# Patient Record
Sex: Male | Born: 1935 | Race: White | Hispanic: No | Marital: Single | Smoking: Former smoker
Health system: Southern US, Community
[De-identification: ages and names within clinical notes are randomized; demographics above are authoritative.]

## PROBLEM LIST (undated history)

## (undated) DIAGNOSIS — L129 Pemphigoid, unspecified: Secondary | ICD-10-CM

## (undated) DIAGNOSIS — F419 Anxiety disorder, unspecified: Secondary | ICD-10-CM

## (undated) DIAGNOSIS — Z9889 Other specified postprocedural states: Secondary | ICD-10-CM

## (undated) DIAGNOSIS — M1990 Unspecified osteoarthritis, unspecified site: Secondary | ICD-10-CM

## (undated) DIAGNOSIS — R600 Localized edema: Secondary | ICD-10-CM

## (undated) DIAGNOSIS — R112 Nausea with vomiting, unspecified: Secondary | ICD-10-CM

## (undated) DIAGNOSIS — R002 Palpitations: Secondary | ICD-10-CM

## (undated) DIAGNOSIS — I10 Essential (primary) hypertension: Secondary | ICD-10-CM

## (undated) DIAGNOSIS — I499 Cardiac arrhythmia, unspecified: Secondary | ICD-10-CM

## (undated) DIAGNOSIS — I493 Ventricular premature depolarization: Secondary | ICD-10-CM

## (undated) DIAGNOSIS — M4800 Spinal stenosis, site unspecified: Secondary | ICD-10-CM

## (undated) DIAGNOSIS — N400 Enlarged prostate without lower urinary tract symptoms: Secondary | ICD-10-CM

## (undated) DIAGNOSIS — M47816 Spondylosis without myelopathy or radiculopathy, lumbar region: Secondary | ICD-10-CM

## (undated) HISTORY — DX: Benign prostatic hyperplasia without lower urinary tract symptoms: N40.0

## (undated) HISTORY — DX: Cardiac arrhythmia, unspecified: I49.9

## (undated) HISTORY — DX: Palpitations: R00.2

## (undated) HISTORY — DX: Anxiety disorder, unspecified: F41.9

## (undated) HISTORY — DX: Localized edema: R60.0

## (undated) HISTORY — DX: Unspecified osteoarthritis, unspecified site: M19.90

## (undated) HISTORY — PX: JOINT REPLACEMENT: SHX530

## (undated) HISTORY — DX: Spondylosis without myelopathy or radiculopathy, lumbar region: M47.816

## (undated) HISTORY — DX: Pemphigoid, unspecified: L12.9

## (undated) HISTORY — DX: Ventricular premature depolarization: I49.3

## (undated) HISTORY — DX: Spinal stenosis, site unspecified: M48.00

---

## 1998-05-01 ENCOUNTER — Ambulatory Visit (HOSPITAL_COMMUNITY): Admission: RE | Admit: 1998-05-01 | Discharge: 1998-05-01 | Payer: Self-pay | Admitting: Family Medicine

## 1999-08-08 ENCOUNTER — Encounter: Payer: Self-pay | Admitting: Family Medicine

## 1999-08-08 ENCOUNTER — Encounter: Admission: RE | Admit: 1999-08-08 | Discharge: 1999-08-08 | Payer: Self-pay | Admitting: Family Medicine

## 2000-05-04 ENCOUNTER — Encounter: Payer: Self-pay | Admitting: Family Medicine

## 2000-05-04 ENCOUNTER — Encounter: Admission: RE | Admit: 2000-05-04 | Discharge: 2000-05-04 | Payer: Self-pay | Admitting: Family Medicine

## 2000-12-09 ENCOUNTER — Encounter: Admission: RE | Admit: 2000-12-09 | Discharge: 2000-12-09 | Payer: Self-pay | Admitting: Family Medicine

## 2000-12-09 ENCOUNTER — Encounter: Payer: Self-pay | Admitting: Family Medicine

## 2000-12-17 ENCOUNTER — Encounter: Admission: RE | Admit: 2000-12-17 | Discharge: 2001-02-02 | Payer: Self-pay | Admitting: Family Medicine

## 2005-02-23 ENCOUNTER — Inpatient Hospital Stay (HOSPITAL_COMMUNITY): Admission: RE | Admit: 2005-02-23 | Discharge: 2005-02-26 | Payer: Self-pay | Admitting: Orthopedic Surgery

## 2005-05-18 HISTORY — PX: TOTAL KNEE ARTHROPLASTY: SHX125

## 2005-05-18 HISTORY — PX: TOTAL HIP ARTHROPLASTY: SHX124

## 2005-05-18 HISTORY — PX: TOTAL SHOULDER ARTHROPLASTY: SHX126

## 2005-08-10 ENCOUNTER — Inpatient Hospital Stay (HOSPITAL_COMMUNITY): Admission: RE | Admit: 2005-08-10 | Discharge: 2005-08-11 | Payer: Self-pay | Admitting: Orthopedic Surgery

## 2005-09-14 ENCOUNTER — Encounter: Admission: RE | Admit: 2005-09-14 | Discharge: 2005-09-14 | Payer: Self-pay | Admitting: Orthopedic Surgery

## 2005-10-04 ENCOUNTER — Encounter: Admission: RE | Admit: 2005-10-04 | Discharge: 2005-10-04 | Payer: Self-pay | Admitting: Orthopedic Surgery

## 2005-10-08 ENCOUNTER — Encounter: Admission: RE | Admit: 2005-10-08 | Discharge: 2005-10-08 | Payer: Self-pay | Admitting: Orthopedic Surgery

## 2005-10-18 ENCOUNTER — Encounter: Admission: RE | Admit: 2005-10-18 | Discharge: 2005-10-18 | Payer: Self-pay | Admitting: Family Medicine

## 2005-10-27 ENCOUNTER — Encounter: Admission: RE | Admit: 2005-10-27 | Discharge: 2005-10-27 | Payer: Self-pay | Admitting: Orthopedic Surgery

## 2006-01-04 ENCOUNTER — Inpatient Hospital Stay (HOSPITAL_COMMUNITY): Admission: RE | Admit: 2006-01-04 | Discharge: 2006-01-07 | Payer: Self-pay | Admitting: Orthopedic Surgery

## 2006-06-29 ENCOUNTER — Encounter: Admission: RE | Admit: 2006-06-29 | Discharge: 2006-06-29 | Payer: Self-pay | Admitting: Orthopedic Surgery

## 2006-06-30 ENCOUNTER — Encounter: Admission: RE | Admit: 2006-06-30 | Discharge: 2006-06-30 | Payer: Self-pay | Admitting: Orthopedic Surgery

## 2006-07-06 ENCOUNTER — Encounter: Admission: RE | Admit: 2006-07-06 | Discharge: 2006-07-06 | Payer: Self-pay | Admitting: Orthopedic Surgery

## 2006-07-15 ENCOUNTER — Encounter: Admission: RE | Admit: 2006-07-15 | Discharge: 2006-07-15 | Payer: Self-pay | Admitting: Orthopedic Surgery

## 2007-06-02 ENCOUNTER — Encounter: Admission: RE | Admit: 2007-06-02 | Discharge: 2007-06-03 | Payer: Self-pay | Admitting: Family Medicine

## 2007-06-21 ENCOUNTER — Encounter: Admission: RE | Admit: 2007-06-21 | Discharge: 2007-06-21 | Payer: Self-pay | Admitting: Orthopedic Surgery

## 2007-07-12 ENCOUNTER — Encounter: Admission: RE | Admit: 2007-07-12 | Discharge: 2007-07-12 | Payer: Self-pay | Admitting: Orthopedic Surgery

## 2007-08-01 ENCOUNTER — Encounter: Admission: RE | Admit: 2007-08-01 | Discharge: 2007-08-01 | Payer: Self-pay | Admitting: Orthopedic Surgery

## 2008-03-24 ENCOUNTER — Encounter: Admission: RE | Admit: 2008-03-24 | Discharge: 2008-03-24 | Payer: Self-pay | Admitting: Orthopedic Surgery

## 2008-03-29 ENCOUNTER — Encounter: Admission: RE | Admit: 2008-03-29 | Discharge: 2008-03-29 | Payer: Self-pay | Admitting: Orthopedic Surgery

## 2008-09-10 ENCOUNTER — Encounter (INDEPENDENT_AMBULATORY_CARE_PROVIDER_SITE_OTHER): Payer: Self-pay | Admitting: *Deleted

## 2009-12-05 ENCOUNTER — Encounter: Admission: RE | Admit: 2009-12-05 | Discharge: 2009-12-05 | Payer: Self-pay | Admitting: Orthopedic Surgery

## 2010-05-18 DIAGNOSIS — R002 Palpitations: Secondary | ICD-10-CM

## 2010-05-18 HISTORY — DX: Palpitations: R00.2

## 2010-05-20 ENCOUNTER — Encounter
Admission: RE | Admit: 2010-05-20 | Discharge: 2010-05-20 | Payer: Self-pay | Source: Home / Self Care | Attending: Internal Medicine | Admitting: Internal Medicine

## 2010-06-08 ENCOUNTER — Encounter: Payer: Self-pay | Admitting: Orthopedic Surgery

## 2010-08-11 ENCOUNTER — Other Ambulatory Visit: Payer: Self-pay | Admitting: Dermatology

## 2010-10-03 NOTE — Op Note (Signed)
NAME:  [name], HUISH NO.:  [id number]   MEDICAL RECORD NO.:  [id number]          PATIENT TYPE:  INP   LOCATION:  2899                         FACILITY:  [facility]   PHYSICIAN:  Elana Alm. Thurston Hole, M.D. DATE OF BIRTH:  23-Dec-1932   DATE OF PROCEDURE:  02/23/2005  DATE OF DISCHARGE:                                 OPERATIVE REPORT   PREOPERATIVE DIAGNOSIS:  Right knee DJD.   POSTOPERATIVE DIAGNOSIS:  Right knee DJD.   PROCEDURE:  1.  Right total knee replacement using cemented DePuy total knee system with      #4 cemented femur, #5 cemented tibia, with 10 mm polyethylene RP tibial      spacer and 38 mm polyethylene cemented patella.  2.  Right total knee computer assisted navigation.   SURGEON:  Elana Alm. Thurston Hole, M.D.   ASSISTANT:  Julien Girt, P.A.   ANESTHESIA:  General.   OPERATIVE TIME:  1 hour 35 minutes.   COMPLICATIONS:  None.   DESCRIPTION OF PROCEDURE:  Mr. Kudrna was brought to operating room on  February 23, 2005, placed operating table supine position. He had received a  femoral nerve block in the holding room by anesthesia for postoperative pain  control. After being placed under general anesthesia, he received Ancef 1  gram IV preoperatively for prophylaxis. His right knee was examined. Range  of motion from -5 to 125 degrees with moderate varus deformity.  Knee stable  ligamentous exam with normal patellar tracking.  He had a Foley catheter  placed under sterile conditions. His right leg was then prepped using  sterile DuraPrep and draped using sterile technique. Leg was exsanguinated  and a thigh tourniquet elevated to 365 mm. Initially through a 15 cm  longitudinal incision based over the patella, initial exposure was made.  Underlying subcutaneous tissues were incised in line with the skin incision.  The median arthrotomy was performed revealing an excessive amount of normal-  appearing joint fluid. The articular spaces were  inspected. He had grade 4  changes medially, grade 3 changes laterally and grade 3 and 4 changes in the  patellofemoral joint. The medial and lateral meniscal remnants were removed  as well as the anterior cruciate ligament. Osteophytes were removed from the  femoral condyles and tibial plateau.  At this point then a distal femoral  cut was made using intramedullary distal femoral guide resecting 11 mm off  the distal femur. This was made in the appropriate amount of rotation and  position. The distal femur was incised with assistance and it was found to  be a #4 size. The #4 cutting jig was placed and then these cuts were made.  These cuts were verified and found to be exact and satisfactory. After this  was done the proximal tibia was exposed. The tibial spines were removed with  an oscillating saw. Intramedullary drill drilled down the tibial canal for  placement of the proximal tibial cutting jig which was placed in the  appropriate amount of rotation and a proximal 6 mm cut was made based off  the medial or lower side. Again these  cuts verified and found to the exact  and accurate. The spacer blocks were placed, a 10 mm block placed in flexion  and in extension found to give excellent and exact balancing and stability.  After this was done then the #5 tibial base plate trial was placed. This was  found to give an excellent and perfect fit and the keel cut was made. The  box cutter for the PCL cutting device was placed and then the box cut was  made. After this was done then the #4 femoral trial was placed. A #5 tibial  base plate trial was placed with a 10 mm polyethylene RP tibial spacer the  knee was then reduced, taken through range of motion from 0 to 125 degrees  with excellent stability, excellent correction of his flexion and varus  deformities. The patella was incised. The depth of the patella was 26 mm, a  resurfacing 10 mm cut was made and three locking holes were placed for  the  38 mm patella trial which was placed. The knee taken through range of  motion, patellar tracking was found to be normal again with excellent  stability. At this point felt that all trial components were of excellent  size and stability. They were then removed.  The knee was then jet lavage  irrigated with 3 liters saline and then the proximal tibia was exposed and  the #5 tibial base plate with cement backing was hammered in position also  with an excellent fit with excess cement being removed from around the  edges. The #4 femoral component with cement backing was hammered into  position also with an excellent fit with excess cement being removed from  around the edges. A 10 mm polyethylene RP tibial spacer was placed on the  tibial base plate. The knee reduced, taken through range of motion 0 to 125  degrees with excellent stability and excellent correction of his flexion and  varus deformities. The 38 mm polyethylene cement backed patella was then  placed in its position and held there with a clamp. After the cement  hardened, patellofemoral tracking was evaluated and it was found to be  normal. At this point it was felt that all components were excellent size  fit and stability. The wound was further irrigated with saline.  Tourniquet  was released, hemostasis obtained with cautery. The arthrotomy was then  closed with #1 Ethibond suture over two medium Hemovac drains. Subcutaneous  tissues closed with 0 and 2-0 Vicryl.  Subcuticular layer closed with 4-0  Monocryl. Steri-Strips were applied. Sterile dressings were applied. The  Hemovac injected with 0.25% Marcaine with epinephrine and 4 milligrams  morphine. Long-leg splint applied and the patient awakened, extubated, taken  to recovery in stable condition. Needle and sponge counts correct x 2 at the  end of the case.      Robert A. Thurston Hole, M.D.  Electronically Signed     RAW/MEDQ  D:  02/23/2005  T:  02/23/2005  Job:   [id number]

## 2010-10-03 NOTE — Discharge Summary (Signed)
NAME:  [name], DISNEY NO.:  [id number]   MEDICAL RECORD NO.:  [id number]          PATIENT TYPE:  INP   LOCATION:  5037                         FACILITY:  [facility]   PHYSICIAN:  Elana Alm. Thurston Hole, M.D. DATE OF BIRTH:  1932-11-21   DATE OF ADMISSION:  01/04/2006  DATE OF DISCHARGE:  01/07/2006                                 DISCHARGE SUMMARY   ADMITTING DIAGNOSES:  1. End-stage degenerative joint disease, right hip.  2. Hypertension.  3. Benign prostatic hypertrophy.   DISCHARGE DIAGNOSES:  1. End-stage degenerative joint disease.  2. Right hip hypertension.  3. Benign prostatic hypertrophy.  4. Status post shoulder replacement.  5. Status post knee replacement.  6. Hypokalemia.   HISTORY OF PRESENT ILLNESS:  The patient is a 77 year old white male with a  history of end-stage DJD of his right hip.  He has failed conservative care  including interarticular cortisone injections, anti-inflammatories, and  rest.  He understands the risks, benefits, and possible complications of a  right total hip replacement and is without question.   PROCEDURES IN-HOUSE:  On January 05, 2006, the patient underwent a right  total hip replacement by Dr. Thurston Hole.  He tolerated the procedure well and  was admitted postoperatively for pain control, DVT prophylaxis, and physical  therapy.  Post-op  day #1, he complained of a sore throat was given Chloraseptic  lozenges.  His hemoglobin was 11.6.  He was metabolically stable.  He was  afebrile.  His Foley was discontinued.  He was started on Cipro 500 mg one  p.o. b.i.d. for 14 days to control his chronic prostatitis, started on  Flomax 0.4 mg daily and his IV was saline locked.  Post-op, he did well with  physical therapy at 50% weightbearing.  Post-op day #2, sodium was 131, potassium was 2.9.  His IV was discontinued.  His potassium was replaced.  He had a KUB done.  He was placed on clear  liquids due to his decreased bowel sounds.   He was given a Dulcolax  suppository and given potassium 40 mEq p.o. b.i.d.  His Percocet was  discontinue.  Post-op day #3, he was able to have a bowel movement.  He had no ileus.  Hemoglobin was 9.1.  He was metabolically stable with a potassium of 3.7, a  sodium of 133.  He still had a large amount of serosanguineous drainage.  He  had a pressure dressing placed to his hip with instructions to change it  daily.   He was given:  1. Tylenol for pain.  2. Placed on doxycycline 100 mg one p.o. b.i.d. for drainage at his hip.  3. Robaxin 500 mg one every four to six hours p.r.n. muscle spasm.  4. Coumadin for DVT prophylaxis.  5. Toprol XL 100 mg daily.  6. Quinaretic 20/12.5 two tablets in the morning.  7. Norvasc 5 mg daily.  8. Aspirin 81 mg daily.  9. Colace 100 mg twice a day as needed for stool softener.  10.Flomax 0.4 mg daily.   He has been instructed to not take his Saw palmetto while he  is on Coumadin.   He will call with increased redness, increased drainage, increased swelling  or a temp greater than 101.   His follow-up appointment is on January 19, 2006.      Kirstin Shepperson, P.A.      Robert A. Thurston Hole, M.D.  Electronically Signed    KS/MEDQ  D:  02/24/2006  T:  02/26/2006  Job:  [id number]

## 2010-10-03 NOTE — Op Note (Signed)
NAME:  [name], MATUSZAK NO.:  [id number]   MEDICAL RECORD NO.:  [id number]          PATIENT TYPE:  INP   LOCATION:  5030                         FACILITY:  [facility]   PHYSICIAN:  Loreta Ave, M.D. DATE OF BIRTH:  11-08-1932   DATE OF PROCEDURE:  08/10/2005  DATE OF DISCHARGE:  08/11/2005                                 OPERATIVE REPORT   PREOPERATIVE DIAGNOSIS:  End stage degenerative arthritis left shoulder with  marked spurring and loss of motion and adhesive capsulitis.   POSTOPERATIVE DIAGNOSIS:  End stage degenerative arthritis left shoulder  with marked spurring and loss of motion and adhesive capsulitis, with intact  rotator cuff and previous subacromial decompression.   OPERATIVE PROCEDURE:  Left total shoulder replacement, Stryker Osteonix  prosthesis, cemented number 9 pegged glenoid component, press fit 15 mm  humeral component with a 50 by 21 mm humeral head.   SURGEON:  Loreta Ave, M.D.   ASSISTANT:  Elana Alm. Thurston Hole, M.D.  Genene Churn. Barry Dienes, P.A.-C.   ANESTHESIA:  General.   ESTIMATED BLOOD LOSS:  Minimal.   BLOOD REPLACED:  None.   SPECIMENS:  None.   CULTURES:  None.   COMPLICATIONS:  None.   DRESSINGS:  Soft compressive with shoulder immobilizer.   PROCEDURE:  The patient was brought to the operating room and after adequate  anesthesia had been obtained, placed in a beach chair position on the  shoulder positioner, prepped and draped in the usual sterile fashion.  The  arm is examined.  He had lost about 50% of his motion.  Gently manipulated  achieving about 80% of motion without pushing him too hard.  An incision  along the deltopectoral interval.  The skin and subcutaneous tissue divided.  Blunt dissection used to open the deltopectoral interval, rib retractors  were put in place, shoulder exposed.  Marked periarticular spurring that  could be palpated through the subscap and capsule.  The subscap was taken  down and tagged  with Ethibond and retracted medially exposing the shoulder.  Anterior spurs removed.  The humeral head was then removed with the  oscillating saw protecting inferior and superior structures.  It was cut at  30 degrees of retroversion.  The humeral head exposed.  The humeral head was  used to size for a definitive component.  All the periarticular spurs around  the humerus were removed back to a good position for definitive prosthesis.  Loose bodies removed.  The glenoid exposed, retractors put in place.  The  rotator cuff and biceps intact.  Periarticular spurs removed.  Sequential  reaming of the glenoid to a flat surface taking down mostly the front as it  had really eroded posteriorly.  Sized for a number 9 component.  Templates  put in place, four drill holes made for the pegged glenoid component.  Trial  put in place and found to fit well.  The wound was copiously irrigated.  Cement prepared and the humeral component was firmly cemented in place.  Once the cement hardened, it was tested and found to be well anchored and in  excellent position  with good coverage of the glenoid.  Attention was turned  to the humerus.  Hand held reamers opening up the shaft.  Sized for a 15 mm  component.  After appropriate trials, definitive component 15 mm was placed  down the shaft of the humerus.  The posterior keel in line with the biceps  and the prosthesis set at 30 degrees of retroversion.  After appropriate  trialing, the 50 by 21 mm head was chosen and was hammered in place.  Shoulder reduced.  Good capturing, alignment, fixation, stability, and  motion of the shoulder.  The wound irrigated.  Subscap was repaired  anatomically after a watertight closure of the cuff.  Retractors were  removed.  The deltopectoral interval and skin closed with Vicryl.  Margins  of the wound all thoroughly cleansed.  Steri-Strips applied.  Sterile  compressive dressing applied.  Shoulder immobilizer applied.   Anesthesia  reversed.  Brought to the recovery room.  Tolerated the surgery well without  complications.      Loreta Ave, M.D.  Electronically Signed     DFM/MEDQ  D:  08/10/2005  T:  08/11/2005  Job:  [id number]

## 2010-10-03 NOTE — Discharge Summary (Signed)
NAME:  [name], [name] NO.:  [id number]   MEDICAL RECORD NO.:  [id number]          PATIENT TYPE:  INP   LOCATION:  5035                         FACILITY:  [facility]   PHYSICIAN:  Elana Alm. Thurston Hole, M.D. DATE OF BIRTH:  19-Dec-1932   DATE OF ADMISSION:  02/23/2005  DATE OF DISCHARGE:  02/26/2005                                 DISCHARGE SUMMARY   ADMISSION DIAGNOSES:  1.  End-stage degenerative joint disease, right knee.  2.  Chronic prostatitis.  3.  Hypertension.   DISCHARGE DIAGNOSES:  1.  End-stage degenerative joint disease, right knee.  2.  Chronic prostatitis.  3.  Hypertension.  4.  Hypokalemia.   HISTORY OF PRESENT ILLNESS:  The patient is a 77 year old white male with a  history of bilateral knee end-stage DJD at this point in time his right is  more painful than left. He has failed conservative care including anti-  inflammatories, visco supplementation, and cortisone injections. He  understands the risks, benefits, and possible complications of right total  knee replacement and was without question.   PROCEDURES IN-HOUSE:  On March 15, 2005, the patient underwent a right  total knee replacement on Dr. Thurston Hole and a femoral nerve block by  anesthesia. He tolerated both procedures well. He was admitted  postoperatively for pain control, DVT prophylaxis, and physical therapy.   HOSPITAL COURSE:  On postoperative day one, the patient was doing well. He  had no complaints. His hemoglobin was 11.7, INR 1.2, sodium 131.  PCA was  discontinued. His IV was Hep locked. His Foley was discontinued. His drain  was pulled by Dr. Thurston Hole. His dressing was changed. He was helped with  physical therapy. On postoperative day two, the patient continued to  improve. His INR was 2.2. He was afebrile. His hemoglobin was 10.0.  His  sodium was 131. He had a moderate amount of drainage. His dressing was  changed. He is to continue with physical therapy. On postoperative  day  three, the patient continued to improve. His knee was moderately swollen.  His INR was 2.1. He was metabolically stable with a sodium of 131. His  hemoglobin was 9.5. He was discharged to home in stable condition, weight  bearing as tolerated.   DISCHARGE MEDICATIONS:  1.  Dilaudid 2 mg one to two q.4-6h. p.r.n.  2.  Accuretic daily.  3.  Norvasc 5 mg at night.  4.  Toprol XL 100 mg daily.  5.  Multivitamin daily.  6.  Coumadin 5 mg daily.   He has been instructed to use his CPM 0-90 degrees eight hours a day,  elevate his right heel on folded pillow every morning for 30 minutes. He is  to change his dressing daily. He is to call with increased redness,  increased drainage, and increased pain. He can shower on October 16th and  follow up with Dr. Thurston Hole on October 23rd.      Kirstin Shepperson, P.A.      Robert A. Thurston Hole, M.D.  Electronically Signed    KS/MEDQ  D:  04/27/2005  T:  04/27/2005  Job:  [id number]

## 2010-10-03 NOTE — Op Note (Signed)
NAME:  [name], [name] NO.:  [id number]   MEDICAL RECORD NO.:  [id number]          PATIENT TYPE:  INP   LOCATION:  5037                         FACILITY:  [facility]   PHYSICIAN:  Elana Alm. Thurston Hole, M.D. DATE OF BIRTH:  06-Jun-1932   DATE OF PROCEDURE:  01/05/2006  DATE OF DISCHARGE:                                 OPERATIVE REPORT   PREOPERATIVE DIAGNOSIS:  Right hip degenerative joint disease.   POSTOPERATIVE DIAGNOSIS:  Right hip degenerative joint disease.   PROCEDURE:  Right total hip replacement using S-ROM Press-Fit total hip  system with acetabulum 58 mm Pinnacle, Sector Press-Fit cup with two locking  screws and 58 mm metal-on-metal liner.  Femoral component 20 x 15 femoral  stem with 36 +12 offset with 20D large sleeve with 40 mm -3 SROM metal head.   SURGEON:  Elana Alm. Thurston Hole, M.D.   ASSISTANT:  Julien Girt, P.A.   ANESTHESIA:  General.   OPERATIVE TIME:  1 hour and 30 minutes.   ESTIMATED BLOOD LOSS:  350 mL.   COMPLICATIONS:  None.   DESCRIPTION OF PROCEDURE:  Mr. Desa was brought to operating room on  01/04/06, placed operative table supine position.  He received Ancef 2 grams  IV preoperatively for prophylaxis.  After being placed under general  anesthesia he had a Foley catheter placed under sterile conditions.  His  right hip was examined.  He had flexion to 90, extension to 0, internal and  external rotation of 30 degrees with minimal leg length discrepancy.  He was  then placed in the right lateral up decubitus position and secured on the  bed with a Mark frame.  His right hip and leg was prepped using sterile  DuraPrep and draped using sterile technique.  Originally through a 15 cm  posterolateral greater trochanteric incision initial exposure was made.  Underlying subcutaneous tissues were incised in line with skin incision.  The iliotibial band and gluteus maximus fascia was incised longitudinally  revealing the underlying  sciatic nerve which was carefully protected.  The  short external rotators of the hip and hip capsule were released off the  femoral neck insertion and tagged.  Hip was posteriorly dislocated.  There  was significant amount of synovial fluid which was removed.  Femoral head  showed grade 3 and 4 DJD and chondromalacia.  Femoral neck cut was then made  2 cm above the lesser trochanter using the SROM template.  The acetabulum  was then exposed.  Found to have grade 3 and 4 chondromalacia and DJD as  well on the acetabulum.  Carefully placed acetabular retractors were placed.  Degenerative acetabular labrum was resected.  Sequential acetabular reaming  was then carried out up to a 57 mm size reaming and the appropriate amount  of anteversion, abduction and inclination and then a 58 mm trial acetabulum  was placed with an excellent fit.  It was then removed and the actual 58 mm  sector cup was hammered into position in the appropriate amount of  anteversion, abduction and inclination with an excellent fit.  It was  further secured in  place with two 20 mm screws, one in the 12 o'clock and  one in the 1 o'clock position.  After this was done then the 58 mm metal on  metal liner was placed in the sector shell and hammered into position with  an excellent fit.  At this point the proximal femur was exposed.  Sequential  distal reaming was carried out up to a 15.5 mm size and proximal reaming up  to a 20 mm size.  After this was done, then the calcar reaming was carried  out to a D large reaming and then the 20 D large sleeve trial was placed  with an excellent fit and with the 20 x 15 femoral stem trial placed in  position, a 40 mm -3 trial head was placed.  The hip reduced, taken through  full range of motion, found to be stable and leg lengths equal up.  At this  point the femoral trial was removed.  Femoral canal copiously irrigated and  then the actual 20 D large sleeve was hammered into position  with an  excellent fit.  The 20 x 15 femoral stem with a 36 +12 offset was hammered  into position as well with extra 10 degrees of anteversion placed and then  the 40 mm -3 SROM head was hammered on the femoral neck with an excellent  Morse taper fit.  The hip was then reduced, taken through full range of  motion, found to be stable up to 80 degrees of internal rotation in both  neutral and 30 degrees of adduction and also stable in abduction and  external rotation and leg lengths were equal.  At this point it is felt that  all components were of excellent size, fit and stability.  The wound was  further irrigated.  The short external rotators of the hip and hip capsule  were then reattached to their femoral neck insertions through two drill  holes in the greater trochanter.  Iliotibial band and gluteus maximus fascia  was closed with #1 Ethibond suture.  Subcutaneous tissues closed with 0 and  2-0 Vicryl, skin closed with skin staples.  Sterile dressings were applied.  The patient turned supine, checked for leg lengths that were equal.  Rotation was equal pulses 2+ and symmetric.  He was then awakened,  extubated, taken to recovery in stable condition.  Needle, sponge counts  correct x2 end of the case.      Robert A. Thurston Hole, M.D.  Electronically Signed     RAW/MEDQ  D:  01/05/2006  T:  01/05/2006  Job:  [id number]

## 2010-11-11 ENCOUNTER — Other Ambulatory Visit: Payer: Self-pay | Admitting: Geriatric Medicine

## 2010-11-11 DIAGNOSIS — M545 Low back pain: Secondary | ICD-10-CM

## 2010-11-16 ENCOUNTER — Ambulatory Visit
Admission: RE | Admit: 2010-11-16 | Discharge: 2010-11-16 | Disposition: A | Discharge status: Home / Self Care | Payer: Medicare Other | Source: Ambulatory Visit | Attending: Geriatric Medicine | Admitting: Geriatric Medicine

## 2010-11-16 DIAGNOSIS — M545 Low back pain: Secondary | ICD-10-CM

## 2010-12-17 ENCOUNTER — Other Ambulatory Visit: Payer: Self-pay | Admitting: Neurosurgery

## 2010-12-17 DIAGNOSIS — M549 Dorsalgia, unspecified: Secondary | ICD-10-CM

## 2010-12-18 ENCOUNTER — Other Ambulatory Visit: Payer: Self-pay | Admitting: Neurosurgery

## 2010-12-18 ENCOUNTER — Ambulatory Visit
Admission: RE | Admit: 2010-12-18 | Discharge: 2010-12-18 | Disposition: A | Discharge status: Home / Self Care | Payer: Medicare Other | Source: Ambulatory Visit | Attending: Neurosurgery | Admitting: Neurosurgery

## 2010-12-18 DIAGNOSIS — M549 Dorsalgia, unspecified: Secondary | ICD-10-CM

## 2010-12-18 MED ORDER — IOHEXOL 180 MG/ML  SOLN
1.0000 mL | Freq: Once | INTRAMUSCULAR | Status: AC | PRN
  Administered 2010-12-18: 1 mL via INTRA_ARTICULAR

## 2010-12-18 MED ORDER — METHYLPREDNISOLONE ACETATE 40 MG/ML INJ SUSP (RADIOLOG
120.0000 mg | Freq: Once | INTRAMUSCULAR | Status: AC
  Administered 2010-12-18: 120 mg via INTRA_ARTICULAR

## 2011-01-17 HISTORY — PX: BACK SURGERY: SHX140

## 2011-01-26 ENCOUNTER — Other Ambulatory Visit: Payer: Self-pay | Admitting: Orthopedic Surgery

## 2011-01-26 DIAGNOSIS — IMO0002 Reserved for concepts with insufficient information to code with codable children: Secondary | ICD-10-CM

## 2011-01-27 ENCOUNTER — Ambulatory Visit
Admission: RE | Admit: 2011-01-27 | Discharge: 2011-01-27 | Disposition: A | Discharge status: Home / Self Care | Payer: Medicare Other | Source: Ambulatory Visit | Attending: Orthopedic Surgery | Admitting: Orthopedic Surgery

## 2011-01-27 ENCOUNTER — Other Ambulatory Visit: Payer: Self-pay | Admitting: Orthopedic Surgery

## 2011-01-27 DIAGNOSIS — IMO0002 Reserved for concepts with insufficient information to code with codable children: Secondary | ICD-10-CM

## 2011-01-27 MED ORDER — IOHEXOL 180 MG/ML  SOLN
1.0000 mL | Freq: Once | INTRAMUSCULAR | Status: AC | PRN
  Administered 2011-01-27: 1 mL via EPIDURAL

## 2011-01-27 MED ORDER — METHYLPREDNISOLONE ACETATE 40 MG/ML INJ SUSP (RADIOLOG
120.0000 mg | Freq: Once | INTRAMUSCULAR | Status: AC
  Administered 2011-01-27: 120 mg via EPIDURAL

## 2011-02-04 ENCOUNTER — Other Ambulatory Visit (HOSPITAL_COMMUNITY): Payer: Medicare Other

## 2011-02-10 ENCOUNTER — Inpatient Hospital Stay (HOSPITAL_COMMUNITY): Admission: RE | Admit: 2011-02-10 | Payer: Medicare Other | Source: Ambulatory Visit | Admitting: Orthopedic Surgery

## 2011-02-11 ENCOUNTER — Other Ambulatory Visit (HOSPITAL_COMMUNITY): Payer: Self-pay | Admitting: Neurosurgery

## 2011-02-11 ENCOUNTER — Encounter (HOSPITAL_COMMUNITY)
Admission: RE | Admit: 2011-02-11 | Discharge: 2011-02-11 | Disposition: A | Discharge status: Home / Self Care | Payer: Medicare Other | Source: Ambulatory Visit | Attending: Neurosurgery | Admitting: Neurosurgery

## 2011-02-11 DIAGNOSIS — M48061 Spinal stenosis, lumbar region without neurogenic claudication: Secondary | ICD-10-CM

## 2011-02-11 LAB — CBC
HCT: 41 % (ref 39.0–52.0)
MCHC: 36.3 g/dL — ABNORMAL HIGH (ref 30.0–36.0)
MCV: 94.5 fL (ref 78.0–100.0)
RDW: 12.6 % (ref 11.5–15.5)

## 2011-02-11 LAB — BASIC METABOLIC PANEL
BUN: 16 mg/dL (ref 6–23)
CO2: 32 mEq/L (ref 19–32)
Chloride: 93 mEq/L — ABNORMAL LOW (ref 96–112)
Creatinine, Ser: 0.9 mg/dL (ref 0.50–1.35)

## 2011-02-11 LAB — SURGICAL PCR SCREEN
MRSA, PCR: NEGATIVE
Staphylococcus aureus: NEGATIVE

## 2011-02-17 ENCOUNTER — Inpatient Hospital Stay (HOSPITAL_COMMUNITY)
Admission: RE | Admit: 2011-02-17 | Discharge: 2011-02-17 | DRG: 491 | Disposition: A | Discharge status: Home / Self Care | Payer: Medicare Other | Source: Ambulatory Visit | Attending: Neurosurgery | Admitting: Neurosurgery

## 2011-02-17 ENCOUNTER — Inpatient Hospital Stay (HOSPITAL_COMMUNITY): Payer: Medicare Other

## 2011-02-17 DIAGNOSIS — I10 Essential (primary) hypertension: Secondary | ICD-10-CM | POA: Diagnosis present

## 2011-02-17 DIAGNOSIS — Z01812 Encounter for preprocedural laboratory examination: Secondary | ICD-10-CM

## 2011-02-17 DIAGNOSIS — M7130 Other bursal cyst, unspecified site: Secondary | ICD-10-CM | POA: Diagnosis present

## 2011-02-17 DIAGNOSIS — M48061 Spinal stenosis, lumbar region without neurogenic claudication: Principal | ICD-10-CM | POA: Diagnosis present

## 2011-03-03 NOTE — Op Note (Signed)
  NAME:  [name], [name] NO.:  [id number]  MEDICAL RECORD NO.:  [id number]  LOCATION:  3526                         FACILITY:  [facility]  PHYSICIAN:  Kathaleen Maser. Bharat Antillon, M.D.    DATE OF BIRTH:  November 25, 1932  DATE OF PROCEDURE:  02/17/2011 DATE OF DISCHARGE:  02/17/2011                              OPERATIVE REPORT   PREOPERATIVE DIAGNOSIS:  L3-4 stenosis.  POSTOPERATIVE DIAGNOSIS:  L3-4 stenosis.  PROCEDURE:  Bilateral L3-4 decompressive laminotomies with bilateral L3- L4 decompressive foraminotomies.  Resection of right L3-4 synovial cyst.  SURGEON:  Jerni Selmer A. Glyndon Tursi, MD  ASSISTANT:  None.  ANESTHESIA:  General endotracheal.  INDICATIONS:  [name] is a 76 year old male with history of back and bilateral lower extremity pain.  Workup demonstrates evidence of marked facet arthropathy with an associated synovial cyst and severe stenosis at L3-4.  The patient presents now for decompressive laminotomy and resection of synovial cyst.  OPERATIVE NOTE:  The patient was taken to the operating room and placed on the operative table in supine position.  After adequate level of anesthesia was achieved, the patient was positioned prone on Wilson frame and appropriately padded.  The patient's lumbar region was prepped and draped sterilely.  A 10-blade was used to make a curvilinear skin incision overlying the L3-4 interspace.  This was carried down sharply in the midline.  A subperiosteal dissection was performed exposing the lamina facet joints of L3 and L4.  Deep self-retaining retractor was placed.  Intraoperative x-rays were taken and the levels were confirmed. Decompressive laminotomies were then performed using high-speed drill and Kerrison rongeurs to remove the inferior aspect of the lamina of L3, medial aspect of the L3-4 facet joint, and superior rim of the L4. Ligamentum flavum was then elevated and resected in a piecemeal fashion using Kerrison rongeurs.  The  underlying thecal sac and exiting L3-L4 nerve roots were identified bilaterally.  Wide decompressive foraminotomies were then performed along the course of exiting nerve roots bilaterally.  Synovial cyst on the right side, which was densely adherent to the underlying thecal sac and L4 nerve root was dissected free and resected completely.  There was no evidence of any injury to thecal sac or nerve roots.  There was no evidence of any residual compression upon the thecal sac or nerve roots.  Wound was then irrigated with antibiotic solution.  Gelfoam was placed topically for hemostasis and found to be good.  Microscope and retractor system were removed.  Hemostasis was achieved with electrocautery.  The wound was closed in layers with Vicryl sutures.  A medium Hemovac drain was left in the epidural space.  Steri-Strips and sterile dressings were applied. There were no complications.  The patient tolerated the procedure well and he returned to the recovery room postoperatively.          ______________________________ Kathaleen Maser Ouida Abeyta, M.D.   HAP/MEDQ  D:  02/17/2011  T:  02/17/2011  Job:  [id number]  Electronically Signed by Julio Sicks M.D. on 03/03/2011 11:37:01 AM

## 2011-04-07 ENCOUNTER — Other Ambulatory Visit: Payer: Self-pay

## 2011-04-07 ENCOUNTER — Emergency Department (HOSPITAL_COMMUNITY): Payer: Medicare Other

## 2011-04-07 ENCOUNTER — Encounter: Payer: Self-pay | Admitting: Emergency Medicine

## 2011-04-07 ENCOUNTER — Emergency Department (HOSPITAL_COMMUNITY)
Admission: EM | Admit: 2011-04-07 | Discharge: 2011-04-07 | Disposition: A | Discharge status: Home / Self Care | Payer: Medicare Other | Attending: Internal Medicine | Admitting: Internal Medicine

## 2011-04-07 DIAGNOSIS — N400 Enlarged prostate without lower urinary tract symptoms: Secondary | ICD-10-CM | POA: Diagnosis present

## 2011-04-07 DIAGNOSIS — M1990 Unspecified osteoarthritis, unspecified site: Secondary | ICD-10-CM

## 2011-04-07 DIAGNOSIS — R002 Palpitations: Secondary | ICD-10-CM | POA: Insufficient documentation

## 2011-04-07 DIAGNOSIS — I10 Essential (primary) hypertension: Secondary | ICD-10-CM

## 2011-04-07 DIAGNOSIS — R000 Tachycardia, unspecified: Secondary | ICD-10-CM | POA: Diagnosis present

## 2011-04-07 HISTORY — DX: Essential (primary) hypertension: I10

## 2011-04-07 LAB — URINALYSIS, ROUTINE W REFLEX MICROSCOPIC
Glucose, UA: NEGATIVE mg/dL
Hgb urine dipstick: NEGATIVE
Leukocytes, UA: NEGATIVE
Protein, ur: NEGATIVE mg/dL
Specific Gravity, Urine: 1.01 (ref 1.005–1.030)
Urobilinogen, UA: 0.2 mg/dL (ref 0.0–1.0)

## 2011-04-07 LAB — POCT I-STAT 3, ART BLOOD GAS (G3+)
Bicarbonate: 23.5 mEq/L (ref 20.0–24.0)
pH, Arterial: 7.44 (ref 7.350–7.450)
pO2, Arterial: 84 mmHg (ref 80.0–100.0)

## 2011-04-07 LAB — COMPREHENSIVE METABOLIC PANEL
ALT: 29 U/L (ref 0–53)
Alkaline Phosphatase: 81 U/L (ref 39–117)
CO2: 28 mEq/L (ref 19–32)
GFR calc non Af Amer: 85 mL/min — ABNORMAL LOW (ref >90)
Glucose, Bld: 136 mg/dL — ABNORMAL HIGH (ref 70–99)
Potassium: 3.6 mEq/L (ref 3.5–5.1)
Sodium: 135 mEq/L (ref 135–145)
Total Protein: 7.2 g/dL (ref 6.0–8.3)

## 2011-04-07 LAB — CBC
Hemoglobin: 13.7 g/dL (ref 13.0–17.0)
RBC: 4.06 MIL/uL — ABNORMAL LOW (ref 4.22–5.81)
WBC: 8.1 10*3/uL (ref 4.0–10.5)

## 2011-04-07 LAB — CARDIAC PANEL(CRET KIN+CKTOT+MB+TROPI)
CK, MB: 4.6 ng/mL — ABNORMAL HIGH (ref 0.3–4.0)
Relative Index: INVALID (ref 0.0–2.5)
Total CK: 87 U/L (ref 7–232)
Troponin I: <0.3 ng/mL (ref <0.30)

## 2011-04-07 LAB — TSH: TSH: 0.652 u[IU]/mL (ref 0.350–4.500)

## 2011-04-07 LAB — MAGNESIUM: Magnesium: 1.9 mg/dL (ref 1.5–2.5)

## 2011-04-07 MED ORDER — METOPROLOL TARTRATE 25 MG PO TABS
25.0000 mg | ORAL_TABLET | Freq: Once | ORAL | Status: AC
  Administered 2011-04-07: 25 mg via ORAL
  Filled 2011-04-07 (×2): qty 1

## 2011-04-07 MED ORDER — CLONIDINE HCL 0.1 MG PO TABS
0.1000 mg | ORAL_TABLET | Freq: Once | ORAL | Status: AC
  Administered 2011-04-07: 0.1 mg via ORAL
  Filled 2011-04-07: qty 1

## 2011-04-07 MED ORDER — METOPROLOL SUCCINATE ER 100 MG PO TB24
100.0000 mg | ORAL_TABLET | Freq: Every day | ORAL | Status: DC
  Administered 2011-04-07: 100 mg via ORAL
  Filled 2011-04-07 (×2): qty 1

## 2011-04-07 MED ORDER — IOHEXOL 350 MG/ML SOLN
80.0000 mL | Freq: Once | INTRAVENOUS | Status: AC | PRN
  Administered 2011-04-07: 80 mL via INTRAVENOUS

## 2011-04-07 MED ORDER — METOPROLOL SUCCINATE ER 50 MG PO TB24: 50.0000 mg | ORAL_TABLET | Freq: Every day | ORAL | Status: DC

## 2011-04-07 MED ORDER — SODIUM CHLORIDE 0.9 % IV BOLUS (SEPSIS)
500.0000 mL | Freq: Once | INTRAVENOUS | Status: AC
  Administered 2011-04-07: 500 mL via INTRAVENOUS

## 2011-04-07 NOTE — ED Notes (Signed)
Pt resting & has no needs at this time

## 2011-04-07 NOTE — ED Notes (Signed)
Patient ax4 Calm cooperative resting comfortably on stretcher call light in place.

## 2011-04-07 NOTE — H&P (Signed)
Hospital Admission Note Date: 04/07/2011  Patient name: [name] Medical record number: [id number] Date of birth: [birth year] Age: 77 y.o. Gender: male PCP: No primary provider on file.  Chief Complaint: Palpitations   History of Present Illness: [name] is a pleasant 77 year-old gentleman who called EMS this morning around 5 AM because of severe palpitations.  He went to the bathroom and felt palpitations.  The first time he went back to sleep. The second time the palpitations were much stronger, harder. He said he felt the pounding in his ears and he knew something was wrong.  He denies any chest or chest discomfort.  He denies any shortness of breath . No headache.  He has never had this happen before. The patient's primary care physician is Dr. Pete [name]  Meds:   (Not in a hospital admission) Allergies: Hydrocodone Past Medical History  Diagnosis Date  . Hypertension    Patient Active Problem List  Diagnoses  . Tachycardia  . Hypertension  . BPH (benign prostatic hyperplasia)  . DJD (degenerative joint disease)   H/o Right THR,  Right TKR, L3-L4 Laminotomy 02/2011, Left shoulder replacement   History   Social History  . Marital Status: Single    Spouse Name: N/A    Number of Children: N/A  . Years of Education: N/A   Occupational History  . Not on file.   Social History Main Topics  . Smoking status: Not on file  . Smokeless tobacco: Not on file  . Alcohol Use:   . Drug Use:   . Sexually Active:    Other Topics Concern  . Not on file   Social History Narrative  . No narrative on file   Review of Systems:Review of Systems  Constitutional: Negative.   HENT: Negative.   Respiratory: Negative for cough, hemoptysis, sputum production and shortness of breath.   Cardiovascular: Positive for palpitations and leg swelling. Negative for chest pain and orthopnea.  Gastrointestinal: Positive for constipation. Negative for heartburn, nausea, vomiting,  abdominal pain and diarrhea.  Genitourinary: Negative for dysuria, urgency, frequency, hematuria and flank pain.  Musculoskeletal: Positive for back pain and joint pain. Negative for myalgias.  Skin: Negative.   Neurological: Negative for tingling, tremors and seizures.  Endo/Heme/Allergies: Negative for environmental allergies and polydipsia. Does not bruise/bleed easily.  Psychiatric/Behavioral: Negative for memory loss and substance abuse.    Physical Exam:Physical Exam  Constitutional: He is oriented to person, place, and time. He appears well-developed and well-nourished. No distress.  HENT:  Head: Normocephalic and atraumatic.  Mouth/Throat: No oropharyngeal exudate.  Eyes: Conjunctivae and EOM are normal. Pupils are equal, round, and reactive to light.  Neck: Normal range of motion. No JVD present. No tracheal deviation present. No thyromegaly present.  Cardiovascular: Regular rhythm.  Exam reveals no gallop and no friction rub.   No murmur heard.      tachy  Pulmonary/Chest: Effort normal and breath sounds normal. No stridor. No respiratory distress. He has no wheezes. He has no rales. He exhibits no tenderness.  Abdominal: Soft. Bowel sounds are normal. He exhibits no distension and no mass. There is no tenderness. There is no rebound and no guarding.  Musculoskeletal: He exhibits no edema and no tenderness.  Lymphadenopathy:    He has no cervical adenopathy.  Neurological: He is alert and oriented to person, place, and time. No cranial nerve deficit. He exhibits normal muscle tone. Coordination normal.  Skin: Skin is warm and dry. No rash noted. He is  not diaphoretic. No erythema. No pallor.  Psychiatric: He has a normal mood and affect. His behavior is normal. Judgment and thought content normal.   Lab results: Results for orders placed during the hospital encounter of 04/07/11  CBC      Component Value Range   WBC 8.1  4.0 - 10.5 (K/uL)   RBC 4.06 (*) 4.22 - 5.81 (MIL/uL)    Hemoglobin 13.7  13.0 - 17.0 (g/dL)   HCT 16.1  09.6 - 04.5 (%)   MCV 96.1  78.0 - 100.0 (fL)   MCH 33.7  26.0 - 34.0 (pg)   MCHC 35.1  30.0 - 36.0 (g/dL)   RDW 40.9  81.1 - 91.4 (%)   Platelets 241  150 - 400 (K/uL)  COMPREHENSIVE METABOLIC PANEL      Component Value Range   Sodium 135  135 - 145 (mEq/L)   Potassium 3.6  3.5 - 5.1 (mEq/L)   Chloride 97  96 - 112 (mEq/L)   CO2 28  19 - 32 (mEq/L)   Glucose, Bld 136 (*) 70 - 99 (mg/dL)   BUN 13  6 - 23 (mg/dL)   Creatinine, Ser 7.82  0.50 - 1.35 (mg/dL)   Calcium 9.3  8.4 - 95.6 (mg/dL)   Total Protein 7.2  6.0 - 8.3 (g/dL)   Albumin 3.7  3.5 - 5.2 (g/dL)   AST 23  0 - 37 (U/L)   ALT 29  0 - 53 (U/L)   Alkaline Phosphatase 81  39 - 117 (U/L)   Total Bilirubin 0.4  0.3 - 1.2 (mg/dL)   GFR calc non Af Amer 85 (*) >90 (mL/min)   GFR calc Af Amer >90  >90 (mL/min)  URINALYSIS, ROUTINE W REFLEX MICROSCOPIC      Component Value Range   Color, Urine YELLOW  YELLOW    Appearance CLEAR  CLEAR    Specific Gravity, Urine 1.010  1.005 - 1.030    pH 7.0  5.0 - 8.0    Glucose, UA NEGATIVE  NEGATIVE (mg/dL)   Hgb urine dipstick NEGATIVE  NEGATIVE    Bilirubin Urine NEGATIVE  NEGATIVE    Ketones, ur NEGATIVE  NEGATIVE (mg/dL)   Protein, ur NEGATIVE  NEGATIVE (mg/dL)   Urobilinogen, UA 0.2  0.0 - 1.0 (mg/dL)   Nitrite NEGATIVE  NEGATIVE    Leukocytes, UA NEGATIVE  NEGATIVE   POCT I-STAT TROPONIN I      Component Value Range   Troponin i, poc 0.00  0.00 - 0.08 (ng/mL)   Comment 3             Imaging results:  Ct Angio Chest W/cm &/or Wo Cm  04/07/2011  *RADIOLOGY REPORT*  Clinical Data:  Shortness of breath, chest pain, tachycardia, evaluate for PE  CHEST CT WITHOUT CONTRAST  Technique:  Multidetector CT imaging of the chest was performed following the standard protocol without intravenous contrast.  Comparison:  Chest radiograph dated 04/07/2011  Findings:  No evidence of pulmonary embolism.  Moderate centrilobular emphysematous  changes.  Elevation of the right hemidiaphragm with mild right lower lobe atelectasis.  No suspicious pulmonary nodules.  No pleural effusion or pneumothorax.  Visualized thyroid is mildly heterogeneous.  The heart is normal in size.  No pericardial effusion.  Coronary atherosclerosis.  Atherosclerotic calcifications of the aortic arch.  Visualized upper abdomen is notable for calcified splenic granulomata.  Mild degenerative changes of the visualized thoracolumbar spine. Benign appearing lytic lesion in the T11 vertebral body, unchanged  from prior CT abdomen pelvis.  IMPRESSION: No evidence of pulmonary embolism.  Moderate centrilobular emphysematous changes.  Original Report Authenticated By: Charline Bills, M.D.   Dg Chest Port 1 View  04/07/2011  *RADIOLOGY REPORT*  Clinical Data: Tachycardia and hypertension.  PORTABLE CHEST - 1 VIEW  Comparison: 02/11/2011  Findings: Chronic elevation of the right hemidiaphragm is stable with associated volume loss.  No edema or focal parenchymal consolidation.  No pleural effusion.  The heart size is within normal limits.  IMPRESSION: No active disease.  Stable chronic elevation of the right hemidiaphragm.  Original Report Authenticated By: Reola Calkins, M.D.   Other results  Results for orders placed during the hospital encounter of 04/07/11 (from the past 24 hour(s))  URINALYSIS, ROUTINE W REFLEX MICROSCOPIC     Status: Normal   Collection Time   04/07/11  7:08 AM      Component Value Range   Color, Urine YELLOW  YELLOW    Appearance CLEAR  CLEAR    Specific Gravity, Urine 1.010  1.005 - 1.030    pH 7.0  5.0 - 8.0    Glucose, UA NEGATIVE  NEGATIVE (mg/dL)   Hgb urine dipstick NEGATIVE  NEGATIVE    Bilirubin Urine NEGATIVE  NEGATIVE    Ketones, ur NEGATIVE  NEGATIVE (mg/dL)   Protein, ur NEGATIVE  NEGATIVE (mg/dL)   Urobilinogen, UA 0.2  0.0 - 1.0 (mg/dL)   Nitrite NEGATIVE  NEGATIVE    Leukocytes, UA NEGATIVE  NEGATIVE   CBC     Status:  Abnormal   Collection Time   04/07/11  7:13 AM      Component Value Range   WBC 8.1  4.0 - 10.5 (K/uL)   RBC 4.06 (*) 4.22 - 5.81 (MIL/uL)   Hemoglobin 13.7  13.0 - 17.0 (g/dL)   HCT 16.1  09.6 - 04.5 (%)   MCV 96.1  78.0 - 100.0 (fL)   MCH 33.7  26.0 - 34.0 (pg)   MCHC 35.1  30.0 - 36.0 (g/dL)   RDW 40.9  81.1 - 91.4 (%)   Platelets 241  150 - 400 (K/uL)  COMPREHENSIVE METABOLIC PANEL     Status: Abnormal   Collection Time   04/07/11  7:13 AM      Component Value Range   Sodium 135  135 - 145 (mEq/L)   Potassium 3.6  3.5 - 5.1 (mEq/L)   Chloride 97  96 - 112 (mEq/L)   CO2 28  19 - 32 (mEq/L)   Glucose, Bld 136 (*) 70 - 99 (mg/dL)   BUN 13  6 - 23 (mg/dL)   Creatinine, Ser 7.82  0.50 - 1.35 (mg/dL)   Calcium 9.3  8.4 - 95.6 (mg/dL)   Total Protein 7.2  6.0 - 8.3 (g/dL)   Albumin 3.7  3.5 - 5.2 (g/dL)   AST 23  0 - 37 (U/L)   ALT 29  0 - 53 (U/L)   Alkaline Phosphatase 81  39 - 117 (U/L)   Total Bilirubin 0.4  0.3 - 1.2 (mg/dL)   GFR calc non Af Amer 85 (*) >90 (mL/min)   GFR calc Af Amer >90  >90 (mL/min)  POCT I-STAT TROPONIN I     Status: Normal   Collection Time   04/07/11  7:21 AM      Component Value Range   Troponin i, poc 0.00  0.00 - 0.08 (ng/mL)   Comment 3  patient's EKG reveals what is probably sinus tachycardia with rate of 125.  he has no ST segment elevation or depression. I personally reviewed the EKG.  Assessment & Plan by Problem:  #1 tachycardia this EKG is either sinus tachycardia or a letter will have cardiologist review it. The meantime we'll go ahead and increase his beta blocker to control his heart rate. An echocardiogram she has not had any chest pain or discomfort at all first troponin was negative sightless enzymes and she'll be monitored overnight. #2 hypertension his blood pressure is elevated. We will be increasing his beta blocker he has not yet received his daily blood pressure medications and it is now 3pm.   He has received his  Clonidine and Toprol.   Once we increase the beta blocker dose depending on his blood pressure will go ahead and add back his amlodipine and Accupril. Patient Active Problem List  Diagnoses  . Tachycardia  . Hypertension  . BPH (benign prostatic hyperplasia)  . DJD (degenerative joint disease)     Code status:  full  Greater than 70 minutes was spent on direct patient care and coordination of care.  Please fax this note to primary provider  Murrell Elizondo 04/07/2011, 2:32 PM

## 2011-04-07 NOTE — ED Notes (Signed)
Cardiology at bedside.

## 2011-04-07 NOTE — ED Notes (Signed)
Emptied 450cc; pt used an urinal

## 2011-04-07 NOTE — ED Notes (Signed)
Received report from [place], [place]; introduced self; pt watching tv and talking on cell phone

## 2011-04-07 NOTE — ED Notes (Signed)
Introduced self to pt. Pt resting and voiced no needs at this time.

## 2011-04-07 NOTE — ED Provider Notes (Signed)
History     CSN: [id number] Arrival date & time: 04/07/2011  6:35 AM   First MD Initiated Contact with Patient 04/07/11 [phone]      Chief Complaint  Patient presents with  . Palpitations    woke up this morning  . Hypertension    (Consider location/radiation/quality/duration/timing/severity/associated sxs/prior treatment) HPI Comments: Patient states that he woke up this morning with chest palpitations.  He's never had chest palpitations.  In the past before.  He denies chest pain, shortness of breath, DOE, PND, orthopnea, leg swelling, and claudication.  Patient states that he is due to have left knee surgery by Dr. Bertram Gala and he has been taking tramadol for pain.  In addition.  Patient has tried to take hydrocodone however, he did not like the way this makes him feel and it causes him to vomit.  In addition to this the patient is taking Motrin for her pain.  Patient has not taken his amlodipine Metroprolol pork when outgrow or clonidine this morning.  Of note patient recently had back surgery on October 2, but he did not need to stay in the hospital and he was ambulatory the next day.  Patient has no history of cardiac problems for clotting in the past.  The history is provided by the patient.    Past Medical History  Diagnosis Date  . Hypertension     History reviewed. No pertinent past surgical history.  No family history on file.  History  Substance Use Topics  . Smoking status: Not on file  . Smokeless tobacco: Not on file  . Alcohol Use:       Review of Systems  Constitutional: Negative for fever, chills, diaphoresis, activity change, fatigue and unexpected weight change.  HENT: Negative for congestion, neck pain and neck stiffness.   Eyes: Negative for visual disturbance.  Respiratory: Negative for apnea, cough, chest tightness, shortness of breath, wheezing and stridor.   Cardiovascular: Positive for palpitations. Negative for chest pain and leg swelling.    Gastrointestinal: Negative for nausea, vomiting, abdominal pain, diarrhea and blood in stool.  Genitourinary: Negative for dysuria, urgency, hematuria and flank pain.  Musculoskeletal: Negative for myalgias, back pain and gait problem.  Skin: Negative for pallor.  Neurological: Negative for dizziness, seizures, syncope, speech difficulty, light-headedness and headaches.  All other systems reviewed and are negative.    Allergies  Hydrocodone  Home Medications   Current Outpatient Rx  Name Route Sig Dispense Refill  . AMLODIPINE BESYLATE 5 MG PO TABS Oral Take 5 mg by mouth daily.      . ASPIRIN EC 81 MG PO TBEC Oral Take 81 mg by mouth daily.      Marland Kitchen CLONIDINE HCL 0.1 MG PO TABS Oral Take 0.1 mg by mouth 2 (two) times daily.      Marland Kitchen DOCUSATE SODIUM 100 MG PO CAPS Oral Take 100 mg by mouth daily as needed. For constipation     . FINASTERIDE 1 MG PO TABS Oral Take 1 mg by mouth daily.      . FUROSEMIDE 40 MG PO TABS Oral Take 40 mg by mouth daily.      . IBUPROFEN 200 MG PO TABS Oral Take 400 mg by mouth every 6 (six) hours as needed. For pain     . METOPROLOL SUCCINATE 100 MG PO TB24 Oral Take 100 mg by mouth daily.      Carma Leaven M PLUS PO TABS Oral Take 1 tablet by mouth daily.      Marland Kitchen  OMEGA-3-ACID ETHYL ESTERS 1 G PO CAPS Oral Take 1 g by mouth 2 (two) times daily.      Marland Kitchen PRESCRIPTION MEDICATION  Quinapril      . TRAMADOL HCL 50 MG PO TABS Oral Take 50 mg by mouth every 6 (six) hours as needed. For pain Maximum dose= 8 tablets per day       BP 144/84  Pulse 138  Temp(Src) 98.1 F (36.7 C) (Rectal)  Resp 18  SpO2 97%  Physical Exam  Nursing note and vitals reviewed. Constitutional: He is oriented to person, place, and time. He appears well-developed and well-nourished. No distress.  HENT:  Head: Normocephalic and atraumatic.  Eyes:       Normal appearance  Neck: Normal range of motion.  Cardiovascular: Regular rhythm, S1 normal, S2 normal and normal heart sounds.   No  extrasystoles are present. Tachycardia present.   Pulmonary/Chest: Effort normal and breath sounds normal. No respiratory distress. He exhibits no tenderness.  Abdominal: Soft. Bowel sounds are normal. He exhibits no distension. There is no tenderness. There is no guarding.  Musculoskeletal: He exhibits no edema and no tenderness.       Left knee: tenderness (patient is getting total knee replacement.) found.       2+ Dorsalis Pedis pulses  Neurological: He is alert and oriented to person, place, and time. He has normal strength and normal reflexes. No cranial nerve deficit. He exhibits normal muscle tone. He displays a negative Romberg sign. He displays no seizure activity.  Skin: Skin is warm and dry. No rash noted. He is not diaphoretic.  Psychiatric: He has a normal mood and affect. His behavior is normal.    ED Course  Procedures (including critical care time)  Labs Reviewed  CBC - Abnormal; Notable for the following:    RBC 4.06 (*)    All other components within normal limits  COMPREHENSIVE METABOLIC PANEL - Abnormal; Notable for the following:    Glucose, Bld 136 (*)    GFR calc non Af Amer 85 (*)    All other components within normal limits  URINALYSIS, ROUTINE W REFLEX MICROSCOPIC  POCT I-STAT TROPONIN I  I-STAT TROPONIN I  URINE CULTURE   Ct Angio Chest W/cm &/or Wo Cm  04/07/2011  *RADIOLOGY REPORT*  Clinical Data:  Shortness of breath, chest pain, tachycardia, evaluate for PE  CHEST CT WITHOUT CONTRAST  Technique:  Multidetector CT imaging of the chest was performed following the standard protocol without intravenous contrast.  Comparison:  Chest radiograph dated 04/07/2011  Findings:  No evidence of pulmonary embolism.  Moderate centrilobular emphysematous changes.  Elevation of the right hemidiaphragm with mild right lower lobe atelectasis.  No suspicious pulmonary nodules.  No pleural effusion or pneumothorax.  Visualized thyroid is mildly heterogeneous.  The heart is  normal in size.  No pericardial effusion.  Coronary atherosclerosis.  Atherosclerotic calcifications of the aortic arch.  Visualized upper abdomen is notable for calcified splenic granulomata.  Mild degenerative changes of the visualized thoracolumbar spine. Benign appearing lytic lesion in the T11 vertebral body, unchanged from prior CT abdomen pelvis.  IMPRESSION: No evidence of pulmonary embolism.  Moderate centrilobular emphysematous changes.  Original Report Authenticated By: Charline Bills, M.D.   Dg Chest Port 1 View  04/07/2011  *RADIOLOGY REPORT*  Clinical Data: Tachycardia and hypertension.  PORTABLE CHEST - 1 VIEW  Comparison: 02/11/2011  Findings: Chronic elevation of the right hemidiaphragm is stable with associated volume loss.  No edema or focal  parenchymal consolidation.  No pleural effusion.  The heart size is within normal limits.  IMPRESSION: No active disease.  Stable chronic elevation of the right hemidiaphragm.  Original Report Authenticated By: Reola Calkins, M.D.     No diagnosis found.  EKG: sinus tachycardia, borderline Q waves in inferior leads   Patient's home dose of Toprol was given.  His heart rate is currently 120.  We'll reevaluate him in half an hour.  Patient is currently hemodynamically stable and states, that he's not having any chest pain or difficulty breathing.  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 Spoke with Dr. Gasper Sells who will see and admit pt.  Inpatient; Telemetry, [facility] Team #1-Dr. Gasper Sells, Dx: sinus tachy MDM  Tachycardia        Jaci Carrel, [place] 04/07/11 1317

## 2011-04-07 NOTE — ED Notes (Signed)
Pt back from being out of the department; pt placed back on monitor, continuous pulse oximetry and blood pressure cuff; vitals taken

## 2011-04-07 NOTE — Discharge Summary (Addendum)
This is Dr. Gasper Sells dictating a discharge note on [name].  [name] was seen earlier and the plan was to admit him to the hospital. Please see the H&P dictated earlier today. While the patient was still in the emergency department he was seen by Dr. Henreitta Cea cardiology care she reviewed his EKG and after discussing the case with me in the patient it was decided he could go home. The patient's repeat EKGs revealed normal sinus rhythm after one extra dose of by mouth Lopressor his heart rate came down to the 90s never had any chest pain. He is feeling well at this time. His workup was all negative. Her workup included a CT angiogram which ruled out PE.  He had a negative troponin his EKG revealed sinus rhythm easily sinus tachycardia later normal sinus rhythm. Does not have a fever. His ABG reveals no evidence of hypoxia. It was therefore felt reasonable to discharge the patient to home to followup with cardiology as an outpatient here patient's by mouth beta blocker will be increased and he will followup with cardiology next week was personally instructed by the cardiologist to call their office should he have any increase in symptoms. Patient is in agreement with this plan  He is to followup with Dr. Carolanne Grumbling at [facility] cardiology on Monday, November 26 at 8:30 in the morning.  Discharge diagnoses include  Discharge Diagnoses: Sinus tachycardia cause unknown but major illnesses ruled out. Hypertension, uncontrolled  New medications on discharge Toprol XL was increased from 100mg  to 150mg .          [name], [name]  Home Medication Instructions [id number]   Printed on:04/07/11 1742  Medication Information                    amLODipine (NORVASC) 5 MG tablet Take 5 mg by mouth daily.             aspirin EC 81 MG tablet Take 81 mg by mouth daily.             cloNIDine (CATAPRES) 0.1 MG tablet Take 0.1 mg by mouth 2 (two) times daily.             docusate  sodium (COLACE) 100 MG capsule Take 100 mg by mouth daily as needed. For constipation            finasteride (PROPECIA) 1 MG tablet Take 1 mg by mouth daily.             furosemide (LASIX) 40 MG tablet Take 40 mg by mouth daily.             metoprolol (TOPROL-XL) 100 MG 24 hr tablet Take 100 mg by mouth daily.             Multiple Vitamins-Minerals (MULTIVITAMINS THER. W/MINERALS) TABS Take 1 tablet by mouth daily.             omega-3 acid ethyl esters (LOVAZA) 1 G capsule Take 1 g by mouth 2 (two) times daily.             PRESCRIPTION MEDICATION Quinapril             traMADol (ULTRAM) 50 MG tablet Take 50 mg by mouth every 6 (six) hours as needed. For pain Maximum dose= 8 tablets per day            metoprolol (TOPROL XL) 50 MG 24 hr tablet Take 1 tablet (50 mg total) by mouth  daily. Take with the 100mg  pill to make 150mg .               Greater than 30 minutes spent on discharging this patient.   Syreeta Figler

## 2011-04-07 NOTE — ED Provider Notes (Signed)
Medical screening examination/treatment/procedure(s) were performed by non-physician practitioner and as supervising physician I was immediately available for consultation/collaboration.   Laray Anger, DO 04/07/11 [id number]

## 2011-04-07 NOTE — ED Notes (Signed)
Ordered food tray for patient.  

## 2011-04-07 NOTE — ED Notes (Signed)
Pt states woke up at 0400 to use restroom. Pt states felt heart pounding. Pt states went back to bed. Pt states woke up around 0615 and felt same way. Pt states feels jittery. Pt obviously shaking. Pt denies chest pain or sob.

## 2011-04-14 ENCOUNTER — Other Ambulatory Visit: Payer: Self-pay | Admitting: Dermatology

## 2011-05-15 ENCOUNTER — Encounter (HOSPITAL_COMMUNITY): Payer: Self-pay | Admitting: Pharmacy Technician

## 2011-05-26 ENCOUNTER — Other Ambulatory Visit: Payer: Self-pay | Admitting: Physician Assistant

## 2011-05-26 ENCOUNTER — Encounter: Payer: Self-pay | Admitting: Physician Assistant

## 2011-05-26 NOTE — H&P (Signed)
[name] is Roy 78 y.o. male.   Chief Complaint: left knee end stage djd HPI: 78 yowm with endstage DJD left knee that has failed multiple cortisone injections, supartz injections, antiinflammatories, and narcotic pain meds.    Past Medical History  Diagnosis Date  . Hypertension   . Heart palpitations 2012  . Arthritis, lumbar spine   . Anxiety   . Arthritis   . BPH (benign prostatic hyperplasia)   . Edema of both legs   . Dysrhythmia     1st degree heart block  palpitations 04/07/2011    Past Surgical History  Procedure Date  . Total knee arthroplasty 2007    right total knee  . Total hip arthroplasty 2007    right total hip replacement  . Total shoulder arthroplasty 2007  . Back surgery 01/2011    Family History  Problem Relation Age of Onset  . Heart attack Mother   . Hypertension Mother   . Suicidality Father   . Heart disease Sister    Social History:  reports that he quit smoking about 27 years ago. He does not have any smokeless tobacco history on file. He reports that he drinks about .6 ounces of alcohol per week. He reports that he does not use illicit drugs.  Allergies:  Allergies  Allergen Reactions  . Hydrocodone Nausea And Vomiting    Medications Prior to Admission  Medication Sig Dispense Refill  . amLODipine (NORVASC) 5 MG tablet Take 5 mg by mouth daily.       . aspirin EC 81 MG tablet Take 81 mg by mouth daily.        . cloNIDine (CATAPRES) 0.1 MG tablet Take 0.1 mg by mouth 2 (two) times daily.        . docusate sodium (COLACE) 100 MG capsule Take 100 mg by mouth daily.       . finasteride (PROSCAR) 5 MG tablet Take 5 mg by mouth every morning.       . furosemide (LASIX) 40 MG tablet Take 40 mg by mouth daily.        . metoprolol (TOPROL-XL) 100 MG 24 hr tablet Take 100 mg by mouth every morning.       . Multiple Vitamins-Minerals (MULTIVITAMINS THER. W/MINERALS) TABS Take 1 tablet by mouth daily.        . omega-3 acid ethyl esters  (LOVAZA) 1 G capsule Take 1 g by mouth daily.       . quinapril (ACCUPRIL) 40 MG tablet Take 40 mg by mouth daily.        . spironolactone (ALDACTONE) 25 MG tablet Take 25 mg by mouth daily.        . traMADol (ULTRAM) 50 MG tablet Take 100 mg by mouth every 8 (eight) hours as needed. For pain Maximum dose= 8 tablets per day       No current facility-administered medications on file as of 05/26/2011.    No results found for this or any previous visit (from the past 48 hour(s)). No results found.  Review of Systems  Constitutional: Negative.   HENT: Negative.   Eyes: Negative.   Respiratory: Negative.   Cardiovascular: Positive for palpitations and leg swelling. Negative for chest pain, orthopnea, claudication and PND.  Gastrointestinal: Negative.   Genitourinary: Negative.   Musculoskeletal: Positive for joint pain.       Left knee joint  Skin: Negative.   Neurological: Negative.   Endo/Heme/Allergies: Bruises/bleeds easily.  Psychiatric/Behavioral: Negative.       Blood pressure 165/81, pulse 75, temperature 97.9 F (36.6 C), height 5' 10" (1.778 m), weight 120.203 kg (265 lb), SpO2 95.00%. Physical Exam  Constitutional: He is oriented to person, place, and time. He appears well-developed and well-nourished.  HENT:  Head: Normocephalic and atraumatic.  Mouth/Throat: Oropharynx is clear and moist.  Eyes: Conjunctivae and EOM are normal. Pupils are equal, round, and reactive to light.  Neck: Neck supple.  Cardiovascular: Normal rate, regular rhythm, normal heart sounds and intact distal pulses.   Respiratory: Effort normal and breath sounds normal.  GI: Soft. Bowel sounds are normal.  Genitourinary:       Not pertinent to current symptomatology therefore not examined.  Musculoskeletal: He exhibits edema and tenderness.       Examination of his left knee reveals 1 to 2+ crepitation 1+ synovitis, range of motion 0-120 degrees, knee is stable with diffuse pain and mild varus  deformity. Exam of the right knee reveals well healed total knee incision without swelling or pain. Vascular exam: pulses 2+ and symmetric.  Neurological: He is alert and oriented to person, place, and time. He has normal reflexes.  Skin: Skin is warm and dry.  Psychiatric: He has a normal mood and affect. His behavior is normal. Judgment and thought content normal.     Assessment Patient Active Problem List  Diagnoses  . Tachycardia  . Hypertension  . BPH (benign prostatic hyperplasia)  . DJD (degenerative joint disease)    Plan I talked to him about this in detail. I do think at this point that he's ready to consider left total knee replacement. Discussed risks benefits and possible complications of the surgery in detail and he understands this completely. He is cleared preoperatively by Dr. Stoneking and Dr Traci Turner. The patient's clinical condition is marked by substantial pain and/or significant functional disability. Other conservative therapy has not provided relief, is contraindicated, or not appropriate. The risks, benefits, and possible complications of the procedure were discussed in detail with the patient.  The patient is without question.   Ghassan Coggeshall J 05/26/2011, 4:14 PM    

## 2011-05-27 ENCOUNTER — Encounter (HOSPITAL_COMMUNITY): Payer: Self-pay

## 2011-05-27 ENCOUNTER — Encounter (HOSPITAL_COMMUNITY)
Admission: RE | Admit: 2011-05-27 | Discharge: 2011-05-27 | Disposition: A | Discharge status: Home / Self Care | Payer: Medicare Other | Source: Ambulatory Visit | Attending: Orthopedic Surgery | Admitting: Orthopedic Surgery

## 2011-05-27 LAB — CBC
HCT: 42.5 % (ref 39.0–52.0)
Hemoglobin: 14.7 g/dL (ref 13.0–17.0)
MCH: 32.2 pg (ref 26.0–34.0)
MCV: 93.2 fL (ref 78.0–100.0)
Platelets: 262 10*3/uL (ref 150–400)
RBC: 4.56 MIL/uL (ref 4.22–5.81)
WBC: 9.2 10*3/uL (ref 4.0–10.5)

## 2011-05-27 LAB — DIFFERENTIAL
Basophils Absolute: 0 10*3/uL (ref 0.0–0.1)
Basophils Relative: 0 % (ref 0–1)
Monocytes Absolute: 1.1 10*3/uL — ABNORMAL HIGH (ref 0.1–1.0)
Neutro Abs: 4.7 10*3/uL (ref 1.7–7.7)

## 2011-05-27 LAB — COMPREHENSIVE METABOLIC PANEL
AST: 23 U/L (ref 0–37)
BUN: 17 mg/dL (ref 6–23)
CO2: 28 mEq/L (ref 19–32)
Calcium: 9.7 mg/dL (ref 8.4–10.5)
Chloride: 95 mEq/L — ABNORMAL LOW (ref 96–112)
Creatinine, Ser: 0.96 mg/dL (ref 0.50–1.35)
GFR calc Af Amer: >90 mL/min (ref >90)
GFR calc non Af Amer: 79 mL/min — ABNORMAL LOW (ref >90)
Glucose, Bld: 102 mg/dL — ABNORMAL HIGH (ref 70–99)
Total Bilirubin: 0.5 mg/dL (ref 0.3–1.2)

## 2011-05-27 LAB — URINALYSIS, ROUTINE W REFLEX MICROSCOPIC
Bilirubin Urine: NEGATIVE
Glucose, UA: NEGATIVE mg/dL
Hgb urine dipstick: NEGATIVE
Protein, ur: NEGATIVE mg/dL
Specific Gravity, Urine: 1.007 (ref 1.005–1.030)

## 2011-05-27 LAB — URINE CULTURE: Colony Count: >=100000

## 2011-05-27 LAB — PROTIME-INR: Prothrombin Time: 14.3 seconds (ref 11.6–15.2)

## 2011-05-27 LAB — TYPE AND SCREEN: ABO/RH(D): A POS

## 2011-05-27 MED ORDER — CHLORHEXIDINE GLUCONATE 4 % EX LIQD: 60.0000 mL | Freq: Once | CUTANEOUS | Status: DC

## 2011-05-27 NOTE — Pre-Procedure Instructions (Signed)
22 [name]  05/27/2011   Your procedure is scheduled on:  06/01/2011  Report to [facility] at 8:00 AM.  Call this number if you have problems the morning of surgery: [phone]   Remember:   Do not eat food:After Midnight.  May have clear liquids: up to 4 Hours before arrival.  Clear liquids include soda, tea, black coffee, apple or grape juice, broth.  Take these medicines the morning of surgery with A SIP OF WATER: clonidine, metoprolol, amlodipine, can take pain med. If needed    Do not wear jewelry, make-up or nail polish.  Do not wear lotions, powders, or perfumes. You may wear deodorant.  Do not shave 48 hours prior to surgery.  Do not bring valuables to the hospital.  Contacts, dentures or bridgework may not be worn into surgery.  Leave suitcase in the car. After surgery it may be brought to your room.  For patients admitted to the hospital, checkout time is 11:00 AM the day of discharge.   Patients discharged the day of surgery will not be allowed to drive home.  Name and phone number of your driver:   Special Instructions: CHG Shower Use Special Wash: 1/2 bottle night before surgery and 1/2 bottle morning of surgery.   Please read over the following fact sheets that you were given: Pain Booklet, Coughing and Deep Breathing, Blood Transfusion Information, Total Joint Packet, MRSA Information and Surgical Site Infection Prevention

## 2011-05-31 MED ORDER — POVIDONE-IODINE 7.5 % EX SOLN: Freq: Once | CUTANEOUS | Status: DC

## 2011-05-31 MED ORDER — LACTATED RINGERS IV SOLN: INTRAVENOUS | Status: DC

## 2011-05-31 MED ORDER — CEFAZOLIN SODIUM-DEXTROSE 2-3 GM-% IV SOLR
2.0000 g | INTRAVENOUS | Status: AC
  Administered 2011-06-01: 2 g via INTRAVENOUS
  Filled 2011-05-31: qty 50

## 2011-06-01 ENCOUNTER — Encounter (HOSPITAL_COMMUNITY): Payer: Self-pay | Admitting: *Deleted

## 2011-06-01 ENCOUNTER — Encounter (HOSPITAL_COMMUNITY): Payer: Self-pay | Admitting: Anesthesiology

## 2011-06-01 ENCOUNTER — Inpatient Hospital Stay (HOSPITAL_COMMUNITY)
Admission: RE | Admit: 2011-06-01 | Discharge: 2011-06-04 | DRG: 470 | Disposition: A | Discharge status: Skilled Nursing Facility | Payer: Medicare Other | Source: Ambulatory Visit | Attending: Orthopedic Surgery | Admitting: Orthopedic Surgery

## 2011-06-01 ENCOUNTER — Ambulatory Visit (HOSPITAL_COMMUNITY): Payer: Medicare Other | Admitting: Anesthesiology

## 2011-06-01 ENCOUNTER — Encounter (HOSPITAL_COMMUNITY)
Admission: RE | Disposition: A | Discharge status: Skilled Nursing Facility | Payer: Self-pay | Source: Ambulatory Visit | Attending: Orthopedic Surgery

## 2011-06-01 DIAGNOSIS — D62 Acute posthemorrhagic anemia: Secondary | ICD-10-CM | POA: Diagnosis not present

## 2011-06-01 DIAGNOSIS — E871 Hypo-osmolality and hyponatremia: Secondary | ICD-10-CM | POA: Diagnosis present

## 2011-06-01 DIAGNOSIS — J45909 Unspecified asthma, uncomplicated: Secondary | ICD-10-CM | POA: Diagnosis not present

## 2011-06-01 DIAGNOSIS — Z96659 Presence of unspecified artificial knee joint: Secondary | ICD-10-CM

## 2011-06-01 DIAGNOSIS — Z01812 Encounter for preprocedural laboratory examination: Secondary | ICD-10-CM

## 2011-06-01 DIAGNOSIS — R000 Tachycardia, unspecified: Secondary | ICD-10-CM | POA: Diagnosis present

## 2011-06-01 DIAGNOSIS — J45901 Unspecified asthma with (acute) exacerbation: Secondary | ICD-10-CM | POA: Diagnosis not present

## 2011-06-01 DIAGNOSIS — N400 Enlarged prostate without lower urinary tract symptoms: Secondary | ICD-10-CM | POA: Diagnosis present

## 2011-06-01 DIAGNOSIS — Z7982 Long term (current) use of aspirin: Secondary | ICD-10-CM

## 2011-06-01 DIAGNOSIS — M1990 Unspecified osteoarthritis, unspecified site: Secondary | ICD-10-CM | POA: Diagnosis present

## 2011-06-01 DIAGNOSIS — I10 Essential (primary) hypertension: Secondary | ICD-10-CM | POA: Diagnosis present

## 2011-06-01 DIAGNOSIS — M1710 Unilateral primary osteoarthritis, unspecified knee: Principal | ICD-10-CM | POA: Diagnosis present

## 2011-06-01 DIAGNOSIS — M47817 Spondylosis without myelopathy or radiculopathy, lumbosacral region: Secondary | ICD-10-CM | POA: Diagnosis present

## 2011-06-01 DIAGNOSIS — Z96619 Presence of unspecified artificial shoulder joint: Secondary | ICD-10-CM

## 2011-06-01 DIAGNOSIS — Z79899 Other long term (current) drug therapy: Secondary | ICD-10-CM

## 2011-06-01 DIAGNOSIS — IMO0001 Reserved for inherently not codable concepts without codable children: Secondary | ICD-10-CM | POA: Diagnosis not present

## 2011-06-01 DIAGNOSIS — R609 Edema, unspecified: Secondary | ICD-10-CM | POA: Diagnosis present

## 2011-06-01 DIAGNOSIS — Z96649 Presence of unspecified artificial hip joint: Secondary | ICD-10-CM

## 2011-06-01 DIAGNOSIS — F411 Generalized anxiety disorder: Secondary | ICD-10-CM | POA: Diagnosis present

## 2011-06-01 HISTORY — PX: TOTAL KNEE ARTHROPLASTY: SHX125

## 2011-06-01 SURGERY — ARTHROPLASTY, KNEE, TOTAL
Anesthesia: General | Site: Knee | Laterality: Left | Wound class: Clean

## 2011-06-01 MED ORDER — BUPIVACAINE-EPINEPHRINE PF 0.5-1:200000 % IJ SOLN
INTRAMUSCULAR | Status: DC | PRN
  Administered 2011-06-01: 30 mL

## 2011-06-01 MED ORDER — CEFAZOLIN SODIUM-DEXTROSE 2-3 GM-% IV SOLR
2.0000 g | Freq: Four times a day (QID) | INTRAVENOUS | Status: AC
  Administered 2011-06-01 – 2011-06-02 (×3): 2 g via INTRAVENOUS
  Filled 2011-06-01 (×3): qty 50

## 2011-06-01 MED ORDER — ACETAMINOPHEN 325 MG PO TABS
650.0000 mg | ORAL_TABLET | Freq: Four times a day (QID) | ORAL | Status: DC | PRN
  Administered 2011-06-03 (×2): 650 mg via ORAL
  Filled 2011-06-01 (×2): qty 2

## 2011-06-01 MED ORDER — POTASSIUM CHLORIDE IN NACL 20-0.9 MEQ/L-% IV SOLN
INTRAVENOUS | Status: DC
  Administered 2011-06-01: 17:00:00 via INTRAVENOUS
  Filled 2011-06-01 (×10): qty 1000

## 2011-06-01 MED ORDER — FENTANYL CITRATE 0.05 MG/ML IJ SOLN
INTRAMUSCULAR | Status: AC
  Filled 2011-06-01: qty 2

## 2011-06-01 MED ORDER — DOCUSATE SODIUM 100 MG PO CAPS
100.0000 mg | ORAL_CAPSULE | Freq: Every day | ORAL | Status: DC
  Administered 2011-06-01 – 2011-06-04 (×4): 100 mg via ORAL
  Filled 2011-06-01 (×4): qty 1

## 2011-06-01 MED ORDER — PHENYLEPHRINE HCL 10 MG/ML IJ SOLN
10.0000 mg | INTRAVENOUS | Status: DC | PRN
  Administered 2011-06-01: 20 ug/min via INTRAVENOUS

## 2011-06-01 MED ORDER — OXYCODONE-ACETAMINOPHEN 5-325 MG PO TABS
1.0000 | ORAL_TABLET | ORAL | Status: DC | PRN
  Administered 2011-06-01 – 2011-06-02 (×7): 2 via ORAL
  Filled 2011-06-01 (×6): qty 2

## 2011-06-01 MED ORDER — LISINOPRIL 40 MG PO TABS
40.0000 mg | ORAL_TABLET | Freq: Every day | ORAL | Status: DC
  Administered 2011-06-01 – 2011-06-04 (×3): 40 mg via ORAL
  Filled 2011-06-01 (×4): qty 1

## 2011-06-01 MED ORDER — QUINAPRIL HCL 10 MG PO TABS
40.0000 mg | ORAL_TABLET | Freq: Every day | ORAL | Status: DC
Start: 2011-06-01 — End: 2011-06-01

## 2011-06-01 MED ORDER — HYDROMORPHONE HCL PF 1 MG/ML IJ SOLN
INTRAMUSCULAR | Status: AC
  Filled 2011-06-01: qty 1

## 2011-06-01 MED ORDER — LACTATED RINGERS IV SOLN
INTRAVENOUS | Status: DC
  Administered 2011-06-01: 10:00:00 via INTRAVENOUS

## 2011-06-01 MED ORDER — MIDAZOLAM HCL 2 MG/2ML IJ SOLN
INTRAMUSCULAR | Status: AC
  Filled 2011-06-01: qty 2

## 2011-06-01 MED ORDER — FINASTERIDE 5 MG PO TABS
5.0000 mg | ORAL_TABLET | ORAL | Status: DC
  Administered 2011-06-02 – 2011-06-04 (×3): 5 mg via ORAL
  Filled 2011-06-01 (×4): qty 1

## 2011-06-01 MED ORDER — ONDANSETRON HCL 4 MG/2ML IJ SOLN
4.0000 mg | Freq: Four times a day (QID) | INTRAMUSCULAR | Status: DC | PRN
  Administered 2011-06-01: 4 mg via INTRAVENOUS
  Filled 2011-06-01: qty 2

## 2011-06-01 MED ORDER — ONDANSETRON HCL 4 MG PO TABS: 4.0000 mg | ORAL_TABLET | Freq: Four times a day (QID) | ORAL | Status: DC | PRN

## 2011-06-01 MED ORDER — MIDAZOLAM HCL 5 MG/5ML IJ SOLN
INTRAMUSCULAR | Status: DC | PRN
  Administered 2011-06-01: 1 mg via INTRAVENOUS

## 2011-06-01 MED ORDER — CLONIDINE HCL 0.1 MG PO TABS
0.1000 mg | ORAL_TABLET | Freq: Two times a day (BID) | ORAL | Status: DC
  Administered 2011-06-01 – 2011-06-04 (×5): 0.1 mg via ORAL
  Filled 2011-06-01 (×8): qty 1

## 2011-06-01 MED ORDER — OXYCODONE-ACETAMINOPHEN 5-325 MG PO TABS
ORAL_TABLET | ORAL | Status: AC
  Filled 2011-06-01: qty 2

## 2011-06-01 MED ORDER — HYDROMORPHONE HCL PF 1 MG/ML IJ SOLN
0.2500 mg | INTRAMUSCULAR | Status: DC | PRN
  Administered 2011-06-01 (×4): 0.5 mg via INTRAVENOUS

## 2011-06-01 MED ORDER — METOCLOPRAMIDE HCL 5 MG/ML IJ SOLN
5.0000 mg | Freq: Three times a day (TID) | INTRAMUSCULAR | Status: DC | PRN
  Filled 2011-06-01: qty 2

## 2011-06-01 MED ORDER — MEPERIDINE HCL 25 MG/ML IJ SOLN: 6.2500 mg | INTRAMUSCULAR | Status: DC | PRN

## 2011-06-01 MED ORDER — SUFENTANIL CITRATE 50 MCG/ML IV SOLN
INTRAVENOUS | Status: DC | PRN
  Administered 2011-06-01: 5 ug via INTRAVENOUS
  Administered 2011-06-01: 20 ug via INTRAVENOUS

## 2011-06-01 MED ORDER — SORBITOL 70 % SOLN
30.0000 mL | Freq: Two times a day (BID) | Status: DC
  Administered 2011-06-01 – 2011-06-04 (×6): 30 mL via ORAL
  Filled 2011-06-01 (×8): qty 30

## 2011-06-01 MED ORDER — PHENOL 1.4 % MT LIQD
1.0000 | OROMUCOSAL | Status: DC | PRN
  Filled 2011-06-01: qty 177

## 2011-06-01 MED ORDER — OXYCODONE HCL 5 MG PO TABS
5.0000 mg | ORAL_TABLET | ORAL | Status: DC | PRN
  Administered 2011-06-02 (×2): 10 mg via ORAL
  Filled 2011-06-01 (×2): qty 2

## 2011-06-01 MED ORDER — METOPROLOL SUCCINATE ER 100 MG PO TB24
100.0000 mg | ORAL_TABLET | ORAL | Status: DC
  Administered 2011-06-02 – 2011-06-04 (×3): 100 mg via ORAL
  Filled 2011-06-01 (×4): qty 1

## 2011-06-01 MED ORDER — SODIUM CHLORIDE 0.9 % IR SOLN
Status: DC | PRN
  Administered 2011-06-01: 3000 mL

## 2011-06-01 MED ORDER — HYDROMORPHONE HCL PF 1 MG/ML IJ SOLN
0.5000 mg | INTRAMUSCULAR | Status: DC | PRN
  Administered 2011-06-01: 1 mg via INTRAVENOUS
  Administered 2011-06-01: 15:00:00 via INTRAVENOUS
  Administered 2011-06-01: 1 mg via INTRAVENOUS
  Filled 2011-06-01 (×2): qty 1

## 2011-06-01 MED ORDER — MENTHOL 3 MG MT LOZG
1.0000 | LOZENGE | OROMUCOSAL | Status: DC | PRN
  Filled 2011-06-01: qty 9

## 2011-06-01 MED ORDER — PROPOFOL 10 MG/ML IV EMUL
INTRAVENOUS | Status: DC | PRN
  Administered 2011-06-01: 160 mg via INTRAVENOUS

## 2011-06-01 MED ORDER — CEFUROXIME SODIUM 1.5 G IJ SOLR
INTRAMUSCULAR | Status: DC | PRN
  Administered 2011-06-01: 1.5 g

## 2011-06-01 MED ORDER — THERA M PLUS PO TABS
1.0000 | ORAL_TABLET | Freq: Every day | ORAL | Status: DC
  Administered 2011-06-01 – 2011-06-04 (×4): 1 via ORAL
  Filled 2011-06-01 (×4): qty 1

## 2011-06-01 MED ORDER — FENTANYL CITRATE 0.05 MG/ML IJ SOLN
50.0000 ug | INTRAMUSCULAR | Status: DC | PRN
  Administered 2011-06-01: 100 ug via INTRAVENOUS

## 2011-06-01 MED ORDER — MIDAZOLAM HCL 2 MG/2ML IJ SOLN
1.0000 mg | INTRAMUSCULAR | Status: DC | PRN
  Administered 2011-06-01: 2 mg via INTRAVENOUS

## 2011-06-01 MED ORDER — LACTATED RINGERS IV SOLN
INTRAVENOUS | Status: DC | PRN
  Administered 2011-06-01 (×2): via INTRAVENOUS

## 2011-06-01 MED ORDER — ONDANSETRON HCL 4 MG/2ML IJ SOLN: 4.0000 mg | Freq: Once | INTRAMUSCULAR | Status: DC | PRN

## 2011-06-01 MED ORDER — METOCLOPRAMIDE HCL 10 MG PO TABS: 5.0000 mg | ORAL_TABLET | Freq: Three times a day (TID) | ORAL | Status: DC | PRN

## 2011-06-01 MED ORDER — ACETAMINOPHEN 650 MG RE SUPP: 650.0000 mg | Freq: Four times a day (QID) | RECTAL | Status: DC | PRN

## 2011-06-01 MED ORDER — AMLODIPINE BESYLATE 5 MG PO TABS
5.0000 mg | ORAL_TABLET | Freq: Every day | ORAL | Status: DC
  Administered 2011-06-03 – 2011-06-04 (×2): 5 mg via ORAL
  Filled 2011-06-01 (×3): qty 1

## 2011-06-01 MED ORDER — ENOXAPARIN SODIUM 30 MG/0.3ML ~~LOC~~ SOLN
30.0000 mg | Freq: Two times a day (BID) | SUBCUTANEOUS | Status: DC
  Administered 2011-06-02 – 2011-06-04 (×5): 30 mg via SUBCUTANEOUS
  Filled 2011-06-01 (×7): qty 0.3

## 2011-06-01 SURGICAL SUPPLY — 67 items
AUTOTRANSFUSION W/QD PVC DRAIN (AUTOTRANSFUSION) IMPLANT
BANDAGE ESMARK 6X9 LF (GAUZE/BANDAGES/DRESSINGS) ×1 IMPLANT
BLADE SAGITTAL 25.0X1.19X90 (BLADE) ×2 IMPLANT
BLADE SAW SGTL 11.0X1.19X90.0M (BLADE) IMPLANT
BLADE SAW SGTL 13.0X1.19X90.0M (BLADE) ×2 IMPLANT
BLADE SURG 10 STRL SS (BLADE) ×4 IMPLANT
BNDG ELASTIC 6X15 VLCR STRL LF (GAUZE/BANDAGES/DRESSINGS) ×2 IMPLANT
BNDG ESMARK 6X9 LF (GAUZE/BANDAGES/DRESSINGS) ×2
BOWL SMART MIX CTS (DISPOSABLE) ×2 IMPLANT
CEMENT HV SMART SET (Cement) ×4 IMPLANT
CLOTH BEACON ORANGE TIMEOUT ST (SAFETY) ×2 IMPLANT
COVER BACK TABLE 24X17X13 BIG (DRAPES) IMPLANT
COVER PROBE W GEL 5X96 (DRAPES) ×2 IMPLANT
COVER SURGICAL LIGHT HANDLE (MISCELLANEOUS) ×2 IMPLANT
CUFF TOURNIQUET SINGLE 34IN LL (TOURNIQUET CUFF) ×2 IMPLANT
CUFF TOURNIQUET SINGLE 44IN (TOURNIQUET CUFF) IMPLANT
DRAPE EXTREMITY T 121X128X90 (DRAPE) ×2 IMPLANT
DRAPE INCISE IOBAN 66X45 STRL (DRAPES) ×2 IMPLANT
DRAPE PROXIMA HALF (DRAPES) ×2 IMPLANT
DRAPE U-SHAPE 47X51 STRL (DRAPES) ×2 IMPLANT
DRSG ADAPTIC 3X8 NADH LF (GAUZE/BANDAGES/DRESSINGS) ×2 IMPLANT
DRSG PAD ABDOMINAL 8X10 ST (GAUZE/BANDAGES/DRESSINGS) ×4 IMPLANT
DURAPREP 26ML APPLICATOR (WOUND CARE) ×2 IMPLANT
ELECT CAUTERY BLADE 6.4 (BLADE) ×2 IMPLANT
ELECT REM PT RETURN 9FT ADLT (ELECTROSURGICAL) ×2
ELECTRODE REM PT RTRN 9FT ADLT (ELECTROSURGICAL) ×1 IMPLANT
EVACUATOR 1/8 PVC DRAIN (DRAIN) ×2 IMPLANT
FACESHIELD LNG OPTICON STERILE (SAFETY) ×2 IMPLANT
GLOVE BIO SURGEON STRL SZ7 (GLOVE) ×2 IMPLANT
GLOVE BIOGEL PI IND STRL 7.0 (GLOVE) ×1 IMPLANT
GLOVE BIOGEL PI IND STRL 7.5 (GLOVE) ×1 IMPLANT
GLOVE BIOGEL PI INDICATOR 7.0 (GLOVE) ×1
GLOVE BIOGEL PI INDICATOR 7.5 (GLOVE) ×1
GLOVE SS BIOGEL STRL SZ 7.5 (GLOVE) ×1 IMPLANT
GLOVE SUPERSENSE BIOGEL SZ 7.5 (GLOVE) ×1
GOWN PREVENTION PLUS XLARGE (GOWN DISPOSABLE) ×4 IMPLANT
GOWN STRL NON-REIN LRG LVL3 (GOWN DISPOSABLE) IMPLANT
HANDPIECE INTERPULSE COAX TIP (DISPOSABLE) ×1
HOOD PEEL AWAY FACE SHEILD DIS (HOOD) ×4 IMPLANT
IMMOBILIZER KNEE 22 UNIV (SOFTGOODS) IMPLANT
INSERT CUSHION PRONEVIEW LG (MISCELLANEOUS) ×2 IMPLANT
KIT BASIN OR (CUSTOM PROCEDURE TRAY) ×2 IMPLANT
KIT ROOM TURNOVER OR (KITS) ×2 IMPLANT
MANIFOLD NEPTUNE II (INSTRUMENTS) ×2 IMPLANT
NEEDLE 18GX1X1/2 (RX/OR ONLY) (NEEDLE) ×2 IMPLANT
NS IRRIG 1000ML POUR BTL (IV SOLUTION) ×2 IMPLANT
PACK TOTAL JOINT (CUSTOM PROCEDURE TRAY) ×2 IMPLANT
PAD ARMBOARD 7.5X6 YLW CONV (MISCELLANEOUS) ×4 IMPLANT
PAD CAST 4YDX4 CTTN HI CHSV (CAST SUPPLIES) ×1 IMPLANT
PADDING CAST COTTON 4X4 STRL (CAST SUPPLIES) ×1
PADDING CAST COTTON 6X4 STRL (CAST SUPPLIES) ×2 IMPLANT
POSITIONER HEAD PRONE TRACH (MISCELLANEOUS) ×2 IMPLANT
SET HNDPC FAN SPRY TIP SCT (DISPOSABLE) ×1 IMPLANT
SPONGE GAUZE 4X4 12PLY (GAUZE/BANDAGES/DRESSINGS) ×2 IMPLANT
STRIP CLOSURE SKIN 1/2X4 (GAUZE/BANDAGES/DRESSINGS) ×2 IMPLANT
SUCTION FRAZIER TIP 10 FR DISP (SUCTIONS) ×2 IMPLANT
SUT ETHIBOND NAB CT1 #1 30IN (SUTURE) ×4 IMPLANT
SUT MNCRL AB 3-0 PS2 18 (SUTURE) ×2 IMPLANT
SUT VIC AB 0 CT1 27 (SUTURE) ×2
SUT VIC AB 0 CT1 27XBRD ANBCTR (SUTURE) ×2 IMPLANT
SUT VIC AB 2-0 CT1 27 (SUTURE) ×2
SUT VIC AB 2-0 CT1 TAPERPNT 27 (SUTURE) ×2 IMPLANT
SYR 30ML SLIP (SYRINGE) ×2 IMPLANT
TOWEL OR 17X24 6PK STRL BLUE (TOWEL DISPOSABLE) ×2 IMPLANT
TOWEL OR 17X26 10 PK STRL BLUE (TOWEL DISPOSABLE) ×2 IMPLANT
TRAY FOLEY CATH 14FR (SET/KITS/TRAYS/PACK) ×2 IMPLANT
WATER STERILE IRR 1000ML POUR (IV SOLUTION) ×6 IMPLANT

## 2011-06-01 NOTE — Transfer of Care (Signed)
Immediate Anesthesia Transfer of Care Note  Patient: [name]  Procedure(s) Performed:  TOTAL KNEE ARTHROPLASTY  Patient Location: PACU  Anesthesia Type: General  Level of Consciousness: awake and alert   Airway & Oxygen Therapy: Patient Spontanous Breathing and Patient connected to nasal cannula oxygen  Post-op Assessment: Report given to PACU RN and Post -op Vital signs reviewed and stable  Post vital signs: Reviewed and stable Filed Vitals:   06/01/11 0948  BP:   Pulse: 69  Temp:   Resp: 13    Complications: No apparent anesthesia complications

## 2011-06-01 NOTE — H&P (View-Only) (Signed)
[name] is an 78 y.o. male.   Chief Complaint: left knee end stage djd HPI: 73 yowm with endstage DJD left knee that has failed multiple cortisone injections, supartz injections, antiinflammatories, and narcotic pain meds.    Past Medical History  Diagnosis Date  . Hypertension   . Heart palpitations 2012  . Arthritis, lumbar spine   . Anxiety   . Arthritis   . BPH (benign prostatic hyperplasia)   . Edema of both legs   . Dysrhythmia     1st degree heart block  palpitations 04/07/2011    Past Surgical History  Procedure Date  . Total knee arthroplasty 2007    right total knee  . Total hip arthroplasty 2007    right total hip replacement  . Total shoulder arthroplasty 2007  . Back surgery 01/2011    Family History  Problem Relation Age of Onset  . Heart attack Mother   . Hypertension Mother   . Suicidality Father   . Heart disease Sister    Social History:  reports that he quit smoking about 27 years ago. He does not have any smokeless tobacco history on file. He reports that he drinks about .6 ounces of alcohol per week. He reports that he does not use illicit drugs.  Allergies:  Allergies  Allergen Reactions  . Hydrocodone Nausea And Vomiting    Medications Prior to Admission  Medication Sig Dispense Refill  . amLODipine (NORVASC) 5 MG tablet Take 5 mg by mouth daily.       Marland Kitchen aspirin EC 81 MG tablet Take 81 mg by mouth daily.        . cloNIDine (CATAPRES) 0.1 MG tablet Take 0.1 mg by mouth 2 (two) times daily.        Marland Kitchen docusate sodium (COLACE) 100 MG capsule Take 100 mg by mouth daily.       . finasteride (PROSCAR) 5 MG tablet Take 5 mg by mouth every morning.       . furosemide (LASIX) 40 MG tablet Take 40 mg by mouth daily.        . metoprolol (TOPROL-XL) 100 MG 24 hr tablet Take 100 mg by mouth every morning.       . Multiple Vitamins-Minerals (MULTIVITAMINS THER. W/MINERALS) TABS Take 1 tablet by mouth daily.        Marland Kitchen omega-3 acid ethyl esters  (LOVAZA) 1 G capsule Take 1 g by mouth daily.       . quinapril (ACCUPRIL) 40 MG tablet Take 40 mg by mouth daily.        Marland Kitchen spironolactone (ALDACTONE) 25 MG tablet Take 25 mg by mouth daily.        . traMADol (ULTRAM) 50 MG tablet Take 100 mg by mouth every 8 (eight) hours as needed. For pain Maximum dose= 8 tablets per day       No current facility-administered medications on file as of 05/26/2011.    No results found for this or any previous visit (from the past 48 hour(s)). No results found.  Review of Systems  Constitutional: Negative.   HENT: Negative.   Eyes: Negative.   Respiratory: Negative.   Cardiovascular: Positive for palpitations and leg swelling. Negative for chest pain, orthopnea, claudication and PND.  Gastrointestinal: Negative.   Genitourinary: Negative.   Musculoskeletal: Positive for joint pain.       Left knee joint  Skin: Negative.   Neurological: Negative.   Endo/Heme/Allergies: Bruises/bleeds easily.  Psychiatric/Behavioral: Negative.  Blood pressure 165/81, pulse 75, temperature 97.9 F (36.6 C), height 5\' 10"  (1.778 m), weight 120.203 kg (265 lb), SpO2 95.00%. Physical Exam  Constitutional: He is oriented to person, place, and time. He appears well-developed and well-nourished.  HENT:  Head: Normocephalic and atraumatic.  Mouth/Throat: Oropharynx is clear and moist.  Eyes: Conjunctivae and EOM are normal. Pupils are equal, round, and reactive to light.  Neck: Neck supple.  Cardiovascular: Normal rate, regular rhythm, normal heart sounds and intact distal pulses.   Respiratory: Effort normal and breath sounds normal.  GI: Soft. Bowel sounds are normal.  Genitourinary:       Not pertinent to current symptomatology therefore not examined.  Musculoskeletal: He exhibits edema and tenderness.       Examination of his left knee reveals 1 to 2+ crepitation 1+ synovitis, range of motion 0-120 degrees, knee is stable with diffuse pain and mild varus  deformity. Exam of the right knee reveals well healed total knee incision without swelling or pain. Vascular exam: pulses 2+ and symmetric.  Neurological: He is alert and oriented to person, place, and time. He has normal reflexes.  Skin: Skin is warm and dry.  Psychiatric: He has a normal mood and affect. His behavior is normal. Judgment and thought content normal.     Assessment Patient Active Problem List  Diagnoses  . Tachycardia  . Hypertension  . BPH (benign prostatic hyperplasia)  . DJD (degenerative joint disease)    Plan I talked to him about this in detail. I do think at this point that he's ready to consider left total knee replacement. Discussed risks benefits and possible complications of the surgery in detail and he understands this completely. He is cleared preoperatively by Dr. Pete Glatter and Dr Armanda Magic. The patient's clinical condition is marked by substantial pain and/or significant functional disability. Other conservative therapy has not provided relief, is contraindicated, or not appropriate. The risks, benefits, and possible complications of the procedure were discussed in detail with the patient.  The patient is without question.   Lashia Niese J 05/26/2011, 4:14 PM

## 2011-06-01 NOTE — Preoperative (Signed)
Beta Blockers   Reason not to administer Beta Blockers:Metoprolol 0515 05/31/2012

## 2011-06-01 NOTE — Anesthesia Procedure Notes (Addendum)
Anesthesia Regional Block:  Femoral nerve block  Pre-Anesthetic Checklist: ,, timeout performed, Correct Patient, Correct Site, Correct Laterality, Correct Procedure,, site marked, risks and benefits discussed, Surgical consent,  Pre-op evaluation,  At surgeon's request and post-op pain management  Laterality: Left  Prep: chloraprep       Needles:  Injection technique: Single-shot  Needle Type: Echogenic Stimulator Needle     Needle Length: 9cm  Needle Gauge: 21    Additional Needles:  Procedures: ultrasound guided and nerve stimulator Femoral nerve block  Nerve Stimulator or Paresthesia:  Response: Quadriceps muscle contraction, 0.45 mA,   Additional Responses:   Narrative:  Start time: 06/01/2011 9:30 AM End time: 06/01/2011 9:45 AM Injection made incrementally with aspirations every 5 mL.  Performed by: Personally  Anesthesiologist: Arta Bruce MD  Additional Notes: Functioning IV was confirmed and monitors were applied.  A 90mm 21ga Arrow echogenic stimulator needle was used. Sterile prep and drape,hand hygiene and sterile gloves were used.  Negative aspiration and negative test dose prior to incremental administration of local anesthetic. The patient tolerated the procedure well.    Femoral nerve block Procedure Name: LMA Insertion Date/Time: 06/01/2011 10:18 AM Performed by: Darcey Nora Pre-anesthesia Checklist: Patient identified, Emergency Drugs available, Suction available and Patient being monitored Patient Re-evaluated:Patient Re-evaluated prior to inductionOxygen Delivery Method: Circle System Utilized Preoxygenation: Pre-oxygenation with 100% oxygen Intubation Type: IV induction Ventilation: Mask ventilation without difficulty LMA: LMA inserted LMA Size: 5.0 Placement Confirmation: positive ETCO2 and breath sounds checked- equal and bilateral Tube secured with: Tape Dental Injury: Teeth and Oropharynx as per pre-operative assessment

## 2011-06-01 NOTE — Brief Op Note (Signed)
06/01/2011  11:48 AM  PATIENT:  [name]  78 y.o. male  PRE-OPERATIVE DIAGNOSIS:  Degenerative joint disease LEFT KNEE  POST-OPERATIVE DIAGNOSIS:  degenerative joint disease left knee  PROCEDURE:  Procedure(s): TOTAL KNEE ARTHROPLASTY LEFT  SURGEON:  Surgeon(s): Nilda Simmer, MD  PHYSICIAN ASSISTANT: Julien Girt PA-C  ASSISTANTS: Julien Girt PA-C   ANESTHESIA:   general  EBL:  Total I/O In: -  Out: 100 [Blood:100]  BLOOD ADMINISTERED:none  DRAINS: HEMOVAC LEFT KNEE CLAMPED   LOCAL MEDICATIONS USED:  NONE  SPECIMEN:  No Specimen  DISPOSITION OF SPECIMEN:  N/A  COUNTS:  YES  TOURNIQUET:  * Missing tourniquet times found for documented tourniquets in log:  11056 *  DICTATION: .Note written in EPIC  PLAN OF CARE: Admit to inpatient   PATIENT DISPOSITION:  PACU - hemodynamically stable.   Delay start of Pharmacological VTE agent (>24hrs) due to surgical blood loss or risk of bleeding: NO

## 2011-06-01 NOTE — Progress Notes (Addendum)
Pt is s/p L TKR. Knee precautions. +CMS. Pt is to be WBAT tomorrow with PT. Pt has a dry ace dressing to L knee with ice. Pt has a hemovac to L knee that is draining only scant bldy drainage due to it is currently clamped for four hours per MD order. Pt is alert and oriented x 3. Neuro check is negative. Pt has a foley that is draining mod amount of clear pale yellow urine. Pt lungs CTA. Heart rate regular rate and rhythm.  No s/sx resp or cardiac distress and no c/o such. Vital signs stable. Abdomen soft flat nontender and nondistended. BS+x4. Pt denies nausea or vomiting or passing gas. Pt repts LBM 1/14.  KI at bedside. IS given with verbal instruction. Pt is able to return demonstrate proper usage. Pt foley draining mod amount of amber pale yellow urine.

## 2011-06-01 NOTE — Progress Notes (Signed)
CPM applied at 1350.

## 2011-06-01 NOTE — Interval H&P Note (Signed)
History and Physical Interval Note:  06/01/2011 10:08 AM  [name]  has presented today for surgery, with the diagnosis of DJD LEFT KNEE  The various methods of treatment have been discussed with the patient and family. After consideration of risks, benefits and other options for treatment, the patient has consented to  Procedure(s): TOTAL KNEE ARTHROPLASTY LEFT as a surgical intervention .  The patients' history has been reviewed, patient examined, no change in status, stable for surgery.  I have reviewed the patients' chart and labs.  Questions were answered to the patient's satisfaction.     Salvatore Marvel A

## 2011-06-01 NOTE — Progress Notes (Signed)
Nausea experienced within 10 minutes of dilaudid IV administration.

## 2011-06-01 NOTE — Progress Notes (Signed)
ekg  And cxr in epic 

## 2011-06-01 NOTE — Anesthesia Preprocedure Evaluation (Addendum)
Anesthesia Evaluation  Patient identified by MRN, date of birth, ID band Patient awake    Reviewed: Allergy & Precautions, H&P , NPO status , Patient's Chart, lab work & pertinent test results, reviewed documented beta blocker date and time   Airway Mallampati: II TM Distance: >3 FB Neck ROM: Full    Dental  (+) Edentulous Upper and Partial Lower   Pulmonary          Cardiovascular hypertension, Pt. on home beta blockers and Pt. on medications + dysrhythmias     Neuro/Psych    GI/Hepatic   Endo/Other    Renal/GU      Musculoskeletal   Abdominal   Peds  Hematology   Anesthesia Other Findings   Reproductive/Obstetrics                          Anesthesia Physical Anesthesia Plan  ASA: II  Anesthesia Plan: General and General ETT   Post-op Pain Management: MAC Combined w/ Regional for Post-op pain   Induction:   Airway Management Planned:   Additional Equipment:   Intra-op Plan:   Post-operative Plan:   Informed Consent: I have reviewed the patients History and Physical, chart, labs and discussed the procedure including the risks, benefits and alternatives for the proposed anesthesia with the patient or authorized representative who has indicated his/her understanding and acceptance.     Plan Discussed with: CRNA and Surgeon  Anesthesia Plan Comments:         Anesthesia Quick Evaluation

## 2011-06-02 ENCOUNTER — Encounter (HOSPITAL_COMMUNITY): Payer: Self-pay | Admitting: Orthopedic Surgery

## 2011-06-02 ENCOUNTER — Inpatient Hospital Stay (HOSPITAL_COMMUNITY): Payer: Medicare Other

## 2011-06-02 LAB — DIFFERENTIAL
Basophils Relative: 0 % (ref 0–1)
Lymphs Abs: 1.9 10*3/uL (ref 0.7–4.0)
Monocytes Absolute: 1.6 10*3/uL — ABNORMAL HIGH (ref 0.1–1.0)
Monocytes Relative: 17 % — ABNORMAL HIGH (ref 3–12)
Neutro Abs: 5.2 10*3/uL (ref 1.7–7.7)

## 2011-06-02 LAB — CBC
HCT: 32.9 % — ABNORMAL LOW (ref 39.0–52.0)
Hemoglobin: 11.1 g/dL — ABNORMAL LOW (ref 13.0–17.0)
MCH: 31.9 pg (ref 26.0–34.0)
MCHC: 33.7 g/dL (ref 30.0–36.0)

## 2011-06-02 LAB — BASIC METABOLIC PANEL
BUN: 17 mg/dL (ref 6–23)
GFR calc non Af Amer: 78 mL/min — ABNORMAL LOW (ref >90)
Glucose, Bld: 115 mg/dL — ABNORMAL HIGH (ref 70–99)
Potassium: 4.8 mEq/L (ref 3.5–5.1)

## 2011-06-02 MED ORDER — DIPHENHYDRAMINE HCL 50 MG/ML IJ SOLN
INTRAMUSCULAR | Status: AC
  Filled 2011-06-02: qty 1

## 2011-06-02 MED ORDER — TRIAMCINOLONE ACETONIDE 0.025 % EX CREA
TOPICAL_CREAM | Freq: Two times a day (BID) | CUTANEOUS | Status: DC
  Administered 2011-06-02 – 2011-06-03 (×3): via TOPICAL
  Filled 2011-06-02 (×2): qty 15

## 2011-06-02 MED ORDER — DIPHENHYDRAMINE HCL 50 MG/ML IJ SOLN
25.0000 mg | Freq: Once | INTRAMUSCULAR | Status: AC
  Administered 2011-06-02: 09:00:00 via INTRAVENOUS

## 2011-06-02 MED ORDER — DIPHENHYDRAMINE HCL 25 MG PO CAPS
25.0000 mg | ORAL_CAPSULE | Freq: Four times a day (QID) | ORAL | Status: DC | PRN
  Administered 2011-06-03: 25 mg via ORAL
  Filled 2011-06-02: qty 1

## 2011-06-02 MED ORDER — LEVALBUTEROL HCL 1.25 MG/0.5ML IN NEBU
1.2500 mg | INHALATION_SOLUTION | Freq: Four times a day (QID) | RESPIRATORY_TRACT | Status: DC
  Administered 2011-06-02 (×3): 1.25 mg via RESPIRATORY_TRACT
  Filled 2011-06-02 (×4): qty 0.5

## 2011-06-02 NOTE — Anesthesia Postprocedure Evaluation (Signed)
  Anesthesia Post-op Note  Patient: [name]  Procedure(s) Performed:  TOTAL KNEE ARTHROPLASTY  Patient Location: PACU  Anesthesia Type: General  Level of Consciousness: awake  Airway and Oxygen Therapy: Patient connected to nasal cannula oxygen  Post-op Pain: moderate  Post-op Assessment: Post-op Vital signs reviewed  Post-op Vital Signs: stable  Complications: No apparent anesthesia complications

## 2011-06-02 NOTE — Op Note (Signed)
NAME:  [name], [name] NO.:  [id number]  MEDICAL RECORD NO.:  [id number]  LOCATION:  5031                         FACILITY:  [facility]  PHYSICIAN:  Elana Alm. Thurston Hole, M.D. DATE OF BIRTH:  02/25/1933  DATE OF PROCEDURE:  06/01/2011 DATE OF DISCHARGE:                              OPERATIVE REPORT   PREOPERATIVE DIAGNOSIS:  Left knee degenerative joint disease.  POSTOPERATIVE DIAGNOSIS:  Left knee degenerative joint disease.  PROCEDURE: 1. Left total knee replacement using DePuy cemented total knee system     with #4 cemented femur, #5 cemented tibia, with 15-mm polyethylene     RP tibial spacer, and 38-mm polyethylene cemented patella. 2. Zinacef impregnated cement.  SURGEON:  Elana Alm. Thurston Hole, MD  ASSISTANT:  Julien Girt, PA-C  ANESTHESIA:  General.  OPERATIVE TIME:  1 hour and 20 minutes.  COMPLICATIONS:  None.  DESCRIPTION OF PROCEDURE:  [name] was brought in the operating room on June 01, 2011, after a femoral nerve block was placed in the holding room by Anesthesia.  He was placed on operative table in supine position.  After being placed under general anesthesia, he had Ancef 2 g IV given preoperatively for prophylaxis.  He had a Foley catheter placed under sterile conditions.  His left knee was examined under anesthesia. Range of motion from -5 to 125 degrees, mild varus deformity, knee stable, ligamentous exam with normal patellar tracking.  Left leg was prepped using sterile DuraPrep and draped using sterile technique.  Time- out procedure was called and the correct left knee identified.  Left leg was exsanguinated and a tourniquet elevated at 365 mm.  Initially, through a 15-cm longitudinal incision based over the patella, initial exposure was made.  The underlying subcutaneous tissues were incised along with skin incision.  A median arthrotomy was performed revealing an excessive amount of normal-appearing joint fluid.  The  articular surfaces were inspected.  He had grade 4 changes medially, grade 3 and 4 changes laterally in the patellofemoral joint.  Osteophytes were removed from the femoral condyles and tibial plateau.  The medial and lateral meniscal remnants were removed as well as the anterior cruciate ligament.  Intramedullary drill was then drilled up the femoral canal for placement of distal femoral cutting jig, which was placed in the appropriate manner of rotation and a distal 11-mm cut was made.  The distal femur was incised.  #4 was found to be the appropriate size.  #4 cutting jig was placed in the appropriate manner of external rotation and then these cuts were made.  The proximal tibia was then exposed. The tibial spines were removed with an oscillating saw.  Intramedullary drill was drilled down the tibial canal for placement of the proximal tibial cutting jig, which was placed in the appropriate manner of rotation and a proximal 4 mm cut was made based off the medial or lower side.  Spacer blocks were then placed in flexion and extension.  15-mm blocks gave excellent balancing, excellent stability, and excellent correction of his flexion and varus deformities.  At this point, the #5 tibial base plate trial was placed on the cut tibial surface with an excellent fit and a keel cut was  made.  The PCL box cutter was then placed on the distal femur and these cuts were made.  At this point, the #4 femoral trial was placed and with the #5 tibial baseplate trial and a 15-mm polyethylene RP tibial spacer, knee was reduced, taken through range of motion from 0-125 degrees with excellent stability and excellent correction of his flexion and varus deformities and normal patellar tracking.  A resurfacing 10-mm cut was then made on the patella and 3 locking holes placed for a 38-mm polyethylene patellar trial and again, patellofemoral tracking was evaluated and found to be normal.  At this point, it was  felt that all the trial components were of excellent size, fit, and stability.  They were then removed.  The knee was then jet lavage irrigated with 3 L of saline.  The proximal tibia was then exposed, the #5 tibial base plate with Zinacef impregnated cement backing was hammered in position with an excellent fit, with excess cement being removed from around the edges.  #4 femoral component with cement backing was hammered in position also with an excellent fit, with excess cement being removed from around the edges.  The 15-mm polyethylene RP tibial spacer was placed on tibial base plate.  The knee reduced, taken through range of motion from 0-125 degrees with excellent stability and excellent correction of his flexion and varus deformities. The 38 mm polyethylene cement backed patella was then placed in its position and held there with a clamp.  After the cement hardened, again patellofemoral tracking was evaluated and found to be normal.  At this point, it was felt that all the components were of excellent size, fit, and stability.  The wound was further irrigated with saline and then the tourniquet was released.  Hemostasis obtained with cautery.  The arthrotomy was then closed with #1 Ethibond suture over 2 medium Hemovac drains.  Subcutaneous tissues closed with 0 and 2-0 Vicryl, subcuticular layer closed with 4-0 Monocryl.  Sterile dressings were applied and a long-leg splint, and the patient awakened, extubated, and taken to recovery room in stable condition.  Needle, sponge count was correct x2 at the end of the case.  Neurovascular status normal.  Pulses 2+ and symmetric.     [name] A. Thurston Hole, M.D.     RAW/MEDQ  D:  06/02/2011  T:  06/02/2011  Job:  [id number]

## 2011-06-02 NOTE — Progress Notes (Signed)
Physical Therapy Evaluation Patient Details Name: [name] MRN: [id number] DOB: 1932-11-15 Today's Date: 06/02/2011  Problem List:  Patient Active Problem List  Diagnoses  . Tachycardia  . Hypertension  . BPH (benign prostatic hyperplasia)  . DJD (degenerative joint disease)    Past Medical History:  Past Medical History  Diagnosis Date  . Hypertension   . Heart palpitations 2012  . Arthritis, lumbar spine   . Anxiety   . BPH (benign prostatic hyperplasia)   . Edema of both legs   . Dysrhythmia     1st degree heart block  palpitations 04/07/2011, seen & complete eval. /w Dr. Mayford Knife- stress, echo, holter monitor, has been cleared for surg.   . Arthritis     degenerative arthritis    Past Surgical History:  Past Surgical History  Procedure Date  . Total knee arthroplasty 2007    right total knee  . Total hip arthroplasty 2007    right total hip replacement  . Total shoulder arthroplasty 2007  . Back surgery 01/2011    02/2011  . Joint replacement     R knee replacement-  10/06, L shoulder - 3/07, R hip- replacement- '07  . Total knee arthroplasty 06/01/2011    Procedure: TOTAL KNEE ARTHROPLASTY;  Surgeon: Nilda Simmer, MD;  Location: [facility] OR;  Service: Orthopedics;  Laterality: Left;    PT Assessment/Plan/Recommendation PT Assessment 78 yo male s/p LTKA presents with decr functional mobility and increased fall risk; will benefit from acute PT to maximize independence and safety with mobility/transfers/amb/ facilitate dc planning to enable safe dc home; pt became anxious during initial OOB activity -- if slow progress, may need to consider SNF PT Recommendation/Assessment: Patient will need skilled PT in the acute care venue PT Problem List: Decreased strength;Decreased range of motion;Decreased mobility;Decreased activity tolerance;Decreased balance;Decreased coordination;Decreased knowledge of use of DME;Cardiopulmonary status limiting activity;Pain Barriers to  Discharge:  (fall risk) PT Therapy Diagnosis : Difficulty walking;Abnormality of gait;Acute pain PT Plan PT Frequency: 7X/week PT Treatment/Interventions: DME instruction;Gait training;Stair training;Functional mobility training;Therapeutic activities;Therapeutic exercise;Balance training;Patient/family education PT Recommendation Recommendations for Other Services: OT consult Follow Up Recommendations: Home health PT (if slow to progress, must consider a rehab setting) Equipment Recommended: None recommended by PT (pretty well-equipped) PT Goals  Acute Rehab PT Goals PT Goal Formulation: With patient Time For Goal Achievement: 7 days Pt will go Supine/Side to Sit: with supervision Pt will go Sit to Supine/Side: with supervision Pt will go Sit to Stand: with supervision Pt will go Stand to Sit: with supervision Pt will Transfer Bed to Chair/Chair to Bed: with supervision Pt will Ambulate: 51 - 150 feet;with min assist;with rolling walker Pt will Go Up / Down Stairs: 1-2 stairs;with min assist;with rolling walker Pt will Perform Home Exercise Program: with supervision, verbal cues required/provided  PT Evaluation Precautions/Restrictions  Precautions Precautions: Knee Required Braces or Orthoses: Yes Knee Immobilizer: On when out of bed or walking Restrictions Weight Bearing Restrictions: Yes LUE Weight Bearing: Weight bearing as tolerated Prior Functioning  Home Living Lives With: Alone Receives Help From: Friend(s) (24 hour assist) Type of Home: House Home Layout: One level Home Access: Stairs to enter Entrance Stairs-Rails: None Entrance Stairs-Number of Steps: 1 Bathroom Shower/Tub: Health visitor: Handicapped height Home Adaptive Equipment: Bedside commode/3-in-1;Walker - rolling;Reacher Prior Function Level of Independence: Independent with homemaking with ambulation Cognition Cognition Arousal/Alertness: Awake/alert Overall Cognitive Status:  Appears within functional limits for tasks assessed Orientation Level: Oriented X4 Sensation/Coordination Sensation Light Touch:  Appears Intact Coordination Gross Motor Movements are Fluid and Coordinated: No Fine Motor Movements are Fluid and Coordinated: Not tested Coordination and Movement Description: difficulty organizing trunk sitting EOB; significant posteroir lean in standing with difficulty organizing trunk and center of mass over base of support/feet Extremity Assessment RUE Assessment RUE Assessment: Within Functional Limits LUE Assessment LUE Assessment: Within Functional Limits RLE Assessment RLE Assessment: Within Functional Limits LLE Assessment LLE Assessment: Exceptions to [facility] LLE Strength LLE Overall Strength Comments: grossly decr AROM and strength limited by pain; positive quad activation and good initial straight leg raise Mobility (including Balance) Bed Mobility Bed Mobility: Yes Supine to Sit: 1: +2 Total assist;Patient percentage (comment) (pt=60%) Sitting - Scoot to Edge of Bed: 3: Mod assist Transfers Transfers: Yes Sit to Stand: 3: Mod assist;From bed Stand to Sit: 1: +2 Total assist;Patient percentage (comment) (pt=30%) Ambulation/Gait Ambulation/Gait: Yes Ambulation/Gait Assistance: 1: +2 Total assist;Patient percentage (comment) (pt=60% then decreasing to 40%) Assistive device: Rolling walker Gait Pattern: Decreased step length - right;Decreased step length - left    Exercise  Total Joint Exercises Quad Sets: AROM;Left;10 reps;Supine Heel Slides: AAROM;Left;5 reps;Supine Straight Leg Raises: AAROM;Left;5 reps (minimal quad lag) End of Session PT - End of Session Equipment Utilized During Treatment: Gait belt;Left knee immobilizer Activity Tolerance: Patient limited by fatigue Patient left: in chair;with call bell in reach (replaced O2 via [place]) Nurse Communication: Mobility status for transfers;Mobility status for ambulation General Behavior  During Session:  (somewhat anxious during amb) Cognition: WFL for tasks performed  Van Clines [facility] [place], [place] [phone]   06/02/2011, 1:19 PM

## 2011-06-02 NOTE — Progress Notes (Signed)
Referred to CSW for ?SNF. Will plan to f/u with patient tomorrow to assess and assist with d/c planning. Reece Levy, MSW, [place]ors [phone]

## 2011-06-02 NOTE — Progress Notes (Signed)
Orthopedic Tech Progress Note Patient Details:  [name] June 28, 1932 [id number]  Other Ortho Devices Type of Ortho Device: Knee Immobilizer Ortho Device Location: left knee immobilizer   Cammer, Mickie Bail 06/02/2011, 8:55 AM

## 2011-06-02 NOTE — Progress Notes (Signed)
Physical Therapy Treatment Patient Details Name: [name] MRN: [id number] DOB: 1932-08-23 Today's Date: 06/02/2011  PT Assessment/Plan  PT - Assessment/Plan Comments on Treatment Session: Much improved over am session, though still requiring quite a bit of assistance; pt is agreeable to SNF for rehab, PT in agreement, prefers [facility] PT Plan: Discharge plan needs to be updated PT Frequency: 7X/week Follow Up Recommendations: Skilled nursing facility Equipment Recommended: Defer to next venue PT Goals  Acute Rehab PT Goals Time For Goal Achievement: 7 days Pt will go Supine/Side to Sit: with supervision PT Goal: Supine/Side to Sit - Progress: Progressing toward goal Pt will go Sit to Supine/Side: with supervision PT Goal: Sit to Supine/Side - Progress: Progressing toward goal Pt will go Sit to Stand: with supervision PT Goal: Sit to Stand - Progress: Progressing toward goal Pt will go Stand to Sit: with supervision PT Goal: Stand to Sit - Progress: Progressing toward goal Pt will Transfer Bed to Chair/Chair to Bed: with supervision PT Transfer Goal: Bed to Chair/Chair to Bed - Progress: Progressing toward goal Pt will Ambulate: 51 - 150 feet;with min assist;with rolling walker PT Goal: Ambulate - Progress: Progressing toward goal Pt will Go Up / Down Stairs: 1-2 stairs;with min assist;with rolling walker PT Goal: Up/Down Stairs - Progress: Other (comment) Pt will Perform Home Exercise Program: with supervision, verbal cues required/provided PT Goal: Perform Home Exercise Program - Progress: Other (comment)  PT Treatment Precautions/Restrictions  Precautions Precautions: Knee Required Braces or Orthoses: Yes Knee Immobilizer: On when out of bed or walking Restrictions Weight Bearing Restrictions: Yes LUE Weight Bearing: Weight bearing as tolerated LLE Weight Bearing: Weight bearing as tolerated Mobility (including Balance) Bed Mobility Bed Mobility: Yes Sit to  Supine: 1: +2 Total assist;Patient percentage (comment) (pt=60%) Sit to Supine - Details (indicate cue type and reason): physical assist to help LLE into bed Transfers Sit to Stand: With upper extremity assist;From chair/3-in-1;3: Mod assist (light mod assist) Sit to Stand Details (indicate cue type and reason): cues for safe hand placement Stand to Sit: 3: Mod assist;To bed;With upper extremity assist Stand to Sit Details: mod assist to control descent Ambulation/Gait Ambulation/Gait Assistance: 1: +2 Total assist;Patient percentage (comment) (pt=75%) Ambulation/Gait Assistance Details (indicate cue type and reason): cues for sequence; demo cues to push down into RW to organize body over feet/minimize posterior lean; cues to glide RW on floor rather than lifting it as lifting the RW seems to cause more loss of balance posteriorly Ambulation Distance (Feet): 15 Feet Assistive device: Rolling walker Gait Pattern: Antalgic      End of Session PT - End of Session Equipment Utilized During Treatment: Gait belt;Left knee immobilizer Activity Tolerance: Patient tolerated treatment well Patient left: in bed;in CPM;with call bell in reach Nurse Communication: Mobility status for transfers;Mobility status for ambulation General Behavior During Session: [facility] for tasks performed Cognition: [facility] for tasks performed  Van Clines [facility] [place], [place] [phone]  06/02/2011, 5:08 PM

## 2011-06-02 NOTE — Progress Notes (Signed)
Patient ID: [name], male   DOB: 10-Mar-1933, 78 y.o.   MRN: [id number] PATIENT ID: [name]  MRN: [id number]  DOB/AGE:  25-Dec-1932 / 78 y.o.  1 Day Post-Op Procedure(s) (LRB): TOTAL KNEE ARTHROPLASTY (Left)    PROGRESS NOTE Subjective: Patient is alert, oriented, no Nausea, no Vomiting, yes passing gas, no Bowel Movement. Taking PO yes. Denies SOB, Chest or Calf Pain. Using Incentive Spirometer, PAS in place. Ambulate not yet , CPM 0-90 Patient reports pain as 4 on 0-10 scale  .    Objective: Vital signs in last 24 hours: Filed Vitals:   06/01/11 2112 06/02/11 0201 06/02/11 0559 06/02/11 0611  BP: 148/83 132/74 99/57 127/80  Pulse: 80 79 61 78  Temp: 97.8 F (36.6 C) 98.9 F (37.2 C) 97.9 F (36.6 C) 97.6 F (36.4 C)  TempSrc:      Resp: 18 20 20 20   SpO2: 94% 96% 100% 96%      Intake/Output from previous day: I/O last 3 completed shifts: In: 3775 [P.O.:400; I.V.:3175; IV Piggyback:200] Out: 1850 [Urine:1200; Drains:350; Blood:300]   Intake/Output this shift:     LABORATORY DATA:  Basename 06/02/11 0623  WBC 9.6  HGB 11.1*  HCT 32.9*  PLT 199  NA 132*  K 4.8  CL 97  CO2 28  BUN 17  CREATININE 0.99  GLUCOSE 115*  GLUCAP --  INR --  CALCIUM 8.4    Examination: ABD soft Neurovascular intact Sensation intact distally Intact pulses distally Dorsiflexion/Plantar flexion intact Incision: dressing C/D/I wheezing}  Assessment:   1 Day Post-Op Procedure(s) (LRB): TOTAL KNEE ARTHROPLASTY (Left) ADDITIONAL DIAGNOSIS:  Acute Blood Loss Anemia, Hypertension, Cardiac Arrythmia wheezing and   Plan: PT/OT WBAT, CPM 5/hrs day until ROM 0-90 degrees, then D/C CPM DVT Prophylaxis:  Lovenox\Coumadin bridge target INR 1.5-2.0 DISCHARGE PLAN: Home DISCHARGE NEEDS: HHPT Chest xray stat Respiratory therapy stat with nebulizer q 6 BNP Wedge under foot of bed benedryl 25 mg IV stat Had same rash last fall treated.  Was allergic to norvasc       [name] J 06/02/2011, 8:36 AM

## 2011-06-02 NOTE — Progress Notes (Signed)
UR COMPLETED  

## 2011-06-02 NOTE — Progress Notes (Signed)
Pt is S/P L TKR. Knee precautions. +cms. Pt is to be WBAT with RW and KI per order. CPM per order. Pt has a yellow foam boot to LLE per order. Pt has an ace dressing to LLE that is dry and intact. Ice prn L knee. Foley draining mod amount of clear yellow urine without difficulty. Pt repts that he had some intermittent nausea during the night that was relieved with IV anti emetics. BS+x4. Pt repts passing gas and denies nausea or vomiting now. Pt tolerates diet fair to good. Pt repts LBM 1/14. Abdomen soft flat nontender and nondistended. Pt repts SOB with min exertion. Pt lungs noted to have fine wheezes of all lobes. Lungs noted to be diminished in the bases. Pt performs IS per order. No s/sx resp distress at rest.  Pt sats 96% 2lpm. Pt repts nonprod cough. Dr. Thurston Hole in this AM to d/c hemovac. Heart rate regular rate and rhythm. No s/sx cardiac distress and no c/o such. Pt is also c/o itching and is noted to have a fine red dotty rash all over body. Will rept to PA/MD and obtain orders. Will continue to monitor.

## 2011-06-03 LAB — BASIC METABOLIC PANEL
BUN: 14 mg/dL (ref 6–23)
Calcium: 8.7 mg/dL (ref 8.4–10.5)
Creatinine, Ser: 1.01 mg/dL (ref 0.50–1.35)
GFR calc non Af Amer: 70 mL/min — ABNORMAL LOW (ref >90)
Glucose, Bld: 125 mg/dL — ABNORMAL HIGH (ref 70–99)
Sodium: 131 mEq/L — ABNORMAL LOW (ref 135–145)

## 2011-06-03 LAB — CBC
HCT: 29.4 % — ABNORMAL LOW (ref 39.0–52.0)
Hemoglobin: 10 g/dL — ABNORMAL LOW (ref 13.0–17.0)
MCH: 31.9 pg (ref 26.0–34.0)
MCHC: 34 g/dL (ref 30.0–36.0)
MCV: 93.9 fL (ref 78.0–100.0)

## 2011-06-03 MED ORDER — FUROSEMIDE 40 MG PO TABS
40.0000 mg | ORAL_TABLET | Freq: Every day | ORAL | Status: DC
  Administered 2011-06-03 – 2011-06-04 (×2): 40 mg via ORAL
  Filled 2011-06-03 (×2): qty 1

## 2011-06-03 MED ORDER — LEVALBUTEROL HCL 0.63 MG/3ML IN NEBU
0.6300 mg | INHALATION_SOLUTION | RESPIRATORY_TRACT | Status: DC | PRN
  Filled 2011-06-03: qty 3

## 2011-06-03 MED ORDER — HYDROMORPHONE HCL 2 MG PO TABS: 4.0000 mg | ORAL_TABLET | ORAL | Status: DC | PRN

## 2011-06-03 MED ORDER — LEVALBUTEROL HCL 1.25 MG/0.5ML IN NEBU
1.2500 mg | INHALATION_SOLUTION | Freq: Four times a day (QID) | RESPIRATORY_TRACT | Status: DC
  Administered 2011-06-03 – 2011-06-04 (×5): 1.25 mg via RESPIRATORY_TRACT
  Filled 2011-06-03 (×10): qty 0.5

## 2011-06-03 NOTE — Progress Notes (Signed)
Pt is s/p L TKR. Knee precautions. +cms. Pt is WBAT with RW and one assist. Pt noted to have 1+ nonpitting edema of LLE. CPM and KI per order. When pt not in CPM, pt has LLE in yellow foam boot. Ice prn to LLE. Genelle Bal PA in this AM to change pt's dressing. Ice prn to L knee. Pt yesterday had fine wheezes of all lung lobes with SOB with exertion. Pt lungs today noted to be CTA but diminished in the bases. Pt repts one episode of productive cough yesterday. Pt repts that his sputum was thin but he didn't see the color of the sputum. Pt performs IS per order. Heart rate regular rate and rhythm. No s/sx cardiac or resp distress and no c/o such. Vital signs stable. Pt repts LBM 1/14. Pt received sorbitol yesterday and today per order. Abdomen is soft flat nontender and nondistended. Pt repts passing gas. Pt tolerates diet fair. Pt repts that he has intermittent nausea with the smell of "some foods". Pt repts that the nausea subsides "on its own". Pt c/o his back itching and yesterday pt noted to have a fine dotty red rash to back. No rash noted today. Pt is receiving kenalog cream to area per order. Pt bil buttock noted to be blanchable red. Applying [place] cream to area. Pt also noted to have a small circular partial thickness area of L buttock. Area appears to be where a blister may have popped. Applying cream to that area as well. No pressure skin issues noted of heels. Pt encouraged to turn/tilt self every two hours to prevent skin breakdown. Floating heels.

## 2011-06-03 NOTE — Progress Notes (Signed)
Occupational Therapy Evaluation Patient Details Name: [name] MRN: [id number] DOB: 30-May-1932 Today's Date: 06/03/2011 15:28-16:08  evII Problem List:  Patient Active Problem List  Diagnoses  . Tachycardia  . Hypertension  . BPH (benign prostatic hyperplasia)  . DJD (degenerative joint disease)    Past Medical History:  Past Medical History  Diagnosis Date  . Hypertension   . Heart palpitations 2012  . Arthritis, lumbar spine   . Anxiety   . BPH (benign prostatic hyperplasia)   . Edema of both legs   . Dysrhythmia     1st degree heart block  palpitations 04/07/2011, seen & complete eval. /w Dr. Mayford Knife- stress, echo, holter monitor, has been cleared for surg.   . Arthritis     degenerative arthritis    Past Surgical History:  Past Surgical History  Procedure Date  . Total knee arthroplasty 2007    right total knee  . Total hip arthroplasty 2007    right total hip replacement  . Total shoulder arthroplasty 2007  . Back surgery 01/2011    02/2011  . Joint replacement     R knee replacement-  10/06, L shoulder - 3/07, R hip- replacement- '07  . Total knee arthroplasty 06/01/2011    Procedure: TOTAL KNEE ARTHROPLASTY;  Surgeon: Nilda Simmer, MD;  Location: [facility] OR;  Service: Orthopedics;  Laterality: Left;    OT Assessment/Plan/Recommendation OT Assessment Clinical Impression Statement: Pleasant 78 yr old man admitted for elective left TKA.  Currently needs min ti mod assist for LB selfcare and toileting.  Will benefit fromacute OT services to address these deficits in order to increase his independence with selfcare tasks.  Pt has only limited supervision at home, so feel he will benefit from  follow-up SNF for rehab. OT Recommendation/Assessment: Patient will need skilled OT in the acute care venue OT Problem List: Decreased strength;Decreased activity tolerance;Impaired balance (sitting and/or standing);Decreased knowledge of use of DME or AE;Pain;Cardiopulmonary  status limiting activity Barriers to Discharge: Decreased caregiver support OT Therapy Diagnosis : Generalized weakness;Acute pain OT Plan OT Frequency: Min 2X/week OT Treatment/Interventions: Self-care/ADL training;Therapeutic activities;DME and/or AE instruction;Balance training;Patient/family education OT Recommendation Follow Up Recommendations: Skilled nursing facility Equipment Recommended: Defer to next venue Individuals Consulted Consulted and Agree with Results and Recommendations: Patient OT Goals Acute Rehab OT Goals OT Goal Formulation: With patient Time For Goal Achievement: 7 days ADL Goals Pt Will Perform Grooming: with supervision;Standing at sink ADL Goal: Grooming - Progress: Goal set today Pt Will Perform Lower Body Bathing: with supervision;Sit to stand from chair;with adaptive equipment ADL Goal: Lower Body Bathing - Progress: Goal set today Pt Will Perform Lower Body Dressing: with supervision;Sit to stand from bed;with adaptive equipment ADL Goal: Lower Body Dressing - Progress: Goal set today Pt Will Transfer to Toilet: with supervision;Ambulation;with DME;3-in-1 ADL Goal: Toilet Transfer - Progress: Goal set today Pt Will Perform Toileting - Clothing Manipulation: with supervision;Sitting on 3-in-1 or toilet;Standing ADL Goal: Toileting - Clothing Manipulation - Progress: Goal set today Pt Will Perform Toileting - Hygiene: with supervision;Sit to stand from 3-in-1/toilet ADL Goal: Toileting - Hygiene - Progress: Goal set today  OT Evaluation Precautions/Restrictions  Precautions Precautions: Knee Required Braces or Orthoses: Yes Knee Immobilizer: On when out of bed or walking Restrictions Weight Bearing Restrictions: No LUE Weight Bearing: Weight bearing as tolerated LLE Weight Bearing: Weight bearing as tolerated Prior Functioning Home Living Lives With: Alone Receives Help From: Friend(s) (24 hour assist) Type of Home: House Home Layout: One  level Home Access: Stairs to enter Entrance Stairs-Rails: None Entrance Stairs-Number of Steps: 1 Bathroom Shower/Tub: Health visitor: Handicapped height Home Adaptive Equipment: Bedside commode/3-in-1;Walker - rolling;Reacher Prior Function Level of Independence: Independent with homemaking with ambulation ADL ADL Eating/Feeding: Simulated;Independent Where Assessed - Eating/Feeding: Chair Grooming: Simulated;Supervision/safety Where Assessed - Grooming: Standing at sink Upper Body Bathing: Simulated;Set up Where Assessed - Upper Body Bathing: Sitting, chair Lower Body Bathing: Simulated;Moderate assistance Where Assessed - Lower Body Bathing: Sit to stand from chair Upper Body Dressing: Simulated;Supervision/safety Where Assessed - Upper Body Dressing: Sitting, chair Lower Body Dressing: Simulated;Moderate assistance Lower Body Dressing Details (indicate cue type and reason): Pt with history of back problems unable to tolerate bending . Where Assessed - Lower Body Dressing: Sit to stand from chair Toilet Transfer: Simulated;Minimal assistance Toilet Transfer Method: Ambulating Toilet Transfer Equipment: Raised toilet seat with arms (or 3-in-1 over toilet) Toileting - Clothing Manipulation: Simulated;Supervision/safety Where Assessed - Toileting Clothing Manipulation: Sit to stand from 3-in-1 or toilet Toileting - Hygiene: Simulated;Supervision/safety Where Assessed - Toileting Hygiene: Sit to stand from 3-in-1 or toilet Tub/Shower Transfer: Not assessed Tub/Shower Transfer Method: Not assessed Equipment Used: Rolling walker Ambulation Related to ADLs: Pt needs min instructional cues for bigger step length.  Able to ambulate to the bathroom with min guard assist.  Dyspnea 2/4 with this task and O2 SATs decreased to 85 % on room air. ADL Comments: Pt with slight decreased ability to perform functional transfers, needs min assist.  Will also benefit from continued AE  practice for LB selfcare secondary to having a history of back pain. Vision/Perception  Vision - History Baseline Vision: Wears glasses all the time Patient Visual Report: No change from baseline Cognition Cognition Arousal/Alertness: Awake/alert Overall Cognitive Status: Appears within functional limits for tasks assessed Orientation Level: Oriented X4 Sensation/Coordination Sensation Light Touch: Appears Intact Stereognosis: Not tested Hot/Cold: Not tested Proprioception: Not tested Extremity Assessment RUE Assessment RUE Assessment: Within Functional Limits LUE Assessment LUE Assessment: Within Functional Limits (Pt's shoulder strength 3+/5, all others 4+/5.) Mobility  Bed Mobility Bed Mobility: No Transfers Transfers: Yes Sit to Stand: 4: Min assist;From chair/3-in-1;With upper extremity assist;With armrests Sit to Stand Details (indicate cue type and reason): cues for safe transfer Stand to Sit: 4: Min assist;Without upper extremity assist;With armrests Stand to Sit Details: cues for safety, hand placement, and physical assist for control of descent; pt with decresed control of descent despit cues   End of Session OT - End of Session Equipment Utilized During Treatment: Left knee immobilizer;Other (comment) Adult nurse) Activity Tolerance: Patient limited by fatigue Patient left: in chair;with call bell in reach;with family/visitor present General Behavior During Session: [facility] for tasks performed Cognition: [facility] for tasks performed   Jackee Glasner OTR/L 06/03/2011, 4:54 PM  Pager number [phone]

## 2011-06-03 NOTE — Progress Notes (Signed)
Physical Therapy Treatment Patient Details Name: [name] MRN: [id number] DOB: 1933/04/10 Today's Date: 06/03/2011  PT Assessment/Plan  PT - Assessment/Plan Comments on Treatment Session: Overall improvements; still need to work on quad activation in stance for stability PT Plan: Discharge plan remains appropriate PT Frequency: 7X/week Follow Up Recommendations: Skilled nursing facility Equipment Recommended: Defer to next venue PT Goals  Acute Rehab PT Goals Time For Goal Achievement: 7 days Pt will go Supine/Side to Sit: with supervision PT Goal: Supine/Side to Sit - Progress: Other (comment) Pt will go Sit to Supine/Side: with supervision PT Goal: Sit to Supine/Side - Progress: Other (comment) Pt will go Sit to Stand: with supervision PT Goal: Sit to Stand - Progress: Progressing toward goal Pt will go Stand to Sit: with supervision PT Goal: Stand to Sit - Progress: Progressing toward goal Pt will Transfer Bed to Chair/Chair to Bed: with supervision PT Transfer Goal: Bed to Chair/Chair to Bed - Progress: Other (comment) Pt will Ambulate: 51 - 150 feet;with min assist;with rolling walker PT Goal: Ambulate - Progress: Progressing toward goal Pt will Go Up / Down Stairs: 1-2 stairs;with min assist;with rolling walker PT Goal: Up/Down Stairs - Progress: Other (comment) Pt will Perform Home Exercise Program: with supervision, verbal cues required/provided PT Goal: Perform Home Exercise Program - Progress: Progressing toward goal  PT Treatment Precautions/Restrictions  Precautions Precautions: Knee Required Braces or Orthoses: Yes Knee Immobilizer: On when out of bed or walking Restrictions Weight Bearing Restrictions: Yes LUE Weight Bearing: Weight bearing as tolerated LLE Weight Bearing: Weight bearing as tolerated Mobility (including Balance) Transfers Sit to Stand: 4: Min assist;With upper extremity assist;From chair/3-in-1;With armrests Sit to Stand Details  (indicate cue type and reason): cues for safe transfer Stand to Sit: 3: Mod assist;With armrests;To chair/3-in-1 Stand to Sit Details: cues for safety, hand placement, and physical assist for control of descent; pt with decresed control of descent despit cues Ambulation/Gait Ambulation/Gait Assistance: 1: +2 Total assist;Patient percentage (comment) (pt=85%) Ambulation/Gait Assistance Details (indicate cue type and reason): Much improved amb today! Overall better balance with cues to keep RW on floor and glide it to advance, rather than picking it up, leading to loss of balance; cues to activate quad the stance stability -- noted occasional knee buckle in stance Ambulation Distance (Feet): 45 Feet Assistive device: Rolling walker Gait Pattern: Decreased stance time - left    Exercise  Total Joint Exercises Quad Sets: AROM;Left;10 reps (in recliner) Heel Slides: AAROM;Left;10 reps (in recliner) Straight Leg Raises: AAROM;Left;5 reps Long Arc Quad: AROM;Left;10 reps;Seated End of Session PT - End of Session Equipment Utilized During Treatment: Gait belt Activity Tolerance: Patient tolerated treatment well Patient left: in chair;with call bell in reach General Behavior During Session: [facility] for tasks performed Cognition: [facility] for tasks performed  Van Clines [facility] [place], [place] [phone]  06/03/2011, 3:39 PM

## 2011-06-03 NOTE — Progress Notes (Signed)
Patient requesting SNF at d/c. I will fax FL2 to [facility]. Message left to confirm bed offer. CSW Psychosocial Assessment and FL2 placed in shadow chart in [facility]aroo. Will proceed with SNF search and advise. Reece Levy, MSW, [facility] [phone]

## 2011-06-03 NOTE — Progress Notes (Signed)
Patient ID: [name], male   DOB: Jul 30, 1932, 78 y.o.   MRN: [id number] PATIENT ID: [name]  MRN: [id number]  DOB/AGE:  1933/04/06 / 78 y.o.  2 Days Post-Op Procedure(s) (LRB): TOTAL KNEE ARTHROPLASTY (Left)    PROGRESS NOTE Subjective: Patient is alert, oriented, no Nausea, no Vomiting, yes passing gas, no Bowel Movement. Taking PO ok. Denies SOB, Chest or Calf Pain. Using Incentive Spirometer, PAS in place. Ambulate minimally, CPM 0-60  patient reports pain as 5 on 0-10 scale  .    Objective: Vital signs in last 24 hours: Filed Vitals:   06/02/11 2035 06/02/11 2200 06/03/11 0534 06/03/11 0539  BP:  120/80 136/76 136/76  Pulse:  82  90  Temp:  98.7 F (37.1 C)  98.8 F (37.1 C)  TempSrc:  Oral  Oral  Resp:  18  18  SpO2: 92% 95%  92%      Intake/Output from previous day: I/O last 3 completed shifts: In: 2680 [P.O.:1080; I.V.:1500; IV Piggyback:100] Out: 2390 [Urine:1440; Drains:950]   Intake/Output this shift:     LABORATORY DATA:  Basename 06/03/11 0540 06/02/11 0623  WBC 9.4 9.6  HGB 10.0* 11.1*  HCT 29.4* 32.9*  PLT 160 199  NA 131* 132*  K 4.6 4.8  CL 98 97  CO2 28 28  BUN 14 17  CREATININE 1.01 0.99  GLUCOSE 125* 115*  GLUCAP -- --  INR -- --  CALCIUM 8.7 --    Examination: Neurologically intact ABD soft Neurovascular intact Sensation intact distally Intact pulses distally Dorsiflexion/Plantar flexion intact Incision: dressing C/D/I Compartment soft still wheezing.  improved since yesterday} No palpitations Assessment:   2 Days Post-Op Procedure(s) (LRB): TOTAL KNEE ARTHROPLASTY (Left) ADDITIONAL DIAGNOSIS:  Acute Blood Loss Anemia and BPH, anxiety  Plan: PT/OT WBAT, CPM 5/hrs day until ROM 0-90 degrees, then D/C CPM DVT Prophylaxis:  Lovenox\Coumadin bridge target INR 1.5-2.0 DISCHARGE PLAN: Skilled Nursing Facility/Rehab Tomorrow.  D/C oxycodone Use PO dilaudid.  dressing changed today     Christapher Gillian  J 06/03/2011, 7:22 AM

## 2011-06-04 DIAGNOSIS — IMO0001 Reserved for inherently not codable concepts without codable children: Secondary | ICD-10-CM | POA: Diagnosis not present

## 2011-06-04 DIAGNOSIS — E871 Hypo-osmolality and hyponatremia: Secondary | ICD-10-CM | POA: Diagnosis present

## 2011-06-04 DIAGNOSIS — D62 Acute posthemorrhagic anemia: Secondary | ICD-10-CM | POA: Diagnosis not present

## 2011-06-04 DIAGNOSIS — J45909 Unspecified asthma, uncomplicated: Secondary | ICD-10-CM | POA: Diagnosis not present

## 2011-06-04 LAB — CBC
MCH: 32.1 pg (ref 26.0–34.0)
MCHC: 34.7 g/dL (ref 30.0–36.0)
Platelets: 183 10*3/uL (ref 150–400)
RDW: 12.4 % (ref 11.5–15.5)

## 2011-06-04 LAB — BASIC METABOLIC PANEL
Calcium: 8.6 mg/dL (ref 8.4–10.5)
Creatinine, Ser: 0.85 mg/dL (ref 0.50–1.35)
GFR calc non Af Amer: 83 mL/min — ABNORMAL LOW (ref >90)
Sodium: 133 mEq/L — ABNORMAL LOW (ref 135–145)

## 2011-06-04 MED ORDER — DOCUSATE SODIUM 100 MG PO CAPS: 100.0000 mg | ORAL_CAPSULE | Freq: Two times a day (BID) | ORAL | Status: DC

## 2011-06-04 MED ORDER — LEVALBUTEROL HCL 1.25 MG/0.5ML IN NEBU: 1.2500 mg | INHALATION_SOLUTION | Freq: Four times a day (QID) | RESPIRATORY_TRACT | Status: DC

## 2011-06-04 MED ORDER — ACETAMINOPHEN 325 MG PO TABS: 650.0000 mg | ORAL_TABLET | ORAL | Status: AC | PRN

## 2011-06-04 MED ORDER — TRIAMCINOLONE ACETONIDE 0.025 % EX CREA: TOPICAL_CREAM | Freq: Two times a day (BID) | CUTANEOUS | Status: AC

## 2011-06-04 MED ORDER — DIPHENHYDRAMINE HCL 25 MG PO CAPS: 25.0000 mg | ORAL_CAPSULE | Freq: Four times a day (QID) | ORAL | Status: AC | PRN

## 2011-06-04 MED ORDER — ENOXAPARIN SODIUM 30 MG/0.3ML ~~LOC~~ SOLN: 30.0000 mg | Freq: Two times a day (BID) | SUBCUTANEOUS | Status: DC

## 2011-06-04 NOTE — Progress Notes (Signed)
Patient accepts SNF bed at [facility] as they have a private room available. [facility] only has a semi-private. Plan transfer via EMS this afternoon. Patient agreeable to this plan. Reece Levy, MSW, [facility] [phone]

## 2011-06-04 NOTE — Discharge Summary (Signed)
Physician Discharge Summary  Patient ID: [name] MRN: [id number] DOB/AGE: July 18, 1932 78 y.o.  Admit date: 06/01/2011 Discharge date: 06/04/2011  Admission Diagnoses: Principal Problem:  *DJD (degenerative joint disease) Active Problems:  Tachycardia  Hypertension  BPH (benign prostatic hyperplasia)  Hyponatremia  Discharge Diagnoses:  Principal Problem:  *DJD (degenerative joint disease) Active Problems:  Tachycardia  Hypertension  BPH (benign prostatic hyperplasia)  Postoperative anemia due to acute blood loss  Asthma exacerbation, allergic  Hyponatremia   Discharged Condition: good  Hospital Course: 06/01/2011  Left femoral nerve block, left total knee replacement, CPM 0-90 in recovery room for a total of 6 hrs, 06/02/2011 woke up with acute rash and significant wheezing, chest xray normal, BNP 152, xopenex nebulizers order q 6 hrs, benedryl 25 mg IV given and then 25 mg po q 6 hrs scheduled for 48 hrs then prn, triamcinalone cream ordered for rash.  06/03/2011 oxycodone and dilaudid discontinued, pt places on tylenol 650 mg q 4 hrs prn pain.  Pt has done well on this the last 24 hrs.  Lasix resumed on 06/03/2011.  Renal function stable hyponatremia stable with a sodium of 133 up from 131 on admission.  06/04/2011 pt is ambulating well.  He moves with safety in mind.  Dressing changed.  Consults: none  Significant Diagnostic Studies:  Results for orders placed during the hospital encounter of 06/01/11 (from the past 72 hour(s))  CBC     Status: Abnormal   Collection Time   06/02/11  6:23 AM      Component Value Range Comment   WBC 9.6  4.0 - 10.5 (K/uL)    RBC 3.48 (*) 4.22 - 5.81 (MIL/uL)    Hemoglobin 11.1 (*) 13.0 - 17.0 (g/dL)    HCT 84.1 (*) 32.4 - 52.0 (%)    MCV 94.5  78.0 - 100.0 (fL)    MCH 31.9  26.0 - 34.0 (pg)    MCHC 33.7  30.0 - 36.0 (g/dL)    RDW 40.1  02.7 - 25.3 (%)    Platelets 199  150 - 400 (K/uL)   BASIC METABOLIC PANEL     Status: Abnormal     Collection Time   06/02/11  6:23 AM      Component Value Range Comment   Sodium 132 (*) 135 - 145 (mEq/L)    Potassium 4.8  3.5 - 5.1 (mEq/L)    Chloride 97  96 - 112 (mEq/L)    CO2 28  19 - 32 (mEq/L)    Glucose, Bld 115 (*) 70 - 99 (mg/dL)    BUN 17  6 - 23 (mg/dL)    Creatinine, Ser 6.64  0.50 - 1.35 (mg/dL)    Calcium 8.4  8.4 - 10.5 (mg/dL)    GFR calc non Af Amer 78 (*) >90 (mL/min)    GFR calc Af Amer 90 (*) >90 (mL/min)   DIFFERENTIAL     Status: Abnormal   Collection Time   06/02/11  8:42 AM      Component Value Range Comment   Neutrophils Relative 56  43 - 77 (%)    Neutro Abs 5.2  1.7 - 7.7 (K/uL)    Lymphocytes Relative 20  12 - 46 (%)    Lymphs Abs 1.9  0.7 - 4.0 (K/uL)    Monocytes Relative 17 (*) 3 - 12 (%)    Monocytes Absolute 1.6 (*) 0.1 - 1.0 (K/uL)    Eosinophils Relative 6 (*) 0 - 5 (%)  Eosinophils Absolute 0.6  0.0 - 0.7 (K/uL)    Basophils Relative 0  0 - 1 (%)    Basophils Absolute 0.0  0.0 - 0.1 (K/uL)   PRO B NATRIURETIC PEPTIDE     Status: Normal   Collection Time   06/02/11  8:55 AM      Component Value Range Comment   Pro B Natriuretic peptide (BNP) 151.5  0 - 450 (pg/mL)   CBC     Status: Abnormal   Collection Time   06/03/11  5:40 AM      Component Value Range Comment   WBC 9.4  4.0 - 10.5 (K/uL)    RBC 3.13 (*) 4.22 - 5.81 (MIL/uL)    Hemoglobin 10.0 (*) 13.0 - 17.0 (g/dL)    HCT 62.9 (*) 52.8 - 52.0 (%)    MCV 93.9  78.0 - 100.0 (fL)    MCH 31.9  26.0 - 34.0 (pg)    MCHC 34.0  30.0 - 36.0 (g/dL)    RDW 41.3  24.4 - 01.0 (%)    Platelets 160  150 - 400 (K/uL)   BASIC METABOLIC PANEL     Status: Abnormal   Collection Time   06/03/11  5:40 AM      Component Value Range Comment   Sodium 131 (*) 135 - 145 (mEq/L)    Potassium 4.6  3.5 - 5.1 (mEq/L)    Chloride 98  96 - 112 (mEq/L)    CO2 28  19 - 32 (mEq/L)    Glucose, Bld 125 (*) 70 - 99 (mg/dL)    BUN 14  6 - 23 (mg/dL)    Creatinine, Ser 2.72  0.50 - 1.35 (mg/dL)    Calcium 8.7   8.4 - 10.5 (mg/dL)    GFR calc non Af Amer 70 (*) >90 (mL/min)    GFR calc Af Amer 81 (*) >90 (mL/min)   CBC     Status: Abnormal   Collection Time   06/04/11  6:15 AM      Component Value Range Comment   WBC 9.6  4.0 - 10.5 (K/uL)    RBC 2.99 (*) 4.22 - 5.81 (MIL/uL)    Hemoglobin 9.6 (*) 13.0 - 17.0 (g/dL)    HCT 53.6 (*) 64.4 - 52.0 (%)    MCV 92.6  78.0 - 100.0 (fL)    MCH 32.1  26.0 - 34.0 (pg)    MCHC 34.7  30.0 - 36.0 (g/dL)    RDW 03.4  74.2 - 59.5 (%)    Platelets 183  150 - 400 (K/uL)   BASIC METABOLIC PANEL     Status: Abnormal   Collection Time   06/04/11  6:15 AM      Component Value Range Comment   Sodium 133 (*) 135 - 145 (mEq/L)    Potassium 4.2  3.5 - 5.1 (mEq/L)    Chloride 98  96 - 112 (mEq/L)    CO2 27  19 - 32 (mEq/L)    Glucose, Bld 122 (*) 70 - 99 (mg/dL)    BUN 10  6 - 23 (mg/dL)    Creatinine, Ser 6.38  0.50 - 1.35 (mg/dL)    Calcium 8.6  8.4 - 10.5 (mg/dL)    GFR calc non Af Amer 83 (*) >90 (mL/min)    GFR calc Af Amer >90  >90 (mL/min)     Treatments: IV hydration, antibiotics: Ancef, analgesia: tried dilaudid (too sedating) tried oxycodone (itching) pain is now well controlled on  tylenol, anticoagulation: LMW heparin, respiratory therapy: O2 and xopenex nebulizers q 6 hrs, therapies: PT, OT, RN and SW and surgery: left total knee replacement  Discharge Exam: Blood pressure 154/75, pulse 105, temperature 98.1 F (36.7 C), temperature source Oral, resp. rate 18, SpO2 97.00%. General appearance: alert, cooperative and appears stated age Resp: wheezes RUL and mild expiratory wheezes Extremities: left leg swollen.  mild erythema.  would well approximated Pulses: 2+ and symmetric Skin: Skin color, texture, turgor normal. No rashes or lesions or diffuse pin point puritic rash on back and arms.  putting triamcinalone on it Incision/Wound:  Well approximated small amount of drainage mild redness moderate swelling  Disposition: stable to skilled nursing  facility  Discharge Orders    Future Orders Please Complete By Expires   Diet - low sodium heart healthy      Call MD / Call 911      Comments:   If you experience chest pain or shortness of breath, CALL 911 and be transported to the hospital emergency room.  If you develope a fever above 101 F, pus (white drainage) or increased drainage or redness at the wound, or calf pain, call your surgeon's office.   Constipation Prevention      Comments:   Drink plenty of fluids.  Prune juice may be helpful.  You may use a stool softener, such as Colace (over the counter) 100 mg twice a day.  Use MiraLax (over the counter) for constipation as needed.   Increase activity slowly as tolerated      Weight Bearing as taught in Physical Therapy      Comments:   Weight bearing as tolerated.  Progress from walker to cane as soon as stable.   Discharge instructions      Comments:   Total Knee Replacement Care After Refer to this sheet in the next few weeks. These discharge instructions provide you with general information on caring for yourself after you leave the hospital. Your caregiver may also give you specific instructions. Your treatment has been planned according to the most current medical practices available, but unavoidable complications sometimes occur. If you have any problems or questions after discharge, please call your caregiver. Regaining a near full range of motion of your knee within the first 3 to 6 weeks after surgery is critical. HOME CARE INSTRUCTIONS  You may resume a normal diet and activities as directed. Perform exercises as directed.  You will receive physical therapy as directed by your caregiver.  Take showers instead of baths until informed otherwise.  Change bandages (dressings) if necessary or as directed.  Only take over-the-counter or prescription medicines for pain, discomfort, or fever as directed by your caregiver.  Eat a well-balanced diet.  Avoid lifting or driving  until you are instructed otherwise.  Make an appointment to see your caregiver for stitches (suture) or staple removal as directed.  If you have been sent home with a continuous passive motion machine (CPM machine), use as directed.  SEEK MEDICAL CARE IF: You have swelling of your calf or leg.  You develop shortness of breath or chest pain.  You have redness, swelling, or increasing pain in the wound.  There is pus or any unusual drainage coming from the surgical site.  You notice a bad smell coming from the surgical site or dressing.  The surgical site breaks open after sutures or staples have been removed.  There is persistent bleeding from the suture or staple line.  You are getting  worse or are not improving.  You have any other questions or concerns.  SEEK IMMEDIATE MEDICAL CARE IF:  You have a fever.  You develop a rash.  You have difficulty breathing.  You develop any reaction or side effects to medicines given.  Your knee motion is decreasing rather than improving.  MAKE SURE YOU:  Understand these instructions.  Will watch your condition.  Will get help right away if you are not doing well or get worse.  Document Released: 11/21/2004 Document Revised: 01/14/2011 Document Reviewed: 03/06/2009 [facility] Patient Information 2012 [place], [place].   CPM      Comments:   Continuous passive motion machine (CPM):      Use the CPM from 0 to 90 for 6 hours per day.      You may break it up into 2 or 3 sessions per day.      Use CPM for 2 weeks or until you are told to stop.   TED hose      Comments:   Use stockings (TED hose) for 2 weeks on both leg(s).  You may remove them at night for sleeping.   Change dressing      Comments:   Change the dressing daily with sterile 4 x 4 inch gauze dressing and apply TED hose.  You may clean the incision with alcohol prior to redressing.   Do not put a pillow under the knee. Place it under the heel.      Scheduling Instructions:   Place  yellow foam block, yellow side up under heel at all times except when in CPM or when walking.  DO NOT modify, tear, cut, or change in any way the yellow foam block.   Comments:   Place yellow foam block, yellow side up under heel at all times except when in CPM or when walking.  DO NOT modify, tear, cut, or change in any way the yellow foam block.      Medication List  As of 06/04/2011  8:22 AM   STOP taking these medications         aspirin EC 81 MG tablet      omega-3 acid ethyl esters 1 G capsule      traMADol 50 MG tablet         TAKE these medications         acetaminophen 325 MG tablet   Commonly known as: TYLENOL   Take 2 tablets (650 mg total) by mouth every 4 (four) hours as needed (or Fever >/= 101).      amLODipine 5 MG tablet   Commonly known as: NORVASC   Take 5 mg by mouth daily.      cloNIDine 0.1 MG tablet   Commonly known as: CATAPRES   Take 0.1 mg by mouth 2 (two) times daily.      diphenhydrAMINE 25 mg capsule   Commonly known as: BENADRYL   Take 1 capsule (25 mg total) by mouth every 6 (six) hours as needed for itching.      docusate sodium 100 MG capsule   Commonly known as: COLACE   Take 1 capsule (100 mg total) by mouth 2 (two) times daily.      enoxaparin 30 MG/0.3ML Soln   Commonly known as: LOVENOX   Inject 0.3 mLs (30 mg total) into the skin every 12 (twelve) hours.      finasteride 5 MG tablet   Commonly known as: PROSCAR   Take 5 mg by mouth  every morning.      furosemide 40 MG tablet   Commonly known as: LASIX   Take 40 mg by mouth daily.      levalbuterol 1.25 MG/0.5ML nebulizer solution   Commonly known as: XOPENEX   Take 1.25 mg by nebulization 4 (four) times daily.      metoprolol succinate 100 MG 24 hr tablet   Commonly known as: TOPROL-XL   Take 100 mg by mouth every morning.      multivitamins ther. w/minerals Tabs   Take 1 tablet by mouth daily.      quinapril 40 MG tablet   Commonly known as: ACCUPRIL   Take 40 mg  by mouth daily.      spironolactone 25 MG tablet   Commonly known as: ALDACTONE   Take 25 mg by mouth daily.      triamcinolone 0.025 % cream   Commonly known as: KENALOG   Apply topically 2 (two) times daily. For itching           Follow-up Information    Follow up with Nilda Simmer, MD on 06/15/2011.   Contact information:   [facility] [address], [address] [place] [address] [phone]         Ferlin Fairhurst A. Gwinda Passe Physician Assistant [facility] [phone]  06/04/2011, 8:40 AM Signed: Pascal Lux 06/04/2011, 8:22 AM

## 2011-06-04 NOTE — Progress Notes (Signed)
Physical Therapy Treatment Patient Details Name: JAELIN FACKLER MRN: [id number] DOB: 25-Mar-1933 Today's Date: 06/04/2011  PT Assessment/Plan  PT - Assessment/Plan Comments on Treatment Session: Pt making steady progress. Plans are for pt to d/c to [facility]-SNF today.  PT Plan: Discharge plan remains appropriate PT Frequency: 7X/week Follow Up Recommendations: Skilled nursing facility Equipment Recommended: Defer to next venue PT Goals  Acute Rehab PT Goals PT Goal: Supine/Side to Sit - Progress: Met PT Goal: Sit to Stand - Progress: Progressing toward goal PT Goal: Stand to Sit - Progress: Progressing toward goal PT Goal: Ambulate - Progress: Progressing toward goal PT Goal: Perform Home Exercise Program - Progress: Progressing toward goal  PT Treatment Precautions/Restrictions  Precautions Precautions: Knee Required Braces or Orthoses: Yes Knee Immobilizer: On when out of bed or walking Restrictions Weight Bearing Restrictions: Yes LUE Weight Bearing: Weight bearing as tolerated LLE Weight Bearing: Weight bearing as tolerated Mobility (including Balance) Bed Mobility Supine to Sit: 5: Supervision;HOB flat Supine to Sit Details (indicate cue type and reason): Cues for technique & use of UE's to increase ease of transition.   Sitting - Scoot to Edge of Bed: 5: Supervision Transfers Sit to Stand: Other (comment);From bed;With upper extremity assist (Min Guard (A)) Sit to Stand Details (indicate cue type and reason): cues for hand placement Stand to Sit: 4: Min assist;To chair/3-in-1 Stand to Sit Details: (A) to control descent; Cues for hand placement, LLE positioning, use of UE's to control descent.   Ambulation/Gait Ambulation/Gait Assistance: Other (comment) (Min Guard (A)) Ambulation/Gait Assistance Details (indicate cue type and reason): Cues for sequencing, increase/maintain upright posture, cervical extension.  No knee buckling noted today.  Encouragement to decrease  reliance of UE's on RW if tolerable.   Ambulation Distance (Feet): 120 Feet Assistive device: Rolling walker Gait Pattern: Step-to pattern;Antalgic;Decreased stance time - left;Decreased step length - right Stairs: No Wheelchair Mobility Wheelchair Mobility: No    Exercise  Total Joint Exercises Ankle Circles/Pumps: AROM;Both;10 reps;Supine Quad Sets: AROM;Strengthening;Both;10 reps;Supine Gluteal Sets: AROM;Strengthening;Both;10 reps;Supine Straight Leg Raises: Strengthening;Left;10 reps;Supine Knee Flexion: AAROM;Left;10 reps;Seated (limited by pain) End of Session PT - End of Session Equipment Utilized During Treatment: Gait belt Activity Tolerance: Patient tolerated treatment well Patient left: in chair;with call bell in reach General Behavior During Session: [facility] for tasks performed Cognition: [facility] for tasks performed  Lara Mulch 06/04/2011, 11:49 AM [phone]

## 2011-09-14 ENCOUNTER — Other Ambulatory Visit: Payer: Self-pay | Admitting: Neurosurgery

## 2011-09-14 DIAGNOSIS — M545 Low back pain, unspecified: Secondary | ICD-10-CM

## 2011-10-05 ENCOUNTER — Ambulatory Visit
Admission: RE | Admit: 2011-10-05 | Discharge: 2011-10-05 | Disposition: A | Discharge status: Home / Self Care | Payer: Medicare Other | Source: Ambulatory Visit | Attending: Neurosurgery | Admitting: Neurosurgery

## 2011-10-05 DIAGNOSIS — M545 Low back pain: Secondary | ICD-10-CM

## 2012-09-27 ENCOUNTER — Other Ambulatory Visit (HOSPITAL_COMMUNITY): Payer: Self-pay | Admitting: Orthopaedic Surgery

## 2012-09-30 ENCOUNTER — Encounter (HOSPITAL_COMMUNITY): Payer: Self-pay | Admitting: Pharmacy Technician

## 2012-10-06 ENCOUNTER — Inpatient Hospital Stay (HOSPITAL_COMMUNITY): Admission: RE | Admit: 2012-10-06 | Payer: Medicare Other | Source: Ambulatory Visit

## 2012-10-11 ENCOUNTER — Ambulatory Visit (HOSPITAL_COMMUNITY)
Admission: RE | Admit: 2012-10-11 | Discharge: 2012-10-11 | Disposition: A | Discharge status: Home / Self Care | Payer: Medicare Other | Source: Ambulatory Visit | Attending: Orthopaedic Surgery | Admitting: Orthopaedic Surgery

## 2012-10-11 ENCOUNTER — Encounter (HOSPITAL_COMMUNITY): Payer: Self-pay

## 2012-10-11 ENCOUNTER — Encounter (HOSPITAL_COMMUNITY)
Admission: RE | Admit: 2012-10-11 | Discharge: 2012-10-11 | Disposition: A | Discharge status: Home / Self Care | Payer: Medicare Other | Source: Ambulatory Visit | Attending: Orthopaedic Surgery | Admitting: Orthopaedic Surgery

## 2012-10-11 DIAGNOSIS — R9389 Abnormal findings on diagnostic imaging of other specified body structures: Secondary | ICD-10-CM | POA: Insufficient documentation

## 2012-10-11 DIAGNOSIS — Z87891 Personal history of nicotine dependence: Secondary | ICD-10-CM | POA: Insufficient documentation

## 2012-10-11 DIAGNOSIS — I771 Stricture of artery: Secondary | ICD-10-CM | POA: Insufficient documentation

## 2012-10-11 DIAGNOSIS — I10 Essential (primary) hypertension: Secondary | ICD-10-CM | POA: Insufficient documentation

## 2012-10-11 DIAGNOSIS — Z01818 Encounter for other preprocedural examination: Secondary | ICD-10-CM | POA: Insufficient documentation

## 2012-10-11 DIAGNOSIS — Z01812 Encounter for preprocedural laboratory examination: Secondary | ICD-10-CM | POA: Insufficient documentation

## 2012-10-11 DIAGNOSIS — Z0183 Encounter for blood typing: Secondary | ICD-10-CM | POA: Insufficient documentation

## 2012-10-11 LAB — BASIC METABOLIC PANEL
BUN: 25 mg/dL — ABNORMAL HIGH (ref 6–23)
Chloride: 99 mEq/L (ref 96–112)
GFR calc Af Amer: >90 mL/min (ref >90)
Potassium: 4.3 mEq/L (ref 3.5–5.1)

## 2012-10-11 LAB — PROTIME-INR
INR: 1.05 (ref 0.00–1.49)
Prothrombin Time: 13.6 seconds (ref 11.6–15.2)

## 2012-10-11 LAB — URINALYSIS, ROUTINE W REFLEX MICROSCOPIC
Glucose, UA: NEGATIVE mg/dL
Hgb urine dipstick: NEGATIVE
Specific Gravity, Urine: 1.007 (ref 1.005–1.030)
pH: 6.5 (ref 5.0–8.0)

## 2012-10-11 LAB — CBC
HCT: 40.8 % (ref 39.0–52.0)
Hemoglobin: 14.2 g/dL (ref 13.0–17.0)
MCHC: 34.8 g/dL (ref 30.0–36.0)
WBC: 5.9 10*3/uL (ref 4.0–10.5)

## 2012-10-11 LAB — SURGICAL PCR SCREEN
MRSA, PCR: NEGATIVE
Staphylococcus aureus: NEGATIVE

## 2012-10-11 LAB — ABO/RH: ABO/RH(D): A POS

## 2012-10-11 NOTE — Pre-Procedure Instructions (Addendum)
10-11-12 EKG/Stress 4'14/ ECho 12'13-reports with chart. CXR done today. Your Pt has screened with an elevated risk for obstructive sleep apnea using the Stop-Bang tool during a presurgical  Visit. A score of four or greater is an elevated risk.(faxed to office)

## 2012-10-11 NOTE — Progress Notes (Signed)
Your Pt has screened with an elevated risk for obstructive sleep apnea using the Stop-Bang tool during a presurgical  Visit. A score of four or greater is an elevated risk. 

## 2012-10-11 NOTE — Patient Instructions (Addendum)
20 ARAN MENNING  10/11/2012   Your procedure is scheduled on:   10-14-2012  Report to [facility] at      0900  AM.  Call this number if you have problems the morning of surgery: [phone]  Or Presurgical Testing [phone](Brehanna Deveny)      Do not eat food:After Midnight.  May have clear liquids:up to 6 Hours before arrival. Nothing after : 0600 am  Clear liquids include soda, tea, black coffee, apple or grape juice, broth.  Take these medicines the morning of surgery with A SIP OF WATER:Eplerenone. Amlodipine. Clonidine. Finasteride. Gabapentin. Metoprolol.    Do not wear jewelry, make-up or nail polish.  Do not wear lotions, powders, or perfumes. You may wear deodorant.  Do not shave 12 hours prior to first CHG shower(legs and under arms).(face and neck okay.)  Do not bring valuables to the hospital.  Contacts, dentures or bridgework,body piercing,  may not be worn into surgery.  Leave suitcase in the car. After surgery it may be brought to your room.  For patients admitted to the hospital, checkout time is 11:00 AM the day of discharge.   Patients discharged the day of surgery will not be allowed to drive home. Must have responsible person with you x 24 hours once discharged.  Name and phone number of your driver:Nicolas Merrilee Jansky- [phone] cell   Special Instructions: CHG(Chlorhedine 4%-"Hibiclens","Betasept","Aplicare") Shower Use Special Wash: see special instructions.(avoid face and genitals)   Please read over the following fact sheets that you were given: MRSA Information, Blood Transfusion fact sheet, Incentive Spirometry Instruction.    Failure to follow these instructions may result in Cancellation of your surgery.   Patient signature_______________________________________________________

## 2012-10-14 ENCOUNTER — Encounter (HOSPITAL_COMMUNITY): Payer: Self-pay | Admitting: Anesthesiology

## 2012-10-14 ENCOUNTER — Inpatient Hospital Stay (HOSPITAL_COMMUNITY): Payer: Medicare Other | Admitting: Anesthesiology

## 2012-10-14 ENCOUNTER — Inpatient Hospital Stay (HOSPITAL_COMMUNITY): Payer: Medicare Other

## 2012-10-14 ENCOUNTER — Encounter (HOSPITAL_COMMUNITY): Payer: Self-pay | Admitting: *Deleted

## 2012-10-14 ENCOUNTER — Inpatient Hospital Stay (HOSPITAL_COMMUNITY)
Admission: RE | Admit: 2012-10-14 | Discharge: 2012-10-16 | DRG: 470 | Disposition: A | Discharge status: Home Health | Payer: Medicare Other | Source: Ambulatory Visit | Attending: Orthopaedic Surgery | Admitting: Orthopaedic Surgery

## 2012-10-14 ENCOUNTER — Encounter (HOSPITAL_COMMUNITY)
Admission: RE | Disposition: A | Discharge status: Home Health | Payer: Self-pay | Source: Ambulatory Visit | Attending: Orthopaedic Surgery

## 2012-10-14 DIAGNOSIS — Z96659 Presence of unspecified artificial knee joint: Secondary | ICD-10-CM

## 2012-10-14 DIAGNOSIS — Z87891 Personal history of nicotine dependence: Secondary | ICD-10-CM

## 2012-10-14 DIAGNOSIS — M169 Osteoarthritis of hip, unspecified: Secondary | ICD-10-CM

## 2012-10-14 DIAGNOSIS — I10 Essential (primary) hypertension: Secondary | ICD-10-CM | POA: Diagnosis present

## 2012-10-14 DIAGNOSIS — Z96649 Presence of unspecified artificial hip joint: Secondary | ICD-10-CM

## 2012-10-14 DIAGNOSIS — M1610 Unilateral primary osteoarthritis, unspecified hip: Principal | ICD-10-CM | POA: Diagnosis present

## 2012-10-14 HISTORY — PX: TOTAL HIP ARTHROPLASTY: SHX124

## 2012-10-14 SURGERY — ARTHROPLASTY, HIP, TOTAL, ANTERIOR APPROACH
Anesthesia: Spinal | Site: Hip | Laterality: Left | Wound class: Clean

## 2012-10-14 MED ORDER — LACTATED RINGERS IV SOLN
INTRAVENOUS | Status: DC
  Administered 2012-10-14: 14:00:00 via INTRAVENOUS
  Administered 2012-10-14: 1000 mL via INTRAVENOUS

## 2012-10-14 MED ORDER — THERA M PLUS PO TABS: 1.0000 | ORAL_TABLET | Freq: Every day | ORAL | Status: DC

## 2012-10-14 MED ORDER — SODIUM CHLORIDE 0.9 % IR SOLN
Status: DC | PRN
  Administered 2012-10-14: 1000 mL

## 2012-10-14 MED ORDER — FENTANYL CITRATE 0.05 MG/ML IJ SOLN: 25.0000 ug | INTRAMUSCULAR | Status: DC | PRN

## 2012-10-14 MED ORDER — PROMETHAZINE HCL 25 MG/ML IJ SOLN: 6.2500 mg | INTRAMUSCULAR | Status: DC | PRN

## 2012-10-14 MED ORDER — AMLODIPINE BESYLATE 5 MG PO TABS
5.0000 mg | ORAL_TABLET | Freq: Every morning | ORAL | Status: DC
  Administered 2012-10-15 – 2012-10-16 (×2): 5 mg via ORAL
  Filled 2012-10-14 (×2): qty 1

## 2012-10-14 MED ORDER — QUINAPRIL HCL 10 MG PO TABS: 40.0000 mg | ORAL_TABLET | Freq: Every morning | ORAL | Status: DC

## 2012-10-14 MED ORDER — ZOLPIDEM TARTRATE 5 MG PO TABS: 5.0000 mg | ORAL_TABLET | Freq: Every evening | ORAL | Status: DC | PRN

## 2012-10-14 MED ORDER — OXYCODONE HCL 5 MG PO TABS
5.0000 mg | ORAL_TABLET | ORAL | Status: DC | PRN
Start: 2012-10-14 — End: 2012-10-16
  Administered 2012-10-14: 10 mg via ORAL
  Administered 2012-10-14 – 2012-10-15 (×3): 5 mg via ORAL
  Administered 2012-10-15: 10 mg via ORAL
  Administered 2012-10-16: 5 mg via ORAL
  Filled 2012-10-14: qty 1
  Filled 2012-10-14: qty 2
  Filled 2012-10-14 (×3): qty 1
  Filled 2012-10-14: qty 2

## 2012-10-14 MED ORDER — FINASTERIDE 5 MG PO TABS
5.0000 mg | ORAL_TABLET | Freq: Every day | ORAL | Status: DC
  Administered 2012-10-15 – 2012-10-16 (×2): 5 mg via ORAL
  Filled 2012-10-14 (×2): qty 1

## 2012-10-14 MED ORDER — MENTHOL 3 MG MT LOZG: 1.0000 | LOZENGE | OROMUCOSAL | Status: DC | PRN

## 2012-10-14 MED ORDER — METHOCARBAMOL 500 MG PO TABS
500.0000 mg | ORAL_TABLET | Freq: Four times a day (QID) | ORAL | Status: DC | PRN
  Administered 2012-10-16: 500 mg via ORAL
  Filled 2012-10-14: qty 1

## 2012-10-14 MED ORDER — 0.9 % SODIUM CHLORIDE (POUR BTL) OPTIME
TOPICAL | Status: DC | PRN
  Administered 2012-10-14: 1000 mL

## 2012-10-14 MED ORDER — DOCUSATE SODIUM 100 MG PO CAPS
100.0000 mg | ORAL_CAPSULE | Freq: Two times a day (BID) | ORAL | Status: DC
  Administered 2012-10-14 – 2012-10-16 (×3): 100 mg via ORAL
  Filled 2012-10-14 (×4): qty 1

## 2012-10-14 MED ORDER — SPIRONOLACTONE 25 MG PO TABS
25.0000 mg | ORAL_TABLET | Freq: Every day | ORAL | Status: DC
  Administered 2012-10-16: 25 mg via ORAL
  Filled 2012-10-14 (×2): qty 1

## 2012-10-14 MED ORDER — FUROSEMIDE 40 MG PO TABS
40.0000 mg | ORAL_TABLET | Freq: Every morning | ORAL | Status: DC
  Administered 2012-10-15 – 2012-10-16 (×2): 40 mg via ORAL
  Filled 2012-10-14 (×2): qty 1

## 2012-10-14 MED ORDER — MEPERIDINE HCL 50 MG/ML IJ SOLN: 6.2500 mg | INTRAMUSCULAR | Status: DC | PRN

## 2012-10-14 MED ORDER — BISACODYL 5 MG PO TBEC: 5.0000 mg | DELAYED_RELEASE_TABLET | Freq: Every day | ORAL | Status: DC | PRN

## 2012-10-14 MED ORDER — BUPIVACAINE HCL (PF) 0.5 % IJ SOLN
INTRAMUSCULAR | Status: DC | PRN
  Administered 2012-10-14: 3 mL

## 2012-10-14 MED ORDER — ACETAMINOPHEN 650 MG RE SUPP: 650.0000 mg | Freq: Four times a day (QID) | RECTAL | Status: DC | PRN

## 2012-10-14 MED ORDER — ACETAMINOPHEN 325 MG PO TABS
650.0000 mg | ORAL_TABLET | Freq: Four times a day (QID) | ORAL | Status: DC | PRN
  Administered 2012-10-16: 650 mg via ORAL
  Filled 2012-10-14: qty 2

## 2012-10-14 MED ORDER — TRAMADOL HCL 50 MG PO TABS: 100.0000 mg | ORAL_TABLET | Freq: Four times a day (QID) | ORAL | Status: DC | PRN

## 2012-10-14 MED ORDER — LISINOPRIL 40 MG PO TABS
40.0000 mg | ORAL_TABLET | Freq: Every day | ORAL | Status: DC
  Administered 2012-10-15 – 2012-10-16 (×2): 40 mg via ORAL
  Filled 2012-10-14 (×2): qty 1

## 2012-10-14 MED ORDER — ADULT MULTIVITAMIN W/MINERALS CH
1.0000 | ORAL_TABLET | Freq: Every day | ORAL | Status: DC
  Administered 2012-10-14 – 2012-10-16 (×3): 1 via ORAL
  Filled 2012-10-14 (×3): qty 1

## 2012-10-14 MED ORDER — PHENOL 1.4 % MT LIQD: 1.0000 | OROMUCOSAL | Status: DC | PRN

## 2012-10-14 MED ORDER — POLYETHYLENE GLYCOL 3350 17 G PO PACK
17.0000 g | PACK | Freq: Every day | ORAL | Status: DC | PRN
  Filled 2012-10-14: qty 1

## 2012-10-14 MED ORDER — METOPROLOL SUCCINATE ER 100 MG PO TB24
150.0000 mg | ORAL_TABLET | Freq: Every day | ORAL | Status: DC
  Administered 2012-10-15 – 2012-10-16 (×2): 150 mg via ORAL
  Filled 2012-10-14 (×2): qty 1

## 2012-10-14 MED ORDER — FENTANYL CITRATE 0.05 MG/ML IJ SOLN
INTRAMUSCULAR | Status: DC | PRN
  Administered 2012-10-14: 50 ug via INTRAVENOUS

## 2012-10-14 MED ORDER — DIPHENHYDRAMINE HCL 12.5 MG/5ML PO ELIX: 12.5000 mg | ORAL_SOLUTION | ORAL | Status: DC | PRN

## 2012-10-14 MED ORDER — FERROUS SULFATE 325 (65 FE) MG PO TABS
325.0000 mg | ORAL_TABLET | Freq: Three times a day (TID) | ORAL | Status: DC
  Administered 2012-10-14 – 2012-10-16 (×5): 325 mg via ORAL
  Filled 2012-10-14 (×8): qty 1

## 2012-10-14 MED ORDER — TRANEXAMIC ACID 100 MG/ML IV SOLN
1000.0000 mg | INTRAVENOUS | Status: AC
  Administered 2012-10-14: 1000 mg via INTRAVENOUS
  Filled 2012-10-14: qty 10

## 2012-10-14 MED ORDER — HYDROMORPHONE HCL PF 1 MG/ML IJ SOLN
1.0000 mg | INTRAMUSCULAR | Status: DC | PRN
  Administered 2012-10-14 – 2012-10-15 (×4): 1 mg via INTRAVENOUS
  Filled 2012-10-14 (×5): qty 1

## 2012-10-14 MED ORDER — GABAPENTIN 300 MG PO CAPS
300.0000 mg | ORAL_CAPSULE | Freq: Three times a day (TID) | ORAL | Status: DC | PRN
  Administered 2012-10-14 – 2012-10-16 (×2): 300 mg via ORAL
  Filled 2012-10-14 (×2): qty 1

## 2012-10-14 MED ORDER — PROPOFOL 10 MG/ML IV BOLUS
INTRAVENOUS | Status: DC | PRN
  Administered 2012-10-14 (×7): 20 mg via INTRAVENOUS

## 2012-10-14 MED ORDER — METHOCARBAMOL 100 MG/ML IJ SOLN
500.0000 mg | Freq: Four times a day (QID) | INTRAVENOUS | Status: DC | PRN
  Administered 2012-10-14: 500 mg via INTRAVENOUS
  Filled 2012-10-14: qty 5

## 2012-10-14 MED ORDER — CLONIDINE HCL 0.1 MG PO TABS
0.1000 mg | ORAL_TABLET | Freq: Two times a day (BID) | ORAL | Status: DC
  Administered 2012-10-14 – 2012-10-16 (×4): 0.1 mg via ORAL
  Filled 2012-10-14 (×5): qty 1

## 2012-10-14 MED ORDER — SODIUM CHLORIDE 0.9 % IV SOLN
INTRAVENOUS | Status: DC
  Administered 2012-10-14: 17:00:00 via INTRAVENOUS
  Administered 2012-10-15: 1000 mL via INTRAVENOUS

## 2012-10-14 MED ORDER — CEFAZOLIN SODIUM 1-5 GM-% IV SOLN
1.0000 g | Freq: Four times a day (QID) | INTRAVENOUS | Status: AC
  Administered 2012-10-14 – 2012-10-15 (×2): 1 g via INTRAVENOUS
  Filled 2012-10-14 (×2): qty 50

## 2012-10-14 MED ORDER — PROPOFOL INFUSION 10 MG/ML OPTIME
INTRAVENOUS | Status: DC | PRN
  Administered 2012-10-14: 75 ug/kg/min via INTRAVENOUS

## 2012-10-14 MED ORDER — ONDANSETRON HCL 4 MG/2ML IJ SOLN
4.0000 mg | Freq: Four times a day (QID) | INTRAMUSCULAR | Status: DC | PRN
  Administered 2012-10-15: 4 mg via INTRAVENOUS
  Filled 2012-10-14: qty 2

## 2012-10-14 MED ORDER — ASPIRIN EC 325 MG PO TBEC
325.0000 mg | DELAYED_RELEASE_TABLET | Freq: Two times a day (BID) | ORAL | Status: DC
  Administered 2012-10-14 – 2012-10-16 (×4): 325 mg via ORAL
  Filled 2012-10-14 (×6): qty 1

## 2012-10-14 MED ORDER — METOCLOPRAMIDE HCL 10 MG PO TABS: 5.0000 mg | ORAL_TABLET | Freq: Three times a day (TID) | ORAL | Status: DC | PRN

## 2012-10-14 MED ORDER — ALUM & MAG HYDROXIDE-SIMETH 200-200-20 MG/5ML PO SUSP: 30.0000 mL | ORAL | Status: DC | PRN

## 2012-10-14 MED ORDER — ONDANSETRON HCL 4 MG PO TABS: 4.0000 mg | ORAL_TABLET | Freq: Four times a day (QID) | ORAL | Status: DC | PRN

## 2012-10-14 MED ORDER — METOCLOPRAMIDE HCL 5 MG/ML IJ SOLN
5.0000 mg | Freq: Three times a day (TID) | INTRAMUSCULAR | Status: DC | PRN
  Administered 2012-10-15: 10 mg via INTRAVENOUS
  Filled 2012-10-14: qty 2

## 2012-10-14 MED ORDER — PHENYLEPHRINE HCL 10 MG/ML IJ SOLN
10.0000 mg | INTRAVENOUS | Status: DC | PRN
  Administered 2012-10-14: 40 ug/min via INTRAVENOUS

## 2012-10-14 MED ORDER — CEFAZOLIN SODIUM-DEXTROSE 2-3 GM-% IV SOLR
2.0000 g | INTRAVENOUS | Status: AC
  Administered 2012-10-14: 2 g via INTRAVENOUS

## 2012-10-14 SURGICAL SUPPLY — 40 items
ACETAB CUP W/GRIPTION 54 (Plate) ×2 IMPLANT
BAG ZIPLOCK 12X15 (MISCELLANEOUS) IMPLANT
BLADE SAW SGTL 18X1.27X75 (BLADE) ×2 IMPLANT
CELLS DAT CNTRL 66122 CELL SVR (MISCELLANEOUS) ×1 IMPLANT
CLOTH BEACON ORANGE TIMEOUT ST (SAFETY) ×2 IMPLANT
CUP ACETAB W/GRIPTION 54 (Plate) ×1 IMPLANT
DERMABOND ADVANCED (GAUZE/BANDAGES/DRESSINGS) ×1
DERMABOND ADVANCED .7 DNX12 (GAUZE/BANDAGES/DRESSINGS) ×1 IMPLANT
DRAPE C-ARM 42X72 X-RAY (DRAPES) ×2 IMPLANT
DRAPE STERI IOBAN 125X83 (DRAPES) ×2 IMPLANT
DRAPE U-SHAPE 47X51 STRL (DRAPES) ×6 IMPLANT
DRSG AQUACEL AG ADV 3.5X10 (GAUZE/BANDAGES/DRESSINGS) ×2 IMPLANT
DURAPREP 26ML APPLICATOR (WOUND CARE) ×2 IMPLANT
ELECT BLADE TIP CTD 4 INCH (ELECTRODE) ×2 IMPLANT
ELECT REM PT RETURN 9FT ADLT (ELECTROSURGICAL) ×2
ELECTRODE REM PT RTRN 9FT ADLT (ELECTROSURGICAL) ×1 IMPLANT
ELIMINATOR HOLE APEX DEPUY (Hips) IMPLANT
FACESHIELD LNG OPTICON STERILE (SAFETY) ×6 IMPLANT
GLOVE BIO SURGEON STRL SZ7.5 (GLOVE) ×2 IMPLANT
GLOVE BIOGEL PI IND STRL 8 (GLOVE) ×2 IMPLANT
GLOVE BIOGEL PI INDICATOR 8 (GLOVE) ×2
GLOVE ECLIPSE 8.0 STRL XLNG CF (GLOVE) ×2 IMPLANT
GOWN STRL REIN XL XLG (GOWN DISPOSABLE) ×6 IMPLANT
HANDPIECE INTERPULSE COAX TIP (DISPOSABLE) ×1
HOOD PEEL AWAY FACE SHEILD DIS (HOOD) ×2 IMPLANT
KIT BASIN OR (CUSTOM PROCEDURE TRAY) ×2 IMPLANT
PACK TOTAL JOINT (CUSTOM PROCEDURE TRAY) ×2 IMPLANT
PADDING CAST COTTON 6X4 STRL (CAST SUPPLIES) ×2 IMPLANT
RTRCTR WOUND ALEXIS 18CM MED (MISCELLANEOUS) ×2
SET HNDPC FAN SPRY TIP SCT (DISPOSABLE) ×1 IMPLANT
SUT ETHIBOND NAB CT1 #1 30IN (SUTURE) ×2 IMPLANT
SUT ETHILON 3 0 PS 1 (SUTURE) IMPLANT
SUT MNCRL AB 4-0 PS2 18 (SUTURE) ×2 IMPLANT
SUT VIC AB 0 CT1 36 (SUTURE) ×2 IMPLANT
SUT VIC AB 1 CT1 36 (SUTURE) ×2 IMPLANT
SUT VIC AB 2-0 CT1 27 (SUTURE) ×1
SUT VIC AB 2-0 CT1 TAPERPNT 27 (SUTURE) ×1 IMPLANT
TOWEL OR 17X26 10 PK STRL BLUE (TOWEL DISPOSABLE) ×2 IMPLANT
TOWEL OR NON WOVEN STRL DISP B (DISPOSABLE) ×2 IMPLANT
TRAY FOLEY CATH 14FRSI W/METER (CATHETERS) ×2 IMPLANT

## 2012-10-14 NOTE — Anesthesia Postprocedure Evaluation (Signed)
  Anesthesia Post-op Note  Patient: [name]  Procedure(s) Performed: Procedure(s) (LRB): LEFT TOTAL HIP ARTHROPLASTY ANTERIOR APPROACH (Left)  Patient Location: PACU  Anesthesia Type: Spinal  Level of Consciousness: awake and alert   Airway and Oxygen Therapy: Patient Spontanous Breathing  Post-op Pain: mild  Post-op Assessment: Post-op Vital signs reviewed, Patient's Cardiovascular Status Stable, Respiratory Function Stable, Patent Airway and No signs of Nausea or vomiting  Last Vitals:  Filed Vitals:   10/14/12 1659  BP: 150/80  Pulse: 60  Temp: 36.3 C  Resp: 20    Post-op Vital Signs: stable   Complications: No apparent anesthesia complications

## 2012-10-14 NOTE — Anesthesia Procedure Notes (Signed)
Spinal  Patient location during procedure: OR Start time: 10/14/2012 12:30 PM End time: 10/14/2012 12:35 PM Staffing CRNA/Resident: Paris Lore Performed by: resident/CRNA  Preanesthetic Checklist Completed: patient identified, site marked, surgical consent, pre-op evaluation, timeout performed, IV checked, risks and benefits discussed and monitors and equipment checked Spinal Block Patient position: sitting Prep: Betadine Patient monitoring: heart rate, continuous pulse ox and blood pressure Approach: right paramedian Location: L2-3 Injection technique: single-shot Needle Needle type: Spinocan  Needle gauge: 22 G Needle length: 9 cm Needle insertion depth: 8 cm Assessment Sensory level: T4 Additional Notes Expiration date of kit checked and confirmed. Patient tolerated procedure well, without complications. L2-L3 X 1 attempt with noted clear CSF return and easy aspiration and administration of medication.

## 2012-10-14 NOTE — Anesthesia Preprocedure Evaluation (Addendum)
Anesthesia Evaluation  Patient identified by MRN, date of birth, ID band Patient awake    Reviewed: Allergy & Precautions, H&P , NPO status , Patient's Chart, lab work & pertinent test results  Airway Mallampati: II TM Distance: >3 FB Neck ROM: Full    Dental no notable dental hx. (+) Edentulous Upper and Partial Lower   Pulmonary neg pulmonary ROS,  breath sounds clear to auscultation  Pulmonary exam normal       Cardiovascular hypertension, Pt. on medications - dysrhythmias Rhythm:Regular Rate:Normal     Neuro/Psych negative neurological ROS  negative psych ROS   GI/Hepatic negative GI ROS, Neg liver ROS,   Endo/Other  negative endocrine ROS  Renal/GU negative Renal ROS  negative genitourinary   Musculoskeletal negative musculoskeletal ROS (+)   Abdominal   Peds negative pediatric ROS (+)  Hematology negative hematology ROS (+)   Anesthesia Other Findings   Reproductive/Obstetrics negative OB ROS                          Anesthesia Physical Anesthesia Plan  ASA: II  Anesthesia Plan: Spinal   Post-op Pain Management:    Induction:   Airway Management Planned: Simple Face Mask  Additional Equipment:   Intra-op Plan:   Post-operative Plan:   Informed Consent: I have reviewed the patients History and Physical, chart, labs and discussed the procedure including the risks, benefits and alternatives for the proposed anesthesia with the patient or authorized representative who has indicated his/her understanding and acceptance.   Dental advisory given  Plan Discussed with: CRNA  Anesthesia Plan Comments: (Isobaric bupiv spinal)        Anesthesia Quick Evaluation

## 2012-10-14 NOTE — H&P (Signed)
TOTAL HIP ADMISSION H&P  Patient is admitted for left total hip arthroplasty.  Subjective:  Chief Complaint: left hip pain  HPI: [name], 79 y.o. male, has a history of pain and functional disability in the left hip(s) due to arthritis and patient has failed non-surgical conservative treatments for greater than 12 weeks to include NSAID's and/or analgesics, corticosteriod injections, use of assistive devices and activity modification.  Onset of symptoms was gradual starting 7 years ago with gradually worsening course since that time.The patient noted no past surgery on the left hip(s).  Patient currently rates pain in the left hip at 10 out of 10 with activity. Patient has night pain, worsening of pain with activity and weight bearing, trendelenberg gait, pain that interfers with activities of daily living and pain with passive range of motion. Patient has evidence of subchondral cysts, subchondral sclerosis, periarticular osteophytes and joint space narrowing by imaging studies. This condition presents safety issues increasing the risk of falls.  There is no current active infection.  Patient Active Problem List   Diagnosis Date Noted  . Degenerative arthritis of hip 10/14/2012  . Postoperative anemia due to acute blood loss 06/04/2011  . Asthma exacerbation, allergic 06/04/2011  . Hyponatremia 06/04/2011  . Tachycardia 04/07/2011  . Hypertension 04/07/2011  . BPH (benign prostatic hyperplasia) 04/07/2011  . DJD (degenerative joint disease) 04/07/2011   Past Medical History  Diagnosis Date  . Hypertension   . Heart palpitations 2012  . Arthritis, lumbar spine   . BPH (benign prostatic hyperplasia)   . Edema of both legs     improved with use of meds/ compression socks used.  Marland Kitchen Dysrhythmia     1st degree heart block  palpitations 04/07/2011, seen & complete eval. /w Dr. Mayford Knife- stress, echo, holter monitor, has been cleared for surg.   . Arthritis     degenerative arthritis    . Anxiety     "pt. denies" 10-11-12    Past Surgical History  Procedure Laterality Date  . Total knee arthroplasty  2007    right total knee  . Total hip arthroplasty  2007    right total hip replacement  . Total shoulder arthroplasty Left 2007  . Back surgery  01/2011    02/2011  . Joint replacement      R knee replacement-  10/06, L shoulder - 3/07, R hip- replacement- '07  . Total knee arthroplasty  06/01/2011    Procedure: TOTAL KNEE ARTHROPLASTY;  Surgeon: Nilda Simmer, MD;  Location: [facility] OR;  Service: Orthopedics;  Laterality: Left;    No prescriptions prior to admission   Allergies  Allergen Reactions  . Hydrocodone Nausea And Vomiting    History  Substance Use Topics  . Smoking status: Former Smoker    Quit date: 03/18/1984  . Smokeless tobacco: Not on file  . Alcohol Use: 0.6 oz/week    1 Glasses of wine per week     Comment: glass of wine- holidays, or less     Family History  Problem Relation Age of Onset  . Heart attack Mother   . Hypertension Mother   . Suicidality Father   . Heart disease Sister   . Anesthesia problems Neg Hx   . Hypotension Neg Hx   . Malignant hyperthermia Neg Hx   . Pseudochol deficiency Neg Hx      Review of Systems  Musculoskeletal: Positive for joint pain.  All other systems reviewed and are negative.    Objective:  Physical Exam  Constitutional: He is oriented to person, place, and time. He appears well-developed and well-nourished.  HENT:  Head: Normocephalic and atraumatic.  Eyes: EOM are normal. Pupils are equal, round, and reactive to light.  Neck: Normal range of motion. Neck supple.  Cardiovascular: Normal rate and regular rhythm.   Respiratory: Effort normal and breath sounds normal.  GI: Soft. Bowel sounds are normal.  Musculoskeletal:       Left hip: He exhibits decreased range of motion, decreased strength, bony tenderness and crepitus.  Neurological: He is alert and oriented to person, place, and time.   Skin: Skin is warm and dry.  Psychiatric: He has a normal mood and affect.    Vital signs in last 24 hours:    Labs:   Estimated body mass index is 37.96 kg/(m^2) as calculated from the following:   Height as of 05/27/11: 5\' 10"  (1.778 m).   Weight as of 05/27/11: 120 kg (264 lb 8.8 oz).   Imaging Review Plain radiographs demonstrate severe degenerative joint disease of the left hip(s). The bone quality appears to be good for age and reported activity level.  Assessment/Plan:  End stage arthritis, left hip(s)  The patient history, physical examination, clinical judgement of the provider and imaging studies are consistent with end stage degenerative joint disease of the left hip(s) and total hip arthroplasty is deemed medically necessary. The treatment options including medical management, injection therapy, arthroscopy and arthroplasty were discussed at length. The risks and benefits of total hip arthroplasty were presented and reviewed. The risks due to aseptic loosening, infection, stiffness, dislocation/subluxation,  thromboembolic complications and other imponderables were discussed.  The patient acknowledged the explanation, agreed to proceed with the plan and consent was signed. Patient is being admitted for inpatient treatment for surgery, pain control, PT, OT, prophylactic antibiotics, VTE prophylaxis, progressive ambulation and ADL's and discharge planning.The patient is planning to be discharged home with home health services

## 2012-10-14 NOTE — Progress Notes (Signed)
Utilization review completed.  

## 2012-10-14 NOTE — Brief Op Note (Signed)
10/14/2012  2:41 PM  PATIENT:  [name]  79 y.o. male  PRE-OPERATIVE DIAGNOSIS:  Severe osteoarthritis left hip  POST-OPERATIVE DIAGNOSIS:  Severe osteoarthritis left hip  PROCEDURE:  Procedure(s): LEFT TOTAL HIP ARTHROPLASTY ANTERIOR APPROACH (Left)  SURGEON:  Surgeon(s) and Role:    * Kathryne Hitch, MD - Primary  PHYSICIAN ASSISTANT: Rexene Edison, PA-C   ANESTHESIA:   spinal  EBL:  Total I/O In: 2000 [I.V.:2000] Out: 1200 [Urine:700; Blood:500]  BLOOD ADMINISTERED:none  DRAINS: none   LOCAL MEDICATIONS USED:  NONE  SPECIMEN:  No Specimen  DISPOSITION OF SPECIMEN:  N/A  COUNTS:  YES  TOURNIQUET:  * No tourniquets in log *  DICTATION: .Other Dictation: Dictation Number [id number]  PLAN OF CARE: Admit to inpatient   PATIENT DISPOSITION:  PACU - hemodynamically stable.   Delay start of Pharmacological VTE agent (>24hrs) due to surgical blood loss or risk of bleeding: no

## 2012-10-14 NOTE — Transfer of Care (Signed)
Immediate Anesthesia Transfer of Care Note  Patient: [name]  Procedure(s) Performed: Procedure(s): LEFT TOTAL HIP ARTHROPLASTY ANTERIOR APPROACH (Left)  Patient Location: PACU  Anesthesia Type:Spinal  Level of Consciousness: sedated and patient cooperative  Airway & Oxygen Therapy: Patient Spontanous Breathing and Patient connected to face mask oxygen  Post-op Assessment: Report given to PACU RN and Post -op Vital signs reviewed and stable  Post vital signs: stable  Complications: No apparent anesthesia complications

## 2012-10-15 LAB — CBC
HCT: 34.7 % — ABNORMAL LOW (ref 39.0–52.0)
Hemoglobin: 11.6 g/dL — ABNORMAL LOW (ref 13.0–17.0)
MCH: 32 pg (ref 26.0–34.0)
MCV: 95.9 fL (ref 78.0–100.0)
RBC: 3.62 MIL/uL — ABNORMAL LOW (ref 4.22–5.81)

## 2012-10-15 LAB — BASIC METABOLIC PANEL
CO2: 30 mEq/L (ref 19–32)
Calcium: 8.7 mg/dL (ref 8.4–10.5)
Creatinine, Ser: 0.84 mg/dL (ref 0.50–1.35)
Glucose, Bld: 135 mg/dL — ABNORMAL HIGH (ref 70–99)

## 2012-10-15 NOTE — Op Note (Signed)
NAME:  Roy, [name] NO.:  [id number]  MEDICAL RECORD NO.:  [id number]  LOCATION:  1614                         FACILITY:  [facility]  PHYSICIAN:  Vanita Panda. Magnus Ivan, M.D.DATE OF BIRTH:  07-14-1932  DATE OF PROCEDURE:  10/14/2012 DATE OF DISCHARGE:                              OPERATIVE REPORT   PREOPERATIVE DIAGNOSIS:  Severe end-stage arthritis and degenerative joint disease, left hip.  POSTOPERATIVE DIAGNOSIS:  Severe end-stage arthritis and degenerative joint disease, left hip.  PROCEDURE:  Left total hip arthroplasty through direct anterior approach.  IMPLANTS:  DePuy Sector Gription acetabular component size 58, apex hole eliminator guide and 2 screws, size 36+ 4 neutral polyethylene liner, size 12 Corail femoral component with varus offset (KLA) size 36+ 5 metal hip ball.  SURGEON:  Vanita Panda. Magnus Ivan, M.D.  ASSISTANT:  Richardean Canal, [facility] was assistant was needed throughout the case to optimize the case to make this moved smoothly.  ANESTHESIA:  Spinal.  BLOOD LOSS:  500 mL.  ANTIBIOTICS:  2 g IV Ancef.  COMPLICATIONS:  None.  INDICATIONS:  [name] is a 79 year old gentleman with severe end- stage arthritis of his left hip appearance of a normal for many years. He has had a total hip arthroplasty on the right hip, bilateral total knee arthroplasties and shoulder arthroplasty.  His left leg is shorter than his right leg.  He has x-rays that show the hip started subluxing and essentially no joint space left paratracheal osteophytes of subchondral sclerosis.  Daily pain has been experiencing, ambulating with assistive device, and failure of conservative care therapy and treatment, he proceed with a total hip arthroplasty.  He understands that he is at risk of acute blood loss anemia, fracture, nerve and vessel injury, infection, and DVT.  The goals of surgery are decreased pain, increased mobility, and improved quality of  life.  DESCRIPTION OF PROCEDURE:  After informed consent was obtained, appropriate left hip was marked.  He was brought to the operating room. The side up on a stretcher, so spinal anesthesia can be obtained.  Then, placed in supine position.  Foley catheter was placed and then both feet had traction boots applied to them.  He was then placed supine on the Hana fracture table with the perineal post in place and both legs in inline skeletal traction devices but no traction applied.  His left operative hip was then prepped and draped with DuraPrep and sterile drapes.  Time-out was called to identify correct patient, correct left hip.  We then made an incision inferior and posterior to the anterior- superior iliac spine and carried this down to the tensor fascia muscle, tensor fascia lata.  The tensor fascia was then divided longitudinally. I proceeded with a direct anterior approach to the hip.  A Cobra retractor was placed around the lateral neck and up underneath the rectus femoris, a medial retractor was placed.  I cauterized the lateral femoral circumflex vessels.  I then opened up the hip capsule finding a large effusion.  I then placed the Cobra retractors within the hip capsule.  I made my femoral neck cut just proximal to lesser trochanter with an oscillating saw and completed this on osteotome.  I then placed a corkscrew guide in the femoral head.  I removed a large femoral head devoid of cartilage.  I then cleaned the acetabular debris.  I released the transverse acetabular ligament, removed remnants of acetabular labrum.  I then began reaming in 2 mm increments from size 42 up to a size 54 with a size 54, placed under direct visualization.  With all reamers placed under direct visualization, I last reamer placed under direct fluoroscopy.  I then attempted to place the real DePuy Sector Gription acetabular component size 54, and it was a loose at that point looking under  fluoroscopic but I felt that I had medial last enough but I needed to get more around fit, so we decided to go up to size.  I took that acetabular component out and reamed size 56 and a size 58.  We placed the real DePuy Sector Gription acetabular component size 58. Once I felt, it was a solid.  I placed a hole eliminator guide and I still placed 2 screws measuring 20 mm in length.  I then placed a real 36+ 4 neutral polyethylene liner, size for a 58 cup.  We then externally rotated femur to 90 degrees, extended and adducted.  I placed a Mueller retractor medially and Hohmann retractor behind the greater trochanter and found the greater trochanter.  I released the lateral hip capsule. I then used a box cutting guide and a rongeur to enter the femoral canal in the medial and lateral aspect.  I then began broaching from a size 8 broach all the way up to a size 12.  A size 12 was felt to be stable, so looking at hip anatomy, I placed a KLA or varus offset neck, and we trialed a 36+ 5 hip ball.  We brought the leg back over and up and with traction and rotate his hips and pelvis and it was stable on rotation. His leg lengths were near equal was very tight.  It started off significantly short, we were able to gain length so much, which he understood preoperatively.  We then dislocated the hip and removed all trial components.  I placed the real size 12 Corail femoral component with varus offset and the real 36+ 5 metal hip ball.  We reduced this back in the acetabulum, it was stable.  We copiously irrigated the soft tissues with normal saline solution using pulsatile lavage.  We closed the joint capsule with #1 Ethibond suture followed by running #1 Vicryl in the tensor fascia, 0 Vicryl deep tissue, 2-0 Vicryl in subcutaneous tissue, 4-0 Monocryl subcuticular stitch.  Dermabond on the skin, and then Aquacel dressing.  He was then taken off the Hana table in the recovery room in stable  condition.  All final counts correct.  There were no complications noted.  Of note, Richardean Canal, P.A.C. was present in the entire case and his presence was crucial for assisting retracting, patient positioning, implant positioning, and closure of the wound.     Vanita Panda. Magnus Ivan, M.D.     CYB/MEDQ  D:  10/14/2012  T:  10/15/2012  Job:  [id number]

## 2012-10-15 NOTE — Evaluation (Signed)
Physical Therapy Evaluation Patient Details Name: [name] MRN: [id number] DOB: [birth year] Today's Date: 10/15/2012 Time: 4098-1191 PT Time Calculation (min): 25 min  PT Assessment / Plan / Recommendation Clinical Impression  79 yo male admitted 10/14/12 for direct anterior THA. Pt experiencing burning of thigh. Pt plans to DC to home will. Will have caregivers. He needs to check on His DME. Pt will benefit from PT while in acuted care to improve functional independence .    PT Assessment  Patient needs continued PT services    Follow Up Recommendations  Home health PT    Does the patient have the potential to tolerate intense rehabilitation      Barriers to Discharge        Equipment Recommendations   (pt to check on it)    Recommendations for Other Services     Frequency 7X/week    Precautions / Restrictions Precautions Precautions: None Restrictions Weight Bearing Restrictions: No   Pertinent Vitals/Pain 5 L thigh/hip, RN notified.      Mobility  Bed Mobility Bed Mobility: Supine to Sit;Sit to Supine;Sitting - Scoot to Edge of Bed Supine to Sit: 3: Mod assist;HOB elevated;With rails Sitting - Scoot to Edge of Bed: 4: Min assist Transfers Transfers: Sit to Stand;Stand to Sit Sit to Stand: 3: Mod assist;From bed;With upper extremity assist;From elevated surface Stand to Sit: With upper extremity assist;To chair/3-in-1;4: Min assist Details for Transfer Assistance: cues for hand placement   for pushing up/reaching back. Support of LLE for sitting down and scooting into recliner. Ambulation/Gait Ambulation/Gait Assistance: 1: +2 Total assist Ambulation/Gait: Patient Percentage: 80% Ambulation Distance (Feet): 20 Feet Assistive device: Rolling walker Gait Pattern: Step-to pattern;Decreased step length - left;Decreased stance time - left    Exercises     PT Diagnosis: Acute pain  PT Problem List: Decreased strength;Decreased range of motion;Decreased  activity tolerance;Decreased mobility;Decreased knowledge of precautions;Decreased safety awareness;Decreased knowledge of use of DME;Pain PT Treatment Interventions: DME instruction;Gait training;Stair training;Functional mobility training;Therapeutic exercise;Patient/family education   PT Goals Acute Rehab PT Goals PT Goal Formulation: With patient Time For Goal Achievement: 10/22/12 Potential to Achieve Goals: Good Pt will go Supine/Side to Sit: with modified independence PT Goal: Supine/Side to Sit - Progress: Goal set today Pt will go Sit to Supine/Side: with modified independence PT Goal: Sit to Supine/Side - Progress: Goal set today Pt will go Sit to Stand: with modified independence PT Goal: Sit to Stand - Progress: Goal set today Pt will go Stand to Sit: with modified independence PT Goal: Stand to Sit - Progress: Goal set today Pt will Ambulate: >150 feet;with modified independence;with rolling walker PT Goal: Ambulate - Progress: Goal set today Pt will Go Up / Down Stairs: 1-2 stairs;with min assist;with rolling walker PT Goal: Up/Down Stairs - Progress: Goal set today Pt will Perform Home Exercise Program: Independently PT Goal: Perform Home Exercise Program - Progress: Goal set today  Visit Information  Last PT Received On: 10/15/12 Assistance Needed: +2    Subjective Data  Subjective: It burns on my thigh. Patient Stated Goal: To walk.   Prior Functioning  Home Living Lives With: Alone Available Help at Discharge: Available PRN/intermittently;Friend(s) Home Access: Stairs to enter Entrance Stairs-Number of Steps: 2 Entrance Stairs-Rails: None Home Layout: One level Bathroom Shower/Tub: Walk-in shower;Door [employer] Toilet: Handicapped height Bathroom Accessibility: Yes Home Adaptive Equipment: Hand-held shower hose;Grab bars in shower (maybe has a RW) Prior Function Level of Independence: Independent Able to Take Stairs?: Yes Driving: Yes Vocation:  Retired Comments: Professor Musician: No difficulties Dominant Hand: Right    Cognition  Cognition Arousal/Alertness: Awake/alert Behavior During Therapy: WFL for tasks assessed/performed Overall Cognitive Status: Within Functional Limits for tasks assessed    Extremity/Trunk Assessment Right Upper Extremity Assessment RUE ROM/Strength/Tone: Within functional levels Left Upper Extremity Assessment LUE ROM/Strength/Tone: Within functional levels Right Lower Extremity Assessment RLE ROM/Strength/Tone: WFL for tasks assessed RLE Sensation: WFL - Light Touch Left Lower Extremity Assessment LLE ROM/Strength/Tone: Deficits LLE ROM/Strength/Tone Deficits: tolerated hip flexion to near 80 in sitting. able to advance  LLE first during gait. LLE Sensation: WFL - Light Touch   Balance    End of Session PT - End of Session Equipment Utilized During Treatment: Gait belt Activity Tolerance: Patient limited by pain Patient left: in chair;with call bell/phone within reach Nurse Communication: Mobility status;Patient requests pain meds  GP     [name] 10/15/2012, 8:45 AM  Blanchard Kelch PT [phone]

## 2012-10-15 NOTE — Care Management Note (Signed)
CARE MANAGEMENT NOTE 10/15/2012  Patient:  [name], [name]   Account Number:  [id number]  Date Initiated:  10/15/2012  Documentation initiated by:  Sharaine Delange  Subjective/Objective Assessment:   79 yo male admitted with left total hip arthroplasty.     Action/Plan:   Home when stable   Anticipated DC Date:  10/17/2012   Anticipated DC Plan:  HOME W HOME HEALTH SERVICES      DC Planning Services  CM consult      Choice offered to / List presented to:  C-1 Patient   DME arranged  NA      DME agency  NA     HH arranged  HH-2 PT      [facility] agency  [facility]   Status of service:  Completed, signed off Medicare Important Message given?   (If response is "NO", the following Medicare IM given date fields will be blank) Date Medicare IM given:   Date Additional Medicare IM given:    Discharge Disposition:    Per UR Regulation:  Reviewed for med. necessity/level of care/duration of stay  If discussed at Long Length of Stay Meetings, dates discussed:    Comments:  10/15/12 1432 [name] [name],[name],[name] [phone] Per [facility] rep Gerrit Halls pt previously set up with [facility] for [facility] services. CM informed [facility] rep of no clinical documentation noted in pt's chart documenting pt's choice of [facility] and CM's legal obligation to offer pt choice of HH agencies if not previously documented. [facility] rep spoke with pt at bedside explaining referral made by surgeon to [facility] and pt's ultimate choice to decide which [facility] agency to provide [facility] services upon discharge. Per pt choice [facility] to provide [facility] services. Cm prior choice of new pt disposition. [facility] Nor[facility] rep provided with facesheet, H/P, & MD order.   10/15/12 1126  [name],[name],[name] [phone] Cm spoke with patient at bedside with adult son present concerning discharge planning. Pt offered choice of HH agencies in [place]. Per pt choice [facility] to provide services upon discharge. [facility] notified of referral. MD  order entered. Pt states having a RW for home use. Pt's son to assist in home care. No other needs identified.

## 2012-10-15 NOTE — Care Management Note (Addendum)
Cm spoke with patient at bedside with adult son present concerning discharge planning. Pt offered choice of HH agencies in [place]. Per pt choice [facility] to provide services upon discharge. [facility] notified of referral. MD order entered. Pt states having a RW for home use. Pt's son to assist in home care. No other needs identified.   Roxy Manns Tzvi Economou,RN,BSN [phone]

## 2012-10-15 NOTE — Evaluation (Signed)
Occupational Therapy Evaluation Patient Details Name: [name] MRN: [id number] DOB: 21-Oct-1932 Today's Date: 10/15/2012 Time: 7253-6644 OT Time Calculation (min): 27 min  OT Assessment / Plan / Recommendation Clinical Impression  This 79yo male s/p left anterior hip replacement presents to acute OT with problems below. WIll benefit from acute OT without need for follow up.    OT Assessment  Patient needs continued OT Services    Follow Up Recommendations  No OT follow up    Barriers to Discharge None    Equipment Recommendations  None recommended by OT       Frequency  Min 2X/week    Precautions / Restrictions Precautions Precautions: None Restrictions Weight Bearing Restrictions: No   Pertinent Vitals/Pain 5/10 left hip--repositioned and made RN aware    ADL  Eating/Feeding: Simulated;Independent Where Assessed - Eating/Feeding: Edge of bed Grooming: Simulated;Set up Where Assessed - Grooming: Unsupported sitting Upper Body Bathing: Simulated;Set up Where Assessed - Upper Body Bathing: Unsupported sitting Lower Body Bathing: Simulated;Maximal assistance Where Assessed - Lower Body Bathing: Supported sit to stand Upper Body Dressing: Simulated;Set up Where Assessed - Upper Body Dressing: Unsupported sitting Lower Body Dressing: Simulated;+1 Total assistance Where Assessed - Lower Body Dressing: Supported sit to stand Toilet Transfer: Simulated;+2 Total assistance Toilet Transfer: Patient Percentage: 80% Toilet Transfer Method: Sit to stand (Bed (raised)> out door> sit in recliner behind him) Toileting - Architect and Hygiene: Simulated;Moderate assistance Where Assessed - Toileting Clothing Manipulation and Hygiene: Standing Equipment Used: Rolling walker;Gait belt    OT Diagnosis: Generalized weakness;Acute pain  OT Problem List: Decreased strength;Pain;Decreased knowledge of use of DME or AE OT Treatment Interventions: DME and/or AE  instruction;Self-care/ADL training;Patient/family education;Balance training   OT Goals Acute Rehab OT Goals OT Goal Formulation: With patient Time For Goal Achievement: 10/22/12 Potential to Achieve Goals: Good ADL Goals Pt Will Perform Tub/Shower Transfer: Shower transfer;with supervision;Ambulation;Grab bars (3n1) ADL Goal: Tub/Shower Transfer - Progress: Goal set today  Visit Information  Last OT Received On: 10/15/12 Assistance Needed: +2 PT/OT Co-Evaluation/Treatment: Yes    Subjective Data  Subjective: I will have someone to help me with my LB B/D until I can do it myself   Prior Functioning     Home Living Lives With: Alone Available Help at Discharge: Available PRN/intermittently;Friend(s) Home Access: Stairs to enter Entrance Stairs-Number of Steps: 2 Entrance Stairs-Rails: None Home Layout: One level Bathroom Shower/Tub: Walk-in shower;Door Bathroom Toilet: Handicapped height Bathroom Accessibility: Yes Home Adaptive Equipment: Hand-held shower hose;Grab bars in shower;Bedside commode/3-in-1 Prior Function Level of Independence: Independent Able to Take Stairs?: Yes Driving: Yes Vocation: Retired Comments: Professor Musician: No difficulties Dominant Hand: Right         Vision/Perception Vision - History Baseline Vision: No visual deficits Patient Visual Report: No change from baseline   Cognition  Cognition Arousal/Alertness: Awake/alert Behavior During Therapy: WFL for tasks assessed/performed Overall Cognitive Status: Within Functional Limits for tasks assessed    Extremity/Trunk Assessment Right Upper Extremity Assessment RUE ROM/Strength/Tone: Within functional levels Left Upper Extremity Assessment LUE ROM/Strength/Tone: Within functional levels Right Lower Extremity Assessment RLE ROM/Strength/Tone: WFL for tasks assessed RLE Sensation: WFL - Light Touch Left Lower Extremity Assessment LLE ROM/Strength/Tone:  Deficits LLE ROM/Strength/Tone Deficits: tolerated hip flexion to near 80 in sitting. able to advance  LLE first during gait. LLE Sensation: WFL - Light Touch     Mobility Bed Mobility Bed Mobility: Supine to Sit;Sit to Supine;Sitting - Scoot to Edge of Bed Supine to Sit: 3: Mod  assist;HOB elevated;With rails Sitting - Scoot to Edge of Bed: 4: Min assist Transfers Sit to Stand: 3: Mod assist;From bed;With upper extremity assist;From elevated surface Stand to Sit: With upper extremity assist;To chair/3-in-1;4: Min assist Details for Transfer Assistance: cues for hand placement   for pushing up/reaching back. Support of LLE for sitting down and scooting into recliner.           End of Session OT - End of Session Equipment Utilized During Treatment: Gait belt Activity Tolerance: Patient limited by pain Patient left: in chair;with call bell/phone within reach Nurse Communication:  (Need for pain meds)       Evette Georges [phone] 10/15/2012, 10:51 AM

## 2012-10-15 NOTE — Progress Notes (Signed)
Subjective: Pt stable pain controlled   Objective: Vital signs in last 24 hours: Temp:  [97.4 F (36.3 C)-98.7 F (37.1 C)] 98.4 F (36.9 C) (05/31 0500) Pulse Rate:  [59-84] 83 (05/31 0500) Resp:  [18-20] 18 (05/31 0800) BP: (120-153)/(75-90) 126/76 mmHg (05/31 0500) SpO2:  [98 %-100 %] 100 % (05/31 0800) Weight:  [116.121 kg (256 lb)] 116.121 kg (256 lb) (05/30 1747)  Intake/Output from previous day: 05/30 0701 - 05/31 0700 In: 3433.8 [P.O.:360; I.V.:3073.8] Out: 3025 [Urine:2525; Blood:500] Intake/Output this shift: Total I/O In: 240 [P.O.:240] Out: -   Exam:  Neurovascular intact Sensation intact distally Intact pulses distally  Labs:  Recent Labs  10/15/12 0433  HGB 11.6*    Recent Labs  10/15/12 0433  WBC 10.0  RBC 3.62*  HCT 34.7*  PLT 216    Recent Labs  10/15/12 0433  NA 134*  K 4.5  CL 97  CO2 30  BUN 17  CREATININE 0.84  GLUCOSE 135*  CALCIUM 8.7   No results found for this basename: LABPT, INR,  in the last 72 hours  Assessment/Plan: Plan to mobilize today - doing well   Ceonna Frazzini SCOTT 10/15/2012, 9:33 AM

## 2012-10-16 LAB — CBC
HCT: 31.5 % — ABNORMAL LOW (ref 39.0–52.0)
MCH: 32.2 pg (ref 26.0–34.0)
MCV: 94.9 fL (ref 78.0–100.0)
Platelets: 170 10*3/uL (ref 150–400)
RDW: 13 % (ref 11.5–15.5)

## 2012-10-16 MED ORDER — OXYCODONE HCL 5 MG PO TABS: 5.0000 mg | ORAL_TABLET | ORAL | Status: DC | PRN

## 2012-10-16 MED ORDER — METHOCARBAMOL 500 MG PO TABS: 500.0000 mg | ORAL_TABLET | Freq: Four times a day (QID) | ORAL | Status: DC | PRN

## 2012-10-16 MED ORDER — ASPIRIN 325 MG PO TBEC: 325.0000 mg | DELAYED_RELEASE_TABLET | Freq: Two times a day (BID) | ORAL | Status: DC

## 2012-10-16 NOTE — Care Management Note (Signed)
    Page 1 of 2   10/16/2012     2:41:27 PM   CARE MANAGEMENT NOTE 10/16/2012  Patient:  [name], [name]   Account Number:  [id number]  Date Initiated:  10/15/2012  Documentation initiated by:  DAVIS,TYMEEKA  Subjective/Objective Assessment:   79 yo male admitted with left total hip arthroplasty.     Action/Plan:   Home when stable   Anticipated DC Date:  10/16/2012   Anticipated DC Plan:  HOME W HOME HEALTH SERVICES      DC Planning Services  CM consult      Choice offered to / List presented to:  C-1 Patient   DME arranged  NA      DME agency  NA     HH arranged  HH-2 PT      [facility] agency  [facility]   Status of service:  Completed, signed off Medicare Important Message given?   (If response is "NO", the following Medicare IM given date fields will be blank) Date Medicare IM given:   Date Additional Medicare IM given:    Discharge Disposition:  HOME W HOME HEALTH SERVICES  Per UR Regulation:  Reviewed for med. necessity/level of care/duration of stay  If discussed at Long Length of Stay Meetings, dates discussed:    Comments:  10/16/12 [facility] RN,BSN NCM [phone] RECEIVED CALL FROM [facility]-ALREADY FOLLOWING FOR HHPT ORDERED,& D/C.FAXED OP NOTE W/CONFIRMATION TO [phone].  10/15/12 1432 Roxy Manns Davis,RN,BSN [phone] Per [facility] rep Gerrit Halls pt previously set up with [facility] for [facility] services. CM informed [facility] rep of no clinical documentation noted in pt's chart documenting pt's choice of [facility] and CM's legal obligation to offer pt choice of HH agencies if not previously documented. [facility] rep spoke with pt at bedside explaining referral made by surgeon to [facility] and pt's ultimate choice to decide which  [facility] agency to provide [facility] services upon discharge. Per pt choice [facility] to provide [facility] services. Cm prior choice of new pt disposition. [facility] rep provided with facesheet, H/P, & MD order.   10/15/12 1126  Tymeeka  Davis,RN,BSN [phone] Cm spoke with patient at bedside with adult son present concerning discharge planning. Pt offered choice of HH agencies in [place]. Per pt choice [facility] to provide services upon discharge. [facility] notified of referral. MD order entered. Pt states having a RW for home use. Pt's son to assist in home care. No other needs identified.

## 2012-10-16 NOTE — Progress Notes (Signed)
Discharged from floor via w/c, family with pt. No changes in assessment. [name]  

## 2012-10-16 NOTE — Progress Notes (Signed)
Physical Therapy Treatment Patient Details Name: [name] MRN: [id number] DOB: 1932/11/28 Today's Date: 10/16/2012 Time: 7253-6644 PT Time Calculation (min): 9 min  PT Assessment / Plan / Recommendation Comments on Treatment Session  Pt reports soreness only. wants to DC today. Pt is ready./    Follow Up Recommendations  Home health PT     Does the patient have the potential to tolerate intense rehabilitation     Barriers to Discharge        Equipment Recommendations  None recommended by PT    Recommendations for Other Services    Frequency     Plan Discharge plan remains appropriate;Frequency remains appropriate    Precautions / Restrictions Precautions Precautions: None Restrictions Weight Bearing Restrictions: No   Pertinent Vitals/Pain     Mobility  Transfers Sit to Stand: 6: Modified independent (Device/Increase time);With upper extremity assist;With armrests;From chair/3-in-1 Stand to Sit: With armrests;6: Modified independent (Device/Increase time);To chair/3-in-1 Details for Transfer Assistance: no cues needed. Ambulation/Gait Ambulation/Gait Assistance: 5: Supervision Ambulation Distance (Feet): 100 Feet Assistive device: Rolling walker Ambulation/Gait Assistance Details: pt is improved today, pain is controlled, able to ambulate. Gait Pattern: Step-through pattern Stairs: Yes Stairs Assistance:  (verbally reviewed 1 step, pt has performed in past.) Number of Stairs: 1    Exercises Total Joint Exercises Quad Sets: AROM;Left;10 reps Short Arc Quad: AROM;Left;10 reps Heel Slides: AROM;Left;10 reps Hip ABduction/ADduction: AROM;Left;10 reps   PT Diagnosis:    PT Problem List:   PT Treatment Interventions:     PT Goals Acute Rehab PT Goals Pt will go Supine/Side to Sit: with modified independence PT Goal: Supine/Side to Sit - Progress: Progressing toward goal Pt will go Sit to Stand: with modified independence PT Goal: Sit to Stand -  Progress: Progressing toward goal Pt will go Stand to Sit: with modified independence PT Goal: Stand to Sit - Progress: Progressing toward goal Pt will Ambulate: >150 feet;with modified independence;with rolling walker Pt will Go Up / Down Stairs: 1-2 stairs;with min assist;with rolling walker (verbally reviewed,) Pt will Perform Home Exercise Program: Independently PT Goal: Perform Home Exercise Program - Progress: Progressing toward goal  Visit Information  Last PT Received On: 10/16/12 Assistance Needed: +1    Subjective Data  Subjective: It is so much better. I want to go home today.   Cognition  Cognition Arousal/Alertness: Awake/alert Behavior During Therapy: WFL for tasks assessed/performed Overall Cognitive Status: Within Functional Limits for tasks assessed    Balance     End of Session PT - End of Session Activity Tolerance: Patient tolerated treatment well Patient left: in chair;with family/visitor present Nurse Communication: Mobility status (ready for DC)   GP     [name] 10/16/2012, 12:00 PM

## 2012-10-16 NOTE — Progress Notes (Signed)
Physical Therapy Treatment Patient Details Name: [name] MRN: [id number] DOB: 1932-12-17 Today's Date: 10/16/2012 Time: 4098-1191 PT Time Calculation (min): 23 min  PT Assessment / Plan / Recommendation Comments on Treatment Session  Pt reports that pain is controlled and wants to DC to home today.  ambulated in hall.    Follow Up Recommendations  Home health PT     Does the patient have the potential to tolerate intense rehabilitation     Barriers to Discharge        Equipment Recommendations  None recommended by PT    Recommendations for Other Services    Frequency     Plan Discharge plan remains appropriate;Frequency remains appropriate    Precautions / Restrictions Precautions Precautions: None   Pertinent Vitals/Pain Soreness.    Mobility  Bed Mobility Supine to Sit: HOB elevated;5: Supervision Sitting - Scoot to Edge of Bed: 6: Modified independent (Device/Increase time) Transfers Sit to Stand: 5: Supervision;From bed;With upper extremity assist Stand to Sit: To chair/3-in-1;With armrests;With upper extremity assist Details for Transfer Assistance: cues for hand placement   for pushing up/reaching back. Support of LLE for sitting down and scooting into recliner. Ambulation/Gait Ambulation/Gait Assistance: 5: Supervision Ambulation Distance (Feet): 100 Feet Assistive device: Rolling walker Ambulation/Gait Assistance Details: pt is improved today, pain is controlled, able to ambulate. Gait Pattern: Decreased step length - left;Step-through pattern Stairs: Yes Stairs Assistance:  (verbally reviewed 1 step, pt has performed in past.) Number of Stairs: 1    Exercises Total Joint Exercises Quad Sets: AROM;Left;10 reps Short Arc Quad: AROM;Left;10 reps Heel Slides: AROM;Left;10 reps Hip ABduction/ADduction: AROM;Left;10 reps   PT Diagnosis:    PT Problem List:   PT Treatment Interventions:     PT Goals Acute Rehab PT Goals Pt will go Supine/Side to  Sit: with modified independence PT Goal: Supine/Side to Sit - Progress: Progressing toward goal Pt will go Sit to Stand: with modified independence PT Goal: Sit to Stand - Progress: Progressing toward goal Pt will go Stand to Sit: with modified independence PT Goal: Stand to Sit - Progress: Progressing toward goal Pt will Ambulate: >150 feet;with modified independence;with rolling walker Pt will Go Up / Down Stairs: 1-2 stairs;with min assist;with rolling walker (verbally reviewed,) Pt will Perform Home Exercise Program: Independently PT Goal: Perform Home Exercise Program - Progress: Progressing toward goal  Visit Information  Last PT Received On: 10/16/12 Assistance Needed: +1    Subjective Data  Subjective: It is so much better. I want to go home today.   Cognition  Cognition Arousal/Alertness: Suspect due to medications    Balance     End of Session PT - End of Session Activity Tolerance: Patient tolerated treatment well Patient left: in chair Nurse Communication: Mobility status   GP     [name] 10/16/2012, 11:03 AM Blanchard Kelch PT [phone]

## 2012-10-16 NOTE — Progress Notes (Signed)
Physical Therapy Treatment/LATE ENTRY FOR pm TREATMENT 10/15/12 Patient Details Name: [name] MRN: [id number] DOB: [birth year] Today's Date: 10/15/2012 Time: 1191-4782 PT Time Calculation (min): 27 min  PT Assessment / Plan / Recommendation Comments on Treatment Session  Pt reports that mwedication has made him very tired. Pt also c/o burning in thigh on L. Pt was very weak and unable to safely ambulate.    Follow Up Recommendations  Home health PT     Does the patient have the potential to tolerate intense rehabilitation     Barriers to Discharge        Equipment Recommendations   (pt to check on RW availability.)    Recommendations for Other Services    Frequency 7X/week   Plan Discharge plan remains appropriate;Frequency remains appropriate    Precautions / Restrictions Precautions Precautions: None   Pertinent Vitals/Pain C/O THIGH BURNING, ice applied. Had medication    Mobility  Bed Mobility Sit to Supine: 3: Mod assist Details for Bed Mobility Assistance: used leg lifter to assist LLE onto bed with assistance. Transfers Sit to Stand: 1: +2 Total assist;With armrests;From chair/3-in-1 Sit to Stand: Patient Percentage: 60% Stand to Sit: 3: Mod assist;To bed;With upper extremity assist Details for Transfer Assistance: cues for hand placement   for pushing up/reaching back. Support of LLE for sitting down and scooting into recliner. Ambulation/Gait Ambulation/Gait Assistance: 1: +2 Total assist Ambulation/Gait: Patient Percentage: 60% Ambulation Distance (Feet): 5 Feet Assistive device: Rolling walker Ambulation/Gait Assistance Details: Pt having difficulty with weightbearing on L leg, Leg tends to collapse. Pt unable to ambulate safely. returned to bed. Gait Pattern: Step-to pattern;Decreased step length - left;Decreased stance time - left    Exercises     PT Diagnosis:    PT Problem List:   PT Treatment Interventions:     PT Goals Acute Rehab PT  Goals Pt will go Sit to Supine/Side: with modified independence PT Goal: Sit to Supine/Side - Progress: Progressing toward goal Pt will go Sit to Stand: with modified independence PT Goal: Sit to Stand - Progress: Progressing toward goal Pt will go Stand to Sit: with modified independence PT Goal: Stand to Sit - Progress: Progressing toward goal Pt will Ambulate: >150 feet;with modified independence;with rolling walker PT Goal: Ambulate - Progress: Progressing toward goal  Visit Information  Last PT Received On: 10/15/12 Assistance Needed: +2    Subjective Data  Subjective: that medicine has knocked me out. I feel heavy.. My thigh is really burning   Cognition  Cognition Arousal/Alertness: Suspect due to medications    Balance     End of Session PT - End of Session Activity Tolerance: Patient limited by fatigue;Patient limited by pain Patient left: in bed;with call bell/phone within reach Nurse Communication: Mobility status   GP     [name] 10/16/2012, 7:35 AM Blanchard Kelch PT [phone]

## 2012-10-16 NOTE — Progress Notes (Signed)
Occupational Therapy Treatment Patient Details Name: [name] MRN: [id number] DOB: [birth year] Today's Date: 10/16/2012 Time: 1115-1130 OT Time Calculation (min): 15 min  OT Assessment / Plan / Recommendation Comments on Treatment Session Pt is anticipating d/c home today.  Reviewed safety, use of long handled sponge, multiple uses of 3 in1, toileting in standing and shower transfer.  Pt will have 24 hour assist at home.    Follow Up Recommendations  No OT follow up    Barriers to Discharge       Equipment Recommendations  None recommended by OT    Recommendations for Other Services    Frequency     Plan Discharge plan remains appropriate    Precautions / Restrictions Precautions Precautions: None Restrictions Weight Bearing Restrictions: No   Pertinent Vitals/Pain No c/o pain, premedicated, iced L hip    ADL  Toilet Transfer: Modified independent Toilet Transfer Method:  (standing to urinate) Toileting - Clothing Manipulation and Hygiene: Modified independent Where Assessed - Glass blower/designer Manipulation and Hygiene: Standing Tub/Shower Transfer: Supervision/safety Tub/Shower Transfer Method: Science writer: Grab bars;Walk in shower (3 in 1) Equipment Used: Rolling walker ADL Comments: Instructed pt and family in use of 3 in1 as shower seat.    OT Diagnosis:    OT Problem List:   OT Treatment Interventions:     OT Goals ADL Goals Pt Will Perform Tub/Shower Transfer: Shower transfer;with supervision;Ambulation;Grab bars ADL Goal: Tub/Shower Transfer - Progress: Met  Visit Information  Last OT Received On: 10/16/12 Assistance Needed: +1    Subjective Data      Prior Functioning       Cognition  Cognition Arousal/Alertness: Awake/alert Behavior During Therapy: WFL for tasks assessed/performed Overall Cognitive Status: Within Functional Limits for tasks assessed    Mobility  Bed Mobility Bed Mobility: Not  assessed  Transfers Sit to Stand: 5: Supervision;From bed;With upper extremity assist Stand to Sit: To chair/3-in-1;With armrests;With upper extremity assist    Exercises    Balance     End of Session OT - End of Session Activity Tolerance: Patient tolerated treatment well Patient left: in chair;with call bell/phone within reach;with family/visitor present Nurse Communication:  (pt does not want oxycodone for pain at home)  GO     Evern Bio 10/16/2012, 11:39 AM [phone]

## 2012-10-16 NOTE — Progress Notes (Signed)
Subjective: Pt states he feels better today   Objective: Vital signs in last 24 hours: Temp:  [98.6 F (37 C)-99.2 F (37.3 C)] 99.2 F (37.3 C) (06/01 0545) Pulse Rate:  [82-88] 86 (06/01 0545) Resp:  [16-18] 16 (06/01 0545) BP: (92-134)/(55-67) 134/67 mmHg (06/01 0545) SpO2:  [93 %-99 %] 99 % (06/01 0545)  Intake/Output from previous day: 05/31 0701 - 06/01 0700 In: 1350.6 [P.O.:780; I.V.:570.6] Out: 1250 [Urine:1250] Intake/Output this shift: Total I/O In: 120 [P.O.:120] Out: -   Exam:  Neurovascular intact Sensation intact distally Intact pulses distally  Labs:  Recent Labs  10/15/12 0433 10/16/12 0422  HGB 11.6* 10.7*    Recent Labs  10/15/12 0433 10/16/12 0422  WBC 10.0 10.4  RBC 3.62* 3.32*  HCT 34.7* 31.5*  PLT 216 170    Recent Labs  10/15/12 0433  NA 134*  K 4.5  CL 97  CO2 30  BUN 17  CREATININE 0.84  GLUCOSE 135*  CALCIUM 8.7   No results found for this basename: LABPT, INR,  in the last 72 hours  Assessment/Plan: Plan dc today after PT per pt request - he is mobilizing well   DEAN,GREGORY SCOTT 10/16/2012, 10:48 AM

## 2012-10-17 ENCOUNTER — Encounter (HOSPITAL_COMMUNITY): Payer: Self-pay | Admitting: Orthopaedic Surgery

## 2012-10-21 ENCOUNTER — Ambulatory Visit
Admission: RE | Admit: 2012-10-21 | Discharge: 2012-10-21 | Disposition: A | Discharge status: Home / Self Care | Payer: Medicare Other | Source: Ambulatory Visit | Attending: Orthopaedic Surgery | Admitting: Orthopaedic Surgery

## 2012-10-21 ENCOUNTER — Other Ambulatory Visit: Payer: Self-pay | Admitting: Orthopaedic Surgery

## 2012-10-21 DIAGNOSIS — R52 Pain, unspecified: Secondary | ICD-10-CM

## 2012-11-16 NOTE — Discharge Summary (Signed)
Patient ID: [name] MRN: [id number] DOB/AGE: [birth year] 77 y.o.  Admit date: 10/14/2012 Discharge date: 11/16/2012  Admission Diagnoses:  Principal Problem:   Degenerative arthritis of hip   Discharge Diagnoses:  Same  Past Medical History  Diagnosis Date  . Hypertension   . Heart palpitations 2012  . Arthritis, lumbar spine   . BPH (benign prostatic hyperplasia)   . Edema of both legs     improved with use of meds/ compression socks used.  Marland Kitchen Dysrhythmia     1st degree heart block  palpitations 04/07/2011, seen & complete eval. /w Dr. Mayford Knife- stress, echo, holter monitor, has been cleared for surg.   . Arthritis     degenerative arthritis   . Anxiety     "pt. denies" 10-11-12    Surgeries: Procedure(s): LEFT TOTAL HIP ARTHROPLASTY ANTERIOR APPROACH on 10/14/2012   Consultants:  PT  Discharged Condition: Improved  Hospital Course: [name] is an 79 y.o. male who was admitted 10/14/2012 for operative treatment ofDegenerative arthritis of hip. Patient has severe unremitting pain that affects sleep, daily activities, and work/hobbies. After pre-op clearance the patient was taken to the operating room on 10/14/2012 and underwent  Procedure(s): LEFT TOTAL HIP ARTHROPLASTY ANTERIOR APPROACH.    Patient was given perioperative antibiotics:  Anti-infectives   Start     Dose/Rate Route Frequency Ordered Stop   10/14/12 1800  ceFAZolin (ANCEF) IVPB 1 g/50 mL premix     1 g 100 mL/hr over 30 Minutes Intravenous Every 6 hours 10/14/12 1644 10/15/12 0037   10/14/12 1000  ceFAZolin (ANCEF) IVPB 2 g/50 mL premix     2 g 100 mL/hr over 30 Minutes Intravenous On call to O.R. 10/14/12 0916 10/14/12 1237       Patient was given sequential compression devices, early ambulation, and chemoprophylaxis to prevent DVT.  Patient benefited maximally from hospital stay and there were no complications.    Recent vital signs: No data found.    Recent laboratory studies: No  results found for this basename: WBC, HGB, HCT, PLT, NA, K, CL, CO2, BUN, CREATININE, GLUCOSE, PT, INR, CALCIUM, 2,  in the last 72 hours   Discharge Medications:     Medication List    STOP taking these medications       diclofenac 75 MG EC tablet  Commonly known as:  VOLTAREN      TAKE these medications       amLODipine 5 MG tablet  Commonly known as:  NORVASC  Take 5 mg by mouth every morning.     aspirin 325 MG EC tablet  Take 1 tablet (325 mg total) by mouth 2 (two) times daily after a meal.     cloNIDine 0.1 MG tablet  Commonly known as:  CATAPRES  Take 0.1 mg by mouth 2 (two) times daily.     docusate sodium 100 MG capsule  Commonly known as:  COLACE  Take 100 mg by mouth daily.     eplerenone 50 MG tablet  Commonly known as:  INSPRA  Take 50 mg by mouth every morning.     finasteride 5 MG tablet  Commonly known as:  PROSCAR  Take 5 mg by mouth every morning.     furosemide 40 MG tablet  Commonly known as:  LASIX  Take 40 mg by mouth every morning.     gabapentin 300 MG capsule  Commonly known as:  NEURONTIN  Take 300 mg by mouth 3 (three) times daily as  needed (pain).     methocarbamol 500 MG tablet  Commonly known as:  ROBAXIN  Take 1 tablet (500 mg total) by mouth every 6 (six) hours as needed.     metoprolol succinate 100 MG 24 hr tablet  Commonly known as:  TOPROL-XL  Take 150 mg by mouth every morning.     multivitamins ther. w/minerals Tabs  Take 1 tablet by mouth daily.     oxyCODONE 5 MG immediate release tablet  Commonly known as:  Oxy IR/ROXICODONE  Take 1-2 tablets (5-10 mg total) by mouth every 3 (three) hours as needed.     quinapril 40 MG tablet  Commonly known as:  ACCUPRIL  Take 40 mg by mouth every morning.        Diagnostic Studies: US Venous Img Lower Unilateral Left  10/21/2012   *RADIOLOGY REPORT*  Clinical Data: Left lower extremity swelling  LEFT LOWER EXTREMITY VENOUS DUPLEX ULTRASOUND  Technique:  Gray-scale  sonography with graded compression, as well as color Doppler and duplex ultrasound, were performed to evaluate the deep venous system of the lower extremity from the level of the common femoral vein through the popliteal and proximal calf veins. Spectral Doppler was utilized to evaluate flow at rest and with distal augmentation maneuvers.  Comparison:  None.  Findings: There is complete compressibility of the left common femoral, femoral, and popliteal veins.  Doppler analysis demonstrates respiratory phasicity and augmentation of flow upon calf compression.  IMPRESSION: No evidence of left lower extremity DVT.   Original Report Authenticated By: Jolaine Click, M.D.    Disposition: 06-Home-Health Care Svc      Discharge Orders   Future Orders Complete By Expires     Call MD / Call 911  As directed     Comments:      If you experience chest pain or shortness of breath, CALL 911 and be transported to the hospital emergency room.  If you develope a fever above 101 F, pus (white drainage) or increased drainage or redness at the wound, or calf pain, call your surgeon's office.    Constipation Prevention  As directed     Comments:      Drink plenty of fluids.  Prune juice may be helpful.  You may use a stool softener, such as Colace (over the counter) 100 mg twice a day.  Use MiraLax (over the counter) for constipation as needed.    Diet - low sodium heart healthy  As directed     Discharge instructions  As directed     Comments:      Keep incision dry and use walker    Increase activity slowly as tolerated  As directed           Signed: Richardean Canal 11/16/2012, 9:27 AM

## 2013-04-10 ENCOUNTER — Other Ambulatory Visit: Payer: Self-pay | Admitting: Dermatology

## 2013-04-18 ENCOUNTER — Telehealth: Payer: Self-pay | Admitting: Cardiology

## 2013-04-18 DIAGNOSIS — I77810 Thoracic aortic ectasia: Secondary | ICD-10-CM

## 2013-04-18 NOTE — Telephone Encounter (Signed)
New message     Pt is scheduled for an echo tomorrow in the [facility] cardiology system but no echo is scheduled in the [facility] system.   Is he supposed to still have an echo?

## 2013-04-18 NOTE — Telephone Encounter (Signed)
Returned call to patient Dr.Turner ordered yearly echo.Schedulers will call back to schedule.

## 2013-04-18 NOTE — Telephone Encounter (Signed)
Returned call to patient he stated it is time to get his yearly echo.Dr.Turner out of office will send message to her for a order.

## 2013-04-18 NOTE — Telephone Encounter (Signed)
Please order yearly echo for followup of aortic root dilatation

## 2013-05-03 ENCOUNTER — Other Ambulatory Visit: Payer: Self-pay | Admitting: Dermatology

## 2013-05-04 ENCOUNTER — Ambulatory Visit (HOSPITAL_COMMUNITY): Payer: Medicare Other | Attending: Cardiology | Admitting: Radiology

## 2013-05-04 ENCOUNTER — Encounter (INDEPENDENT_AMBULATORY_CARE_PROVIDER_SITE_OTHER): Payer: Self-pay

## 2013-05-04 ENCOUNTER — Encounter: Payer: Self-pay | Admitting: Cardiology

## 2013-05-04 DIAGNOSIS — I77810 Thoracic aortic ectasia: Secondary | ICD-10-CM | POA: Insufficient documentation

## 2013-05-04 DIAGNOSIS — I359 Nonrheumatic aortic valve disorder, unspecified: Secondary | ICD-10-CM | POA: Insufficient documentation

## 2013-05-04 DIAGNOSIS — I10 Essential (primary) hypertension: Secondary | ICD-10-CM | POA: Insufficient documentation

## 2013-05-04 DIAGNOSIS — R609 Edema, unspecified: Secondary | ICD-10-CM

## 2013-05-04 DIAGNOSIS — E669 Obesity, unspecified: Secondary | ICD-10-CM | POA: Insufficient documentation

## 2013-05-04 NOTE — Progress Notes (Signed)
Echocardiogram performed.  

## 2013-05-08 ENCOUNTER — Telehealth: Payer: Self-pay | Admitting: Cardiology

## 2013-05-08 NOTE — Telephone Encounter (Signed)
Follow Up ° °Pt returned call for results. Please call  °

## 2013-05-08 NOTE — Telephone Encounter (Signed)
Made pt aware of ECHO results.

## 2013-05-22 ENCOUNTER — Ambulatory Visit (INDEPENDENT_AMBULATORY_CARE_PROVIDER_SITE_OTHER): Payer: Medicare Other | Admitting: Podiatry

## 2013-05-22 ENCOUNTER — Encounter: Payer: Self-pay | Admitting: Podiatry

## 2013-05-22 VITALS — BP 139/76 | HR 70 | Resp 18

## 2013-05-22 DIAGNOSIS — M2040 Other hammer toe(s) (acquired), unspecified foot: Secondary | ICD-10-CM

## 2013-05-22 NOTE — Progress Notes (Signed)
   Subjective:    Patient ID: [name], male    DOB: [birth year], 80 y.o.   MRN: [id number]  HPI I want a general evalation and some calluses and I do have some lower back issues and could be affecting my feet and I have had 4 joint surgeries below the waist and burning and some tingling and there is some swelling due to medicines and my left leg is shorter than my right leg    Review of Systems  Constitutional: Negative.   HENT: Negative.   Eyes: Negative.   Respiratory: Negative.   Cardiovascular: Negative.   Gastrointestinal: Negative.   Endocrine: Negative.   Genitourinary: Negative.        BPH  Musculoskeletal: Positive for back pain.  Skin: Negative.   Allergic/Immunologic: Positive for environmental allergies.  Neurological: Negative.   Hematological: Bruises/bleeds easily.  Psychiatric/Behavioral: Negative.        Objective:   Physical Exam  Orientated x3 white male.  Vascular: The DP and PT pulses are two over four bilaterally.  Neurological: Sensation to 10 g monofilament wire intact 7/10 locations, bilaterally. Vibratory sensation diminished bilaterally. Trace reactive knee and ankle reflexes bilaterally.  Dermatological: Mild diffuse plantar callusing MPJs bilaterally.  Musculoskeletal: Right lower extremity visually appears longer than the left. HAV deformities bilaterally. Hammering of lateral digits 2 through 5 bilaterally. Patient has some tenderness plantar MPJ bilaterally with mild atrophy of fat pad MPJ noted bilaterally. He has somewhat unsteady gait and uses a cane.        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 I advised him to wear a soft athletic style running shoe. I also encouraged him  to use his cane to prevent falls. Reappoint at patient's request.

## 2013-06-14 ENCOUNTER — Other Ambulatory Visit (HOSPITAL_COMMUNITY): Payer: Medicare Other

## 2013-07-01 ENCOUNTER — Encounter: Payer: Self-pay | Admitting: Cardiology

## 2013-07-06 ENCOUNTER — Ambulatory Visit (INDEPENDENT_AMBULATORY_CARE_PROVIDER_SITE_OTHER): Payer: Medicare Other | Admitting: Cardiology

## 2013-07-06 ENCOUNTER — Encounter: Payer: Self-pay | Admitting: Cardiology

## 2013-07-06 VITALS — BP 160/100 | HR 62 | Ht 71.0 in | Wt 270.1 lb

## 2013-07-06 DIAGNOSIS — R609 Edema, unspecified: Secondary | ICD-10-CM

## 2013-07-06 DIAGNOSIS — I4949 Other premature depolarization: Secondary | ICD-10-CM

## 2013-07-06 DIAGNOSIS — R000 Tachycardia, unspecified: Secondary | ICD-10-CM

## 2013-07-06 DIAGNOSIS — R600 Localized edema: Secondary | ICD-10-CM | POA: Insufficient documentation

## 2013-07-06 DIAGNOSIS — I10 Essential (primary) hypertension: Secondary | ICD-10-CM

## 2013-07-06 DIAGNOSIS — I493 Ventricular premature depolarization: Secondary | ICD-10-CM

## 2013-07-06 NOTE — Patient Instructions (Signed)
Your physician recommends that you continue on your current medications as directed. Please refer to the Current Medication list given to you today.  Your physician wants you to follow-up in: 1 Year with Dr Turner You will receive a reminder letter in the mail two months in advance. If you don't receive a letter, please call our office to schedule the follow-up appointment.  

## 2013-07-06 NOTE — Progress Notes (Signed)
[address], [name],   [address] Phone: [phone] Fax:  [phone]  Date:  07/06/2013   ID:  [name], DOB 02/15/1933, MRN [id number]  PCP:  Mathews Argyle, MD  Cardiologist:  Fransico Him ,MD     History of Present Illness: [name] is a 80 y.o. male with a history of HTN and PVC's presents today for followup.  He is doing well.  He denies any chest pain, dizziness, palpitations or syncope.   His main complaint is that of chronic low back pain and when his back pain gets real bad he may get short winded some.  He has chronic LE edema from the amlodipine and wears TED hose stockings to control it.     Wt Readings from Last 3 Encounters:  07/06/13 270 lb 1.9 oz (122.526 kg)  10/14/12 256 lb (116.121 kg)  10/14/12 256 lb (116.121 kg)     Past Medical History  Diagnosis Date  . Hypertension   . Heart palpitations 2012  . Arthritis, lumbar spine   . BPH (benign prostatic hyperplasia)   . Edema of both legs     improved with use of meds/ compression socks used.  Marland Kitchen Dysrhythmia     1st degree heart block  palpitations 04/07/2011, seen & complete eval. /w Dr. Radford Pax- stress, echo, holter monitor, has been cleared for surg.   . Arthritis     degenerative arthritis   . Anxiety     "pt. denies" 10-11-12  . PVC's (premature ventricular contractions)     Current Outpatient Prescriptions  Medication Sig Dispense Refill  . amLODipine (NORVASC) 5 MG tablet Take 5 mg by mouth every morning.       Marland Kitchen aspirin 325 MG EC tablet Take 81 mg by mouth daily.      . cloNIDine (CATAPRES) 0.1 MG tablet Take 0.1 mg by mouth 2 (two) times daily.        Marland Kitchen docusate sodium (COLACE) 100 MG capsule Take 100 mg by mouth daily.      Marland Kitchen eplerenone (INSPRA) 50 MG tablet Take 50 mg by mouth every morning.      . finasteride (PROSCAR) 5 MG tablet Take 5 mg by mouth every morning.       . furosemide (LASIX) 40 MG tablet Take 40 mg by mouth every morning.       .  gabapentin (NEURONTIN) 300 MG capsule Take 300 mg by mouth 3 (three) times daily as needed (pain).      . metoprolol (TOPROL-XL) 100 MG 24 hr tablet Take 150 mg by mouth every morning.       . Multiple Vitamins-Minerals (MULTIVITAMINS THER. W/MINERALS) TABS Take 1 tablet by mouth daily.        . quinapril (ACCUPRIL) 40 MG tablet Take 40 mg by mouth every morning.       . traMADol (ULTRAM) 50 MG tablet        No current facility-administered medications for this visit.    Allergies:    Allergies  Allergen Reactions  . Hydrocodone Nausea And Vomiting    Social History:  The patient  reports that he quit smoking about 29 years ago. He has never used smokeless tobacco. He reports that he does not drink alcohol or use illicit drugs.   Family History:  The patient's family history includes Heart attack in his mother; Heart disease in his sister; Hypertension in his mother; Suicidality in his father. There is no history  of Anesthesia problems, Hypotension, Malignant hyperthermia, or Pseudochol deficiency.   ROS:  Please see the history of present illness.      All other systems reviewed and negative.   PHYSICAL EXAM: VS:  BP 160/100  Pulse 62  Ht 5\' 11"  (1.803 m)  Wt 270 lb 1.9 oz (122.526 kg)  BMI 37.69 kg/m2 Well nourished, well developed, in no acute distress HEENT: normal Neck: no JVD Cardiac:  normal S1, S2; RRR; no murmur Lungs:  clear to auscultation bilaterally, no wheezing, rhonchi or rales Abd: soft, nontender, no hepatomegaly Ext: trace edema Skin: warm and dry Neuro:  CNs 2-12 intact, no focal abnormalities noted  EKG:   NSR    ASSESSMENT AND PLAN:  1. PVC's asymptomatic  - continue Toprol 2. HTN elevated in the office but at home they range from 117-146/57-61mmHg  - continue amlodipine/Clonidine/Toprol/Quinapril 3.  Chronic LE edema - stable on diuretics and TED hose stockings  Followup with me in 1 year    Signed, Fransico Him, MD 07/06/2013 11:33 AM

## 2013-11-28 ENCOUNTER — Encounter: Payer: Self-pay | Admitting: Gastroenterology

## 2013-11-30 ENCOUNTER — Encounter: Payer: Self-pay | Admitting: Gastroenterology

## 2014-01-29 ENCOUNTER — Encounter: Payer: Self-pay | Admitting: Podiatry

## 2014-01-29 ENCOUNTER — Ambulatory Visit (INDEPENDENT_AMBULATORY_CARE_PROVIDER_SITE_OTHER): Payer: Medicare Other | Admitting: Podiatry

## 2014-01-29 VITALS — BP 160/94 | HR 60 | Resp 12

## 2014-01-29 DIAGNOSIS — M779 Enthesopathy, unspecified: Secondary | ICD-10-CM

## 2014-01-29 DIAGNOSIS — M2010 Hallux valgus (acquired), unspecified foot: Secondary | ICD-10-CM

## 2014-01-29 DIAGNOSIS — M2040 Other hammer toe(s) (acquired), unspecified foot: Secondary | ICD-10-CM

## 2014-01-30 ENCOUNTER — Encounter: Payer: Self-pay | Admitting: Podiatry

## 2014-01-30 NOTE — Progress Notes (Signed)
Patient ID: [name], male   DOB: [birth year], 80 y.o.   MRN: [id number] Subjective: This patient presents complaining of painful plantar MPJ is bilaterally when standing walking and becoming progressively more confluent last year. Patient has existing custom orthotics contour satisfactorily, however, the orthotics are not providing adequate relief in the MPJ area bilaterally. Also, he's been diagnosed with limb shortage left and is wearing a heel lift on the left.  Objective: Orientated x3 white male  Vascular: DP and PT pulses 2/4 bilaterally Mild pitting edema dorsal and feet bilaterally  Neurological: Ankle reflex equal and reactive bilaterally  Dermatological: Atrophic fad pad MPJ bilaterally  Musculoskeletal: Left HAV deformity Hammertoe deformity second bilaterally Palpable tenderness plantar second through fourth MPJ bilaterally  Assessment: HAV deformity left Hammertoe deformity second bilaterally Atrophic fad pad MPJ associated with metatarsalgia/capsulitis 2-4 MPJ bilaterally  Plan: Using existing custom foot orthotics Attach surgical felt heel left left Attach felt metatarsal pads bilaterally Patient advised to replace running shoes  Return as needed

## 2014-02-13 ENCOUNTER — Ambulatory Visit (INDEPENDENT_AMBULATORY_CARE_PROVIDER_SITE_OTHER): Payer: Medicare Other | Admitting: Gastroenterology

## 2014-02-13 ENCOUNTER — Encounter: Payer: Self-pay | Admitting: Gastroenterology

## 2014-02-13 VITALS — BP 138/74 | HR 72 | Ht 71.0 in | Wt 256.0 lb

## 2014-02-13 DIAGNOSIS — Z1211 Encounter for screening for malignant neoplasm of colon: Secondary | ICD-10-CM

## 2014-02-13 DIAGNOSIS — L290 Pruritus ani: Secondary | ICD-10-CM

## 2014-02-13 MED ORDER — MOVIPREP 100 G PO SOLR: 1.0000 | Freq: Once | ORAL | Status: DC

## 2014-02-13 NOTE — Progress Notes (Signed)
HPI: This is a    very pleasant 80 year old man who last had a colonoscopy June 2005 with Dr. Verl Blalock for routine screening. The examination was normal except for internal hemorrhoids and diverticulosis. He has trouble with a anal fissure several months ago that was treated with nitroglycerin. Seemed to resolve however he still does have periodic anal itching that can be very bothersome to him.  No change in his bowel habits, no overt GI bleeding. His weight has been stable.  Review of systems: Pertinent positive and negative review of systems were noted in the above HPI section. Complete review of systems was performed and was otherwise normal.    Past Medical History  Diagnosis Date  . Hypertension   . Heart palpitations 2012  . Arthritis, lumbar spine   . BPH (benign prostatic hyperplasia)   . Edema of both legs     improved with use of meds/ compression socks used.  Marland Kitchen Dysrhythmia     1st degree heart block  palpitations 04/07/2011, seen & complete eval. /w Dr. Radford Pax- stress, echo, holter monitor, has been cleared for surg.   . Arthritis     degenerative arthritis   . Anxiety     "pt. denies" 10-11-12  . PVC's (premature ventricular contractions)     Past Surgical History  Procedure Laterality Date  . Total knee arthroplasty  2007    right total knee  . Total hip arthroplasty  2007    right total hip replacement  . Total shoulder arthroplasty Left 2007  . Back surgery  01/2011    02/2011  . Joint replacement      R knee replacement-  10/06, L shoulder - 3/07, R hip- replacement- '07  . Total knee arthroplasty  06/01/2011    Procedure: TOTAL KNEE ARTHROPLASTY;  Surgeon: Lorn Junes, MD;  Location: [facility];  Service: Orthopedics;  Laterality: Left;  . Total hip arthroplasty Left 10/14/2012    Procedure: LEFT TOTAL HIP ARTHROPLASTY ANTERIOR APPROACH;  Surgeon: Mcarthur Rossetti, MD;  Location: [facility];  Service: Orthopedics;  Laterality: Left;    Current  Outpatient Prescriptions  Medication Sig Dispense Refill  . amLODipine (NORVASC) 5 MG tablet Take 5 mg by mouth every morning.       Marland Kitchen aspirin 81 MG tablet Take 81 mg by mouth daily.      . cloNIDine (CATAPRES) 0.1 MG tablet Take 0.1 mg by mouth 2 (two) times daily.        . diclofenac (VOLTAREN) 75 MG EC tablet Take 75 mg by mouth 2 (two) times daily.      Marland Kitchen docusate sodium (COLACE) 100 MG capsule Take 100 mg by mouth daily.      Marland Kitchen eplerenone (INSPRA) 50 MG tablet Take 50 mg by mouth every morning.      . finasteride (PROSCAR) 5 MG tablet Take 5 mg by mouth every morning.       . furosemide (LASIX) 40 MG tablet Take 40 mg by mouth every morning.       . gabapentin (NEURONTIN) 300 MG capsule Take 300 mg by mouth 3 (three) times daily as needed (pain).      . metoprolol (TOPROL-XL) 100 MG 24 hr tablet Take 150 mg by mouth every morning.       . Multiple Vitamins-Minerals (MULTIVITAMINS THER. W/MINERALS) TABS Take 1 tablet by mouth daily.        . Omega 3 1000 MG CAPS Take by mouth.      Marland Kitchen  quinapril (ACCUPRIL) 40 MG tablet Take 40 mg by mouth every morning.       . traMADol (ULTRAM) 50 MG tablet       . triamcinolone (KENALOG) 0.025 % cream Apply 1 application topically 2 (two) times daily.       No current facility-administered medications for this visit.    Allergies as of 02/13/2014 - Review Complete 02/13/2014  Allergen Reaction Noted  . Hydrocodone Nausea And Vomiting 04/07/2011  . Spironolactone  10/06/2013    Family History  Problem Relation Age of Onset  . Heart attack Mother   . Hypertension Mother   . Suicidality Father   . Heart disease Sister   . Anesthesia problems Neg Hx   . Hypotension Neg Hx   . Malignant hyperthermia Neg Hx   . Pseudochol deficiency Neg Hx     History   Social History  . Marital Status: Single    Spouse Name: N/A    Number of Children: N/A  . Years of Education: N/A   Occupational History  . Not on file.   Social History Main Topics   . Smoking status: Former Smoker    Quit date: 03/18/1984  . Smokeless tobacco: Never Used  . Alcohol Use: No     Comment: glass of wine- holidays, or less   . Drug Use: No  . Sexual Activity: No   Other Topics Concern  . Not on file   Social History Narrative  . No narrative on file       Physical Exam: BP 138/74  Pulse 72  Ht 5\' 11"  (1.803 m)  Wt 256 lb (116.121 kg)  BMI 35.72 kg/m2 Constitutional: generally well-appearing Psychiatric: alert and oriented x3 Eyes: extraocular movements intact Mouth: oral pharynx moist, no lesions Neck: supple no lymphadenopathy Cardiovascular: heart regular rate and rhythm Lungs: clear to auscultation bilaterally Abdomen: soft, nontender, nondistended, no obvious ascites, no peritoneal signs, normal bowel sounds Extremities: no lower extremity edema bilaterally Skin: no lesions on visible extremities Rectal exam deferred for upcoming colonoscopy   Assessment and plan: 80 y.o. male with  routine risk for colon cancer, periodic anal itching  We'll proceed with screening colonoscopy at his soonest convenience. At the same time we'll dilate him for healing of the fissure. I see no reason for any further blood tests or imaging studies prior to then.

## 2014-02-13 NOTE — Patient Instructions (Signed)
You will be set up for a colonoscopy for colon cancer screening. 

## 2014-02-19 ENCOUNTER — Telehealth: Payer: Self-pay | Admitting: Gastroenterology

## 2014-02-19 NOTE — Telephone Encounter (Signed)
Left message on machine to call back  

## 2014-02-19 NOTE — Telephone Encounter (Signed)
Pt aware to continue miralax as needed until he begins to prep and was also re instructed on what foods to avoid prior to procedure.  He was advised to call back with any further concerns or questions

## 2014-02-23 ENCOUNTER — Ambulatory Visit (AMBULATORY_SURGERY_CENTER): Payer: Medicare Other | Admitting: Gastroenterology

## 2014-02-23 ENCOUNTER — Encounter: Payer: Self-pay | Admitting: Gastroenterology

## 2014-02-23 ENCOUNTER — Other Ambulatory Visit: Payer: Self-pay | Admitting: Gastroenterology

## 2014-02-23 VITALS — BP 154/86 | HR 63 | Temp 96.7°F | Resp 19 | Ht 71.0 in | Wt 256.0 lb

## 2014-02-23 DIAGNOSIS — K621 Rectal polyp: Secondary | ICD-10-CM

## 2014-02-23 DIAGNOSIS — K635 Polyp of colon: Secondary | ICD-10-CM

## 2014-02-23 DIAGNOSIS — Z1211 Encounter for screening for malignant neoplasm of colon: Secondary | ICD-10-CM

## 2014-02-23 DIAGNOSIS — D124 Benign neoplasm of descending colon: Secondary | ICD-10-CM

## 2014-02-23 DIAGNOSIS — K648 Other hemorrhoids: Secondary | ICD-10-CM

## 2014-02-23 DIAGNOSIS — D123 Benign neoplasm of transverse colon: Secondary | ICD-10-CM

## 2014-02-23 MED ORDER — HYDROCORTISONE ACE-PRAMOXINE 1-1 % RE CREA: 1.0000 "application " | TOPICAL_CREAM | Freq: Two times a day (BID) | RECTAL | Status: DC | PRN

## 2014-02-23 MED ORDER — SODIUM CHLORIDE 0.9 % IV SOLN: 500.0000 mL | INTRAVENOUS | Status: DC

## 2014-02-23 NOTE — Patient Instructions (Signed)
Discharge instructions given with verbal understanding. Handouts on polyps and hemorrhoids. Resume previous medications. YOU HAD AN ENDOSCOPIC PROCEDURE TODAY AT [facility]: Refer to the procedure report that was given to you for any specific questions about what was found during the examination.  If the procedure report does not answer your questions, please call your gastroenterologist to clarify.  If you requested that your care partner not be given the details of your procedure findings, then the procedure report has been included in a sealed envelope for you to review at your convenience later.  YOU SHOULD EXPECT: Some feelings of bloating in the abdomen. Passage of more gas than usual.  Walking can help get rid of the air that was put into your GI tract during the procedure and reduce the bloating. If you had a lower endoscopy (such as a colonoscopy or flexible sigmoidoscopy) you may notice spotting of blood in your stool or on the toilet paper. If you underwent a bowel prep for your procedure, then you may not have a normal bowel movement for a few days.  DIET: Your first meal following the procedure should be a light meal and then it is ok to progress to your normal diet.  A half-sandwich or bowl of soup is an example of a good first meal.  Heavy or fried foods are harder to digest and may make you feel nauseous or bloated.  Likewise meals heavy in dairy and vegetables can cause extra gas to form and this can also increase the bloating.  Drink plenty of fluids but you should avoid alcoholic beverages for 24 hours.  ACTIVITY: Your care partner should take you home directly after the procedure.  You should plan to take it easy, moving slowly for the rest of the day.  You can resume normal activity the day after the procedure however you should NOT DRIVE or use heavy machinery for 24 hours (because of the sedation medicines used during the test).    SYMPTOMS TO REPORT  IMMEDIATELY: A gastroenterologist can be reached at any hour.  During normal business hours, 8:30 AM to 5:00 PM Monday through Friday, call [phone].  After hours and on weekends, please call the GI answering service at [phone] who will take a message and have the physician on call contact you.   Following lower endoscopy (colonoscopy or flexible sigmoidoscopy):  Excessive amounts of blood in the stool  Significant tenderness or worsening of abdominal pains  Swelling of the abdomen that is new, acute  Fever of 100F or higher  FOLLOW UP: If any biopsies were taken you will be contacted by phone or by letter within the next 1-3 weeks.  Call your gastroenterologist if you have not heard about the biopsies in 3 weeks.  Our staff will call the home number listed on your records the next business day following your procedure to check on you and address any questions or concerns that you may have at that time regarding the information given to you following your procedure. This is a courtesy call and so if there is no answer at the home number and we have not heard from you through the emergency physician on call, we will assume that you have returned to your regular daily activities without incident.  SIGNATURES/CONFIDENTIALITY: You and/or your care partner have signed paperwork which will be entered into your electronic medical record.  These signatures attest to the fact that that the information above on your After Visit Summary  has been reviewed and is understood.  Full responsibility of the confidentiality of this discharge information lies with you and/or your care-partner. 

## 2014-02-23 NOTE — Progress Notes (Signed)
Called to room to assist during endoscopic procedure.  Patient ID and intended procedure confirmed with present staff. Received instructions for my participation in the procedure from the performing physician.  

## 2014-02-23 NOTE — Progress Notes (Signed)
Report to PACU, RN, vss, BBS= Clear.  

## 2014-02-23 NOTE — Op Note (Signed)
[facility]  [address] [place], [id number]   COLONOSCOPY PROCEDURE REPORT  PATIENT: [name], [name]  MR#: [id number] BIRTHDATE: 05-21-1932 , 81  yrs. old GENDER: male ENDOSCOPIST: Milus Banister, MD PROCEDURE DATE:  02/23/2014 PROCEDURE:   Colonoscopy with snare polypectomy First Screening Colonoscopy - Avg.  risk and is 50 yrs.  old or older - No.  Prior Negative Screening - Now for repeat screening. 10 or more years since last screening  History of Adenoma - Now for follow-up colonoscopy & has been > or = to 3 yrs.  N/A  Polyps Removed Today? Yes. ASA CLASS:   Class II INDICATIONS:average risk for colon cancer, also intermitent anal itching MEDICATIONS: Monitored anesthesia care and Propofol 200 mg IV  DESCRIPTION OF PROCEDURE:   After the risks benefits and alternatives of the procedure were thoroughly explained, informed consent was obtained.  The digital rectal exam revealed no abnormalities of the rectum.   The [id number]  endoscope was introduced through the anus and advanced to the cecum, which was identified by both the appendix and ileocecal valve. No adverse events experienced.   The quality of the prep was excellent.  The instrument was then slowly withdrawn as the colon was fully examined.  COLON FINDINGS: Three sessile polyps ranging between 3-45mm in size were found in the rectum and descending colon.  Polypectomies were performed with a cold snare.  The resection was complete, the polyp tissue was completely retrieved and sent to histology.   Small external and internal hemorrhoids were found.   The examination was otherwise normal.  Retroflexed views revealed no abnormalities. The time to cecum=1 minutes 43 seconds.  Withdrawal time=8 minutes 44 seconds.  The scope was withdrawn and the procedure completed. COMPLICATIONS: There were no immediate complications.  ENDOSCOPIC IMPRESSION: 1.   Three sessile polyps ranging between  3-67mm in size were found in the rectum and descending colon; polypectomies were performed with a cold snare 2.   Small external and internal hemorrhoids 3.   The examination was otherwise normal  RECOMMENDATIONS: 1.  Given your age, you will not need another colonoscopy for colon cancer screening or polyp surveillance.  These types of tests usually stop around the age 50.  You will receive a letter within 1-2 weeks with the results of your biopsy as well as final recommendations.  Please call my office if you have not received a letter after 3 weeks. 2.  New prescription was called in today for anal cream to use as needed for anal itching.  eSigned:  Milus Banister, MD 02/23/2014 10:56 AM

## 2014-02-26 ENCOUNTER — Telehealth: Payer: Self-pay | Admitting: *Deleted

## 2014-02-26 NOTE — Telephone Encounter (Signed)
No answer. Number identifier. Message left to call if any questions or concerns. 

## 2014-02-28 ENCOUNTER — Encounter: Payer: Self-pay | Admitting: Gastroenterology

## 2014-04-05 ENCOUNTER — Other Ambulatory Visit: Payer: Self-pay | Admitting: Dermatology

## 2014-04-30 ENCOUNTER — Telehealth: Payer: Self-pay | Admitting: General Practice

## 2014-04-30 NOTE — Telephone Encounter (Signed)
Dr Theodosia Blender pt. Will forward to her and her nurse to see if she wants an echo before his February appt.

## 2014-04-30 NOTE — Telephone Encounter (Signed)
To Dr. Turner for review and recommendations. 

## 2014-04-30 NOTE — Telephone Encounter (Signed)
New Message           Patient need to schedule an electocardiogram and doesn't have an order. Patient states he always has one before his yearly appt.

## 2014-05-01 ENCOUNTER — Other Ambulatory Visit: Payer: Self-pay | Admitting: Dermatology

## 2014-05-01 NOTE — Telephone Encounter (Signed)
I think he is referring to an EKG.  He will get that at [place]

## 2014-05-01 NOTE — Telephone Encounter (Signed)
Left message for patient that he will get EKG when he comes to his next office visit.  Instructed patient to call if he has any further questions or needs clarification.

## 2014-07-06 ENCOUNTER — Ambulatory Visit (INDEPENDENT_AMBULATORY_CARE_PROVIDER_SITE_OTHER): Payer: Medicare Other | Admitting: Cardiology

## 2014-07-06 ENCOUNTER — Encounter: Payer: Self-pay | Admitting: Cardiology

## 2014-07-06 VITALS — BP 180/90 | HR 61 | Ht 71.0 in | Wt 263.8 lb

## 2014-07-06 DIAGNOSIS — R609 Edema, unspecified: Secondary | ICD-10-CM

## 2014-07-06 DIAGNOSIS — R600 Localized edema: Secondary | ICD-10-CM

## 2014-07-06 DIAGNOSIS — I493 Ventricular premature depolarization: Secondary | ICD-10-CM

## 2014-07-06 DIAGNOSIS — I10 Essential (primary) hypertension: Secondary | ICD-10-CM

## 2014-07-06 MED ORDER — CLONIDINE HCL 0.2 MG PO TABS: 0.2000 mg | ORAL_TABLET | Freq: Two times a day (BID) | ORAL | Status: DC

## 2014-07-06 NOTE — Patient Instructions (Addendum)
Your physician has recommended you make the following change in your medication:  1) INCREASE Clonidine to 0.2 mg TWICE DAILY  Your physician recommends that you schedule a follow-up appointment in: [place] for a Nurse Visit blood pressure check.   Your physician wants you to follow-up in: 1 year with Dr. Radford Pax. You will receive a reminder letter in the mail two months in advance. If you don't receive a letter, please call our office to schedule the follow-up appointment.

## 2014-07-06 NOTE — Progress Notes (Signed)
Cardiology Office Note   Date:  07/06/2014   ID:  [name], DOB 1933/05/01, MRN [id number]  PCP:  Mathews Argyle, MD  Cardiologist:   Sueanne Margarita, MD   Chief Complaint  Patient presents with  . Hypertension      History of Present Illness: [name] is a 81 y.o. male with a history of HTN and PVC's presents today for followup. He is doing well. He denies any chest pain, dizziness, palpitations or syncope. His main complaint is that of chronic low back pain and when his back pain gets real bad he may get short winded some. He has chronic LE edema from the amlodipine and wears TED hose stockings to control it. In the am there is no edema but during the day he has some edema.  He has some chronic SOB that occurs primarily when his back is hurting and he stops breathing because he has so much back pain from his spinal stenosis.       Past Medical History  Diagnosis Date  . Hypertension   . Heart palpitations 2012  . Arthritis, lumbar spine   . BPH (benign prostatic hyperplasia)   . Edema of both legs     improved with use of meds/ compression socks used.  Marland Kitchen Dysrhythmia     1st degree heart block  palpitations 04/07/2011, seen & complete eval. /w Dr. Radford Pax- stress, echo, holter monitor, has been cleared for surg.   . Arthritis     degenerative arthritis   . Anxiety     "pt. denies" 10-11-12  . PVC's (premature ventricular contractions)     Past Surgical History  Procedure Laterality Date  . Total knee arthroplasty  2007    right total knee  . Total hip arthroplasty  2007    right total hip replacement  . Total shoulder arthroplasty Left 2007  . Back surgery  01/2011    02/2011  . Joint replacement      R knee replacement-  10/06, L shoulder - 3/07, R hip- replacement- '07  . Total knee arthroplasty  06/01/2011    Procedure: TOTAL KNEE ARTHROPLASTY;  Surgeon: Lorn Junes, MD;  Location: [facility];  Service: Orthopedics;  Laterality: Left;    . Total hip arthroplasty Left 10/14/2012    Procedure: LEFT TOTAL HIP ARTHROPLASTY ANTERIOR APPROACH;  Surgeon: Mcarthur Rossetti, MD;  Location: [facility];  Service: Orthopedics;  Laterality: Left;     Current Outpatient Prescriptions  Medication Sig Dispense Refill  . amLODipine (NORVASC) 5 MG tablet Take 5 mg by mouth every morning.     Marland Kitchen aspirin 81 MG tablet Take 81 mg by mouth daily.    . cloNIDine (CATAPRES) 0.1 MG tablet Take 0.1 mg by mouth 2 (two) times daily.      . diclofenac (VOLTAREN) 75 MG EC tablet Take 75 mg by mouth 2 (two) times daily.    Marland Kitchen docusate sodium (COLACE) 100 MG capsule Take 100 mg by mouth daily.    Marland Kitchen eplerenone (INSPRA) 50 MG tablet Take 50 mg by mouth every morning.    . finasteride (PROSCAR) 5 MG tablet Take 5 mg by mouth every morning.     . furosemide (LASIX) 40 MG tablet Take 40 mg by mouth every morning.     . gabapentin (NEURONTIN) 300 MG capsule Take 300 mg by mouth 3 (three) times daily as needed (pain).    . metoprolol (TOPROL-XL) 100 MG 24 hr tablet Take  150 mg by mouth every morning.     . Multiple Vitamins-Minerals (MULTIVITAMINS THER. W/MINERALS) TABS Take 1 tablet by mouth daily.      . Omega 3 1000 MG CAPS Take by mouth.    . pramoxine-hydrocortisone (ANALPRAM-HC) 1-1 % rectal cream Place 1 application rectally 2 (two) times daily as needed for hemorrhoids or itching. 30 g 11  . quinapril (ACCUPRIL) 40 MG tablet Take 40 mg by mouth every morning.     . traMADol (ULTRAM) 50 MG tablet     . triamcinolone (KENALOG) 0.025 % cream Apply 1 application topically 2 (two) times daily.     No current facility-administered medications for this visit.    Allergies:   Hydrocodone and Spironolactone    Social History:  The patient  reports that he quit smoking about 30 years ago. He has never used smokeless tobacco. He reports that he does not drink alcohol or use illicit drugs.   Family History:  The patient's family history includes Heart attack in  his mother; Heart disease in his sister; Hypertension in his mother; Suicidality in his father. There is no history of Anesthesia problems, Hypotension, Malignant hyperthermia, Pseudochol deficiency, Colon cancer, Esophageal cancer, Rectal cancer, or Stomach cancer.    ROS:  Please see the history of present illness.   Otherwise, review of systems are positive for none.   All other systems are reviewed and negative.    PHYSICAL EXAM: VS:  BP 180/90 mmHg  Pulse 61  Ht 5\' 11"  (1.803 m)  Wt 263 lb 12.8 oz (119.659 kg)  BMI 36.81 kg/m2 , BMI Body mass index is 36.81 kg/(m^2). GEN: Well nourished, well developed, in no acute distress HEENT: normal Neck: no JVD, carotid bruits, or masses Cardiac: RRR; no murmurs, rubs, or gallops, trace edema  Respiratory:  clear to auscultation bilaterally, normal work of breathing GI: soft, nontender, nondistended, + BS MS: no deformity or atrophy Skin: warm and dry, no rash Neuro:  Strength and sensation are intact Psych: euthymic mood, full affect   EKG:  EKG was ordered today showing NSR with no ST changes and first degree AV block    Recent Labs: No results found for requested labs within last 365 days.    Lipid Panel No results found for: CHOL, TRIG, HDL, CHOLHDL, VLDL, LDLCALC, LDLDIRECT    Wt Readings from Last 3 Encounters:  07/06/14 263 lb 12.8 oz (119.659 kg)  02/23/14 256 lb (116.121 kg)  02/13/14 256 lb (116.121 kg)        ASSESSMENT AND PLAN:    1.   PVC's asymptomatic - continue Toprol   2.   HTN- elevated.  At home it runs 120-125/69-63mmHg but he uses a wrist monitor which I am not sure is accurate.  On recheck of BP about 10 minutes later it was 188/24mmHg.  - continue amlodipine/Clonidine/Toprol/Quinapril  - increase Clonidine to 0.2mg  BID and BP check in 1 week with nurse   3. Chronic LE edema - stable on diuretics and TED hose stockings  Current medicines are reviewed at length with the patient  today.  The patient does not have concerns regarding medicines.  The following changes have been made:  no change  Labs/ tests ordered today include: BMET     Disposition:   FU with me in 1 year   Signed, Sueanne Margarita, MD  07/06/2014 11:24 AM    [address], [place], [place]  [id number] Phone: [phone];  Fax: [phone]

## 2014-07-13 ENCOUNTER — Ambulatory Visit (INDEPENDENT_AMBULATORY_CARE_PROVIDER_SITE_OTHER): Payer: Medicare Other | Admitting: *Deleted

## 2014-07-13 VITALS — BP 130/76 | HR 56 | Wt 262.0 lb

## 2014-07-13 DIAGNOSIS — I10 Essential (primary) hypertension: Secondary | ICD-10-CM

## 2014-07-13 NOTE — Progress Notes (Signed)
Pt in today for BP check. Reviewed pt's meds and allergies. Pt's manual BP was 130/76.  Also checked pt's BP with his personal arm and wrist cuffs that he brought with him to the office. Pt's BP cuffs showed systolic BP in the 454U and diastolic BP between 98-119. Pt's BP with office Dynamat was 168/89.  Pt admitted to being anxious due to readings from home cuffs and from being in the office in general.  Reviewed readings with Dr. Radford Pax, no changes made.  Pt encouraged to continue to monitor BP readings at home.

## 2014-12-19 ENCOUNTER — Encounter: Payer: Self-pay | Admitting: Gastroenterology

## 2015-03-08 ENCOUNTER — Other Ambulatory Visit: Payer: Self-pay | Admitting: *Deleted

## 2015-03-08 MED ORDER — CLONIDINE HCL 0.2 MG PO TABS: 0.2000 mg | ORAL_TABLET | Freq: Two times a day (BID) | ORAL | Status: DC

## 2015-09-10 ENCOUNTER — Other Ambulatory Visit: Payer: Self-pay | Admitting: Physical Medicine and Rehabilitation

## 2015-09-10 DIAGNOSIS — M545 Low back pain: Secondary | ICD-10-CM

## 2015-09-18 ENCOUNTER — Other Ambulatory Visit: Payer: Medicare Other

## 2015-09-24 ENCOUNTER — Ambulatory Visit
Admission: RE | Admit: 2015-09-24 | Discharge: 2015-09-24 | Disposition: A | Discharge status: Home / Self Care | Payer: Medicare Other | Source: Ambulatory Visit | Attending: Physical Medicine and Rehabilitation | Admitting: Physical Medicine and Rehabilitation

## 2015-09-24 DIAGNOSIS — M545 Low back pain: Secondary | ICD-10-CM

## 2015-12-30 ENCOUNTER — Other Ambulatory Visit: Payer: Self-pay | Admitting: Internal Medicine

## 2015-12-30 DIAGNOSIS — M79605 Pain in left leg: Secondary | ICD-10-CM

## 2015-12-31 ENCOUNTER — Other Ambulatory Visit: Payer: Medicare Other

## 2015-12-31 ENCOUNTER — Inpatient Hospital Stay (HOSPITAL_COMMUNITY)
Admission: AD | Admit: 2015-12-31 | Discharge: 2016-01-02 | DRG: 603 | Disposition: A | Discharge status: Home Health | Payer: Medicare Other | Source: Ambulatory Visit | Attending: Internal Medicine | Admitting: Internal Medicine

## 2015-12-31 ENCOUNTER — Encounter (HOSPITAL_COMMUNITY): Payer: Self-pay | Admitting: Family Medicine

## 2015-12-31 DIAGNOSIS — Z87891 Personal history of nicotine dependence: Secondary | ICD-10-CM | POA: Diagnosis not present

## 2015-12-31 DIAGNOSIS — F419 Anxiety disorder, unspecified: Secondary | ICD-10-CM | POA: Diagnosis present

## 2015-12-31 DIAGNOSIS — L03116 Cellulitis of left lower limb: Principal | ICD-10-CM | POA: Diagnosis present

## 2015-12-31 DIAGNOSIS — Z8249 Family history of ischemic heart disease and other diseases of the circulatory system: Secondary | ICD-10-CM

## 2015-12-31 DIAGNOSIS — I10 Essential (primary) hypertension: Secondary | ICD-10-CM | POA: Diagnosis present

## 2015-12-31 DIAGNOSIS — Z96653 Presence of artificial knee joint, bilateral: Secondary | ICD-10-CM | POA: Diagnosis present

## 2015-12-31 DIAGNOSIS — R600 Localized edema: Secondary | ICD-10-CM

## 2015-12-31 DIAGNOSIS — N179 Acute kidney failure, unspecified: Secondary | ICD-10-CM | POA: Diagnosis present

## 2015-12-31 DIAGNOSIS — Z96643 Presence of artificial hip joint, bilateral: Secondary | ICD-10-CM | POA: Diagnosis present

## 2015-12-31 DIAGNOSIS — M7989 Other specified soft tissue disorders: Secondary | ICD-10-CM | POA: Diagnosis present

## 2015-12-31 HISTORY — DX: Other specified postprocedural states: Z98.890

## 2015-12-31 HISTORY — DX: Nausea with vomiting, unspecified: R11.2

## 2015-12-31 LAB — CBC WITH DIFFERENTIAL/PLATELET
BASOS ABS: 0 10*3/uL (ref 0.0–0.1)
BASOS PCT: 0 %
EOS ABS: 0.1 10*3/uL (ref 0.0–0.7)
Eosinophils Relative: 1 %
HCT: 34.4 % — ABNORMAL LOW (ref 39.0–52.0)
HEMOGLOBIN: 11.7 g/dL — AB (ref 13.0–17.0)
Lymphocytes Relative: 12 %
Lymphs Abs: 1.3 10*3/uL (ref 0.7–4.0)
MCH: 33.2 pg (ref 26.0–34.0)
MCHC: 34 g/dL (ref 30.0–36.0)
MCV: 97.7 fL (ref 78.0–100.0)
MONOS PCT: 13 %
Monocytes Absolute: 1.3 10*3/uL — ABNORMAL HIGH (ref 0.1–1.0)
NEUTROS PCT: 74 %
Neutro Abs: 7.4 10*3/uL (ref 1.7–7.7)
PLATELETS: 217 10*3/uL (ref 150–400)
RBC: 3.52 MIL/uL — ABNORMAL LOW (ref 4.22–5.81)
RDW: 13.3 % (ref 11.5–15.5)
WBC: 10.2 10*3/uL (ref 4.0–10.5)

## 2015-12-31 LAB — BASIC METABOLIC PANEL
ANION GAP: 8 (ref 5–15)
BUN: 38 mg/dL — ABNORMAL HIGH (ref 6–20)
CHLORIDE: 97 mmol/L — AB (ref 101–111)
CO2: 27 mmol/L (ref 22–32)
Calcium: 9.2 mg/dL (ref 8.9–10.3)
Creatinine, Ser: 1.6 mg/dL — ABNORMAL HIGH (ref 0.61–1.24)
GFR, EST AFRICAN AMERICAN: 45 mL/min — AB (ref >60)
GFR, EST NON AFRICAN AMERICAN: 39 mL/min — AB (ref >60)
Glucose, Bld: 107 mg/dL — ABNORMAL HIGH (ref 65–99)
POTASSIUM: 5.1 mmol/L (ref 3.5–5.1)
SODIUM: 132 mmol/L — AB (ref 135–145)

## 2015-12-31 MED ORDER — ASPIRIN EC 81 MG PO TBEC
81.0000 mg | DELAYED_RELEASE_TABLET | Freq: Every day | ORAL | Status: DC
  Administered 2016-01-01 – 2016-01-02 (×2): 81 mg via ORAL
  Filled 2015-12-31 (×2): qty 1

## 2015-12-31 MED ORDER — ONDANSETRON HCL 4 MG PO TABS: 4.0000 mg | ORAL_TABLET | Freq: Four times a day (QID) | ORAL | Status: DC | PRN

## 2015-12-31 MED ORDER — DICLOFENAC SODIUM 75 MG PO TBEC
75.0000 mg | DELAYED_RELEASE_TABLET | Freq: Two times a day (BID) | ORAL | Status: DC
  Administered 2016-01-01 – 2016-01-02 (×3): 75 mg via ORAL
  Filled 2015-12-31 (×4): qty 1

## 2015-12-31 MED ORDER — SODIUM CHLORIDE 0.9 % IV SOLN: 250.0000 mL | INTRAVENOUS | Status: DC | PRN

## 2015-12-31 MED ORDER — TRAMADOL HCL 50 MG PO TABS: 50.0000 mg | ORAL_TABLET | Freq: Two times a day (BID) | ORAL | Status: DC | PRN

## 2015-12-31 MED ORDER — GABAPENTIN 300 MG PO CAPS
300.0000 mg | ORAL_CAPSULE | Freq: Three times a day (TID) | ORAL | Status: DC | PRN
  Administered 2016-01-01 (×2): 300 mg via ORAL
  Filled 2015-12-31 (×2): qty 1

## 2015-12-31 MED ORDER — FINASTERIDE 5 MG PO TABS
5.0000 mg | ORAL_TABLET | Freq: Every morning | ORAL | Status: DC
  Administered 2016-01-01 – 2016-01-02 (×2): 5 mg via ORAL
  Filled 2015-12-31 (×3): qty 1

## 2015-12-31 MED ORDER — ONDANSETRON HCL 4 MG/2ML IJ SOLN: 4.0000 mg | Freq: Four times a day (QID) | INTRAMUSCULAR | Status: DC | PRN

## 2015-12-31 MED ORDER — SODIUM CHLORIDE 0.9% FLUSH
3.0000 mL | INTRAVENOUS | Status: DC | PRN
  Administered 2016-01-01: 3 mL via INTRAVENOUS
  Filled 2015-12-31: qty 3

## 2015-12-31 MED ORDER — SODIUM CHLORIDE 0.9% FLUSH
3.0000 mL | Freq: Two times a day (BID) | INTRAVENOUS | Status: DC
  Administered 2016-01-01: 3 mL via INTRAVENOUS

## 2015-12-31 MED ORDER — ENOXAPARIN SODIUM 40 MG/0.4ML ~~LOC~~ SOLN
40.0000 mg | SUBCUTANEOUS | Status: DC
  Administered 2015-12-31: 40 mg via SUBCUTANEOUS
  Filled 2015-12-31: qty 0.4

## 2015-12-31 MED ORDER — CEFAZOLIN SODIUM-DEXTROSE 2-4 GM/100ML-% IV SOLN
2.0000 g | Freq: Three times a day (TID) | INTRAVENOUS | Status: DC
  Administered 2015-12-31 – 2016-01-01 (×3): 2 g via INTRAVENOUS
  Filled 2015-12-31 (×4): qty 100

## 2015-12-31 MED ORDER — ACETAMINOPHEN 325 MG PO TABS: 650.0000 mg | ORAL_TABLET | Freq: Four times a day (QID) | ORAL | Status: DC | PRN

## 2015-12-31 MED ORDER — ACETAMINOPHEN 650 MG RE SUPP: 650.0000 mg | Freq: Four times a day (QID) | RECTAL | Status: DC | PRN

## 2015-12-31 NOTE — Progress Notes (Signed)
Cellulites on left leg has been marked. On assessment the redness is marked all the way up his inner thigh area.

## 2015-12-31 NOTE — H&P (Signed)
History and Physical  [name] [id number] DOB: 1933/03/18 DOA: 12/31/2015  PCP: Mathews Argyle, MD  Patient coming from: home  Chief Complaint: weak  HPI:  82 year old man seen by PCP today in clinic for follow-up left leg cellulitis, referred  for direct admission for worsening cellulitis.   Reports first episode of left leg cellulitis was in April of this year, resolved with doxycycline. Second episode of cellulitis same leg June 2017, again result doxycycline. 8/11 developed redness and swelling of the left leg. This progressed over the weekend without specific aggravating or alleviating factors. He was seen at his 44 office 8/13 and started on Keflex and Bactrim. He took a few doses but symptoms worsened and so he went to see his doctor today and thus was sent to the emergency department.  Pertinent labs: Pending  Review of Systems:  Negative for fever, visual changes, sore throat, rash, new muscle aches, chest pain, SOB, dysuria, bleeding, n/v/abdominal pain.  Past Medical History:  Diagnosis Date  . Anxiety    "pt. denies" 10-11-12  . Arthritis    degenerative arthritis   . Arthritis, lumbar spine   . BPH (benign prostatic hyperplasia)   . Dysrhythmia    1st degree heart block  palpitations 04/07/2011, seen & complete eval. /w Dr. Radford Pax- stress, echo, holter monitor, has been cleared for surg.   . Edema of both legs    improved with use of meds/ compression socks used.  Marland Kitchen Heart palpitations 2012  . Hypertension   . PVC's (premature ventricular contractions)     Past Surgical History:  Procedure Laterality Date  . BACK SURGERY  01/2011   02/2011  . JOINT REPLACEMENT     R knee replacement-  10/06, L shoulder - 3/07, R hip- replacement- '07  . TOTAL HIP ARTHROPLASTY  2007   right total hip replacement  . TOTAL HIP ARTHROPLASTY Left 10/14/2012   Procedure: LEFT TOTAL HIP ARTHROPLASTY ANTERIOR APPROACH;  Surgeon: Mcarthur Rossetti, MD;   Location: [facility];  Service: Orthopedics;  Laterality: Left;  . TOTAL KNEE ARTHROPLASTY  2007   right total knee  . TOTAL KNEE ARTHROPLASTY  06/01/2011   Procedure: TOTAL KNEE ARTHROPLASTY;  Surgeon: Lorn Junes, MD;  Location: [facility];  Service: Orthopedics;  Laterality: Left;  . TOTAL SHOULDER ARTHROPLASTY Left 2007     reports that he quit smoking about 31 years ago. He has never used smokeless tobacco. He reports that he does not drink alcohol or use drugs. Ambulatory    Allergies  Allergen Reactions  . Spironolactone     nausea  . Hydrocodone Nausea And Vomiting    Family History  Problem Relation Age of Onset  . Heart attack Mother   . Hypertension Mother   . Suicidality Father   . Heart disease Sister   . Anesthesia problems Neg Hx   . Hypotension Neg Hx   . Malignant hyperthermia Neg Hx   . Pseudochol deficiency Neg Hx   . Colon cancer Neg Hx   . Esophageal cancer Neg Hx   . Rectal cancer Neg Hx   . Stomach cancer Neg Hx      Prior to Admission medications   Medication Sig Start Date End Date Taking? Authorizing Provider  amLODipine (NORVASC) 5 MG tablet Take 5 mg by mouth every morning.     Historical Provider, MD  aspirin 81 MG tablet Take 81 mg by mouth daily.    Historical Provider, MD  cloNIDine (CATAPRES) 0.2  MG tablet Take 1 tablet (0.2 mg total) by mouth 2 (two) times daily. 03/08/15   Sueanne Margarita, MD  diclofenac (VOLTAREN) 75 MG EC tablet Take 75 mg by mouth 2 (two) times daily.    Historical Provider, MD  docusate sodium (COLACE) 100 MG capsule Take 100 mg by mouth daily. 06/04/11   Kirstin Shepperson, PA-C  eplerenone (INSPRA) 50 MG tablet Take 50 mg by mouth every morning.    Historical Provider, MD  finasteride (PROSCAR) 5 MG tablet Take 5 mg by mouth every morning.     Historical Provider, MD  furosemide (LASIX) 40 MG tablet Take 40 mg by mouth every morning.     Historical Provider, MD  gabapentin (NEURONTIN) 300 MG capsule Take 300 mg by mouth 3  (three) times daily as needed (pain).    Historical Provider, MD  metoprolol (TOPROL-XL) 100 MG 24 hr tablet Take 150 mg by mouth every morning.     Historical Provider, MD  Multiple Vitamins-Minerals (MULTIVITAMINS THER. W/MINERALS) TABS Take 1 tablet by mouth daily.      Historical Provider, MD  Omega 3 1000 MG CAPS Take by mouth.    Historical Provider, MD  pramoxine-hydrocortisone [facility]) 1-1 % rectal cream Place 1 application rectally 2 (two) times daily as needed for hemorrhoids or itching. 02/23/14   Milus Banister, MD  quinapril (ACCUPRIL) 40 MG tablet Take 40 mg by mouth every morning.     Historical Provider, MD  traMADol Veatrice Bourbon) 50 MG tablet  05/30/13   Historical Provider, MD  triamcinolone (KENALOG) 0.025 % cream Apply 1 application topically 2 (two) times daily.    Historical Provider, MD    Physical Exam: Vitals:   12/31/15 1430  BP: 136/63  Pulse: 76  Resp: 20  Temp: 98.1 F (36.7 C)  TempSrc: Oral  SpO2: 97%  Weight: 125.3 kg (276 lb 3.8 oz)  Height: 5\' 10"  (1.778 m)    Constitutional:  . Appears calm and comfortable Eyes:  . PERRL and irises appear normal . Normal conjunctivae and lids ENMT:  . grossly normal hearing Lips and tongue appear unremarkable Neck:  . neck appears normal, no masses, normal ROM, supple . no thyromegaly Respiratory:  . CTA bilaterally, no w/r/r.  . Respiratory effort normal. No retractions or accessory muscle use Cardiovascular:  . RRR, no m/r/g 3+ left lower extremity edema . No right LE extremity edema   Abdomen:  . no tenderness or masses Musculoskeletal:  . RUE, LUE, RLE, LLE   o tone normal, no atrophy, no abnormal movements o No tenderness, masses Skin:  . Significant erythema below the left knee extending down to the foot. No purulence noted. . palpation of skin: no induration or nodules, nontender left leg Neurologic:  . Sensation bilateral lower extremities intact Psychiatric:  . judgement and insight  appear normal . Mental status o Orientation to person, place, time   Wt Readings from Last 3 Encounters:  12/31/15 125.3 kg (276 lb 3.8 oz)  07/13/14 118.8 kg (262 lb)  07/06/14 119.7 kg (263 lb 12.8 oz)    I have personally reviewed following labs and imaging studies  Labs on Admission:  CBC: No results for input(s): WBC, NEUTROABS, HGB, HCT, MCV, PLT in the last 168 hours. Basic Metabolic Panel: No results for input(s): NA, K, CL, CO2, GLUCOSE, BUN, CREATININE, CALCIUM, MG, PHOS in the last 168 hours.  Principal Problem:   Left leg cellulitis   Assessment/Plan 1. Left lower extremity cellulitis with massive  edema, patient reports 2 previous episodes in April and June of this year. 2. Status post left total knee replacement. No knee pain. No evidence of involvement of the joint.   Start empiric IV antibiotics, monitor clinically, mark borders.  DVT prophylaxis:Lovenox Code Status: full code Family Communication: none Disposition Plan: home when better   Consults called: none   Admission status: inpt    Time spent: 50 minutes  Murray Hodgkins, MD  [facility] Direct contact: [phone] --[place] [place]  --[email or web address]; password [email or web address]  7PM-7AM contact night coverage as above  12/31/2015, 3:59 PM

## 2015-12-31 NOTE — Progress Notes (Signed)
Consulted for direct admission from [facility] walk-in clinic by Dr.Ibarra  82 year old male with hypertension, anxiety and palpitations seen by PCP yesterday for left leg cellulitis and some wheezing. Prescribed Keflex and Bactrim. He presented to the walk-in clinic today feeling weak and more losing from the leg. No fever or chills. Vitals in the clinic showed low blood pressure of 90/64 mmHg (normally runs systolic [id number]). Labs done today showed WBC of 10.2. Patient also found to be very weak requiring some assistance to ambulate. Lives alone. Dr Sherrlyn Hock recommends direct admission  for IV antibiotics, hydration and physical therapy. Accepted to medical for at [facility] under observation.

## 2016-01-01 ENCOUNTER — Other Ambulatory Visit: Payer: Medicare Other

## 2016-01-01 DIAGNOSIS — L03116 Cellulitis of left lower limb: Principal | ICD-10-CM

## 2016-01-01 LAB — BASIC METABOLIC PANEL
Anion gap: 9 (ref 5–15)
BUN: 26 mg/dL — AB (ref 6–20)
CHLORIDE: 100 mmol/L — AB (ref 101–111)
CO2: 25 mmol/L (ref 22–32)
Calcium: 9.1 mg/dL (ref 8.9–10.3)
Creatinine, Ser: 1.35 mg/dL — ABNORMAL HIGH (ref 0.61–1.24)
GFR calc Af Amer: 56 mL/min — ABNORMAL LOW (ref >60)
GFR, EST NON AFRICAN AMERICAN: 48 mL/min — AB (ref >60)
GLUCOSE: 124 mg/dL — AB (ref 65–99)
POTASSIUM: 4.5 mmol/L (ref 3.5–5.1)
Sodium: 134 mmol/L — ABNORMAL LOW (ref 135–145)

## 2016-01-01 MED ORDER — METOPROLOL SUCCINATE ER 100 MG PO TB24
100.0000 mg | ORAL_TABLET | Freq: Every day | ORAL | Status: DC
  Administered 2016-01-01 – 2016-01-02 (×2): 100 mg via ORAL
  Filled 2016-01-01 (×2): qty 1

## 2016-01-01 MED ORDER — POLYETHYLENE GLYCOL 3350 17 G PO PACK: 17.0000 g | PACK | Freq: Every day | ORAL | Status: DC | PRN

## 2016-01-01 MED ORDER — ENOXAPARIN SODIUM 60 MG/0.6ML ~~LOC~~ SOLN
60.0000 mg | SUBCUTANEOUS | Status: DC
  Administered 2016-01-01: 60 mg via SUBCUTANEOUS
  Filled 2016-01-01: qty 0.6

## 2016-01-01 MED ORDER — DOXYCYCLINE HYCLATE 100 MG IV SOLR
100.0000 mg | Freq: Two times a day (BID) | INTRAVENOUS | Status: DC
  Administered 2016-01-01 (×2): 100 mg via INTRAVENOUS
  Filled 2016-01-01 (×3): qty 100

## 2016-01-01 MED ORDER — ALUM & MAG HYDROXIDE-SIMETH 200-200-20 MG/5ML PO SUSP
30.0000 mL | ORAL | Status: DC | PRN
  Administered 2016-01-01: 30 mL via ORAL
  Filled 2016-01-01: qty 30

## 2016-01-01 MED ORDER — ATORVASTATIN CALCIUM 10 MG PO TABS
10.0000 mg | ORAL_TABLET | Freq: Every day | ORAL | Status: DC
Start: 2016-01-01 — End: 2016-01-02
  Administered 2016-01-01 – 2016-01-02 (×2): 10 mg via ORAL
  Filled 2016-01-01 (×2): qty 1

## 2016-01-01 NOTE — Consult Note (Signed)
[facility] Nurse wound consult note Reason for Consult:LLE with edema, cellulitis, warmth and three ruptured blisters in the anterior portion of the LE Wound type: infectious Pressure Ulcer POA: No Measurement: Three blisters (ruptured)  On the anterior portion of the LLE:  Most distal measures 4.5cm x 4cm x 0.1cm, most medial measures 3cm x 2.5cm x 0.1cm and most proximal measures 2.5cm x 3cm x 0.1cm. All present with a red, wet wound bed and periwound maceration. The most distal wound is full thickness. Wound bed:As described above Drainage (amount, consistency, odor) Moderate to large amount of serous fluid Periwound:macerated Dressing procedure/placement/frequency: I will provide Nursing with topical wound care guidance for the use of the astringent and antimicrobial impregnated gauze (xeroform).  I will also provide an ACE wrap for mild compression for resolution of the edema.  Finally, a pressure redistribution heel boot is provided for elevation and pressure injury prevention of the LLE. [place] nursing team will not follow, but will remain available to this patient, the nursing and medical teams.  Please re-consult if needed. Thanks, Maudie Flakes, MSN, RN, [place], Arther Abbott  Pager# [phone]

## 2016-01-01 NOTE — Progress Notes (Signed)
[facility] Progress Note  Patient: [name] [id number]   PCP: Mathews Argyle, MD DOB: 02-27-1933   DOA: 12/31/2015   DOS: 01/01/2016   Date of Service: the patient was seen and examined on 01/01/2016  Subjective: Denies any acute complaint. No leg pain. No fever no chills. Complains of some fatigue. Nutrition: Tolerating oral diet  Brief hospital course: Pt. with PMH of hypertension, bilateral edema, joint replacement; admitted on 12/31/2015, with complaint of left leg swelling with blisters, was found to have cellulitis not responding to outpatient therapy. Currently further plan is continue IV doxycycline.  Assessment and Plan: 1. Left leg cellulitis Patient treated with Keflex and Bactrim as an outpatient. Presents with without any improvement despite taking this antibiotic. Initially started on Ancef. I would switch him to doxycycline. Finish 10 day treatment course. We will need outpatient wound care.  2. Essential hypertension. Blood pressure well controlled. Patient takes more than 3 antihypertensive medication. Currently I will resume Toprol-XL at lower dose.  3.. Chronic diastolic dysfunction. Patient is also taking diuretic at home as well as has chronic leg swelling. Currently on hold due to well-controlled blood pressure.  4. Acute kidney injury. Improving with hydration and holding off on diuretic. We will continue to hold. Probably from Bactrim.   Pain management: When necessary Tylenol Activity: Consulted physical therapy Bowel regimen: last BM 12/31/2015 Diet: Cardiac diet DVT Prophylaxis: subcutaneous Heparin  Advance goals of care discussion: Full code  Family Communication: no family was present at bedside, at the time of interview.  Disposition:  Discharge to home. Expected discharge date: 01/03/2016, improvement in infection  Consultants: Wound care Procedures: None  Antibiotics: Anti-infectives    Start     Dose/Rate Route  Frequency Ordered Stop   01/01/16 1100  doxycycline (VIBRAMYCIN) 100 mg in dextrose 5 % 250 mL IVPB     100 mg 125 mL/hr over 120 Minutes Intravenous Every 12 hours 01/01/16 1001     12/31/15 1630  ceFAZolin (ANCEF) IVPB 2g/100 mL premix  Status:  Discontinued     2 g 200 mL/hr over 30 Minutes Intravenous Every 8 hours 12/31/15 1559 01/01/16 1001        Intake/Output Summary (Last 24 hours) at 01/01/16 1836 Last data filed at 01/01/16 1500  Gross per 24 hour  Intake              910 ml  Output                0 ml  Net              910 ml   Filed Weights   12/31/15 1430  Weight: 125.3 kg (276 lb 3.8 oz)    Objective: Physical Exam: Vitals:   12/31/15 1430 12/31/15 2213 01/01/16 0500 01/01/16 1337  BP: 136/63 106/67 126/67 136/73  Pulse: 76 78 81 80  Resp: 20 20 20 18   Temp: 98.1 F (36.7 C) 97.9 F (36.6 C) 98 F (36.7 C) 98.3 F (36.8 C)  TempSrc: Oral Oral Oral Oral  SpO2: 97% 94% 97% 94%  Weight: 125.3 kg (276 lb 3.8 oz)     Height: 5\' 10"  (1.778 m)       General: Alert, Awake and Oriented to Time, Place and Person. Appear in mild distress Eyes: PERRL, Conjunctiva normal ENT: Oral Mucosa clear moist. Neck: difficult to assess JVD, no Abnormal Mass Or lumps Cardiovascular: S1 and S2 Present, aortic systolic Murmur, Respiratory: Bilateral Air entry equal and Decreased,  Clear to Auscultation, no Crackles, no wheezes Abdomen: Bowel Sound present, Soft and no tenderness Skin: bilateral redness Extremities: bilateral Pedal edema, no calf tenderness Neurologic: Grossly no focal neuro deficit. Bilaterally Equal motor strength  Data Reviewed: CBC:  Recent Labs Lab 12/31/15 1620  WBC 10.2  NEUTROABS 7.4  HGB 11.7*  HCT 34.4*  MCV 97.7  PLT A999333   Basic Metabolic Panel:  Recent Labs Lab 12/31/15 1620 01/01/16 0836  NA 132* 134*  K 5.1 4.5  CL 97* 100*  CO2 27 25  GLUCOSE 107* 124*  BUN 38* 26*  CREATININE 1.60* 1.35*  CALCIUM 9.2 9.1    Liver  Function Tests: No results for input(s): AST, ALT, ALKPHOS, BILITOT, PROT, ALBUMIN in the last 168 hours. No results for input(s): LIPASE, AMYLASE in the last 168 hours. No results for input(s): AMMONIA in the last 168 hours. Coagulation Profile: No results for input(s): INR, PROTIME in the last 168 hours. Cardiac Enzymes: No results for input(s): CKTOTAL, CKMB, CKMBINDEX, TROPONINI in the last 168 hours. BNP (last 3 results) No results for input(s): PROBNP in the last 8760 hours.  CBG: No results for input(s): GLUCAP in the last 168 hours.  Studies: No results found.   Scheduled Meds: . aspirin EC  81 mg Oral Daily  . atorvastatin  10 mg Oral Daily  . diclofenac  75 mg Oral BID  . doxycycline (VIBRAMYCIN) IV  100 mg Intravenous Q12H  . enoxaparin (LOVENOX) injection  60 mg Subcutaneous Q24H  . finasteride  5 mg Oral q morning - 10a  . metoprolol succinate  100 mg Oral Daily  . sodium chloride flush  3 mL Intravenous Q12H   Continuous Infusions:  PRN Meds: sodium chloride, acetaminophen **OR** acetaminophen, alum & mag hydroxide-simeth, gabapentin, ondansetron **OR** ondansetron (ZOFRAN) IV, polyethylene glycol, sodium chloride flush, traMADol  Time spent: 30 minutes  Author: Berle Mull, MD [facility] Pager: [phone] 01/01/2016 6:36 PM  If 7PM-7AM, please contact night-coverage at [email or web address], password [facility]

## 2016-01-02 LAB — BASIC METABOLIC PANEL
Anion gap: 8 (ref 5–15)
BUN: 22 mg/dL — AB (ref 6–20)
CHLORIDE: 100 mmol/L — AB (ref 101–111)
CO2: 26 mmol/L (ref 22–32)
CREATININE: 1.19 mg/dL (ref 0.61–1.24)
Calcium: 9 mg/dL (ref 8.9–10.3)
GFR calc Af Amer: >60 mL/min (ref >60)
GFR calc non Af Amer: 56 mL/min — ABNORMAL LOW (ref >60)
GLUCOSE: 106 mg/dL — AB (ref 65–99)
Potassium: 4.9 mmol/L (ref 3.5–5.1)
SODIUM: 134 mmol/L — AB (ref 135–145)

## 2016-01-02 LAB — MRSA PCR SCREENING: MRSA BY PCR: NEGATIVE

## 2016-01-02 MED ORDER — DOXYCYCLINE HYCLATE 100 MG PO TABS
100.0000 mg | ORAL_TABLET | Freq: Two times a day (BID) | ORAL | 0 refills | Status: AC
Start: 2016-01-02 — End: 2016-01-10

## 2016-01-02 MED ORDER — DOXYCYCLINE HYCLATE 100 MG PO TABS: 100.0000 mg | ORAL_TABLET | Freq: Two times a day (BID) | ORAL | Status: DC

## 2016-01-02 MED ORDER — FUROSEMIDE 20 MG PO TABS: 20.0000 mg | ORAL_TABLET | Freq: Every day | ORAL | 0 refills | Status: DC | PRN

## 2016-01-02 NOTE — Evaluation (Signed)
Physical Therapy Evaluation Patient Details Name: [name] MRN: [id number] DOB: [birth year] Today's Date: 01/02/2016   History of Present Illness  Pt is an 82 year old male with PMH of hypertension, bilateral edema, bil knee and hip joint replacements; admitted on 12/31/2015 with left leg cellulitis.  Clinical Impression  Patient evaluated by Physical Therapy with no further acute PT needs identified. All education has been completed and the patient has no further questions. Pt to d/c home today and agreeable to HHPT.  Pt states he has some generalized weakness since admission.  Pt reports he lives alone however states he has some friends coming to town to stay with him starting tomorrow. See below for any follow-up Physical Therapy or equipment needs. PT is signing off. Thank you for this referral.     Follow Up Recommendations Home health PT    Equipment Recommendations  None recommended by PT    Recommendations for Other Services       Precautions / Restrictions Precautions Precautions: None      Mobility  Bed Mobility               General bed mobility comments: pt up in recliner on arrival  Transfers Overall transfer level: Needs assistance Equipment used: Straight cane Transfers: Sit to/from Stand Sit to Stand: Supervision         General transfer comment: upon standing pt with LOB however able to ease himself back into chair, requires UE self assist  Ambulation/Gait Ambulation/Gait assistance: Min guard;Supervision Ambulation Distance (Feet): 160 Feet Assistive device: Straight cane (and hand rail) Gait Pattern/deviations: Step-to pattern;Decreased stance time - left;Antalgic     General Gait Details: slow but steady pace, recommended pt use RW or rollator upon d/c to assist with decreased WB on L LE   Stairs            Wheelchair Mobility    Modified Rankin (Stroke Patients Only)       Balance Overall balance assessment:  (pt  denies hx of falls)                                           Pertinent Vitals/Pain Pain Assessment: No/denies pain    Home Living Family/patient expects to be discharged to:: Private residence Living Arrangements: Alone Available Help at Discharge: Available PRN/intermittently;Friend(s) Type of Home: House Home Access: Stairs to enter Entrance Stairs-Rails: None Entrance Stairs-Number of Steps: 1 Home Layout: One level Home Equipment: Environmental consultant - 4 wheels      Prior Function Level of Independence: Independent               Hand Dominance        Extremity/Trunk Assessment               Lower Extremity Assessment: LLE deficits/detail   LLE Deficits / Details: L lower leg redness/swelling dx cellulitis, able to to perform some ankle motion - slightly limited by edema     Communication   Communication: No difficulties  Cognition Arousal/Alertness: Awake/alert Behavior During Therapy: WFL for tasks assessed/performed Overall Cognitive Status: Within Functional Limits for tasks assessed                      General Comments      Exercises        Assessment/Plan    PT Assessment All further PT  needs can be met in the next venue of care  PT Diagnosis Difficulty walking   PT Problem List Decreased strength;Decreased activity tolerance;Decreased balance;Decreased mobility;Decreased skin integrity  PT Treatment Interventions     PT Goals (Current goals can be found in the Care Plan section) Acute Rehab PT Goals PT Goal Formulation: All assessment and education complete, DC therapy    Frequency     Barriers to discharge        Co-evaluation               End of Session   Activity Tolerance: Patient tolerated treatment well Patient left: in chair;with call bell/phone within reach Nurse Communication: Mobility status         Time: 1023-1034 PT Time Calculation (min) (ACUTE ONLY): 11 min   Charges:   PT  Evaluation $PT Eval Low Complexity: 1 Procedure     PT G Codes:        Ilanna Deihl,KATHrine E 01/02/2016, 12:44 PM Carmelia Bake, PT, DPT 01/02/2016 Pager: [phone]

## 2016-01-02 NOTE — Care Management Note (Signed)
Case Management Note  Patient Details  Name: [name] MRN: [id number] Date of Birth: October 27, 1932  Subjective/Objective:HHRN ordered. Offered patient choice-[facility] chosen for HHC-PT recc HHPT.[facility] rep Santiago Glad aware of [place] orders. Await d/c order.                    Action/Plan:d/c home w/HHC.   Expected Discharge Date:   (unknown)               Expected Discharge Plan:     In-House Referral:     Discharge planning Services  CM Consult  Post Acute Care Choice:    Choice offered to:     DME Arranged:    DME Agency:     HH Arranged:  RN, PT [place] Agency:  [place]  Status of Service:  Completed, signed off  If discussed at [place] of Stay Meetings, dates discussed:    Additional Comments:  Dessa Phi, RN 01/02/2016, 10:52 AM

## 2016-01-05 NOTE — Discharge Summary (Signed)
[facility] Discharge Summary   Patient: [name] [id number]   PCP: Mathews Argyle, MD DOB: 11/29/1932   Date of admission: 12/31/2015   Date of discharge: 01/02/2016   Discharge Diagnoses:  Principal Problem:   Left leg cellulitis   Admitted From: home Disposition:  home  Recommendations for Outpatient Follow-up:  1. Follow-up with PCP in one week. Next and follow-up with home health nurse for wound care.   Follow-up Information    [place] [facility] .   Why:  HH nursing-wound care-instruction, HH physical therapy. Contact information: [place] [address] [phone]        Mathews Argyle, MD Follow up in 1 week(s).   Specialty:  Internal Medicine Contact information: [address] [place] [address] [phone]          Diet recommendation: Cardiac diet  Activity: The patient is advised to gradually reintroduce usual activities.  Discharge Condition: good  Code Status: full code  History of present illness: As per the H and P dictated on admission, "82 year old man seen by PCP today in clinic for follow-up left leg cellulitis, referred  for direct admission for worsening cellulitis.   Reports first episode of left leg cellulitis was in April of this year, resolved with doxycycline. Second episode of cellulitis same leg June 2017, again result doxycycline. 8/11 developed redness and swelling of the left leg. This progressed over the weekend without specific aggravating or alleviating factors. He was seen at his 38 office 8/13 and started on Keflex and Bactrim. He took a few doses but symptoms worsened and so he went to see his doctor today and thus was sent to the emergency department"  Hospital Course:  Summary of his active problems in the hospital is as following. Principal Problem:   Left leg cellulitis Patient presented with complaints of worsening cellulitis of  the leg. Patient was started on IV doxycycline with which improvement was noted significant in his cellulitis. Blood cultures remain negative. Neck supple MRSA PCR was also negative. Physical therapy was consulted. As well. Wound care was also consulted. With improvement in patient's cellulitis it was felt the patient is a 52 discharged home. Patient will finish 10 days of the doxycycline.  All other chronic medical condition were stable during the hospitalization.  Patient was seen by physical therapy, who recommended home health, which was arranged by Education officer, museum and case Freight forwarder. On the day of the discharge the patient's vitals are stable, and no other acute medical condition were reported by patient. the patient was felt safe to be discharge at home with therapy.  Procedures and Results:  none   Consultations:  none  DISCHARGE MEDICATION: Discharge Medication List as of 01/02/2016 12:20 PM    START taking these medications   Details  doxycycline (VIBRA-TABS) 100 MG tablet Take 1 tablet (100 mg total) by mouth every 12 (twelve) hours., Starting Thu 01/02/2016, Until Fri 01/10/2016, Normal      CONTINUE these medications which have CHANGED   Details  furosemide (LASIX) 20 MG tablet Take 1 tablet (20 mg total) by mouth daily as needed for fluid or edema., Starting Thu 01/02/2016, Normal      CONTINUE these medications which have NOT CHANGED   Details  aspirin 81 MG tablet Take 81 mg by mouth daily., Historical Med    atorvastatin (LIPITOR) 10 MG tablet Take 10 mg by mouth daily. , Starting Sun 10/27/2015, Historical Med    diclofenac (  VOLTAREN) 75 MG EC tablet Take 75 mg by mouth 2 (two) times daily., Historical Med    finasteride (PROSCAR) 5 MG tablet Take 5 mg by mouth every morning. , Until Discontinued, Historical Med    gabapentin (NEURONTIN) 300 MG capsule Take 300 mg by mouth 3 (three) times daily. , Historical Med    metoprolol (TOPROL-XL) 100 MG 24 hr tablet Take  150 mg by mouth every morning. , Until Discontinued, Historical Med    Multiple Vitamins-Minerals (MULTIVITAMINS THER. W/MINERALS) TABS Take 1 tablet by mouth daily.  , Until Discontinued, Historical Med    polyethylene glycol (MIRALAX / GLYCOLAX) packet Take 17 g by mouth daily as needed for mild constipation., Historical Med    pramoxine-hydrocortisone [facility]) 1-1 % rectal cream Place 1 application rectally 2 (two) times daily as needed for hemorrhoids or itching., Starting Fri 02/23/2014, Normal    traMADol (ULTRAM) 50 MG tablet Take 50 mg by mouth every 6 (six) hours as needed for moderate pain or severe pain. , Starting Tue 05/30/2013, Historical Med    triamcinolone (KENALOG) 0.025 % cream Apply 1 application topically 2 (two) times daily as needed (itching). , Historical Med      STOP taking these medications     amLODipine (NORVASC) 2.5 MG tablet      cephALEXin (KEFLEX) 500 MG capsule      cloNIDine (CATAPRES) 0.2 MG tablet      eplerenone (INSPRA) 25 MG tablet      quinapril (ACCUPRIL) 40 MG tablet      sulfamethoxazole-trimethoprim (BACTRIM DS,SEPTRA DS) 800-160 MG tablet        Allergies  Allergen Reactions  . Spironolactone     nausea  . Hydrocodone Nausea And Vomiting   Discharge Instructions    Diet - low sodium heart healthy    Complete by:  As directed   Discharge instructions    Complete by:  As directed   It is important that you read following instructions as well as go over your medication list with RN to help you understand your care after this hospitalization.  Discharge Instructions: Please follow-up with PCP in one week  Please request your primary care physician to go over all Hospital Tests and Procedure/Radiological results at the follow up,  Please get all Hospital records sent to your PCP by signing hospital release before you go home.   Do not take more than prescribed Pain, Sleep and Anxiety Medications. You were cared for by a  hospitalist during your hospital stay. If you have any questions about your discharge medications or the care you received while you were in the hospital after you are discharged, you can call the unit and ask to speak with the hospitalist on call if the hospitalist that took care of you is not available.  Once you are discharged, your primary care physician will handle any further medical issues. Please note that NO REFILLS for any discharge medications will be authorized once you are discharged, as it is imperative that you return to your primary care physician (or establish a relationship with a primary care physician if you do not have one) for your aftercare needs so that they can reassess your need for medications and monitor your lab values. You Must read complete instructions/literature along with all the possible adverse reactions/side effects for all the Medicines you take and that have been prescribed to you. Take any new Medicines after you have completely understood and accept all the possible adverse reactions/side effects.  Wear Seat belts while driving.   Home Health    Complete by:  As directed   To provide the following care/treatments:  PT   Increase activity slowly    Complete by:  As directed     Discharge Exam: Filed Weights   12/31/15 1430  Weight: 125.3 kg (276 lb 3.8 oz)   Vitals:   01/01/16 2111 01/02/16 0510  BP: (!) 142/80 (!) 151/87  Pulse: 76 79  Resp: 20 18  Temp: 98.2 F (36.8 C) 97.6 F (36.4 C)   General: Appear in no distress, bilateral Rash; Oral Mucosa moist. Cardiovascular: S1 and S2 Present, no Murmur, no JVD Respiratory: Bilateral Air entry present and Clear to Auscultation, no Crackles, no wheezes Abdomen: Bowel Sound present, Soft and no tenderness Extremities: bilateral Pedal edema, no calf tenderness Neurology: Grossly no focal neuro deficit.  The results of significant diagnostics from this hospitalization (including imaging, microbiology,  ancillary and laboratory) are listed below for reference.    Significant Diagnostic Studies: No results found.  Microbiology: Recent Results (from the past 240 hour(s))  MRSA PCR Screening     Status: None   Collection Time: 01/02/16  9:41 AM  Result Value Ref Range Status   MRSA by PCR NEGATIVE NEGATIVE Final    Comment:        The GeneXpert MRSA Assay (FDA approved for NASAL specimens only), is one component of a comprehensive MRSA colonization surveillance program. It is not intended to diagnose MRSA infection nor to guide or monitor treatment for MRSA infections.      Labs: CBC:  Recent Labs Lab 12/31/15 1620  WBC 10.2  NEUTROABS 7.4  HGB 11.7*  HCT 34.4*  MCV 97.7  PLT A999333   Basic Metabolic Panel:  Recent Labs Lab 12/31/15 1620 01/01/16 0836 01/02/16 0545  NA 132* 134* 134*  K 5.1 4.5 4.9  CL 97* 100* 100*  CO2 27 25 26   GLUCOSE 107* 124* 106*  BUN 38* 26* 22*  CREATININE 1.60* 1.35* 1.19  CALCIUM 9.2 9.1 9.0   Time spent: 30 minutes  Signed:  Darsi Tien  [facility] 01/02/2016 , 5:01 PM

## 2017-01-11 ENCOUNTER — Other Ambulatory Visit: Payer: Self-pay | Admitting: Geriatric Medicine

## 2017-01-11 ENCOUNTER — Ambulatory Visit
Admission: RE | Admit: 2017-01-11 | Discharge: 2017-01-11 | Disposition: A | Discharge status: Home / Self Care | Payer: Medicare Other | Source: Ambulatory Visit | Attending: Geriatric Medicine | Admitting: Geriatric Medicine

## 2017-01-11 DIAGNOSIS — R059 Cough, unspecified: Secondary | ICD-10-CM

## 2017-01-11 DIAGNOSIS — R05 Cough: Secondary | ICD-10-CM

## 2017-07-28 ENCOUNTER — Other Ambulatory Visit: Payer: Self-pay | Admitting: Geriatric Medicine

## 2017-07-28 DIAGNOSIS — M545 Low back pain: Secondary | ICD-10-CM

## 2017-08-10 ENCOUNTER — Ambulatory Visit
Admission: RE | Admit: 2017-08-10 | Discharge: 2017-08-10 | Disposition: A | Discharge status: Home / Self Care | Payer: Medicare Other | Source: Ambulatory Visit | Attending: Geriatric Medicine | Admitting: Geriatric Medicine

## 2017-08-10 DIAGNOSIS — M545 Low back pain: Secondary | ICD-10-CM

## 2018-02-17 ENCOUNTER — Other Ambulatory Visit: Payer: Self-pay | Admitting: Geriatric Medicine

## 2018-02-17 DIAGNOSIS — M545 Low back pain, unspecified: Secondary | ICD-10-CM

## 2018-03-03 ENCOUNTER — Other Ambulatory Visit: Payer: Medicare Other

## 2018-03-11 ENCOUNTER — Other Ambulatory Visit: Payer: Medicare Other

## 2018-06-21 ENCOUNTER — Ambulatory Visit: Payer: Medicare Other | Admitting: Allergy and Immunology

## 2018-06-21 ENCOUNTER — Telehealth: Payer: Self-pay

## 2018-06-21 ENCOUNTER — Encounter: Payer: Self-pay | Admitting: Allergy and Immunology

## 2018-06-21 VITALS — BP 114/62 | HR 64 | Temp 97.4°F | Resp 18 | Ht 70.5 in | Wt 290.0 lb

## 2018-06-21 DIAGNOSIS — J3089 Other allergic rhinitis: Secondary | ICD-10-CM

## 2018-06-21 DIAGNOSIS — L2089 Other atopic dermatitis: Secondary | ICD-10-CM | POA: Diagnosis not present

## 2018-06-21 MED ORDER — PIMECROLIMUS 1 % EX CREA: TOPICAL_CREAM | CUTANEOUS | 1 refills | Status: DC

## 2018-06-21 MED ORDER — MOMETASONE FUROATE 0.1 % EX OINT: TOPICAL_OINTMENT | CUTANEOUS | 1 refills | Status: DC

## 2018-06-21 NOTE — Patient Instructions (Addendum)
  1.  Review all blood tests performed by Dr. Felipa Eth.  Further evaluation?  2.  Treat skin lesions with a combination of Elidel followed by mometasone 0.1% ointment 1-2 times per day until resolved  3.  Continue Benadryl 25 mg twice a day  4.  Dupilumab?  Submit for insurance approval  5.  Return to clinic in 3 weeks or earlier if problem

## 2018-06-21 NOTE — Telephone Encounter (Signed)
Will work on approval and submit and be in contact with patient.

## 2018-06-21 NOTE — Progress Notes (Signed)
Dear Dr. Felipa Eth,  Thank you for referring [name] to the [facility] on 06/21/2018.   Below is a summation of this patient's evaluation and recommendations.  Thank you for your referral. I will keep you informed about this patient's response to treatment.   If you have any questions please do not hesitate to contact me.   Sincerely,  Jiles Prows, MD Allergy / Immunology [place]   ______________________________________________________________________    NEW PATIENT NOTE  Referring Provider: Lajean Manes, MD Primary Provider: Lajean Manes, MD Date of office visit: 06/21/2018    Subjective:   Chief Complaint:  [name] (DOB: 27-Oct-1932) is a 85 y.o. male who presents to the clinic on 06/21/2018 with a chief complaint of Pruritis .     HPI: [name] presents to this clinic in evaluation of dermatitis.  Apparently over the course of the past 2 years he has had itchy areas of his skin that develop red scaly lesions.  He has been evaluated by Dr. Wilhemina Bonito, dermatologist, and apparently has been treated with topical triamcinolone.  When he uses topical triamcinolone this does appear to result in less redness and less thickness of his lesions but he has a very difficult time using triamcinolone.  He has various musculoskeletal issues and he lives alone and is difficult for him to apply triamcinolone to the areas that are significantly involved.  Most of his involvement involves his lower back and his buttocks and thighs and to some degree his arms and occasionally his chest.  He has been taking Benadryl over the course of the past 6 months on a consistent basis at 25 mg twice a day which does help his itchiness somewhat.  There is no other associated systemic or constitutional symptoms associated with this dermatitis.  He has not really noticed an obvious provoking  factor giving rise to this issue.  There does not appear to be the use of a specific medication tied up with the development of this dermatitis.  He does take a multivitamin on a regular basis and he takes omega-3 over the course of the past month or so.  He does have an atopic history with seasonal allergic rhinitis that is under excellent control with the use of montelukast at this point.  He has apparently had some issues with dyspnea on exertion that required evaluation including an echocardiogram and chest x-ray which apparently has been normal 2 years ago.  He does have a smoking history of approximately 10 years which he discontinued towards the end of his 67s.  Past Medical History:  Diagnosis Date  . Anxiety    "pt. denies" 10-11-12  . Arthritis    degenerative arthritis   . Arthritis, lumbar spine   . BPH (benign prostatic hyperplasia)   . Dysrhythmia    1st degree heart block  palpitations 04/07/2011, seen & complete eval. /w Dr. Radford Pax- stress, echo, holter monitor, has been cleared for surg.   . Edema of both legs    improved with use of meds/ compression socks used.  [facility] Heart palpitations 2012  . Hypertension   . PONV (postoperative nausea and vomiting)   . PVC's (premature ventricular contractions)     Past Surgical History:  Procedure Laterality Date  . BACK SURGERY  01/2011   02/2011  . JOINT REPLACEMENT     R knee replacement-  10/06, L shoulder -  3/07, R hip- replacement- '07  . TOTAL HIP ARTHROPLASTY  2007   right total hip replacement  . TOTAL HIP ARTHROPLASTY Left 10/14/2012   Procedure: LEFT TOTAL HIP ARTHROPLASTY ANTERIOR APPROACH;  Surgeon: Mcarthur Rossetti, MD;  Location: [facility];  Service: Orthopedics;  Laterality: Left;  . TOTAL KNEE ARTHROPLASTY  2007   right total knee  . TOTAL KNEE ARTHROPLASTY  06/01/2011   Procedure: TOTAL KNEE ARTHROPLASTY;  Surgeon: Lorn Junes, MD;  Location: [facility];  Service: Orthopedics;  Laterality: Left;  . TOTAL SHOULDER  ARTHROPLASTY Left 2007    Allergies as of 06/21/2018      Reactions   Spironolactone    nausea   Hydrocodone Nausea And Vomiting      Medication List      amLODipine 2.5 MG tablet Commonly known as:  NORVASC Take 2.5 mg by mouth daily.   cloNIDine 0.2 MG tablet Commonly known as:  CATAPRES Take 1 tablet by mouth 2 (two) times daily.   diclofenac 75 MG EC tablet Commonly known as:  VOLTAREN Take 75 mg by mouth 2 (two) times daily.   diphenhydrAMINE 25 MG tablet Commonly known as:  BENADRYL Take 25 mg by mouth 2 (two) times daily.   eplerenone 25 MG tablet Commonly known as:  INSPRA Take 1 tablet by mouth daily.   fexofenadine 180 MG tablet Commonly known as:  ALLEGRA Take 180 mg by mouth daily.   finasteride 5 MG tablet Commonly known as:  PROSCAR Take 5 mg by mouth every morning.   furosemide 20 MG tablet Commonly known as:  LASIX Take 1 tablet (20 mg total) by mouth daily as needed for fluid or edema.   gabapentin 300 MG capsule Commonly known as:  NEURONTIN Take 300 mg by mouth 3 (three) times daily.   metoprolol succinate 100 MG 24 hr tablet Commonly known as:  TOPROL-XL Take 150 mg by mouth every morning.   montelukast 10 MG tablet Commonly known as:  SINGULAIR Take 10 mg by mouth daily.   multivitamins ther. w/minerals Tabs tablet Take 1 tablet by mouth daily.   polyethylene glycol packet Commonly known as:  MIRALAX / GLYCOLAX Take 17 g by mouth daily as needed for mild constipation.   quinapril 40 MG tablet Commonly known as:  ACCUPRIL   rosuvastatin 5 MG tablet Commonly known as:  CRESTOR Take 5 mg by mouth daily.   traMADol 50 MG tablet Commonly known as:  ULTRAM Take 50 mg by mouth every 6 (six) hours as needed for moderate pain or severe pain.   triamcinolone 0.025 % cream Commonly known as:  KENALOG Apply 1 application topically 2 (two) times daily as needed (itching).       Review of systems negative except as noted in HPI /  PMHx or noted below:  Review of Systems  Constitutional: Negative.   HENT: Negative.   Eyes: Negative.   Respiratory: Negative.   Cardiovascular: Negative.   Gastrointestinal: Negative.   Genitourinary: Negative.   Musculoskeletal: Negative.   Skin: Negative.   Neurological: Negative.   Endo/Heme/Allergies: Negative.   Psychiatric/Behavioral: Negative.     Family History  Problem Relation Age of Onset  . Heart attack Mother   . Hypertension Mother   . Suicidality Father   . Heart disease Sister   . Anesthesia problems Neg Hx   . Hypotension Neg Hx   . Malignant hyperthermia Neg Hx   . Pseudochol deficiency Neg Hx   . Colon cancer  Neg Hx   . Esophageal cancer Neg Hx   . Rectal cancer Neg Hx   . Stomach cancer Neg Hx     Social History   Socioeconomic History  . Marital status: Single    Spouse name: Not on file  . Number of children: Not on file  . Years of education: Not on file  . Highest education level: Not on file  Occupational History  . Not on file  Social Needs  . Financial resource strain: Not on file  . Food insecurity:    Worry: Not on file    Inability: Not on file  . Transportation needs:    Medical: Not on file    Non-medical: Not on file  Tobacco Use  . Smoking status: Former Smoker    Last attempt to quit: 03/18/1984    Years since quitting: 34.2  . Smokeless tobacco: Never Used  Substance and Sexual Activity  . Alcohol use: No    Alcohol/week: 1.0 standard drinks    Types: 1 Glasses of wine per week    Comment: glass of wine- holidays, or less   . Drug use: No  . Sexual activity: Never  Lifestyle  . Physical activity:    Days per week: Not on file    Minutes per session: Not on file  . Stress: Not on file  Relationships  . Social connections:    Talks on phone: Not on file    Gets together: Not on file    Attends religious service: Not on file    Active member of club or organization: Not on file    Attends meetings of clubs or  organizations: Not on file    Relationship status: Not on file  . Intimate partner violence:    Fear of current or ex partner: Not on file    Emotionally abused: Not on file    Physically abused: Not on file    Forced sexual activity: Not on file  Other Topics Concern  . Not on file  Social History Narrative  . Not on file    Environmental and Social history  Lives in a townhouse with a dry environment, no animals located in the household, carpet in the bedroom, plastic on the bed, no plastic on the pillow, no smoking ongoing inside the household.  He is a retired Network engineer.  Objective:   Vitals:   06/21/18 1401  BP: 114/62  Pulse: 64  Resp: 18  Temp: (!) 97.4 F (36.3 C)  SpO2: 94%   Height: 5' 10.5" (179.1 cm)(patient reported) Weight: 290 lb (131.5 kg)  Physical Exam Constitutional:      Appearance: He is not diaphoretic.     Comments: Walker  HENT:     Head: Normocephalic.     Right Ear: Tympanic membrane, ear canal and external ear normal.     Left Ear: Tympanic membrane, ear canal and external ear normal.     Nose: Nose normal. No mucosal edema or rhinorrhea.     Mouth/Throat:     Pharynx: Uvula midline. No oropharyngeal exudate.  Eyes:     Conjunctiva/sclera: Conjunctivae normal.  Neck:     Thyroid: No thyromegaly.     Trachea: Trachea normal. No tracheal tenderness or tracheal deviation.  Cardiovascular:     Rate and Rhythm: Normal rate and regular rhythm.     Heart sounds: Normal heart sounds, S1 normal and S2 normal. No murmur.  Pulmonary:     Effort: No respiratory distress.  Breath sounds: Normal breath sounds. No stridor. No wheezing or rales.  Lymphadenopathy:     Head:     Right side of head: No tonsillar adenopathy.     Left side of head: No tonsillar adenopathy.     Cervical: No cervical adenopathy.  Skin:    Findings: Rash (Multiple large scaly plaques involving hips, lower back, flanks, small 1 cm lesions involving upper extremities.   Diffusely red face reminiscent of rosacea.) present. No erythema.     Nails: There is no clubbing.   Neurological:     Mental Status: He is alert.     Diagnostics: Allergy skin tests were not performed secondary to the recent use of an antihistamine.   Assessment and Plan:    1. Other atopic dermatitis   2. Other allergic rhinitis     1.  Review all blood tests performed by Dr. Felipa Eth.  Further evaluation?  2.  Treat skin lesions with a combination of Elidel followed by mometasone 0.1% ointment 1-2 times per day until resolved  3.  Continue Benadryl 25 mg twice a day  4.  Dupilumab?  Submit for insurance approval  5.  Return to clinic in 3 weeks or earlier if problem  It is quite possible that [name] has atopic dermatitis and we will treat him with a combination of a calcineurin inhibitor and a topical steroid as noted above.  As well, because he has had such significant problems over these past 2 years and has basically failed medical treatment we will see if he is a candidate for dupilumab administration.  I will review all the blood test performed by Dr. Felipa Eth over the course of the past year and see if there is the need for any additional testing at this point.  I will see him back in this clinic in 3 weeks to assess his response to this approach.  Jiles Prows, MD Allergy / Immunology [facility]

## 2018-06-21 NOTE — Telephone Encounter (Signed)
Dr. Neldon Mc would like patient to start Dupixent injections. Please advise and thank you.

## 2018-06-22 ENCOUNTER — Telehealth: Payer: Self-pay | Admitting: *Deleted

## 2018-06-22 ENCOUNTER — Encounter: Payer: Self-pay | Admitting: Allergy and Immunology

## 2018-06-22 NOTE — Telephone Encounter (Signed)
[facility] called stating [facility] doesn't [facility] and patient states [facility] also said one of his medications needs approval. Patient cannot remember the name of the medications or which one needs approval. Please advise

## 2018-06-22 NOTE — Telephone Encounter (Signed)
PA initiated through [email or web address]. A waiting for approval   Key: [id number] - PA Case ID: [id number]

## 2018-06-22 NOTE — Telephone Encounter (Signed)
PA approved faxed to [facility] [phone]

## 2018-06-23 ENCOUNTER — Telehealth: Payer: Self-pay | Admitting: Allergy and Immunology

## 2018-06-23 NOTE — Telephone Encounter (Signed)
Pt called and said that his rx was not called in, and they were sent to [facility] and he does not use that on he needs to have Elocon [facility]idel [facility] [address]. He said that [facility] had called him and said that one of the creams needed auth. [facility] ins[facility] [address] He would like a call [phone].

## 2018-06-23 NOTE — Telephone Encounter (Signed)
Noted  

## 2018-06-23 NOTE — Telephone Encounter (Signed)
Spoke with patient and advised that is is safe to use both creams/ointments at the same time and instructions on how to use them.

## 2018-06-23 NOTE — Telephone Encounter (Signed)
Patient called back and will pick up meds from [facility], in the future please send all meds to [facility].

## 2018-06-27 ENCOUNTER — Telehealth: Payer: Self-pay | Admitting: *Deleted

## 2018-06-27 NOTE — Telephone Encounter (Signed)
Patient called stating he spoke with [employer] they will cover his injection but Dr Neldon Mc has to approve. Please call patient

## 2018-06-27 NOTE — Telephone Encounter (Signed)
Patient called checking on a medication pixis. Please advise

## 2018-06-27 NOTE — Telephone Encounter (Signed)
Please advise.  If I am sending this to you in error, please let me know!  Thanks so much!

## 2018-06-28 NOTE — Telephone Encounter (Signed)
Called patient and advised Ins. Approval for Dupixent.  Explained process of submit to specialty pharmacy [facility] and if copay unaffordable to reach back out to me and there maybe alt source [facility] to get Rx

## 2018-06-29 ENCOUNTER — Telehealth: Payer: Self-pay | Admitting: Allergy and Immunology

## 2018-06-29 NOTE — Telephone Encounter (Signed)
Pt called and cancelled appointment and would like for you to cancel the rx for Dupixent.

## 2018-06-29 NOTE — Telephone Encounter (Signed)
FYI

## 2018-07-06 ENCOUNTER — Encounter: Payer: Self-pay | Admitting: *Deleted

## 2018-07-19 ENCOUNTER — Ambulatory Visit: Payer: Medicare Other | Admitting: Allergy and Immunology

## 2019-06-15 ENCOUNTER — Other Ambulatory Visit: Payer: Self-pay | Admitting: Dermatology

## 2019-06-15 DIAGNOSIS — L03115 Cellulitis of right lower limb: Secondary | ICD-10-CM

## 2019-06-16 ENCOUNTER — Ambulatory Visit
Admission: RE | Admit: 2019-06-16 | Discharge: 2019-06-16 | Disposition: A | Discharge status: Home / Self Care | Payer: Medicare Other | Source: Ambulatory Visit | Attending: Dermatology | Admitting: Dermatology

## 2019-06-16 ENCOUNTER — Other Ambulatory Visit: Payer: Self-pay

## 2019-06-16 DIAGNOSIS — L03115 Cellulitis of right lower limb: Secondary | ICD-10-CM

## 2019-06-16 MED ORDER — GADOBENATE DIMEGLUMINE 529 MG/ML IV SOLN
20.0000 mL | Freq: Once | INTRAVENOUS | Status: AC | PRN
  Administered 2019-06-16: 20 mL via INTRAVENOUS

## 2019-06-21 ENCOUNTER — Telehealth: Payer: Self-pay

## 2019-06-21 NOTE — Telephone Encounter (Signed)
COVID-19 Pre-Screening Questions:06/21/19   Do you currently have a fever (>100 F), chills or unexplained body aches? NO  Are you currently experiencing new cough, shortness of breath, sore throat, runny nose?NO  .  Have you recently travelled outside the state of [place] in the last 14 days? NO  .  Have you been in contact with someone that is currently pending confirmation of Covid19 testing or has been confirmed to have the [place] virus?  NO   **If the patient answers NO to ALL questions -  advise the patient to please call the clinic before coming to the office should any symptoms develop.

## 2019-06-22 ENCOUNTER — Ambulatory Visit: Payer: Medicare PPO | Admitting: Infectious Diseases

## 2019-06-22 ENCOUNTER — Other Ambulatory Visit: Payer: Self-pay

## 2019-06-22 ENCOUNTER — Telehealth: Payer: Self-pay | Admitting: *Deleted

## 2019-06-22 ENCOUNTER — Encounter: Payer: Self-pay | Admitting: Infectious Diseases

## 2019-06-22 DIAGNOSIS — M86071 Acute hematogenous osteomyelitis, right ankle and foot: Secondary | ICD-10-CM | POA: Diagnosis not present

## 2019-06-22 DIAGNOSIS — M869 Osteomyelitis, unspecified: Secondary | ICD-10-CM | POA: Insufficient documentation

## 2019-06-22 NOTE — Progress Notes (Addendum)
   Subjective:    Patient ID: [name], male    DOB: [birth year], 86 y.o.   MRN: [id number]  HPI 86 yo M with hx of bullous phemigod currently managed with methotrexate. This was reduced last fall. He then developed a blister on his R third toe as well as area on his foot pad. He believes these became infected.  He had MRI that showed (06-16-19): 1. Cellulitis of the third toe with early osteomyelitis of the third distal phalanx. No drainable fluid collection.  He has been seen by Ortho (Dr Alfonso Ramus), had plain film that was (-). He was started on kelfex and has been on it for ~ 12 days.   The past medical history, family history and social history were reviewed/updated in EPIC   Review of Systems  Constitutional: Negative for appetite change, chills, fever and unexpected weight change.  Cardiovascular: Positive for leg swelling.  Gastrointestinal: Negative for constipation and diarrhea.  Genitourinary: Negative for difficulty urinating.  Skin: Positive for wound.  Neurological: Positive for numbness.       Objective:   Physical Exam Constitutional:      Appearance: He is obese.  HENT:     Mouth/Throat:     Mouth: Mucous membranes are moist.     Pharynx: No oropharyngeal exudate.  Eyes:     Extraocular Movements: Extraocular movements intact.     Pupils: Pupils are equal, round, and reactive to light.  Cardiovascular:     Rate and Rhythm: Normal rate and regular rhythm.  Pulmonary:     Effort: Pulmonary effort is normal.     Breath sounds: Normal breath sounds.  Abdominal:     General: Bowel sounds are normal. There is no distension.     Palpations: Abdomen is soft.     Tenderness: There is no abdominal tenderness.  Musculoskeletal:        General: Normal range of motion.     Cervical back: Normal range of motion and neck supple.       Feet:  Neurological:     Mental Status: He is alert.            Assessment & Plan:

## 2019-06-22 NOTE — Assessment & Plan Note (Addendum)
We discussed several options- he could stay on keflex, he could get long acting rx (dalbavancin) or he could go onto contiuous IV.  Will give him rx every 10-14 days. Will check his ESR, CRP, CBC, CMP.  Will get him oritavncin Will stop keflex.  Will see him back in 1 month.  

## 2019-06-22 NOTE — Telephone Encounter (Signed)
RN set patient up for IV dalbavancin infusion appointments at [facility]. Orders signed and faxed to [facility] at [phone]. Patient aware of appointments, location, contact information.  Patient will continue oral cephalexin through Monday 2/8 per Dr [name]. 1st dose dalbavancin 1500 mg once 2/9 12:00 2nd dose dalbavancin 500 mg 2/16 12:00 3rd dose dalbavancin 500 mg 2/23 12:00 4th dose dalbavancin 500 mg 3/2 12:00  Patient to follow up with Dr [name] in clinic after last dose.  Landis Gandy, RN

## 2019-06-23 LAB — CBC
HCT: 35.2 % — ABNORMAL LOW (ref 38.5–50.0)
Hemoglobin: 12.2 g/dL — ABNORMAL LOW (ref 13.2–17.1)
MCH: 35.6 pg — ABNORMAL HIGH (ref 27.0–33.0)
MCHC: 34.7 g/dL (ref 32.0–36.0)
MCV: 102.6 fL — ABNORMAL HIGH (ref 80.0–100.0)
MPV: 9 fL (ref 7.5–12.5)
Platelets: 186 10*3/uL (ref 140–400)
RBC: 3.43 10*6/uL — ABNORMAL LOW (ref 4.20–5.80)
RDW: 13.3 % (ref 11.0–15.0)
WBC: 5.3 10*3/uL (ref 3.8–10.8)

## 2019-06-23 LAB — COMPREHENSIVE METABOLIC PANEL
AG Ratio: 1.8 (calc) (ref 1.0–2.5)
ALT: 33 U/L (ref 9–46)
AST: 23 U/L (ref 10–35)
Albumin: 4 g/dL (ref 3.6–5.1)
Alkaline phosphatase (APISO): 71 U/L (ref 35–144)
BUN/Creatinine Ratio: 27 (calc) — ABNORMAL HIGH (ref 6–22)
BUN: 26 mg/dL — ABNORMAL HIGH (ref 7–25)
CO2: 29 mmol/L (ref 20–32)
Calcium: 9.1 mg/dL (ref 8.6–10.3)
Chloride: 98 mmol/L (ref 98–110)
Creat: 0.96 mg/dL (ref 0.70–1.11)
Globulin: 2.2 g/dL (calc) (ref 1.9–3.7)
Glucose, Bld: 102 mg/dL — ABNORMAL HIGH (ref 65–99)
Potassium: 4.8 mmol/L (ref 3.5–5.3)
Sodium: 135 mmol/L (ref 135–146)
Total Bilirubin: 0.5 mg/dL (ref 0.2–1.2)
Total Protein: 6.2 g/dL (ref 6.1–8.1)

## 2019-06-23 LAB — C-REACTIVE PROTEIN: CRP: 1.8 mg/L (ref <8.0)

## 2019-06-23 LAB — SEDIMENTATION RATE: Sed Rate: 38 mm/h — ABNORMAL HIGH (ref 0–20)

## 2019-06-26 ENCOUNTER — Other Ambulatory Visit (HOSPITAL_COMMUNITY): Payer: Self-pay

## 2019-06-27 ENCOUNTER — Ambulatory Visit (HOSPITAL_COMMUNITY)
Admission: RE | Admit: 2019-06-27 | Discharge: 2019-06-27 | Disposition: A | Discharge status: Home / Self Care | Payer: Medicare PPO | Source: Ambulatory Visit | Attending: Infectious Diseases | Admitting: Infectious Diseases

## 2019-06-27 ENCOUNTER — Other Ambulatory Visit: Payer: Self-pay

## 2019-06-27 DIAGNOSIS — M86071 Acute hematogenous osteomyelitis, right ankle and foot: Secondary | ICD-10-CM | POA: Diagnosis not present

## 2019-06-27 LAB — COMPREHENSIVE METABOLIC PANEL
ALT: 42 U/L (ref 0–44)
AST: 34 U/L (ref 15–41)
Albumin: 3.9 g/dL (ref 3.5–5.0)
Alkaline Phosphatase: 77 U/L (ref 38–126)
Anion gap: 8 (ref 5–15)
BUN: 19 mg/dL (ref 8–23)
CO2: 28 mmol/L (ref 22–32)
Calcium: 9.4 mg/dL (ref 8.9–10.3)
Chloride: 100 mmol/L (ref 98–111)
Creatinine, Ser: 1.15 mg/dL (ref 0.61–1.24)
GFR calc Af Amer: >60 mL/min (ref >60)
GFR calc non Af Amer: 58 mL/min — ABNORMAL LOW (ref >60)
Glucose, Bld: 118 mg/dL — ABNORMAL HIGH (ref 70–99)
Potassium: 4.7 mmol/L (ref 3.5–5.1)
Sodium: 136 mmol/L (ref 135–145)
Total Bilirubin: 0.8 mg/dL (ref 0.3–1.2)
Total Protein: 7.1 g/dL (ref 6.5–8.1)

## 2019-06-27 LAB — CBC
HCT: 38.4 % — ABNORMAL LOW (ref 39.0–52.0)
Hemoglobin: 13.3 g/dL (ref 13.0–17.0)
MCH: 35.8 pg — ABNORMAL HIGH (ref 26.0–34.0)
MCHC: 34.6 g/dL (ref 30.0–36.0)
MCV: 103.2 fL — ABNORMAL HIGH (ref 80.0–100.0)
Platelets: 213 10*3/uL (ref 150–400)
RBC: 3.72 MIL/uL — ABNORMAL LOW (ref 4.22–5.81)
RDW: 13.8 % (ref 11.5–15.5)
WBC: 7.1 10*3/uL (ref 4.0–10.5)
nRBC: 0 % (ref 0.0–0.2)

## 2019-06-27 LAB — C-REACTIVE PROTEIN: CRP: 1.6 mg/dL — ABNORMAL HIGH (ref <1.0)

## 2019-06-27 LAB — SEDIMENTATION RATE: Sed Rate: 32 mm/hr — ABNORMAL HIGH (ref 0–16)

## 2019-06-27 MED ORDER — DEXTROSE 5 % IV SOLN
1500.0000 mg | Freq: Once | INTRAVENOUS | Status: AC
  Administered 2019-06-27: 1500 mg via INTRAVENOUS
  Filled 2019-06-27: qty 75

## 2019-07-04 ENCOUNTER — Other Ambulatory Visit: Payer: Self-pay

## 2019-07-04 ENCOUNTER — Ambulatory Visit (HOSPITAL_COMMUNITY)
Admission: RE | Admit: 2019-07-04 | Discharge: 2019-07-04 | Disposition: A | Discharge status: Home / Self Care | Payer: Medicare PPO | Source: Ambulatory Visit | Attending: Infectious Diseases | Admitting: Infectious Diseases

## 2019-07-04 DIAGNOSIS — M86071 Acute hematogenous osteomyelitis, right ankle and foot: Secondary | ICD-10-CM | POA: Insufficient documentation

## 2019-07-04 LAB — COMPREHENSIVE METABOLIC PANEL
ALT: 41 U/L (ref 0–44)
AST: 34 U/L (ref 15–41)
Albumin: 3.7 g/dL (ref 3.5–5.0)
Alkaline Phosphatase: 69 U/L (ref 38–126)
Anion gap: 6 (ref 5–15)
BUN: 21 mg/dL (ref 8–23)
CO2: 27 mmol/L (ref 22–32)
Calcium: 9.2 mg/dL (ref 8.9–10.3)
Chloride: 101 mmol/L (ref 98–111)
Creatinine, Ser: 0.96 mg/dL (ref 0.61–1.24)
GFR calc Af Amer: >60 mL/min (ref >60)
GFR calc non Af Amer: >60 mL/min (ref >60)
Glucose, Bld: 114 mg/dL — ABNORMAL HIGH (ref 70–99)
Potassium: 4.7 mmol/L (ref 3.5–5.1)
Sodium: 134 mmol/L — ABNORMAL LOW (ref 135–145)
Total Bilirubin: 0.6 mg/dL (ref 0.3–1.2)
Total Protein: 6.2 g/dL — ABNORMAL LOW (ref 6.5–8.1)

## 2019-07-04 LAB — CBC
HCT: 37.6 % — ABNORMAL LOW (ref 39.0–52.0)
Hemoglobin: 12.6 g/dL — ABNORMAL LOW (ref 13.0–17.0)
MCH: 35.1 pg — ABNORMAL HIGH (ref 26.0–34.0)
MCHC: 33.5 g/dL (ref 30.0–36.0)
MCV: 104.7 fL — ABNORMAL HIGH (ref 80.0–100.0)
Platelets: 209 10*3/uL (ref 150–400)
RBC: 3.59 MIL/uL — ABNORMAL LOW (ref 4.22–5.81)
RDW: 14.1 % (ref 11.5–15.5)
WBC: 6 10*3/uL (ref 4.0–10.5)
nRBC: 0 % (ref 0.0–0.2)

## 2019-07-04 LAB — SEDIMENTATION RATE: Sed Rate: 20 mm/hr — ABNORMAL HIGH (ref 0–16)

## 2019-07-04 LAB — C-REACTIVE PROTEIN: CRP: 1.2 mg/dL — ABNORMAL HIGH (ref <1.0)

## 2019-07-04 MED ORDER — DEXTROSE 5 % IV SOLN
500.0000 mg | Freq: Once | INTRAVENOUS | Status: AC
  Administered 2019-07-04: 12:00:00 500 mg via INTRAVENOUS
  Filled 2019-07-04: qty 25

## 2019-07-04 MED ORDER — DEXTROSE 5 % IV SOLN: 500.0000 mg | Freq: Once | INTRAVENOUS | Status: DC

## 2019-07-11 ENCOUNTER — Other Ambulatory Visit: Payer: Self-pay

## 2019-07-11 ENCOUNTER — Ambulatory Visit (HOSPITAL_COMMUNITY)
Admission: RE | Admit: 2019-07-11 | Discharge: 2019-07-11 | Disposition: A | Discharge status: Home / Self Care | Payer: Medicare PPO | Source: Ambulatory Visit | Attending: Infectious Diseases | Admitting: Infectious Diseases

## 2019-07-11 DIAGNOSIS — M86071 Acute hematogenous osteomyelitis, right ankle and foot: Secondary | ICD-10-CM | POA: Diagnosis present

## 2019-07-11 LAB — CBC
HCT: 37.7 % — ABNORMAL LOW (ref 39.0–52.0)
Hemoglobin: 12.6 g/dL — ABNORMAL LOW (ref 13.0–17.0)
MCH: 35.4 pg — ABNORMAL HIGH (ref 26.0–34.0)
MCHC: 33.4 g/dL (ref 30.0–36.0)
MCV: 105.9 fL — ABNORMAL HIGH (ref 80.0–100.0)
Platelets: 232 10*3/uL (ref 150–400)
RBC: 3.56 MIL/uL — ABNORMAL LOW (ref 4.22–5.81)
RDW: 14 % (ref 11.5–15.5)
WBC: 6 10*3/uL (ref 4.0–10.5)
nRBC: 0 % (ref 0.0–0.2)

## 2019-07-11 LAB — COMPREHENSIVE METABOLIC PANEL
ALT: 36 U/L (ref 0–44)
AST: 29 U/L (ref 15–41)
Albumin: 3.8 g/dL (ref 3.5–5.0)
Alkaline Phosphatase: 68 U/L (ref 38–126)
Anion gap: 7 (ref 5–15)
BUN: 23 mg/dL (ref 8–23)
CO2: 27 mmol/L (ref 22–32)
Calcium: 9.2 mg/dL (ref 8.9–10.3)
Chloride: 100 mmol/L (ref 98–111)
Creatinine, Ser: 0.99 mg/dL (ref 0.61–1.24)
GFR calc Af Amer: >60 mL/min (ref >60)
GFR calc non Af Amer: >60 mL/min (ref >60)
Glucose, Bld: 131 mg/dL — ABNORMAL HIGH (ref 70–99)
Potassium: 4.4 mmol/L (ref 3.5–5.1)
Sodium: 134 mmol/L — ABNORMAL LOW (ref 135–145)
Total Bilirubin: 0.8 mg/dL (ref 0.3–1.2)
Total Protein: 6.5 g/dL (ref 6.5–8.1)

## 2019-07-11 LAB — C-REACTIVE PROTEIN: CRP: 1.3 mg/dL — ABNORMAL HIGH (ref <1.0)

## 2019-07-11 LAB — SEDIMENTATION RATE: Sed Rate: 19 mm/hr — ABNORMAL HIGH (ref 0–16)

## 2019-07-11 MED ORDER — DEXTROSE 5 % IV SOLN
500.0000 mg | Freq: Once | INTRAVENOUS | Status: DC
  Administered 2019-07-11: 12:00:00 500 mg via INTRAVENOUS
  Filled 2019-07-11: qty 25

## 2019-07-18 ENCOUNTER — Telehealth: Payer: Self-pay | Admitting: *Deleted

## 2019-07-18 ENCOUNTER — Ambulatory Visit (HOSPITAL_COMMUNITY)
Admission: RE | Admit: 2019-07-18 | Discharge: 2019-07-18 | Disposition: A | Discharge status: Home / Self Care | Payer: Medicare PPO | Source: Ambulatory Visit | Attending: Infectious Diseases | Admitting: Infectious Diseases

## 2019-07-18 ENCOUNTER — Other Ambulatory Visit: Payer: Self-pay

## 2019-07-18 DIAGNOSIS — M86071 Acute hematogenous osteomyelitis, right ankle and foot: Secondary | ICD-10-CM | POA: Insufficient documentation

## 2019-07-18 LAB — COMPREHENSIVE METABOLIC PANEL
ALT: 38 U/L (ref 0–44)
AST: 56 U/L — ABNORMAL HIGH (ref 15–41)
Albumin: 3.7 g/dL (ref 3.5–5.0)
Alkaline Phosphatase: 64 U/L (ref 38–126)
Anion gap: 7 (ref 5–15)
BUN: 20 mg/dL (ref 8–23)
CO2: 27 mmol/L (ref 22–32)
Calcium: 9 mg/dL (ref 8.9–10.3)
Chloride: 99 mmol/L (ref 98–111)
Creatinine, Ser: 0.9 mg/dL (ref 0.61–1.24)
GFR calc Af Amer: >60 mL/min (ref >60)
GFR calc non Af Amer: >60 mL/min (ref >60)
Glucose, Bld: 114 mg/dL — ABNORMAL HIGH (ref 70–99)
Potassium: 5.7 mmol/L — ABNORMAL HIGH (ref 3.5–5.1)
Sodium: 133 mmol/L — ABNORMAL LOW (ref 135–145)
Total Bilirubin: 1.2 mg/dL (ref 0.3–1.2)
Total Protein: 6.3 g/dL — ABNORMAL LOW (ref 6.5–8.1)

## 2019-07-18 LAB — CBC
HCT: 38.7 % — ABNORMAL LOW (ref 39.0–52.0)
Hemoglobin: 12.9 g/dL — ABNORMAL LOW (ref 13.0–17.0)
MCH: 35.4 pg — ABNORMAL HIGH (ref 26.0–34.0)
MCHC: 33.3 g/dL (ref 30.0–36.0)
MCV: 106.3 fL — ABNORMAL HIGH (ref 80.0–100.0)
Platelets: 239 10*3/uL (ref 150–400)
RBC: 3.64 MIL/uL — ABNORMAL LOW (ref 4.22–5.81)
RDW: 13.7 % (ref 11.5–15.5)
WBC: 5.5 10*3/uL (ref 4.0–10.5)
nRBC: 0 % (ref 0.0–0.2)

## 2019-07-18 LAB — SEDIMENTATION RATE: Sed Rate: 19 mm/hr — ABNORMAL HIGH (ref 0–16)

## 2019-07-18 LAB — C-REACTIVE PROTEIN: CRP: <0.5 mg/dL (ref <1.0)

## 2019-07-18 MED ORDER — DEXTROSE 5 % IV SOLN
500.0000 mg | Freq: Once | INTRAVENOUS | Status: AC
  Administered 2019-07-18: 500 mg via INTRAVENOUS
  Filled 2019-07-18: qty 25

## 2019-07-18 NOTE — Telephone Encounter (Signed)
RN relayed results, advice to patient. He denies any feelings of lightheadedness, palpitations, irregular heartbeat or flutters, nausea/vomiting/diarrhea, muscle cramps or weakness.  He is going to Dr Ronnald Ramp on 3/4, usually has lab work drawn at this visit. He asked Dr [name] if it would be ok to wait until then for lab recheck.  Per Dr [name], ok, as long as patient will call 911 if he develops any of these symptoms. He agreed, asked that Dr Ronnald Ramp be made aware.  Will 'cc him on this note, labs. Landis Gandy, RN

## 2019-07-18 NOTE — Telephone Encounter (Signed)
[name] at Dr Ronnald Ramp' office calling for lab results, states Dr Ronnald Ramp is ok with the plan.

## 2019-07-18 NOTE — Telephone Encounter (Signed)
-----   Message from Campbell Riches, MD sent at 07/18/2019  2:42 PM EST ----- Needs repeat K+ asap ----- Message ----- From: Buel Ream, Lab [facility] [place] Sent: 07/18/2019  12:58 PM EST To: Campbell Riches, MD

## 2019-07-18 NOTE — Telephone Encounter (Signed)
Anderson Malta at Dr Ronnald Ramp' office called back, states that Dr Ronnald Ramp will not be in the office on Friday to review labs drawn Thursday, advised it would be better/safer to have labs drawn elsewhere.  RN spoke with patient. He will come to [facility] 3/3 for repeat BMP.  Landis Gandy, RN

## 2019-07-19 ENCOUNTER — Other Ambulatory Visit: Payer: Medicare PPO

## 2019-07-19 ENCOUNTER — Other Ambulatory Visit: Payer: Self-pay

## 2019-07-19 ENCOUNTER — Other Ambulatory Visit: Payer: Self-pay | Admitting: *Deleted

## 2019-07-19 DIAGNOSIS — E875 Hyperkalemia: Secondary | ICD-10-CM

## 2019-07-19 LAB — COMPREHENSIVE METABOLIC PANEL
AG Ratio: 2 (calc) (ref 1.0–2.5)
ALT: 28 U/L (ref 9–46)
AST: 22 U/L (ref 10–35)
Albumin: 4.1 g/dL (ref 3.6–5.1)
Alkaline phosphatase (APISO): 70 U/L (ref 35–144)
BUN: 19 mg/dL (ref 7–25)
CO2: 28 mmol/L (ref 20–32)
Calcium: 9.3 mg/dL (ref 8.6–10.3)
Chloride: 100 mmol/L (ref 98–110)
Creat: 0.85 mg/dL (ref 0.70–1.11)
Globulin: 2.1 g/dL (calc) (ref 1.9–3.7)
Glucose, Bld: 112 mg/dL — ABNORMAL HIGH (ref 65–99)
Potassium: 4.5 mmol/L (ref 3.5–5.3)
Sodium: 135 mmol/L (ref 135–146)
Total Bilirubin: 0.7 mg/dL (ref 0.2–1.2)
Total Protein: 6.2 g/dL (ref 6.1–8.1)

## 2019-07-19 NOTE — Telephone Encounter (Signed)
Thanks

## 2019-07-19 NOTE — Progress Notes (Signed)
Labs per Dr [name]

## 2019-07-20 ENCOUNTER — Ambulatory Visit: Payer: Medicare PPO | Admitting: Infectious Diseases

## 2019-07-25 ENCOUNTER — Ambulatory Visit: Payer: Medicare PPO | Admitting: Infectious Diseases

## 2019-07-25 ENCOUNTER — Encounter: Payer: Self-pay | Admitting: Infectious Diseases

## 2019-07-25 ENCOUNTER — Other Ambulatory Visit: Payer: Self-pay

## 2019-07-25 DIAGNOSIS — R600 Localized edema: Secondary | ICD-10-CM

## 2019-07-25 DIAGNOSIS — L02611 Cutaneous abscess of right foot: Secondary | ICD-10-CM | POA: Diagnosis not present

## 2019-07-25 DIAGNOSIS — L03031 Cellulitis of right toe: Secondary | ICD-10-CM

## 2019-07-25 MED ORDER — LEVOFLOXACIN 500 MG PO TABS: 500.0000 mg | ORAL_TABLET | Freq: Every day | ORAL | 1 refills | Status: DC

## 2019-07-25 MED ORDER — DOXYCYCLINE HYCLATE 100 MG PO TABS: 100.0000 mg | ORAL_TABLET | Freq: Two times a day (BID) | ORAL | 1 refills | Status: DC

## 2019-07-25 NOTE — Assessment & Plan Note (Signed)
MRI was negative for osteo.  We discussed his progress or lack there of.  Will give him cipro/doxy (as opposed to IV anbx via [facility]). We discussed these options.  Will have him seen by wound care center.  Will see him back in 3 weeks.

## 2019-07-25 NOTE — Addendum Note (Signed)
Addended by: Landis Gandy on: 07/25/2019 03:30 PM   Modules accepted: Orders

## 2019-07-25 NOTE — Assessment & Plan Note (Signed)
Needs better control of this Will f/u with PCP.

## 2019-07-25 NOTE — Progress Notes (Signed)
   Subjective:    Patient ID: [name], male    DOB: 11-01-1932, 86 y.o.   MRN: [id number]  HPI 86 yo M with hx of bullous phemigod currently managed with methotrexate. This was reduced last fall. He then developed a blister on his R third toe as well as area on his foot pad. He believes these became infected.  He had MRI that showed (06-16-19): 1. Cellulitis of the third toe with early osteomyelitis of the third distal phalanx. No drainable fluid collection.  He has been seen by Ortho (Dr Alfonso Ramus), had plain film that was (-).   He was seen in [place] on 2-4 and was given 4 doses of oritavancin, 1 week apart each.   He feels that his foot is better.  Continues to have drainage. Has been using triple anbx cream on his foot. Washing with soapy water.  Continues to have sig edema. Can keep his foot elevated.  No fever or chills.    Review of Systems  Constitutional: Negative for chills and fever.  Cardiovascular: Positive for leg swelling.  Skin: Positive for wound.  Please see HPI. All other systems reviewed and negative.      Objective:   Physical Exam Constitutional:      Appearance: Normal appearance.  Musculoskeletal:     Right lower leg: Edema present.     Left lower leg: Edema present.  Neurological:     General: No focal deficit present.     Mental Status: He is alert and oriented to person, place, and time.  Psychiatric:        Mood and Affect: Mood normal.         Assessment & Plan:

## 2019-08-02 ENCOUNTER — Encounter (HOSPITAL_BASED_OUTPATIENT_CLINIC_OR_DEPARTMENT_OTHER): Payer: Medicare PPO | Attending: Physician Assistant | Admitting: Physician Assistant

## 2019-08-02 ENCOUNTER — Other Ambulatory Visit: Payer: Self-pay

## 2019-08-02 DIAGNOSIS — M4807 Spinal stenosis, lumbosacral region: Secondary | ICD-10-CM | POA: Diagnosis not present

## 2019-08-02 DIAGNOSIS — I10 Essential (primary) hypertension: Secondary | ICD-10-CM | POA: Diagnosis not present

## 2019-08-02 DIAGNOSIS — G9009 Other idiopathic peripheral autonomic neuropathy: Secondary | ICD-10-CM | POA: Insufficient documentation

## 2019-08-02 DIAGNOSIS — J45909 Unspecified asthma, uncomplicated: Secondary | ICD-10-CM | POA: Insufficient documentation

## 2019-08-02 DIAGNOSIS — L120 Bullous pemphigoid: Secondary | ICD-10-CM | POA: Diagnosis not present

## 2019-08-02 DIAGNOSIS — M1990 Unspecified osteoarthritis, unspecified site: Secondary | ICD-10-CM | POA: Insufficient documentation

## 2019-08-02 DIAGNOSIS — L97512 Non-pressure chronic ulcer of other part of right foot with fat layer exposed: Secondary | ICD-10-CM | POA: Insufficient documentation

## 2019-08-07 NOTE — Progress Notes (Signed)
[name] ([id number]) Visit Report for 08/02/2019 Abuse/Suicide Risk Screen Details Patient Name: Date of Service: [name], [name] 08/02/2019 2:45 PM Medical Record [id number] Patient Account Number: [id number] Date of Birth/Sex: Treating RN: [birth year] (86 y.o. Janyth Contes Primary Care Eryk Beavers: Patrina Levering Other Clinician: Referring Marketia Stallsmith: Treating Sherlie Boyum/Extender:[name], [name], [name] Weeks in Treatment: 0 Abuse/Suicide Risk Screen Items Answer ABUSE RISK SCREEN: Has anyone close to you tried to hurt or harm you recentlyo No Do you feel uncomfortable with anyone in your familyo No Has anyone forced you do things that you didnt want to doo No Electronic Signature(s) Signed: 08/07/2019 5:18:21 PM By: Levan Hurst RN, BSN Entered By: Levan Hurst on 08/02/2019 15:13:28 -------------------------------------------------------------------------------- Activities of Daily Living Details Patient Name: Date of Service: [name] 08/02/2019 2:45 PM Medical Record [id number] Patient Account Number: [id number] Date of Birth/Sex: Treating RN: 05-15-1933 (86 y.o. Janyth Contes Primary Care Dean Wonder: Patrina Levering Other Clinician: Referring Mayzee Reichenbach: Treating Hadlea Furuya/Extender:[name], [name], [name] Weeks in Treatment: 0 Activities of Daily Living Items Answer Activities of Daily Living (Please select one for each item) Drive Automobile Completely Able Take Medications Completely Able Use Telephone Completely Able Care for Appearance Completely Able Use Toilet Completely Able Bath / Shower Completely Able Dress Self Completely Able Feed Self Completely Able Walk Completely Able Get In / Out Bed Completely Able Housework Completely Able Prepare Meals Completely [place] for Self Completely Able Electronic Signature(s) Signed: 08/07/2019 5:18:21 PM By: Levan Hurst RN,  BSN Entered By: Levan Hurst on 08/02/2019 15:13:47 -------------------------------------------------------------------------------- Education Screening Details Patient Name: Date of Service: [name] 08/02/2019 2:45 PM Medical Record [id number] Patient Account Number: [id number] Date of Birth/Sex: Treating RN: 1932-11-22 (86 y.o. Janyth Contes Primary Care Vidal Lampkins: Patrina Levering Other Clinician: Referring Edd Reppert: Treating Zakariya Knickerbocker/Extender:[name], [name], [name] Weeks in Treatment: 0 Primary Learner Assessed: Patient Learning Preferences/Education Level/Primary Language Learning Preference: Explanation, Demonstration Highest Education Level: College or Above Preferred Language: English Cognitive Barrier Language Barrier: No Translator Needed: No Memory Deficit: No Emotional Barrier: No Cultural/Religious Beliefs Affecting Medical Care: No Physical Barrier Impaired Vision: No Impaired Hearing: No Decreased Hand dexterity: No Knowledge/Comprehension Knowledge Level: High Comprehension Level: High Ability to understand written High instructions: Ability to understand verbal High instructions: Motivation Anxiety Level: Calm Cooperation: Cooperative Education Importance: Acknowledges Need Interest in Health Problems: Asks Questions Perception: Coherent Willingness to Engage in Self- High Management Activities: Readiness to Engage in Self- High Management Activities: Electronic Signature(s) Signed: 08/07/2019 5:18:21 PM By: Levan Hurst RN, BSN Entered By: Levan Hurst on 08/02/2019 15:14:04 -------------------------------------------------------------------------------- Fall Risk Assessment Details Patient Name: Date of Service: [name] 08/02/2019 2:45 PM Medical Record [id number] Patient Account Number: [id number] Date of Birth/Sex: Treating RN: [birth year] (86 y.o. Janyth Contes Primary Care  Drenda Sobecki: [name] Other Clinician: Referring Alysse Rathe: Treating Gurley Climer/Extender:[name], [name], [name] Weeks in Treatment: 0 Fall Risk Assessment Items Have you had 2 or more falls in the last 12 monthso 0 No Have you had any fall that resulted in injury in the last 12 monthso 0 No FALLS RISK SCREEN History of falling - immediate or within 3 months 0 No Secondary diagnosis (Do you have 2 or more medical diagnoseso) 15 Yes Ambulatory aid None/bed rest/wheelchair/nurse 0 No Crutches/cane/walker 15 Yes Furniture 0 No Intravenous therapy Access/Saline/Heparin Lock 0 No Weak (short steps with or without shuffle, stooped but able to lift head  0 No while walking, may seek support from furniture) Impaired (short steps with shuffle, may have difficulty arising from chair, 0 No head down, impaired balance) Mental Status Oriented to own ability 0 Yes Overestimates or forgets limitations 0 No Risk Level: Medium Risk Score: 30 Electronic Signature(s) Signed: 08/07/2019 5:18:21 PM By: Levan Hurst RN, BSN Entered By: Levan Hurst on 08/02/2019 15:14:14 -------------------------------------------------------------------------------- Foot Assessment Details Patient Name: Date of Service: [name] 08/02/2019 2:45 PM Medical Record [id number] Patient Account Number: [id number] Date of Birth/Sex: Treating RN: 1932/06/29 (86 y.o. Janyth Contes Primary Care Vaughn Frieze: [name] Other Clinician: Referring Teagyn Fishel: Treating Aniesa Boback/Extender:[name], [name], [name] Weeks in Treatment: 0 Foot Assessment Items Site Locations + = Sensation present, - = Sensation absent, C = Callus, U = Ulcer R = Redness, W = Warmth, M = Maceration, PU = Pre-ulcerative lesion F = Fissure, S = Swelling, D = Dryness Assessment Right: Left: Other Deformity: No No Prior Foot Ulcer: No No Prior Amputation: No No Charcot Joint: No No Ambulatory Status:  Ambulatory With Help Assistance Device: Walker GaitEnergy manager) Signed: 08/07/2019 5:18:21 PM By: Levan Hurst RN, BSN Entered By: Levan Hurst on 08/02/2019 15:16:10 -------------------------------------------------------------------------------- Nutrition Risk Screening Details Patient Name: Date of Service: [name] 08/02/2019 2:45 PM Medical Record [id number] Patient Account Number: [id number] Date of Birth/Sex: Treating RN: Oct 18, 1932 (86 y.o. Janyth Contes Primary Care Tinika Bucknam: [name] Other Clinician: Referring Kristin Barcus: Treating Toluwanimi Radebaugh/Extender:[name], [name], [name] Weeks in Treatment: 0 Height (in): 71 Weight (lbs): 283 Body Mass Index (BMI): 39.5 Nutrition Risk Screening Items Score Screening NUTRITION RISK SCREEN: I have an illness or condition that made me change the kind and/or 0 No amount of food I eat I eat fewer than two meals per day 0 No I eat few fruits and vegetables, or milk products 0 No I have three or more drinks of beer, liquor or wine almost every day 0 No I have tooth or mouth problems that make it hard for me to eat 0 No I don't always have enough money to buy the food I need 0 No I eat alone most of the time 0 No I take three or more different prescribed or over-the-counter drugs a day 1 Yes 0 No Without wanting to, I have lost or gained 10 pounds in the last six months I am not always physically able to shop, cook and/or feed myself 0 No Nutrition Protocols Good Risk Protocol Provide education on Moderate Risk Protocol 0 nutrition High Risk Proctocol Risk Level: Good Risk Score: 1 Electronic Signature(s) Signed: 08/07/2019 5:18:21 PM By: Levan Hurst RN, BSN Entered By: Levan Hurst on 08/02/2019 15:14:22

## 2019-08-07 NOTE — Progress Notes (Signed)
[name] ([id number]) Visit Report for 08/02/2019 Allergy List Details Patient Name: Date of Service: [name] 08/02/2019 2:45 PM Medical Record [id number] Patient Account Number: [id number] Date of Birth/Sex: Treating RN: March 14, 1933 (86 y.o. Janyth Contes Primary Care Clayten Allcock: Patrina Levering Other Clinician: Referring Dorthie Santini: Treating Jameel Quant/Extender:[name], [name], [name] Weeks in Treatment: 0 Allergies Active Allergies spironolactone Reaction: nausea Severity: Moderate hydrocodone Reaction: nausea/vomiting Allergy Notes Electronic Signature(s) Signed: 08/07/2019 5:18:21 PM By: Levan Hurst RN, BSN Entered By: Levan Hurst on 08/02/2019 15:01:19 -------------------------------------------------------------------------------- Arrival Information Details Patient Name: Date of Service: [name] 08/02/2019 2:45 PM Medical Record [id number] Patient Account Number: [id number] Date of Birth/Sex: Treating RN: 09/25/1932 (86 y.o. Janyth Contes Primary Care Lillyann Ahart: Patrina Levering Other Clinician: Referring Johntavius Shepard: Treating Janaysha Depaulo/Extender:[name], [name], [name] Weeks in Treatment: 0 Visit Information Patient Arrived: Walker Arrival Time: 14:55 Accompanied By: alone Transfer Assistance: None Patient Identification Verified: Yes Secondary Verification Process Completed: Yes Patient Requires Transmission-Based No Precautions: Patient Has Alerts: No Electronic Signature(s) Signed: 08/07/2019 5:18:21 PM By: Levan Hurst RN, BSN Entered By: Levan Hurst on 08/02/2019 14:56:42 -------------------------------------------------------------------------------- Clinic Level of Care Assessment Details Patient Name: Date of Service: [name] 08/02/2019 2:45 PM Medical Record [id number] Patient Account Number: [id number] Date of Birth/Sex: Treating RN: March 08, 1933 (86 y.o. [name] Primary Care Kasen Adduci: [name] Other Clinician: Referring Damiya Sandefur: Treating Keyante Durio/Extender:[name], [name], [name] Weeks in Treatment: 0 Clinic Level of Care Assessment Items TOOL 1 Quantity Score []  - Use when EandM and Procedure is performed on INITIAL visit 0 ASSESSMENTS - Nursing Assessment / Reassessment X - General Physical Exam (combine w/ comprehensive assessment (listed just below) 1 20 when performed on new pt. evals) X - Comprehensive Assessment (HX, ROS, Risk Assessments, Wounds Hx, etc.) 1 25 ASSESSMENTS - Wound and Skin Assessment / Reassessment []  - Dermatologic / Skin Assessment (not related to wound area) 0 ASSESSMENTS - Ostomy and/or Continence Assessment and Care []  - Incontinence Assessment and Management 0 []  - Ostomy Care Assessment and Management (repouching, etc.) 0 PROCESS - Coordination of Care X - Simple Patient / Family Education for ongoing care 1 15 []  - Complex (extensive) Patient / Family Education for ongoing care 0 X - Staff obtains Programmer, systems, Records, Test Results / Process Orders 1 10 []  - Staff telephones HHA, Nursing Homes / Clarify orders / etc 0 []  - Routine Transfer to another Facility (non-emergent condition) 0 []  - Routine Hospital Admission (non-emergent condition) 0 X - New Admissions / Biomedical engineer / Ordering NPWT, Apligraf, etc. 1 15 []  - Emergency Hospital Admission (emergent condition) 0 PROCESS - Special Needs []  - Pediatric / Minor Patient Management 0 []  - Isolation Patient Management 0 []  - Hearing / Language / Visual special needs 0 []  - Assessment of Community assistance (transportation, D/C planning, etc.) 0 []  - Additional assistance / Altered mentation 0 []  - Support Surface(s) Assessment (bed, cushion, seat, etc.) 0 INTERVENTIONS - Miscellaneous []  - External ear exam 0 []  - Patient Transfer (multiple staff / Civil Service fast streamer / Similar devices) 0 []  - Simple Staple / Suture  removal (25 or less) 0 []  - Complex Staple / Suture removal (26 or more) 0 []  - Hypo/Hyperglycemic Management (do not check if billed separately) 0 X - Ankle / Brachial Index (ABI) - do not check if billed separately 1 15 Has the patient been seen at the hospital within the last three years: Yes Total  Score: 100 Level Of Care: New/Established - Level 3 Electronic Signature(s) Signed: 08/02/2019 4:42:28 PM By: Baruch Gouty RN, BSN Entered By: Baruch Gouty on 08/02/2019 16:04:53 -------------------------------------------------------------------------------- Lower Extremity Assessment Details Patient Name: Date of Service: [name] 08/02/2019 2:45 PM Medical Record [id number] Patient Account Number: [id number] Date of Birth/Sex: Treating RN: 1932-08-31 (86 y.o. Janyth Contes Primary Care Mayola Mcbain: [name] Other Clinician: Referring Jarl Sellitto: Treating Tinika Bucknam/Extender:[name], [name], [name] Weeks in Treatment: 0 Edema Assessment Assessed: [Left: No] [Right: No] Edema: [Left: No] [Right: Yes] Calf Left: Right: Point of Measurement: 32 cm From Medial Instep cm 49 cm Ankle Left: Right: Point of Measurement: 12 cm From Medial Instep cm 31.2 cm Vascular Assessment Pulses: Dorsalis Pedis Palpable: [Right:Yes] Blood Pressure: Brachial: [Right:140] Ankle: [Right:Dorsalis Pedis: 180 1.29] Electronic Signature(s) Signed: 08/07/2019 5:18:21 PM By: Levan Hurst RN, BSN Entered By: Levan Hurst on 08/02/2019 15:22:00 -------------------------------------------------------------------------------- Multi-Disciplinary Care Plan Details Patient Name: Date of Service: [name] 08/02/2019 2:45 PM Medical Record [id number] Patient Account Number: [id number] Date of Birth/Sex: Treating RN: Mar 11, 1933 (86 y.o. [name] Primary Care Kinya Meine: [name] Other Clinician: Referring Rieley Khalsa: Treating  Epiphany Seltzer/Extender:[name], [name], [name] Weeks in Treatment: 0 Active Inactive Abuse / Safety / Falls / Self Care Management Nursing Diagnoses: Potential for falls Goals: Patient/caregiver will verbalize/demonstrate measures taken to prevent injury and/or falls Date Initiated: 08/02/2019 Target Resolution Date: 08/30/2019 Goal Status: Active Interventions: Assess fall risk on admission and as needed Assess impairment of mobility on admission and as needed per policy Notes: Osteomyelitis Nursing Diagnoses: Infection: osteomyelitis Knowledge deficit related to disease process and management Goals: Patient/caregiver will verbalize understanding of disease process and disease management Date Initiated: 08/02/2019 Target Resolution Date: 08/30/2019 Goal Status: Active Patient's osteomyelitis will resolve Date Initiated: 08/02/2019 Target Resolution Date: 08/30/2019 Goal Status: Active Interventions: Assess for signs and symptoms of osteomyelitis resolution every visit Provide education on osteomyelitis Treatment Activities: Systemic antibiotics : 08/02/2019 Notes: Wound/Skin Impairment Nursing Diagnoses: Impaired tissue integrity Knowledge deficit related to ulceration/compromised skin integrity Goals: Patient/caregiver will verbalize understanding of skin care regimen Date Initiated: 08/02/2019 Target Resolution Date: 08/30/2019 Goal Status: Active Ulcer/skin breakdown will have a volume reduction of 30% by week 4 Date Initiated: 08/02/2019 Target Resolution Date: 08/30/2019 Goal Status: Active Interventions: Assess patient/caregiver ability to obtain necessary supplies Assess patient/caregiver ability to perform ulcer/skin care regimen upon admission and as needed Assess ulceration(s) every visit Provide education on ulcer and skin care Treatment Activities: Skin care regimen initiated : 08/02/2019 Topical wound management initiated : 08/02/2019 Notes: Electronic  Signature(s) Signed: 08/02/2019 4:42:28 PM By: Baruch Gouty RN, BSN Entered By: Baruch Gouty on 08/02/2019 16:02:46 -------------------------------------------------------------------------------- Pain Assessment Details Patient Name: Date of Service: [name] 08/02/2019 2:45 PM Medical Record [id number] Patient Account Number: [id number] Date of Birth/Sex: Treating RN: 1933/03/30 (86 y.o. Janyth Contes Primary Care Janasia Coverdale: Other Clinician: Patrina Levering Referring Kaela Beitz: Treating Ahsha Hinsley/Extender:[name], [name], [name] Weeks in Treatment: 0 Active Problems Location of Pain Severity and Description of Pain Patient Has Paino No Site Locations Pain Management and Medication Current Pain Management: Electronic Signature(s) Signed: 08/07/2019 5:18:21 PM By: Levan Hurst RN, BSN Entered By: Levan Hurst on 08/02/2019 15:19:00 -------------------------------------------------------------------------------- Patient/Caregiver Education Details Patient Name: Date of Service: [name] 3/17/2021andnbsp2:45 PM Medical Record [id number] Patient Account Number: [id number] Date of Birth/Gender: Treating RN: 08/10/1932 (86 y.o. [name] Primary Care Physician: [name] Other Clinician: Referring Physician: Treating Physician/Extender:Stone  III, [name], [name] Weeks in Treatment: 0 Education Assessment Education Provided To: Patient Education Topics Provided Infection: Methods: Explain/Verbal Responses: Reinforcements needed, State content correctly Welcome To [facility]: Handouts: Welcome To [facility] [place] Methods: Explain/Verbal, Printed Responses: Reinforcements needed, State content correctly Wound Debridement: Handouts: Wound Debridement Methods: Explain/Verbal, Printed Responses: Reinforcements needed, State content correctly Wound/Skin Impairment: Handouts: Caring for  Your Ulcer, Skin Care Do's and Dont's Methods: Explain/Verbal, Printed Responses: Reinforcements needed, State content correctly Electronic Signature(s) Signed: 08/02/2019 4:42:28 PM By: Baruch Gouty RN, BSN Entered By: Baruch Gouty on 08/02/2019 16:03:42 -------------------------------------------------------------------------------- Wound Assessment Details Patient Name: Date of Service: [name] 08/02/2019 2:45 PM Medical Record [id number] Patient Account Number: [id number] Date of Birth/Sex: Treating RN: 05-04-1933 (86 y.o. Janyth Contes Primary Care Tahj Lindseth: [name] Other Clinician: Referring Tamantha Saline: Treating Cortavious Nix/Extender:[name], [name], [name] Weeks in Treatment: 0 Wound Status Wound Number: 1 Primary Neuropathic Ulcer-Non Diabetic Etiology: Wound Location: Right Toe Third Wound Open Wounding Event: Blister Status: Date Acquired: 06/02/2019 Comorbid Asthma, Hypertension, Osteoarthritis, Weeks Of Treatment: 0 History: Osteomyelitis, Neuropathy Clustered Wound: No Photos Photo Uploaded By: Mikeal Hawthorne on 08/03/2019 10:03:08 Wound Measurements Length: (cm) 1.5 % Reductio Width: (cm) 2.5 % Reductio Depth: (cm) 0.1 Epithelial Area: (cm) 2.945 Tunneling Volume: (cm) 0.295 Undermi Wound Description Classification: Full Thickness Without Exposed Support Foul Od Structures Slough/ Wound Flat and Intact Margin: Exudate Medium Amount: Exudate Serosanguineous Type: Exudate red, brown Color: Wound Bed Granulation Amount: Large (67-100%) Granulation Quality: Pink, Hyper-granulation Fascia E Necrotic Amount: None Present (0%) Fat Laye Tendon E Muscle E Joint Ex Bone Exp or After Cleansing: No Fibrino No Exposed Structure xposed: No r (Subcutaneous Tissue) Exposed: Yes xposed: No xposed: No posed: No osed: No n in Area: 0% n in Volume: 0% ization: None : No ning: No Treatment Notes Wound #1 (Right  Toe Third) 1. Cleanse With Wound Cleanser Soap and water 3. Primary Dressing Applied Hydrofera Blue 4. Secondary Dressing Dry Gauze 5. Secured With Recruitment consultant) Signed: 08/07/2019 5:18:21 PM By: Levan Hurst RN, BSN Entered By: Levan Hurst on 08/02/2019 16:00:41 -------------------------------------------------------------------------------- [place] Details Patient Name: Date of Service: [name] 08/02/2019 2:45 PM Medical Record [id number] Patient Account Number: [id number] Date of Birth/Sex: Treating RN: 04/13/1933 (86 y.o. Janyth Contes Primary Care Saladin Petrelli: [name] Other Clinician: Referring Mithra Spano: Treating Gurney Balthazor/Extender:[name], [name], [name] Weeks in Treatment: 0 Vital Signs Time Taken: 15:00 Temperature (F): 97.7 Height (in): 71 Pulse (bpm): 74 Source: Stated Respiratory Rate (breaths/min): 18 Weight (lbs): 283 Blood Pressure (mmHg): 140/53 Source: Stated Reference Range: 80 - 120 mg / dl Body Mass Index (BMI): 39.5 Electronic Signature(s) Signed: 08/07/2019 5:18:21 PM By: Levan Hurst RN, BSN Entered By: Levan Hurst on 08/02/2019 15:00:56

## 2019-08-07 NOTE — Progress Notes (Signed)
[name] ([id number]) Visit Report for 08/02/2019 Chief Complaint Document Details Patient Name: Date of Service: [name], [name] 08/02/2019 2:45 PM Medical Record [id number] Patient Account Number: [id number] Date of Birth/Sex: Treating RN: 08/19/1932 (86 y.o. [name] Primary Care Provider: [name] Other Clinician: Referring Provider: Treating Provider/Extender:[name], [name], [name] Weeks in Treatment: 0 Information Obtained from: Patient Chief Complaint Right 3rd toe ulcer Electronic Signature(s) Signed: 08/02/2019 3:57:21 PM By: Worthy Keeler PA-C Entered By: Worthy Keeler on 08/02/2019 15:57:21 -------------------------------------------------------------------------------- Debridement Details Patient Name: Date of Service: [name] 08/02/2019 2:45 PM Medical Record [id number] Patient Account Number: [id number] Date of Birth/Sex: Treating RN: 31-Aug-1932 (86 y.o. [name] Primary Care Provider: [name] Other Clinician: Referring Provider: Treating Provider/Extender:[name], [name], [name] Weeks in Treatment: 0 Debridement Performed for Wound #1 Right Toe Third Assessment: Performed By: Physician Worthy Keeler, PA Debridement Type: Debridement Level of Consciousness (Pre- Awake and Alert procedure): Pre-procedure Verification/Time Out Taken: Yes - 16:00 Start Time: 16:05 Pain Control: Lidocaine 4% Topical Solution Total Area Debrided (L x W): 1.5 (cm) x 2.5 (cm) = 3.75 (cm) Tissue and other material Viable, Non-Viable, Callus, Subcutaneous, Skin: Epidermis debrided: Level: Skin/Subcutaneous Tissue Debridement Description: Excisional Instrument: Curette Bleeding: Minimum Hemostasis Achieved: Silver Nitrate End Time: 16:11 Procedural Pain: 0 Post Procedural Pain: 0 Response to Treatment: Procedure was tolerated well Level of Consciousness Awake and  Alert (Post-procedure): Post Debridement Measurements of Total Wound Length: (cm) 1.5 Width: (cm) 2.5 Depth: (cm) 0.1 Volume: (cm) 0.295 Character of Wound/Ulcer Post Improved Debridement: Post Procedure Diagnosis Same as Pre-procedure Electronic Signature(s) Signed: 08/02/2019 4:26:16 PM By: Worthy Keeler PA-C Signed: 08/02/2019 4:42:28 PM By: Baruch Gouty RN, BSN Entered By: Baruch Gouty on 08/02/2019 16:10:54 -------------------------------------------------------------------------------- HPI Details Patient Name: Date of Service: [name] 08/02/2019 2:45 PM Medical Record [id number] Patient Account Number: [id number] Date of Birth/Sex: Treating RN: 05-31-1932 (86 y.o. [name] Primary Care Provider: [name] Other Clinician: Referring Provider: Treating Provider/Extender:[name], [name], [name] Weeks in Treatment: 0 History of Present Illness HPI Description: 08/02/2019 upon evaluation today patient appears for initial evaluation here in our clinic concerning issues he has been having with his right third toe. He has a wound here that has been given him trouble for several weeks, months. He does have nondiabetic idiopathic peripheral neuropathy, bullous pemphigoid, hypertension, and spinal stenosis which is actually the cause of his neuropathy. Unfortunately he did have signs of osteomyelitis noted as well on his MRI which showed that he had cellulitis of the third toe with early osteomyelitis this was performed on 06/16/2019. With that being said he has been on IV Dalvance weekly infusions for 4 weeks and then subsequently was transitioned to Levaquin/doxycycline which she is currently on for the next month. He has been [name] who did refer him to [place]. Currently Xeroform is been used over the wound site at this point. He is also on methotrexate for the bullous pemphigoid which he has had for  years. He does see dermatology for this. He also has an appoint with Dr. [name] next week as well. Currently the patient states that things have been seeming to get better he was surprised about how long he had to be on antibiotics however. No fevers, chills, nausea, vomiting, or diarrhea. This patient is potentially a hyperbaric oxygen therapy candidate depending on how things progress. With that being said  I do think appropriate wound care may go a long way of trying to get things doing better to be perfectly honest. Electronic Signature(s) Signed: 08/02/2019 4:23:52 PM By: Worthy Keeler PA-C Entered By: Worthy Keeler on 08/02/2019 16:23:51 -------------------------------------------------------------------------------- Physical Exam Details Patient Name: Date of Service: [name], [name] 08/02/2019 2:45 PM Medical Record [id number] Patient Account Number: [id number] Date of Birth/Sex: Treating RN: 1932/07/03 (86 y.o. [name] Primary Care Provider: [name] Other Clinician: Referring Provider: Treating Provider/Extender:[name], [name], [name] Weeks in Treatment: 0 Constitutional patient is hypertensive.. pulse regular and within target range for patient.Marland Kitchen respirations regular, non-labored and within target range for patient.Marland Kitchen temperature within target range for patient.. Well-nourished and well-hydrated in no acute distress. Eyes conjunctiva clear no eyelid edema noted. pupils equal round and reactive to light and accommodation. Ears, Nose, Mouth, and Throat no gross abnormality of ear auricles or external auditory canals. normal hearing noted during conversation. mucus membranes moist. Respiratory normal breathing without difficulty. Cardiovascular 2+ dorsalis pedis/posterior tibialis pulses. no clubbing, cyanosis, significant edema, <3 sec cap refill. Musculoskeletal normal gait and posture. no significant deformity or arthritic changes,  no loss or range of motion, no clubbing. Psychiatric this patient is able to make decisions and demonstrates good insight into disease process. Alert and Oriented x 3. pleasant and cooperative. Notes Upon inspection today patient's wound bed actually showed signs of good granulation in some areas with epithelization around the edges of the wound. With that being said he does have some somewhat poor hyper granular tissue which is overgrown the end of his toe and is preventing good healing as well in my opinion. We can have to debride this away which I discussed with the patient the basis behind why we would do this. Obviously he in the end did agree to this we proceeded with debridement I was able to clear away the viable but poor quality hyper granular tissue that has not been a support new skin growth over the end of the toe. I did have to use silver nitrate to chemically cauterize the area in order to prevent this from continuing to bleed he tolerated all of this without complication he had no pain secondary to the neuropathy. Electronic Signature(s) Signed: 08/02/2019 4:24:47 PM By: Worthy Keeler PA-C Entered By: Worthy Keeler on 08/02/2019 16:24:46 -------------------------------------------------------------------------------- Physician Orders Details Patient Name: Date of Service: [name] 08/02/2019 2:45 PM Medical Record [id number] Patient Account Number: [id number] Date of Birth/Sex: Treating RN: 1933/04/15 (86 y.o. [name] Primary Care Provider: [name] Other Clinician: Referring Provider: Treating Provider/Extender:[name], [name], [name] Weeks in Treatment: 0 Verbal / Phone Orders: No Diagnosis Coding ICD-10 Coding Code Description G90.09 Other idiopathic peripheral autonomic neuropathy L97.512 Non-pressure chronic ulcer of other part of right foot with fat layer exposed L12.0 Bullous pemphigoid I10 Essential (primary)  hypertension M48.07 Spinal stenosis, lumbosacral region Follow-up Appointments Return Appointment in 1 week. Dressing Change Frequency Wound #1 Right Toe Third Change Dressing every other day. Wound Cleansing Wound #1 Right Toe Third May shower and wash wound with soap and water. Primary Wound Dressing Wound #1 Right Toe Third Hydrofera Blue Secondary Dressing Wound #1 Right Toe Third Kerlix/Rolled Gauze Dry Gauze Electronic Signature(s) Signed: 08/02/2019 4:26:16 PM By: Worthy Keeler PA-C Signed: 08/02/2019 4:42:28 PM By: Baruch Gouty RN, BSN Entered By: Baruch Gouty on 08/02/2019 16:08:47 -------------------------------------------------------------------------------- Problem List Details Patient Name: Date of Service: [name]. 08/02/2019 2:45  PM Medical Record [id number] Patient Account Number: [id number] Date of Birth/Sex: Treating RN: 1932-05-21 (86 y.o. [name] Primary Care Provider: [name] Other Clinician: Referring Provider: Treating Provider/Extender:[name], [name], [name] Weeks in Treatment: 0 Active Problems ICD-10 Evaluated Encounter Code Description Active Date Today Diagnosis G90.09 Other idiopathic peripheral autonomic neuropathy 08/02/2019 No Yes L97.512 Non-pressure chronic ulcer of other part of right foot 08/02/2019 No Yes with fat layer exposed L12.0 Bullous pemphigoid 08/02/2019 No Yes I10 Essential (primary) hypertension 08/02/2019 No Yes M48.07 Spinal stenosis, lumbosacral region 08/02/2019 No Yes Inactive Problems Resolved Problems Electronic Signature(s) Signed: 08/02/2019 3:57:02 PM By: Worthy Keeler PA-C Entered By: Worthy Keeler on 08/02/2019 15:57:02 -------------------------------------------------------------------------------- Progress Note Details Patient Name: Date of Service: [name] 08/02/2019 2:45 PM Medical Record [id number] Patient Account Number:  [id number] Date of Birth/Sex: Treating RN: [birth year] (86 y.o. [name] Primary Care Provider: [name] Other Clinician: Referring Provider: Treating Provider/Extender:[name], [name], [name] Weeks in Treatment: 0 Subjective Chief Complaint Information obtained from Patient Right 3rd toe ulcer History of Present Illness (HPI) 08/02/2019 upon evaluation today patient appears for initial evaluation here in our clinic concerning issues he has been having with his right third toe. He has a wound here that has been given him trouble for several weeks, months. He does have nondiabetic idiopathic peripheral neuropathy, bullous pemphigoid, hypertension, and spinal stenosis which is actually the cause of his neuropathy. Unfortunately he did have signs of osteomyelitis noted as well on his MRI which showed that he had cellulitis of the third toe with early osteomyelitis this was performed on 06/16/2019. With that being said he has been on IV Dalvance weekly infusions for 4 weeks and then subsequently was transitioned to Levaquin/doxycycline which she is currently on for the next month. He has been seeing [name] at [facility] who did refer him to [place]. Currently Xeroform is been used over the wound site at this point. He is also on methotrexate for the bullous pemphigoid which he has had for years. He does see dermatology for this. He also has an appoint with Dr. [name] next week as well. Currently the patient states that things have been seeming to get better he was surprised about how long he had to be on antibiotics however. No fevers, chills, nausea, vomiting, or diarrhea. This patient is potentially a hyperbaric oxygen therapy candidate depending on how things progress. With that being said I do think appropriate wound care may go a long way of trying to get things doing better to be perfectly honest. Patient History Information obtained from  Patient. Allergies spironolactone (Severity: Moderate, Reaction: nausea), hydrocodone (Reaction: nausea/vomiting) Family History Hypertension - Paternal Grandparents,Mother. Social History Former smoker - quit in 1982, Marital Status - Single, Alcohol Use - Rarely, Drug Use - No History, Caffeine Use - Rarely. Medical History Respiratory Patient has history of Asthma Cardiovascular Patient has history of Hypertension Musculoskeletal Patient has history of Osteoarthritis, Osteomyelitis - right 3rd toe Neurologic Patient has history of Neuropathy Medical And Surgical History Notes Genitourinary BPH Integumentary (Skin) Bullous Pemphigoid Neurologic Spinal stenosis Review of Systems (ROS) Constitutional Symptoms (General Health) Denies complaints or symptoms of Fatigue, Fever, Chills, Marked Weight Change. Eyes Complains or has symptoms of Glasses / Contacts - glasses. Ear/Nose/Mouth/Throat Denies complaints or symptoms of Chronic sinus problems or rhinitis. Gastrointestinal Denies complaints or symptoms of Frequent diarrhea, Nausea, Vomiting. Endocrine Denies complaints or symptoms of Heat/cold intolerance. Genitourinary Denies complaints  or symptoms of Frequent urination. Integumentary (Skin) Complains or has symptoms of Wounds - wound on right 3rd toe. Psychiatric Denies complaints or symptoms of Claustrophobia, Suicidal. Objective Constitutional patient is hypertensive.. pulse regular and within target range for patient.Marland Kitchen respirations regular, non-labored and within target range for patient.Marland Kitchen temperature within target range for patient.. Well-nourished and well-hydrated in no acute distress. Vitals Time Taken: 3:00 PM, Height: 71 in, Source: Stated, Weight: 283 lbs, Source: Stated, BMI: 39.5, Temperature: 97.7 F, Pulse: 74 bpm, Respiratory Rate: 18 breaths/min, Blood Pressure: 140/53 mmHg. Eyes conjunctiva clear no eyelid edema noted. pupils equal round and  reactive to light and accommodation. Ears, Nose, Mouth, and Throat no gross abnormality of ear auricles or external auditory canals. normal hearing noted during conversation. mucus membranes moist. Respiratory normal breathing without difficulty. Cardiovascular 2+ dorsalis pedis/posterior tibialis pulses. no clubbing, cyanosis, significant edema, Musculoskeletal normal gait and posture. no significant deformity or arthritic changes, no loss or range of motion, no clubbing. Psychiatric this patient is able to make decisions and demonstrates good insight into disease process. Alert and Oriented x 3. pleasant and cooperative. General Notes: Upon inspection today patient's wound bed actually showed signs of good granulation in some areas with epithelization around the edges of the wound. With that being said he does have some somewhat poor hyper granular tissue which is overgrown the end of his toe and is preventing good healing as well in my opinion. We can have to debride this away which I discussed with the patient the basis behind why we would do this. Obviously he in the end did agree to this we proceeded with debridement I was able to clear away the viable but poor quality hyper granular tissue that has not been a support new skin growth over the end of the toe. I did have to use silver nitrate to chemically cauterize the area in order to prevent this from continuing to bleed he tolerated all of this without complication he had no pain secondary to the neuropathy. Integumentary (Hair, Skin) Wound #1 status is Open. Original cause of wound was Blister. The wound is located on the Right Toe Third. The wound measures 1.5cm length x 2.5cm width x 0.1cm depth; 2.945cm^2 area and 0.295cm^3 volume. There is Fat Layer (Subcutaneous Tissue) Exposed exposed. There is no tunneling or undermining noted. There is a medium amount of serosanguineous drainage noted. The wound margin is flat and intact.  There is large (67-100%) pink, hyper - granulation within the wound bed. There is no necrotic tissue within the wound bed. Assessment Active Problems ICD-10 Other idiopathic peripheral autonomic neuropathy Non-pressure chronic ulcer of other part of right foot with fat layer exposed Bullous pemphigoid Essential (primary) hypertension Spinal stenosis, lumbosacral region Procedures Wound #1 Pre-procedure diagnosis of Wound #1 is a Neuropathic Ulcer-Non Diabetic located on the Right Toe Third . There was a Excisional Skin/Subcutaneous Tissue Debridement with a total area of 3.75 sq cm performed by Worthy Keeler, PA. With the following instrument(s): Curette to remove Viable and Non-Viable tissue/material. Material removed includes Callus, Subcutaneous Tissue, and Skin: Epidermis after achieving pain control using Lidocaine 4% Topical Solution. No specimens were taken. A time out was conducted at 16:00, prior to the start of the procedure. A Minimum amount of bleeding was controlled with Silver Nitrate. The procedure was tolerated well with a pain level of 0 throughout and a pain level of 0 following the procedure. Post Debridement Measurements: 1.5cm length x 2.5cm width x 0.1cm depth; 0.295cm^3  volume. Character of Wound/Ulcer Post Debridement is improved. Post procedure Diagnosis Wound #1: Same as Pre-Procedure Plan Follow-up Appointments: Return Appointment in 1 week. Dressing Change Frequency: Wound #1 Right Toe Third: Change Dressing every other day. Wound Cleansing: Wound #1 Right Toe Third: May shower and wash wound with soap and water. Primary Wound Dressing: Wound #1 Right Toe Third: Hydrofera Blue Secondary Dressing: Wound #1 Right Toe Third: Kerlix/Rolled Gauze Dry Gauze 1. My suggestion at this time is good to be that we go ahead and initiate treatment with a Hydrofera Blue dressing which I think is probably can be the best way to go at this time. 2. I am also can  recommend that the patient clean the area with mild soap and water before each dressing change. 3. I am also going to suggest that he continue to monitor for any signs of worsening infection although I think continuing with the Levaquin and doxycycline orally is the appropriate thing to do based on everything I am seeing at this point. We will see patient back for reevaluation in 1 week here in the clinic. If anything worsens or changes patient will contact our office for additional recommendations. Electronic Signature(s) Signed: 08/02/2019 4:25:35 PM By: Worthy Keeler PA-C Entered By: Worthy Keeler on 08/02/2019 16:25:34 -------------------------------------------------------------------------------- HxROS Details Patient Name: Date of Service: [name] 08/02/2019 2:45 PM Medical Record [id number] Patient Account Number: [id number] Date of Birth/Sex: Treating RN: 09-01-1932 (86 y.o. Janyth Contes Primary Care Provider: Patrina Levering Other Clinician: Referring Provider: Treating Provider/Extender:[name], [name], [name] Weeks in Treatment: 0 Information Obtained From Patient Constitutional Symptoms (General Health) Complaints and Symptoms: Negative for: Fatigue; Fever; Chills; Marked Weight Change Eyes Complaints and Symptoms: Positive for: Glasses / Contacts - glasses Ear/Nose/Mouth/Throat Complaints and Symptoms: Negative for: Chronic sinus problems or rhinitis Gastrointestinal Complaints and Symptoms: Negative for: Frequent diarrhea; Nausea; Vomiting Endocrine Complaints and Symptoms: Negative for: Heat/cold intolerance Genitourinary Complaints and Symptoms: Negative for: Frequent urination Medical History: Past Medical History Notes: BPH Integumentary (Skin) Complaints and Symptoms: Positive for: Wounds - wound on right 3rd toe Medical History: Past Medical History Notes: Bullous Pemphigoid Psychiatric Complaints and  Symptoms: Negative for: Claustrophobia; Suicidal Hematologic/Lymphatic Respiratory Medical History: Positive for: Asthma Cardiovascular Medical History: Positive for: Hypertension Immunological Musculoskeletal Medical History: Positive for: Osteoarthritis; Osteomyelitis - right 3rd toe Neurologic Medical History: Positive for: Neuropathy Past Medical History Notes: Spinal stenosis Oncologic Immunizations Pneumococcal Vaccine: Received Pneumococcal Vaccination: Yes Implantable Devices None Family and Social History Hypertension: Yes - Paternal Grandparents,Mother; Former smoker - quit in 1982; Marital Status - Single; Alcohol Use: Rarely; Drug Use: No History; Caffeine Use: Rarely; Financial Concerns: No; Food, Clothing or Shelter Needs: No; Support System Lacking: No; Transportation Concerns: No Electronic Signature(s) Signed: 08/02/2019 4:26:16 PM By: Worthy Keeler PA-C Signed: 08/07/2019 5:18:21 PM By: Levan Hurst RN, BSN Entered By: Levan Hurst on 08/02/2019 15:30:39 -------------------------------------------------------------------------------- [place] Details Patient Name: Date of Service: [name] 08/02/2019 Medical Record [id number] Patient Account Number: [id number] Date of Birth/Sex: Treating RN: [birth year] (86 y.o. [name] Primary Care Provider: [name] Other Clinician: Referring Provider: Treating Provider/Extender:[name], [name], [name] Weeks in Treatment: 0 Diagnosis Coding ICD-10 Codes Code Description G90.09 Other idiopathic peripheral autonomic neuropathy L97.512 Non-pressure chronic ulcer of other part of right foot with fat layer exposed L12.0 Bullous pemphigoid I10 Essential (primary) hypertension M48.07 Spinal stenosis, lumbosacral region Facility Procedures CPT4 Code Description: [id number] - WOUND CARE VISIT-LEV  3 EST PT Modifier: 25 Quantity: 1 CPT4 Code Description: [id number]  [id number] - DEB SUBQ TISSUE 20 SQ CM/< ICD-10 Diagnosis Description L97.512 Non-pressure chronic ulcer of other part of right foot with Modifier: fat layer expo Quantity: 1 sed Physician Procedures CPT4 Code Description: [id number] - WC PHYS LEVEL 4 - NEW PT ICD-10 Diagnosis Description G90.09 Other idiopathic peripheral autonomic neuropathy L97.512 Non-pressure chronic ulcer of other part of right foot w L12.0 Bullous pemphigoid I10 Essential  (primary) hypertension Modifier: 25 ith fat layer e Quantity: 1 xposed CPT4 Code Description: [id number] - WC PHYS SUBQ TISS 20 SQ CM ICD-10 Diagnosis Description L97.512 Non-pressure chronic ulcer of other part of right foot with Modifier: fat layer expo Quantity: 1 sed Electronic Signature(s) Signed: 08/02/2019 4:25:52 PM By: Worthy Keeler PA-C Entered By: Worthy Keeler on 08/02/2019 16:25:52

## 2019-08-09 ENCOUNTER — Other Ambulatory Visit: Payer: Self-pay

## 2019-08-09 ENCOUNTER — Encounter (HOSPITAL_BASED_OUTPATIENT_CLINIC_OR_DEPARTMENT_OTHER): Payer: Medicare PPO | Admitting: Physician Assistant

## 2019-08-09 DIAGNOSIS — L97512 Non-pressure chronic ulcer of other part of right foot with fat layer exposed: Secondary | ICD-10-CM | POA: Diagnosis not present

## 2019-08-09 NOTE — Progress Notes (Signed)
KASEN, RAMMER ([id number]) Visit Report for 08/09/2019 Arrival Information Details Patient Name: Date of Service: [name], [name] 08/09/2019 2:15 PM Medical Record [id number] Patient Account Number: [id number] Date of Birth/Sex: Treating RN: 01-Feb-1933 (86 y.o. Hessie Diener Primary Care Alyssa Mancera: Patrina Levering Other Clinician: Referring Mahrukh Seguin: Treating Cythnia Osmun/Extender:[name], [name], [name] Weeks in Treatment: 1 Visit Information History Since Last Visit Walker Added or deleted any medications: No Patient Arrived: Any new allergies or adverse reactions: No Arrival Time: 14:52 Had a fall or experienced change in No Accompanied By: self activities of daily living that may affect Transfer Assistance: None risk of falls: Patient Identification Verified: Yes Signs or symptoms of abuse/neglect since last No Secondary Verification Process Completed: Yes visito Patient Requires Transmission-Based No Hospitalized since last visit: No Precautions: Implantable device outside of the clinic excluding No Patient Has Alerts: No cellular tissue based products placed in the center since last visit: Has Dressing in Place as Prescribed: Yes Pain Present Now: No Electronic Signature(s) Signed: 08/09/2019 5:22:14 PM By: Deon Pilling Entered By: Deon Pilling on 08/09/2019 14:52:23 -------------------------------------------------------------------------------- Encounter Discharge Information Details Patient Name: Date of Service: [name] 08/09/2019 2:15 PM Medical Record [id number] Patient Account Number: [id number] Date of Birth/Sex: Treating RN: 01/28/1933 (86 y.o. Jerilynn Mages) Carlene Coria Primary Care Eduarda Scrivens: Patrina Levering Other Clinician: Referring Jep Dyas: Treating Sayeed Weatherall/Extender:[name], [name], [name] Weeks in Treatment: 1 Encounter Discharge Information Items Post Procedure Vitals Discharge Condition:  Stable Temperature (F): 97.5 Ambulatory Status: Walker Pulse (bpm): 65 Discharge Destination: Home Respiratory Rate (breaths/min): 20 Transportation: Private Auto Blood Pressure (mmHg): 163/60 Accompanied By: self Schedule Follow-up Appointment: Yes Clinical Summary of Care: Patient Declined Electronic Signature(s) Signed: 08/09/2019 5:15:50 PM By: Carlene Coria RN Entered By: Carlene Coria on 08/09/2019 16:02:15 -------------------------------------------------------------------------------- Lower Extremity Assessment Details Patient Name: Date of Service: [name], [name] 08/09/2019 2:15 PM Medical Record [id number] Patient Account Number: [id number] Date of Birth/Sex: Treating RN: [birth year] (86 y.o. Hessie Diener Primary Care Viki Carrera: Patrina Levering Other Clinician: Referring Regginald Pask: Treating Kimaria Struthers/Extender:[name], [name], [name] Weeks in Treatment: 1 Edema Assessment Assessed: [Left: No] [Right: Yes] Edema: [Left: No] [Right: Yes] Calf Left: Right: Point of Measurement: 32 cm From Medial Instep cm 49 cm Ankle Left: Right: Point of Measurement: 12 cm From Medial Instep cm 32 cm Electronic Signature(s) Signed: 08/09/2019 5:22:14 PM By: Deon Pilling Entered By: Deon Pilling on 08/09/2019 14:56:48 -------------------------------------------------------------------------------- Multi-Disciplinary Care Plan Details Patient Name: Date of Service: [name] 08/09/2019 2:15 PM Medical Record [id number] Patient Account Number: [id number] Date of Birth/Sex: Treating RN: 20-Jan-1933 (86 y.o. [name] Primary Care Azaylia Fong: [name] Other Clinician: Referring Akeela Busk: Treating Charity Tessier/Extender:[name], [name], [name] Weeks in Treatment: 1 Active Inactive Abuse / Safety / Falls / Self Care Management Nursing Diagnoses: Potential for falls Goals: Patient/caregiver will verbalize/demonstrate measures taken  to prevent injury and/or falls Date Initiated: 08/02/2019 Target Resolution Date: 08/30/2019 Goal Status: Active Interventions: Assess fall risk on admission and as needed Assess impairment of mobility on admission and as needed per policy Notes: Osteomyelitis Nursing Diagnoses: Infection: osteomyelitis Knowledge deficit related to disease process and management Goals: Patient/caregiver will verbalize understanding of disease process and disease management Date Initiated: 08/02/2019 Target Resolution Date: 08/30/2019 Goal Status: Active Patient's osteomyelitis will resolve Date Initiated: 08/02/2019 Target Resolution Date: 08/30/2019 Goal Status: Active Interventions: Assess for signs and symptoms of osteomyelitis resolution every visit Provide education on osteomyelitis Treatment Activities: Systemic antibiotics :  08/02/2019 Notes: Wound/Skin Impairment Nursing Diagnoses: Impaired tissue integrity Knowledge deficit related to ulceration/compromised skin integrity Goals: Patient/caregiver will verbalize understanding of skin care regimen Date Initiated: 08/02/2019 Target Resolution Date: 08/30/2019 Goal Status: Active Ulcer/skin breakdown will have a volume reduction of 30% by week 4 Date Initiated: 08/02/2019 Target Resolution Date: 08/30/2019 Goal Status: Active Interventions: Assess patient/caregiver ability to obtain necessary supplies Assess patient/caregiver ability to perform ulcer/skin care regimen upon admission and as needed Assess ulceration(s) every visit Provide education on ulcer and skin care Treatment Activities: Skin care regimen initiated : 08/02/2019 Topical wound management initiated : 08/02/2019 Notes: Electronic Signature(s) Signed: 08/09/2019 5:36:50 PM By: Baruch Gouty RN, BSN Entered By: Baruch Gouty on 08/09/2019 15:41:34 -------------------------------------------------------------------------------- Pain Assessment Details Patient Name: Date  of Service: [name] 08/09/2019 2:15 PM Medical Record [id number] Patient Account Number: [id number] Date of Birth/Sex: Treating RN: [birth year] (86 y.o. Hessie Diener Primary Care Didier Brandenburg: Patrina Levering Other Clinician: Referring Kenson Groh: Treating Patryck Kilgore/Extender:[name], [name], [name] Weeks in Treatment: 1 Active Problems Location of Pain Severity and Description of Pain Patient Has Paino No Site Locations Rate the pain. Current Pain Level: 0 Pain Management and Medication Current Pain Management: Medication: No Cold Application: No Rest: No Massage: No Activity: No T.E.N.S.: No Heat Application: No Leg drop or elevation: No Is the Current Pain Management Adequate: Inadequate How does your wound impact your activities of daily livingo Sleep: No Bathing: No Appetite: No Relationship With Others: No Bladder Continence: No Emotions: No Bowel Continence: No Work: No Toileting: No Drive: No Dressing: No Hobbies: No Electronic Signature(s) Signed: 08/09/2019 5:22:14 PM By: Deon Pilling Entered By: Deon Pilling on 08/09/2019 14:53:56 -------------------------------------------------------------------------------- Patient/Caregiver Education Details Patient Name: Date of Service: [name] 3/24/2021andnbsp2:15 PM Medical Record [id number] Patient Account Number: [id number] Date of Birth/Gender: Treating RN: 01/28/1933 (86 y.o. [name] Primary Care Physician: [name] Other Clinician: Referring Physician: Treating Physician/Extender:[name], [name], [name] Weeks in Treatment: 1 Education Assessment Education Provided To: Patient Education Topics Provided Infection: Methods: Explain/Verbal Responses: Reinforcements needed, State content correctly Wound/Skin Impairment: Methods: Explain/Verbal Responses: Reinforcements needed, State content correctly Electronic Signature(s) Signed:  08/09/2019 5:36:50 PM By: Baruch Gouty RN, BSN Entered By: Baruch Gouty on 08/09/2019 15:41:53 -------------------------------------------------------------------------------- Wound Assessment Details Patient Name: Date of Service: [name] 08/09/2019 2:15 PM Medical Record [id number] Patient Account Number: [id number] Date of Birth/Sex: Treating RN: [birth year] (86 y.o. Hessie Diener Primary Care Lonzy Mato: [name] Other Clinician: Referring Siddhi Dornbush: Treating Kaseem Vastine/Extender:[name], [name], [name] Weeks in Treatment: 1 Wound Status Wound Number: 1 Primary Neuropathic Ulcer-Non Diabetic Etiology: Wound Location: Right Toe Third Wound Open Wounding Event: Blister Status: Date Acquired: 06/02/2019 Comorbid Asthma, Hypertension, Osteoarthritis, Weeks Of Treatment: 1 History: Osteomyelitis, Neuropathy Clustered Wound: No Wound Measurements Length: (cm) 0.1 % Reduct Width: (cm) 1 % Reduct Depth: (cm) 0.1 Epitheli Area: (cm) 0.079 Tunneli Volume: (cm) 0.008 Undermi Wound Description Full Thickness Without Exposed Support Foul Od Classification: Structures Slough/ Wound Flat and Intact Margin: Exudate Medium Amount: Exudate Serosanguineous Type: Exudate red, brown Color: Wound Bed Granulation Amount: Large (67-100%) Granulation Quality: Pink, Hyper-granulation Fascia E Necrotic Amount: None Present (0%) Fat Laye Tendon E Muscle E Joint Ex Bone Exp or After Cleansing: No Fibrino No Exposed Structure xposed: No r (Subcutaneous Tissue) Exposed: Yes xposed: No xposed: No posed: No osed: No ion in Area: 97.3% ion in Volume: 97.3% alization: Medium (34-66%) ng: No ning: No Treatment Notes Wound #  1 (Right Toe Third) 1. Cleanse With Wound Cleanser Soap and water 3. Primary Dressing Applied Hydrofera Blue 4. Secondary Dressing Dry Gauze 5. Secured With Recruitment consultant) Signed: 08/09/2019 5:22:14 PM  By: Deon Pilling Entered By: Deon Pilling on 08/09/2019 14:57:07 -------------------------------------------------------------------------------- Vitals Details Patient Name: Date of Service: [name] 08/09/2019 2:15 PM Medical Record [id number] Patient Account Number: [id number] Date of Birth/Sex: Treating RN: [birth year] (86 y.o. Hessie Diener Primary Care Kavina Cantave: [name] Other Clinician: Referring Tenzin Pavon: Treating Irean Kendricks/Extender:[name], [name], [name] Weeks in Treatment: 1 Vital Signs Time Taken: 14:53 Temperature (F): 97.5 Height (in): 71 Pulse (bpm): 65 Weight (lbs): 283 Respiratory Rate (breaths/min): 20 Body Mass Index (BMI): 39.5 Blood Pressure (mmHg): 163/60 Reference Range: 80 - 120 mg / dl Electronic Signature(s) Signed: 08/09/2019 5:22:14 PM By: Deon Pilling Entered By: Deon Pilling on 08/09/2019 14:53:32

## 2019-08-10 NOTE — Progress Notes (Addendum)
MAKI, ENSLEN ([id number]) Visit Report for 08/09/2019 Chief Complaint Document Details Patient Name: Date of Service: ALEXANDAR, STOWELL 08/09/2019 2:15 PM Medical Record [id number] Patient Account Number: [id number] Date of Birth/Sex: Treating RN: 12/23/1932 (86 y.o. [name] Primary Care Provider: [name] Other Clinician: Referring Provider: Treating Provider/Extender:[name], [name], [name] Weeks in Treatment: 1 Information Obtained from: Patient Chief Complaint Right 3rd toe ulcer Electronic Signature(s) Signed: 08/09/2019 3:16:21 PM By: Worthy Keeler PA-C Entered By: Worthy Keeler on 08/09/2019 15:16:21 -------------------------------------------------------------------------------- Problem List Details Patient Name: Date of Service: [name] 08/09/2019 2:15 PM Medical Record [id number] Patient Account Number: [id number] Date of Birth/Sex: Treating RN: [birth year] (86 y.o. 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

## 2019-08-16 ENCOUNTER — Encounter (HOSPITAL_BASED_OUTPATIENT_CLINIC_OR_DEPARTMENT_OTHER): Payer: Medicare PPO | Admitting: Physician Assistant

## 2019-08-18 ENCOUNTER — Other Ambulatory Visit: Payer: Self-pay

## 2019-08-18 ENCOUNTER — Encounter (HOSPITAL_BASED_OUTPATIENT_CLINIC_OR_DEPARTMENT_OTHER): Payer: Medicare PPO | Attending: Physician Assistant | Admitting: Physician Assistant

## 2019-08-18 DIAGNOSIS — G9009 Other idiopathic peripheral autonomic neuropathy: Secondary | ICD-10-CM | POA: Diagnosis not present

## 2019-08-18 DIAGNOSIS — I10 Essential (primary) hypertension: Secondary | ICD-10-CM | POA: Diagnosis not present

## 2019-08-18 DIAGNOSIS — L97512 Non-pressure chronic ulcer of other part of right foot with fat layer exposed: Secondary | ICD-10-CM | POA: Diagnosis not present

## 2019-08-18 DIAGNOSIS — L120 Bullous pemphigoid: Secondary | ICD-10-CM | POA: Insufficient documentation

## 2019-08-18 NOTE — Progress Notes (Addendum)
[name] ([id number]) Visit Report for 08/18/2019 Chief Complaint Document Details Patient Name: Date of Service: [name], [name] 08/18/2019 3:00 PM Medical Record [id number] Patient Account Number: [id number] Date of Birth/Sex: Treating RN: March 11, 1933 (86 y.o. Marvis Repress Primary Care Provider: Patrina Levering Other Clinician: Referring Provider: Treating Provider/Extender:[name], [name], [name] Weeks in Treatment: 2 Information Obtained from: Patient Chief Complaint Right 3rd toe ulcer Electronic Signature(s) Signed: 08/18/2019 3:12:50 PM By: Worthy Keeler PA-C Entered By: Worthy Keeler on 08/18/2019 15:12:50 -------------------------------------------------------------------------------- Debridement Details Patient Name: Date of Service: [name] 08/18/2019 3:00 PM Medical Record [id number] Patient Account Number: [id number] Date of Birth/Sex: Treating RN: Mar 14, 1933 (85 y.o. Marvis Repress Primary Care Provider: [name] Other Clinician: Referring Provider: Treating Provider/Extender:[name], [name], [name] Weeks in Treatment: 2 Debridement Performed for Wound #1 Right Toe Third Assessment: Performed By: Physician Worthy Keeler, PA Debridement Type: Debridement Level of Consciousness (Pre- Awake and Alert procedure): Pre-procedure Verification/Time Out Taken: Yes - 16:00 Start Time: 16:00 Pain Control: Other : benzocaine, 20% Total Area Debrided (L x W): 0.1 (cm) x 0.9 (cm) = 0.09 (cm) Tissue and other material Viable, Non-Viable, Callus debrided: Level: Non-Viable Tissue Debridement Description: Selective/Open Wound Instrument: Curette Bleeding: Minimum Hemostasis Achieved: Pressure End Time: 16:01 Procedural Pain: 0 Post Procedural Pain: 0 Response to Treatment: Procedure was tolerated well Level of Consciousness Awake and Alert (Post-procedure): Post Debridement Measurements  of Total Wound Length: (cm) 0.1 Width: (cm) 0.9 Depth: (cm) 0.1 Volume: (cm) 0.007 Character of Wound/Ulcer Post Improved Debridement: Post Procedure Diagnosis Same as Pre-procedure Electronic Signature(s) Signed: 08/18/2019 4:46:14 PM By: Worthy Keeler PA-C Signed: 08/21/2019 4:55:48 PM By: Kela Millin Entered By: Kela Millin on 08/18/2019 16:03:45 -------------------------------------------------------------------------------- HPI Details Patient Name: Date of Service: [name] 08/18/2019 3:00 PM Medical Record [id number] Patient Account Number: [id number] Date of Birth/Sex: Treating RN: [birth year] (86 y.o. Marvis Repress Primary Care Provider: Patrina Levering Other Clinician: Referring Provider: Treating Provider/Extender:[name], [name], [name] Weeks in Treatment: 2 History of Present Illness HPI Description: 08/02/2019 upon evaluation today patient appears for initial evaluation here in our clinic concerning issues he has been having with his right third toe. He has a wound here that has been given him trouble for several weeks, months. He does have nondiabetic idiopathic peripheral neuropathy, bullous pemphigoid, hypertension, and spinal stenosis which is actually the cause of his neuropathy. Unfortunately he did have signs of osteomyelitis noted as well on his MRI which showed that he had cellulitis of the third toe with early osteomyelitis this was performed on 06/16/2019. With that being said he has been on IV Dalvance weekly infusions for 4 weeks and then subsequently was transitioned to Levaquin/doxycycline which she is currently on for the next month. He has [name] who did refer him to [place]. Currently Xeroform is been used over the wound site at this point. He is also on methotrexate for the bullous pemphigoid which he has had for years. He does see dermatology for this. He also has an appoint  with Dr. [name] next week as well. Currently the patient states that things have been seeming to get better he was surprised about how long he had to be on antibiotics however. No fevers, chills, nausea, vomiting, or diarrhea. This patient is potentially a hyperbaric oxygen therapy candidate depending on how things progress. With that being said I do think appropriate wound  care may go a long way of trying to get things doing better to be perfectly honest. 08/09/2019 upon evaluation today patient appears to actually be doing much better as compared to his last visit last week here in the office. He has been tolerating the Hydrofera Blue without complication. He does have a friend that lives down from him who has been coming to help change the dressings for him. That is been of great benefit as well he tells me. Fortunately there is no signs of active infection at this time. He has very little hypergranular tissue compared to last week. 08/18/2019 upon evaluation today patient actually appears to be doing excellent in regard to his toe ulcer. He has been tolerating the dressing changes without complication. Fortunately he is showing no signs of infection and the hyper granular tissue seems to be closing up quite nicely. Overall I'm extremely pleased with the progress. Electronic Signature(s) Signed: 08/18/2019 4:05:40 PM By: Worthy Keeler PA-C Entered By: Worthy Keeler on 08/18/2019 16:05:39 -------------------------------------------------------------------------------- Physical Exam Details Patient Name: Date of Service: [name] 08/18/2019 3:00 PM Medical Record [id number] Patient Account Number: [id number] Date of Birth/Sex: Treating RN: [birth year] (86 y.o. Marvis Repress Primary Care Provider: Patrina Levering Other Clinician: Referring Provider: Treating Provider/Extender:[name], [name], [name] Weeks in Treatment: 2 Constitutional Well-nourished and  well-hydrated in no acute distress. Respiratory normal breathing without difficulty. Psychiatric this patient is able to make decisions and demonstrates good insight into disease process. Alert and Oriented x 3. pleasant and cooperative. Notes Patient's wound bed currently again showed minimal hyper granular tissue no sharp debridement of this was noted or necessary rather. He did have to have some debridement of the callus around the edges which was just drying out but I did this very carefully no viable tissue was removed. Electronic Signature(s) Signed: 08/18/2019 4:06:22 PM By: Worthy Keeler PA-C Entered By: Worthy Keeler on 08/18/2019 16:06:21 -------------------------------------------------------------------------------- Physician Orders Details Patient Name: Date of Service: KWAMANE, RESPICIO 08/18/2019 3:00 PM Medical Record [id number] Patient Account Number: [id number] Date of Birth/Sex: Treating RN: Dec 25, 1932 (86 y.o. Marvis Repress Primary Care Provider: [name] Other Clinician: Referring Provider: Treating Provider/Extender:[name], [name], [name] Weeks in Treatment: 2 Verbal / Phone Orders: No Diagnosis Coding ICD-10 Coding Code Description G90.09 Other idiopathic peripheral autonomic neuropathy L97.512 Non-pressure chronic ulcer of other part of right foot with fat layer exposed L12.0 Bullous pemphigoid I10 Essential (primary) hypertension M48.07 Spinal stenosis, lumbosacral region Follow-up Appointments Return Appointment in 1 week. Dressing Change Frequency Wound #1 Right Toe Third Change Dressing every third day Wound Cleansing Wound #1 Right Toe Third May shower and wash wound with soap and water. - with dressing changes Primary Wound Dressing Wound #1 Right Toe Third Hydrofera Blue Secondary Dressing Wound #1 Right Toe Third Kerlix/Rolled Gauze Dry Gauze Electronic Signature(s) Signed: 08/18/2019 4:46:14 PM By: Worthy Keeler PA-C Signed: 08/21/2019 4:55:48 PM By: Kela Millin Entered By: Kela Millin on 08/18/2019 15:48:58 -------------------------------------------------------------------------------- Problem List Details Patient Name: Date of Service: [name]. 08/18/2019 3:00 PM Medical Record [id number] Patient Account Number: [id number] Date of Birth/Sex: Treating RN: September 07, 1932 (86 y.o. Marvis Repress Primary Care Provider: Patrina Levering Other Clinician: Referring Provider: Treating Provider/Extender:[name], [name], [name] Weeks in Treatment: 2 Active Problems ICD-10 Evaluated Encounter Code Description Active Date Today Diagnosis G90.09 Other idiopathic peripheral autonomic neuropathy 08/02/2019 No Yes L97.512 Non-pressure chronic ulcer of other part  of right foot 08/02/2019 No Yes with fat layer exposed L12.0 Bullous pemphigoid 08/02/2019 No Yes I10 Essential (primary) hypertension 08/02/2019 No Yes M48.07 Spinal stenosis, lumbosacral region 08/02/2019 No Yes Inactive Problems Resolved Problems Electronic Signature(s) Signed: 08/18/2019 4:46:14 PM By: Worthy Keeler PA-C Signed: 08/21/2019 4:55:48 PM By: Kela Millin Previous Signature: 08/18/2019 3:12:45 PM Version By: Worthy Keeler PA-C Entered By: Kela Millin on 08/18/2019 15:48:39 -------------------------------------------------------------------------------- Progress Note Details Patient Name: Date of Service: [name] 08/18/2019 3:00 PM Medical Record [id number] Patient Account Number: [id number] Date of Birth/Sex: Treating RN: Dec 19, 1932 (86 y.o. Marvis Repress Primary Care Provider: Patrina Levering Other Clinician: Referring Provider: Treating Provider/Extender:[name], [name], [name] Weeks in Treatment: 2 Subjective Chief Complaint Information obtained from Patient Right 3rd toe ulcer History of Present Illness (HPI) 08/02/2019 upon  evaluation today patient appears for initial evaluation here in our clinic concerning issues he has been having with his right third toe. He has a wound here that has been given him trouble for several weeks, months. He does have nondiabetic idiopathic peripheral neuropathy, bullous pemphigoid, hypertension, and spinal stenosis which is actually the cause of his neuropathy. Unfortunately he did have signs of osteomyelitis noted as well on his MRI which showed that he had cellulitis of the third toe with early osteomyelitis this was performed on 06/16/2019. With that being said he has been on IV Dalvance weekly infusions for 4 weeks and then subsequently was transitioned to Levaquin/doxycycline which she is currently on for the next month. He has been [name] who did refer him to [place]. Currently Xeroform is been used over the wound site at this point. He is also on methotrexate for the bullous pemphigoid which he has had for years. He does see dermatology for this. He also has an appoint with Dr. [name] next week as well. Currently the patient states that things have been seeming to get better he was surprised about how long he had to be on antibiotics however. No fevers, chills, nausea, vomiting, or diarrhea. This patient is potentially a hyperbaric oxygen therapy candidate depending on how things progress. With that being said I do think appropriate wound care may go a long way of trying to get things doing better to be perfectly honest. 08/09/2019 upon evaluation today patient appears to actually be doing much better as compared to his last visit last week here in the office. He has been tolerating the Hydrofera Blue without complication. He does have a friend that lives down from him who has been coming to help change the dressings for him. That is been of great benefit as well he tells me. Fortunately there is no signs of active infection at this time. He has very  little hypergranular tissue compared to last week. 08/18/2019 upon evaluation today patient actually appears to be doing excellent in regard to his toe ulcer. He has been tolerating the dressing changes without complication. Fortunately he is showing no signs of infection and the hyper granular tissue seems to be closing up quite nicely. Overall I'm extremely pleased with the progress. Objective Constitutional Well-nourished and well-hydrated in no acute distress. Vitals Time Taken: 3:16 PM, Height: 71 in, Weight: 283 lbs, BMI: 39.5, Temperature: 98.3 F, Pulse: 75 bpm, Respiratory Rate: 20 breaths/min, Blood Pressure: 152/69 mmHg. Respiratory normal breathing without difficulty. Psychiatric this patient is able to make decisions and demonstrates good insight into disease process. Alert and Oriented x 3. pleasant and cooperative.  General Notes: Patient's wound bed currently again showed minimal hyper granular tissue no sharp debridement of this was noted or necessary rather. He did have to have some debridement of the callus around the edges which was just drying out but I did this very carefully no viable tissue was removed. Integumentary (Hair, Skin) Wound #1 status is Open. Original cause of wound was Blister. The wound is located on the Right Toe Third. The wound measures 0.1cm length x 0.9cm width x 0.1cm depth; 0.071cm^2 area and 0.007cm^3 volume. There is Fat Layer (Subcutaneous Tissue) Exposed exposed. There is no tunneling or undermining noted. There is a medium amount of serosanguineous drainage noted. The wound margin is flat and intact. There is large (67-100%) red granulation within the wound bed. There is no necrotic tissue within the wound bed. General Notes: callus noted to periwound. Assessment Active Problems ICD-10 Other idiopathic peripheral autonomic neuropathy Non-pressure chronic ulcer of other part of right foot with fat layer exposed Bullous pemphigoid Essential  (primary) hypertension Spinal stenosis, lumbosacral region Procedures Wound #1 Pre-procedure diagnosis of Wound #1 is a Neuropathic Ulcer-Non Diabetic located on the Right Toe Third . There was a Selective/Open Wound Non-Viable Tissue Debridement with a total area of 0.09 sq cm performed by Worthy Keeler, PA. With the following instrument(s): Curette to remove Viable and Non-Viable tissue/material. Material removed includes Callus after achieving pain control using Other (benzocaine, 20%). No specimens were taken. A time out was conducted at 16:00, prior to the start of the procedure. A Minimum amount of bleeding was controlled with Pressure. The procedure was tolerated well with a pain level of 0 throughout and a pain level of 0 following the procedure. Post Debridement Measurements: 0.1cm length x 0.9cm width x 0.1cm depth; 0.007cm^3 volume. Character of Wound/Ulcer Post Debridement is improved. Post procedure Diagnosis Wound #1: Same as Pre-Procedure Plan Follow-up Appointments: Return Appointment in 1 week. Dressing Change Frequency: Wound #1 Right Toe Third: Change Dressing every third day Wound Cleansing: Wound #1 Right Toe Third: May shower and wash wound with soap and water. - with dressing changes Primary Wound Dressing: Wound #1 Right Toe Third: Hydrofera Blue Secondary Dressing: Wound #1 Right Toe Third: Kerlix/Rolled Gauze Dry Gauze 1. I would recommend currently that we continue with the wound care measures as before this is good include the Hydrofera Blue. I'm hopeful that he may actually be done by next week as far as his treatment is concerned. 2. I would recommend as well that we go ahead and continue with appropriate offloading as discussed before with the postop shoe. We will see patient back for reevaluation in 1 week here in the clinic. If anything worsens or changes patient will contact our office for additional recommendations. Electronic  Signature(s) Signed: 08/18/2019 4:06:31 PM By: Worthy Keeler PA-C Entered By: Worthy Keeler on 08/18/2019 16:06:31 -------------------------------------------------------------------------------- SuperBill Details Patient Name: Date of Service: [name] 08/18/2019 Medical Record [id number] Patient Account Number: [id number] Date of Birth/Sex: Treating RN: 1933/04/20 (86 y.o. Marvis Repress Primary Care Provider: [name] Other Clinician: Referring Provider: Treating Provider/Extender:[name], [name], [name] Weeks in Treatment: 2 Diagnosis Coding ICD-10 Codes Code Description G90.09 Other idiopathic peripheral autonomic neuropathy L97.512 Non-pressure chronic ulcer of other part of right foot with fat layer exposed L12.0 Bullous pemphigoid I10 Essential (primary) hypertension M48.07 Spinal stenosis, lumbosacral region Facility Procedures CPT4 Code Description: [id number] - DEBRIDE WOUND 1ST 20 SQ CM OR < ICD-10 Diagnosis Description L97.512 Non-pressure  chronic ulcer of other part of right foot with Modifier: fat layer e Quantity: 1 xposed Physician Procedures CPT4 Code Description: [id number] - WC PHYS DEBR WO ANESTH 20 SQ CM ICD-10 Diagnosis Description L97.512 Non-pressure chronic ulcer of other part of right foot with f Modifier: at layer exp Quantity: 1 osed Electronic Signature(s) Signed: 08/18/2019 4:06:43 PM By: Worthy Keeler PA-C Entered By: Worthy Keeler on 08/18/2019 16:06:42

## 2019-08-21 NOTE — Progress Notes (Signed)
GAIGE, BEHRMANN ([id number]) Visit Report for 08/18/2019 Arrival Information Details Patient Name: Date of Service: KELSY, MURROW 08/18/2019 3:00 PM Medical Record [id number] Patient Account Number: [id number] Date of Birth/Sex: Treating RN: [birth year] (86 y.o. Hessie Diener Primary Care Kaileigh Viswanathan: Patrina Levering Other Clinician: Referring Ainara Eldridge: Treating Vicki Chaffin/Extender:[name], [name], [name] Weeks in Treatment: 2 Visit Information History Since Last Visit Walker Added or deleted any medications: No Patient Arrived: Any new allergies or adverse reactions: No Arrival Time: 15:15 Had a fall or experienced change in No Accompanied By: self activities of daily living that may affect Transfer Assistance: None risk of falls: Patient Identification Verified: Yes Signs or symptoms of abuse/neglect since last No Secondary Verification Process Completed: Yes visito Patient Requires Transmission-Based No Hospitalized since last visit: No Precautions: Implantable device outside of the clinic excluding No Patient Has Alerts: No cellular tissue based products placed in the center since last visit: Has Dressing in Place as Prescribed: Yes Pain Present Now: No Electronic Signature(s) Signed: 08/18/2019 4:58:22 PM By: Deon Pilling Entered By: Deon Pilling on 08/18/2019 15:18:15 -------------------------------------------------------------------------------- Encounter Discharge Information Details Patient Name: Date of Service: [name] 08/18/2019 3:00 PM Medical Record [id number] Patient Account Number: [id number] Date of Birth/Sex: Treating RN: 05-29-1932 (86 y.o. Jerilynn Mages) Carlene Coria Primary Care Rayme Bui: Patrina Levering Other Clinician: Referring Bryttney Netzer: Treating Rocko Fesperman/Extender:[name], [name], [name] Weeks in Treatment: 2 Encounter Discharge Information Items Post Procedure Vitals Discharge Condition: Stable Temperature  (F): 98.3 Ambulatory Status: Walker Pulse (bpm): 75 Discharge Destination: Home Respiratory Rate (breaths/min): 20 Transportation: Private Auto Blood Pressure (mmHg): 152/69 Accompanied By: self Schedule Follow-up Appointment: Yes Clinical Summary of Care: Patient Declined Electronic Signature(s) Signed: 08/18/2019 4:56:09 PM By: Carlene Coria RN Entered By: Carlene Coria on 08/18/2019 16:34:26 -------------------------------------------------------------------------------- Lower Extremity Assessment Details Patient Name: Date of Service: TYRAN, CORDES 08/18/2019 3:00 PM Medical Record [id number] Patient Account Number: [id number] Date of Birth/Sex: Treating RN: [birth year] (86 y.o. Hessie Diener Primary Care Alanah Sakuma: Patrina Levering Other Clinician: Referring Jeorge Reister: Treating Meagen Limones/Extender:[name], [name], [name] Weeks in Treatment: 2 Edema Assessment Assessed: [Left: No] [Right: Yes] Edema: [Left: No] [Right: Yes] Calf Left: Right: Point of Measurement: 32 cm From Medial Instep cm 49.5 cm Ankle Left: Right: Point of Measurement: 12 cm From Medial Instep cm 32 cm Electronic Signature(s) Signed: 08/18/2019 4:58:22 PM By: Deon Pilling Entered By: Deon Pilling on 08/18/2019 15:20:09 -------------------------------------------------------------------------------- Multi-Disciplinary Care Plan Details Patient Name: Date of Service: [name] 08/18/2019 3:00 PM Medical Record [id number] Patient Account Number: [id number] Date of Birth/Sex: Treating RN: August 05, 1932 (86 y.o. Marvis Repress Primary Care Kemia Wendel: Patrina Levering Other Clinician: Referring Evert Wenrich: Treating Kaleisha Bhargava/Extender:[name], [name], [name] Weeks in Treatment: 2 Active Inactive Abuse / Safety / Falls / Self Care Management Nursing Diagnoses: Potential for falls Goals: Patient/caregiver will verbalize/demonstrate measures taken to prevent injury  and/or falls Date Initiated: 08/02/2019 Target Resolution Date: 08/30/2019 Goal Status: Active Interventions: Assess fall risk on admission and as needed Assess impairment of mobility on admission and as needed per policy Notes: Osteomyelitis Nursing Diagnoses: Infection: osteomyelitis Knowledge deficit related to disease process and management Goals: Patient/caregiver will verbalize understanding of disease process and disease management Date Initiated: 08/02/2019 Target Resolution Date: 08/30/2019 Goal Status: Active Patient's osteomyelitis will resolve Date Initiated: 08/02/2019 Target Resolution Date: 08/30/2019 Goal Status: Active Interventions: Assess for signs and symptoms of osteomyelitis resolution every visit Provide education on osteomyelitis Treatment Activities: Systemic antibiotics :  08/02/2019 Notes: Wound/Skin Impairment Nursing Diagnoses: Impaired tissue integrity Knowledge deficit related to ulceration/compromised skin integrity Goals: Patient/caregiver will verbalize understanding of skin care regimen Date Initiated: 08/02/2019 Target Resolution Date: 08/30/2019 Goal Status: Active Ulcer/skin breakdown will have a volume reduction of 30% by week 4 Date Initiated: 08/02/2019 Target Resolution Date: 08/30/2019 Goal Status: Active Interventions: Assess patient/caregiver ability to obtain necessary supplies Assess patient/caregiver ability to perform ulcer/skin care regimen upon admission and as needed Assess ulceration(s) every visit Provide education on ulcer and skin care Treatment Activities: Skin care regimen initiated : 08/02/2019 Topical wound management initiated : 08/02/2019 Notes: Electronic Signature(s) Signed: 08/21/2019 4:55:48 PM By: Kela Millin Entered By: Kela Millin on 08/18/2019 15:49:08 -------------------------------------------------------------------------------- Pain Assessment Details Patient Name: Date of Service: [name] 08/18/2019 3:00 PM Medical Record [id number] Patient Account Number: [id number] Date of Birth/Sex: Treating RN: 1932/12/27 (86 y.o. Hessie Diener Primary Care Tesha Archambeau: Patrina Levering Other Clinician: Referring Carlester Kasparek: Treating Akire Rennert/Extender:[name], [name], [name] Weeks in Treatment: 2 Active Problems Location of Pain Severity and Description of Pain Patient Has Paino No Site Locations Rate the pain. Current Pain Level: 0 Pain Management and Medication Current Pain Management: Medication: No Cold Application: No Rest: No Massage: No Activity: No T.E.N.S.: No Heat Application: No Leg drop or elevation: No Is the Current Pain Management Adequate: Adequate How does your wound impact your activities of daily livingo Sleep: No Bathing: No Appetite: No Relationship With Others: No Bladder Continence: No Emotions: No Bowel Continence: No Work: No Toileting: No Drive: No Dressing: No Hobbies: No Electronic Signature(s) Signed: 08/18/2019 4:58:22 PM By: Deon Pilling Entered By: Deon Pilling on 08/18/2019 15:20:50 -------------------------------------------------------------------------------- Patient/Caregiver Education Details Patient Name: Date of Service: Earnhardt, Trask L. 4/2/2021andnbsp3:00 PM Medical Record [id number] Patient Account Number: [id number] Date of Birth/Gender: Treating RN: 30-Mar-1933 (86 y.o. Marvis Repress Primary Care Physician: [name] Other Clinician: Referring Physician: Treating Physician/Extender:[name], [name], Junius Creamer in Treatment: 2 Education Assessment Education Provided To: Patient Education Topics Provided Infection: Methods: Explain/Verbal Responses: State content correctly Wound/Skin Impairment: Methods: Explain/Verbal Responses: State content correctly Electronic Signature(s) Signed: 08/21/2019 4:55:48 PM By: Kela Millin Entered By: Kela Millin on 08/18/2019 15:49:25 -------------------------------------------------------------------------------- Wound Assessment Details Patient Name: Date of Service: [name] 08/18/2019 3:00 PM Medical Record [id number] Patient Account Number: [id number] Date of Birth/Sex: Treating RN: Aug 11, 1932 (86 y.o. Hessie Diener Primary Care Karman Veney: [name] Other Clinician: Referring Magali Bray: Treating Zaylah Blecha/Extender:[name], [name], [name] Weeks in Treatment: 2 Wound Status Wound Number: 1 Primary Neuropathic Ulcer-Non Diabetic Etiology: Wound Location: Right Toe Third Wound Open Wounding Event: Blister Status: Date Acquired: 06/02/2019 Comorbid Asthma, Hypertension, Osteoarthritis, Weeks Of Treatment: 2 History: Osteomyelitis, Neuropathy Clustered Wound: No Wound Measurements Length: (cm) 0.1 % Reduct Width: (cm) 0.9 % Reduct Depth: (cm) 0.1 Epitheli Area: (cm) 0.071 Tunneli Volume: (cm) 0.007 Undermi Wound Description Full Thickness Without Exposed Support Foul Od Classification: Structures Slough/ Wound Flat and Intact Margin: Exudate Medium Amount: Exudate Serosanguineous Type: Exudate red, brown Color: Wound Bed Granulation Amount: Large (67-100%) Granulation Quality: Red Fascia E Necrotic Amount: None Present (0%) Fat Laye Tendon E Muscle E Joint Ex Bone Exp or After Cleansing: No Fibrino No Exposed Structure xposed: No r (Subcutaneous Tissue) Exposed: Yes xposed: No xposed: No posed: No osed: No ion in Area: 97.6% ion in Volume: 97.6% alization: Medium (34-66%) ng: No ning: No Assessment Notes callus noted to periwound. Treatment Notes Wound #1 (Right Toe  Third) 1. Cleanse With Wound Cleanser 3. Primary Dressing Applied Hydrofera Blue 4. Secondary Dressing Dry Gauze 5. Secured With Tape Notes Horticulturist, commercial) Signed: 08/18/2019 4:58:22 PM By: Deon Pilling Entered By: Deon Pilling  on 08/18/2019 15:21:25 -------------------------------------------------------------------------------- Vitals Details Patient Name: Date of Service: [name] 08/18/2019 3:00 PM Medical Record [id number] Patient Account Number: [id number] Date of Birth/Sex: Treating RN: Dec 24, 1932 (86 y.o. Hessie Diener Primary Care Evonna Stoltz: [name] Other Clinician: Referring Joan Avetisyan: Treating Dhwani Venkatesh/Extender:[name], [name], [name] Weeks in Treatment: 2 Vital Signs Time Taken: 15:16 Temperature (F): 98.3 Height (in): 71 Pulse (bpm): 75 Weight (lbs): 283 Respiratory Rate (breaths/min): 20 Body Mass Index (BMI): 39.5 Blood Pressure (mmHg): 152/69 Reference Range: 80 - 120 mg / dl Electronic Signature(s) Signed: 08/18/2019 4:58:22 PM By: Deon Pilling Entered By: Deon Pilling on 08/18/2019 15:19:25

## 2019-08-22 ENCOUNTER — Ambulatory Visit: Payer: Medicare PPO | Admitting: Infectious Diseases

## 2019-08-22 ENCOUNTER — Encounter: Payer: Self-pay | Admitting: Infectious Diseases

## 2019-08-22 ENCOUNTER — Other Ambulatory Visit: Payer: Self-pay

## 2019-08-22 DIAGNOSIS — I10 Essential (primary) hypertension: Secondary | ICD-10-CM | POA: Diagnosis not present

## 2019-08-22 DIAGNOSIS — R600 Localized edema: Secondary | ICD-10-CM | POA: Diagnosis not present

## 2019-08-22 DIAGNOSIS — M86071 Acute hematogenous osteomyelitis, right ankle and foot: Secondary | ICD-10-CM

## 2019-08-22 NOTE — Assessment & Plan Note (Signed)
He will f/u with his PCP

## 2019-08-22 NOTE — Progress Notes (Signed)
   Subjective:    Patient ID: [name], male    DOB: 11/21/1932, 86 y.o.   MRN: [id number]  HPI 86 yo M with hx of bullous phemigod currently managed with methotrexate. This was reduced last fall. He then developed a blister on his R third toe as well as area on his foot pad. He believes these became infected.  He had MRI that showed (06-16-19):1. Cellulitis of the third toe with early osteomyelitis of the third distal phalanx. No drainable fluid collection. 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  He has been seen by Ortho (Dr Alfonso Ramus), had plain film that was (-).   He was seen in ID on 2-4 and was given 4 doses of oritavancin, 1 week apart each.  At his f/u 07-25-19 he was placed on cipro-doxy. He has had [facility] f/u and is felt to be doing well. He is to have 1 more visit with them as they feel he is doing so well.  He completes his po anbx 4-8.  Has dressing done at [facility]. hydroferra.  Has had 2 COVID vax.   Review of Systems  Constitutional: Negative for chills and fever.  Gastrointestinal: Negative for constipation and diarrhea.  Genitourinary: Negative for difficulty urinating.  Skin: Positive for wound.       Objective:   Physical Exam Constitutional:      Appearance: Normal appearance. He is obese.  Cardiovascular:     Rate and Rhythm: Normal rate and regular rhythm.  Pulmonary:     Effort: Pulmonary effort is normal.     Breath sounds: Normal breath sounds.  Abdominal:     General: Bowel sounds are normal.     Palpations: Abdomen is soft.  Neurological:     Mental Status: He is alert.             Assessment & Plan:

## 2019-08-22 NOTE — Assessment & Plan Note (Signed)
Encouraged him to keep his leg elevated.

## 2019-08-22 NOTE — Assessment & Plan Note (Signed)
He appears to be improved.  His foot wound is closed, no heat. The erythema is at baseline.  He will complete his anbx this week He will complete his visits at [facility] I asked him to call if he has drainage, worsened erythema.  Will o/w see him prn.

## 2019-08-23 ENCOUNTER — Encounter (HOSPITAL_BASED_OUTPATIENT_CLINIC_OR_DEPARTMENT_OTHER): Payer: Medicare PPO | Admitting: Physician Assistant

## 2019-08-23 DIAGNOSIS — G9009 Other idiopathic peripheral autonomic neuropathy: Secondary | ICD-10-CM | POA: Diagnosis not present

## 2019-08-23 DIAGNOSIS — I10 Essential (primary) hypertension: Secondary | ICD-10-CM | POA: Diagnosis not present

## 2019-08-23 DIAGNOSIS — L97512 Non-pressure chronic ulcer of other part of right foot with fat layer exposed: Secondary | ICD-10-CM | POA: Diagnosis not present

## 2019-08-23 DIAGNOSIS — G629 Polyneuropathy, unspecified: Secondary | ICD-10-CM | POA: Diagnosis not present

## 2019-08-23 DIAGNOSIS — L120 Bullous pemphigoid: Secondary | ICD-10-CM | POA: Diagnosis not present

## 2019-08-23 NOTE — Progress Notes (Addendum)
[name] ([id number]) Visit Report for 08/23/2019 Chief Complaint Document Details Patient Name: Date of Service: [name], [name] 08/23/2019 3:15 PM Medical Record [id number] Patient Account Number: [id number] Date of Birth/Sex: Treating RN: Dec 23, 1932 (86 y.o. [name] Primary Care Provider: Lajean Manes [name] Other Clinician: Referring Provider: Treating Provider/Extender:[name] Weeks in Treatment: 3 Information Obtained from: Patient Chief Complaint Right 3rd toe ulcer Electronic Signature(s) Signed: 08/23/2019 3:17:02 PM By: Roy Keeler PA-C Entered By: [name] on 08/23/2019 15:17:01 -------------------------------------------------------------------------------- Debridement Details Patient Name: Date of Service: [name] 08/23/2019 3:15 PM Medical Record [id number] Patient Account Number: [id number] Date of Birth/Sex: Treating RN: 02/23/1933 (86 y.o. [name] Primary Care Provider: Lajean Manes [name] Other Clinician: Referring Provider: Treating Provider/Extender:[name] Weeks in Treatment: 3 Debridement Performed for Wound #1 Right Toe Third Assessment: Performed By: Physician Roy Keeler, PA Debridement Type: Debridement Level of Consciousness (Pre- Awake and Alert procedure): Pre-procedure Verification/Time Out Taken: Yes - 16:00 Start Time: 16:00 Pain Control: Lidocaine Total Area Debrided (L x W): 1 (cm) x 0.5 (cm) = 0.5 (cm) Tissue and other material Non-Viable, Callus debrided: Level: Non-Viable Tissue Debridement Description: Selective/Open Wound Instrument: Curette Bleeding: None End Time: 16:12 Procedural Pain: 0 Post Procedural Pain: 0 Response to Treatment: Procedure was tolerated well Level of Consciousness Awake and Alert (Post-procedure): Post Debridement Measurements of Total Wound Length: (cm) 0.1 Width: (cm) 0.1 Depth: (cm)  0.1 Volume: (cm) 0.001 Character of Wound/Ulcer Post Improved Debridement: Post Procedure Diagnosis Same as Pre-procedure Electronic Signature(s) Signed: 08/23/2019 6:07:56 PM By: Baruch Gouty RN, BSN Signed: 08/23/2019 7:02:55 PM By: Roy Keeler PA-C Entered By: Baruch Gouty on 08/23/2019 16:15:26 -------------------------------------------------------------------------------- HPI Details Patient Name: Date of Service: [name] 08/23/2019 3:15 PM Medical Record [id number] Patient Account Number: [id number] Date of Birth/Sex: Treating RN: [birth year] (86 y.o. [name] Primary Care Provider: Lajean Manes [name] Other Clinician: Referring Provider: Treating Provider/Extender:[name] Weeks in Treatment: 3 History of Present Illness HPI Description: 08/02/2019 upon evaluation today patient appears for initial evaluation here in our clinic concerning issues he has been having with his right third toe. He has a wound here that has been given him trouble for several weeks, months. He does have nondiabetic idiopathic peripheral neuropathy, bullous pemphigoid, hypertension, and spinal stenosis which is actually the cause of his neuropathy. Unfortunately he did have signs of osteomyelitis noted as well on his MRI which showed that he had cellulitis of the third toe with early osteomyelitis this was performed on 06/16/2019. With that being said he has been on IV Dalvance weekly infusions for 4 weeks and then subsequently was transitioned to Levaquin/doxycycline which she is currently on for the next month. He has been seeing Dr. Johnnye [name] at infectious disease who did refer him to [place]. Currently Xeroform is been used over the wound site at this point. He is also on methotrexate for the bullous pemphigoid which he has had for years. He does see dermatology for this. He also has an appoint with Dr. Johnnye [name] next week as well. Currently the patient  states that things have been seeming to get better he was surprised about how long he had to be on antibiotics however. No fevers, chills, nausea, vomiting, or diarrhea. This patient is potentially a hyperbaric oxygen therapy candidate depending on how things progress. With that being said I do think appropriate wound care may go a long  way of trying to get things doing better to be perfectly honest. 08/09/2019 upon evaluation today patient appears to actually be doing much better as compared to his last visit last week here in the office. He has been tolerating the Hydrofera Blue without complication. He does have a friend that lives down from him who has been coming to help change the dressings for him. That is been of great benefit as well he tells me. Fortunately there is no signs of active infection at this time. He has very little hypergranular tissue compared to last week. 08/18/2019 upon evaluation today patient actually appears to be doing excellent in regard to his toe ulcer. He has been tolerating the dressing changes without complication. Fortunately he is showing no signs of infection and the hyper granular tissue seems to be closing up quite nicely. Overall I'm extremely pleased with the progress. 08/23/2019 upon evaluation today patient appears to be doing excellent in regard to his wound. In fact he has some callus around the edges but fortunately there is no signs of active infection at this time in fact Dr. Johnnye [name] did see him yesterday this was his infectious disease doctor and he actually released him as things appear to be doing so well. I did review that note today as well. Otherwise the patient seems to be progressing quite nicely in my opinion. Electronic Signature(s) Signed: 08/23/2019 4:19:34 PM By: Roy Keeler PA-C Entered By: [name] on 08/23/2019 16:19:33 -------------------------------------------------------------------------------- Physical Exam  Details Patient Name: Date of Service: [name] 08/23/2019 3:15 PM Medical Record [id number] Patient Account Number: [id number] Date of Birth/Sex: Treating RN: [birth year] (86 y.o. [name] Primary Care Provider: Lajean Manes [name] Other Clinician: Referring Provider: Treating Provider/Extender:[name] Weeks in Treatment: 3 Constitutional Well-nourished and well-hydrated in no acute distress. Respiratory normal breathing without difficulty. Psychiatric this patient is able to make decisions and demonstrates good insight into disease process. Alert and Oriented x 3. pleasant and cooperative. Notes Upon inspection patient's wound bed actually showed signs of good granulation at this time. Fortunately there is no evidence of active infection. No fevers, chills, nausea, vomiting, or diarrhea. Overall I am extremely pleased with how things seem to be progressing at this time. I did perform sharp debridement just remove callus from around the edges of the wound he tolerated all this without complication. Electronic Signature(s) Signed: 08/23/2019 4:20:26 PM By: Roy Keeler PA-C Entered By: [name] on 08/23/2019 16:20:25 -------------------------------------------------------------------------------- Physician Orders Details Patient Name: Date of Service: JAZZ, GALE 08/23/2019 3:15 PM Medical Record [id number] Patient Account Number: [id number] Date of Birth/Sex: Treating RN: 27-Aug-1932 (86 y.o. [name] Primary Care Provider: Lajean Manes [name] Other Clinician: Referring Provider: Treating Provider/Extender:[name] Weeks in Treatment: 3 Verbal / Phone Orders: No Diagnosis Coding ICD-10 Coding Code Description G90.09 Other idiopathic peripheral autonomic neuropathy L97.512 Non-pressure chronic ulcer of other part of right foot with fat layer exposed L12.0 Bullous pemphigoid I10  Essential (primary) hypertension M48.07 Spinal stenosis, lumbosacral region Follow-up Appointments Return Appointment in 1 week. Dressing Change Frequency Wound #1 Right Toe Third Change Dressing every third day Wound Cleansing Wound #1 Right Toe Third May shower and wash wound with soap and water. - with dressing changes Primary Wound Dressing Wound #1 Right Toe Third Hydrofera Blue Secondary Dressing Wound #1 Right Toe Third Kerlix/Rolled Gauze Dry Gauze Electronic Signature(s) Signed: 08/23/2019 6:07:56 PM By: Baruch Gouty RN, BSN Signed:  08/23/2019 7:02:55 PM By: Roy Keeler PA-C Entered By: Baruch Gouty on 08/23/2019 16:16:58 -------------------------------------------------------------------------------- Problem List Details Patient Name: Date of Service: [name] 08/23/2019 3:15 PM Medical Record [id number] Patient Account Number: [id number] Date of Birth/Sex: Treating RN: 29-Aug-1932 (86 y.o. [name] Primary Care Provider: Lajean Manes [name] Other Clinician: Referring Provider: Treating Provider/Extender:[name] Weeks in Treatment: 3 Active Problems ICD-10 Evaluated Encounter Code Description Active Date Today Diagnosis G90.09 Other idiopathic peripheral autonomic neuropathy 08/02/2019 No Yes L97.512 Non-pressure chronic ulcer of other part of right foot 08/02/2019 No Yes with fat layer exposed L12.0 Bullous pemphigoid 08/02/2019 No Yes I10 Essential (primary) hypertension 08/02/2019 No Yes M48.07 Spinal stenosis, lumbosacral region 08/02/2019 No Yes Inactive Problems Resolved Problems Electronic Signature(s) Signed: 08/23/2019 3:16:55 PM By: Roy Keeler PA-C Entered By: [name] on 08/23/2019 15:16:54 -------------------------------------------------------------------------------- Progress Note Details Patient Name: Date of Service: [name] 08/23/2019 3:15 PM Medical Record  [id number] Patient Account Number: [id number] Date of Birth/Sex: Treating RN: 02-24-1933 (86 y.o. [name] Primary Care Provider: Lajean Manes [name] Other Clinician: Referring Provider: Treating Provider/Extender:[name] Weeks in Treatment: 3 Subjective Chief Complaint Information obtained from Patient Right 3rd toe ulcer History of Present Illness (HPI) 08/02/2019 upon evaluation today patient appears for initial evaluation here in our clinic concerning issues he has been having with his right third toe. He has a wound here that has been given him trouble for several weeks, months. He does have nondiabetic idiopathic peripheral neuropathy, bullous pemphigoid, hypertension, and spinal stenosis which is actually the cause of his neuropathy. Unfortunately he did have signs of osteomyelitis noted as well on his MRI which showed that he had cellulitis of the third toe with early osteomyelitis this was performed on 06/16/2019. With that being said he has been on IV Dalvance weekly infusions for 4 weeks and then subsequently was transitioned to Levaquin/doxycycline which she is currently on for the next month. He has been seeing Dr. Johnnye [name] at infectious disease who did refer him to [place]. Currently Xeroform is been used over the wound site at this point. He is also on methotrexate for the bullous pemphigoid which he has had for years. He does see dermatology for this. He also has an appoint with Dr. Johnnye [name] next week as well. Currently the patient states that things have been seeming to get better he was surprised about how long he had to be on antibiotics however. No fevers, chills, nausea, vomiting, or diarrhea. This patient is potentially a hyperbaric oxygen therapy candidate depending on how things progress. With that being said I do think appropriate wound care may go a long way of trying to get things doing better to be perfectly honest. 08/09/2019 upon  evaluation today patient appears to actually be doing much better as compared to his last visit last week here in the office. He has been tolerating the Hydrofera Blue without complication. He does have a friend that lives down from him who has been coming to help change the dressings for him. That is been of great benefit as well he tells me. Fortunately there is no signs of active infection at this time. He has very little hypergranular tissue compared to last week. 08/18/2019 upon evaluation today patient actually appears to be doing excellent in regard to his toe ulcer. He has been tolerating the dressing changes without complication. Fortunately he is showing no signs of infection and the hyper granular  tissue seems to be closing up quite nicely. Overall I'm extremely pleased with the progress. 08/23/2019 upon evaluation today patient appears to be doing excellent in regard to his wound. In fact he has some callus around the edges but fortunately there is no signs of active infection at this time in fact Dr. Johnnye [name] did see him yesterday this was his infectious disease doctor and he actually released him as things appear to be doing so well. I did review that note today as well. Otherwise the patient seems to be progressing quite nicely in my opinion. Objective Constitutional Well-nourished and well-hydrated in no acute distress. Vitals Time Taken: 3:37 PM, Height: 71 in, Weight: 283 lbs, BMI: 39.5, Temperature: 97.5 F, Pulse: 61 bpm, Respiratory Rate: 20 breaths/min, Blood Pressure: 177/75 mmHg. Respiratory normal breathing without difficulty. Psychiatric this patient is able to make decisions and demonstrates good insight into disease process. Alert and Oriented x 3. pleasant and cooperative. General Notes: Upon inspection patient's wound bed actually showed signs of good granulation at this time. Fortunately there is no evidence of active infection. No fevers, chills, nausea, vomiting,  or diarrhea. Overall I am extremely pleased with how things seem to be progressing at this time. I did perform sharp debridement just remove callus from around the edges of the wound he tolerated all this without complication. Integumentary (Hair, Skin) Wound #1 status is Open. Original cause of wound was Blister. The wound is located on the Right Toe Third. The wound measures 0.1cm length x 0.3cm width x 0.1cm depth; 0.024cm^2 area and 0.002cm^3 volume. There is Fat Layer (Subcutaneous Tissue) Exposed exposed. There is no tunneling or undermining noted. There is a small amount of serosanguineous drainage noted. The wound margin is flat and intact. There is large (67-100%) red granulation within the wound bed. There is no necrotic tissue within the wound bed. Assessment Active Problems ICD-10 Other idiopathic peripheral autonomic neuropathy Non-pressure chronic ulcer of other part of right foot with fat layer exposed Bullous pemphigoid Essential (primary) hypertension Spinal stenosis, lumbosacral region Procedures Wound #1 Pre-procedure diagnosis of Wound #1 is a Neuropathic Ulcer-Non Diabetic located on the Right Toe Third . There was a Selective/Open Wound Non-Viable Tissue Debridement with a total area of 0.5 sq cm performed by Roy Keeler, PA. With the following instrument(s): Curette to remove Non-Viable tissue/material. Material removed includes Callus after achieving pain control using Lidocaine. No specimens were taken. A time out was conducted at 16:00, prior to the start of the procedure. There was no bleeding. The procedure was tolerated well with a pain level of 0 throughout and a pain level of 0 following the procedure. Post Debridement Measurements: 0.1cm length x 0.1cm width x 0.1cm depth; 0.001cm^3 volume. Character of Wound/Ulcer Post Debridement is improved. Post procedure Diagnosis Wound #1: Same as Pre-Procedure Plan Follow-up Appointments: Return Appointment in  1 week. Dressing Change Frequency: Wound #1 Right Toe Third: Change Dressing every third day Wound Cleansing: Wound #1 Right Toe Third: May shower and wash wound with soap and water. - with dressing changes Primary Wound Dressing: Wound #1 Right Toe Third: Hydrofera Blue Secondary Dressing: Wound #1 Right Toe Third: Kerlix/Rolled Gauze Dry Gauze 1. I Georgina Peer suggest currently that we continue with the [facility] more for cushioning but to make sure that hopefully this continues to progress nicely overall I think he is very close to complete resolution in 3 weeks time he has made excellent progress. 2. I am also can recommend at this time that  the patient continue with the dressing changes really every other day I think that is an appropriate thing to do in fact I think he is doing every third day which is also just fine at this point. 3. Also recommend that he complete the antibiotics which will be completed as of tomorrow and then obviously hopefully he will not need any additional antibiotic therapy going forward. We will see patient back for reevaluation in 1 week here in the clinic. If anything worsens or changes patient will contact our office for additional recommendations. Electronic Signature(s) Signed: 08/23/2019 4:21:17 PM By: Roy Keeler PA-C Entered By: [name] on 08/23/2019 16:21:17 -------------------------------------------------------------------------------- SuperBill Details Patient Name: Date of Service: [name] 08/23/2019 Medical Record [id number] Patient Account Number: [id number] Date of Birth/Sex: Treating RN: 1932/11/27 (86 y.o. [name] Primary Care Provider: Lajean Manes [name] Other Clinician: Referring Provider: Treating Provider/Extender:[name] Weeks in Treatment: 3 Diagnosis Coding ICD-10 Codes Code Description G90.09 Other idiopathic peripheral autonomic neuropathy L97.512  Non-pressure chronic ulcer of other part of right foot with fat layer exposed L12.0 Bullous pemphigoid I10 Essential (primary) hypertension M48.07 Spinal stenosis, lumbosacral region Facility Procedures CPT4 Code Description: [id number] - DEBRIDE WOUND 1ST 20 SQ CM OR < ICD-10 Diagnosis Description L97.512 Non-pressure chronic ulcer of other part of right foot with fat Modifier: layer exposed Quantity: 1 Physician Procedures Electronic Signature(s) Signed: 08/23/2019 4:21:22 PM By: Roy Keeler PA-C Entered By: [name] on 08/23/2019 16:21:22

## 2019-08-24 ENCOUNTER — Ambulatory Visit (HOSPITAL_BASED_OUTPATIENT_CLINIC_OR_DEPARTMENT_OTHER): Payer: Medicare PPO | Admitting: Internal Medicine

## 2019-08-30 ENCOUNTER — Encounter (HOSPITAL_BASED_OUTPATIENT_CLINIC_OR_DEPARTMENT_OTHER): Payer: Medicare PPO | Admitting: Physician Assistant

## 2019-08-30 ENCOUNTER — Other Ambulatory Visit: Payer: Self-pay

## 2019-08-30 DIAGNOSIS — L120 Bullous pemphigoid: Secondary | ICD-10-CM | POA: Diagnosis not present

## 2019-08-30 DIAGNOSIS — G9009 Other idiopathic peripheral autonomic neuropathy: Secondary | ICD-10-CM | POA: Diagnosis not present

## 2019-08-30 DIAGNOSIS — L97512 Non-pressure chronic ulcer of other part of right foot with fat layer exposed: Secondary | ICD-10-CM | POA: Diagnosis not present

## 2019-08-30 DIAGNOSIS — I10 Essential (primary) hypertension: Secondary | ICD-10-CM | POA: Diagnosis not present

## 2019-08-30 NOTE — Progress Notes (Addendum)
ARIB, ULCH ([id number]) Visit Report for 08/30/2019 Chief Complaint Document Details Patient Name: Date of Service: [name], [name] 08/30/2019 1:15 PM Medical Record [id number] Patient Account Number: [id number] Date of Birth/Sex: Treating RN: 04-25-1933 (86 y.o. [name] Primary Care Provider: Lajean Manes [name] Other Clinician: Referring Provider: Treating Provider/Extender:[name], [name], [name] in Treatment: 4 Information Obtained from: Patient Chief Complaint Right 3rd toe ulcer Electronic Signature(s) Signed: 08/30/2019 1:36:09 PM By: Roy Keeler PA-C Entered By: [name] on 08/30/2019 13:36:09 -------------------------------------------------------------------------------- HPI Details Patient Name: Date of Service: [name] 08/30/2019 1:15 PM Medical Record [id number] Patient Account Number: [id number] Date of Birth/Sex: Treating RN: [birth year] (86 y.o. [name] Primary Care Provider: Lajean Manes [name] Other Clinician: Referring Provider: Treating Provider/Extender:[name], [name], [name] in Treatment: 4 History of Present Illness HPI Description: 08/02/2019 upon evaluation today patient appears for initial evaluation here in our clinic concerning issues he has been having with his right third toe. He has a wound here that has been given him trouble for several [name], months. He does have nondiabetic idiopathic peripheral neuropathy, bullous pemphigoid, hypertension, and spinal stenosis which is actually the cause of his neuropathy. Unfortunately he did have signs of osteomyelitis noted as well on his MRI which showed that he had cellulitis of the third toe with early osteomyelitis this was performed on 06/16/2019. With that being said he has been on IV Dalvance weekly infusions for 4 [name] and then subsequently was transitioned to Levaquin/doxycycline which she is currently on for the  next month. He has been seeing Dr. Johnnye [name] at infectious disease who did refer him to [place]. Currently Xeroform is been used over the wound site at this point. He is also on methotrexate for the bullous pemphigoid which he has had for years. He does see dermatology for this. He also has an appoint with Dr. Johnnye [name] next week as well. Currently the patient states that things have been seeming to get better he was surprised about how long he had to be on antibiotics however. No fevers, chills, nausea, vomiting, or diarrhea. This patient is potentially a hyperbaric oxygen therapy candidate depending on how things progress. With that being said I do think appropriate wound care may go a long way of trying to get things doing better to be perfectly honest. 08/09/2019 upon evaluation today patient appears to actually be doing much better as compared to his last visit last week here in the office. He has been tolerating the Hydrofera Blue without complication. He does have a friend that lives down from him who has been coming to help change the dressings for him. That is been of great benefit as well he tells me. Fortunately there is no signs of active infection at this time. He has very little hypergranular tissue compared to last week. 08/18/2019 upon evaluation today patient actually appears to be doing excellent in regard to his toe ulcer. He has been tolerating the dressing changes without complication. Fortunately he is showing no signs of infection and the hyper granular tissue seems to be closing up quite nicely. Overall I'm extremely pleased with the progress. 08/23/2019 upon evaluation today patient appears to be doing excellent in regard to his wound. In fact he has some callus around the edges but fortunately there is no signs of active infection at this time in fact Dr. Johnnye [name] did see him yesterday this was his infectious disease doctor and he actually released him as  things appear to be doing  so well. I did review that note today as well. Otherwise the patient seems to be progressing quite nicely in my opinion. 08/30/19 upon evaluation today patient appears to be doing excellent in regard to his toe ulcer. In fact this appears to be completely healed based on what I am seeing at this time. Has had no drainage really since last week and overall I am pleased with how things seem to progress. Electronic Signature(s) Signed: 08/30/2019 2:56:15 PM By: Roy Keeler PA-C Entered By: [name] on 08/30/2019 14:56:14 -------------------------------------------------------------------------------- Physical Exam Details Patient Name: Date of Service: [name], [name] 08/30/2019 1:15 PM Medical Record [id number] Patient Account Number: [id number] Date of Birth/Sex: Treating RN: Aug 12, 1932 (86 y.o. [name] Primary Care Provider: Lajean Manes [name] Other Clinician: Referring Provider: Treating Provider/Extender:[name], [name], [name] in Treatment: 4 Constitutional Well-nourished and well-hydrated in no acute distress. Respiratory normal breathing without difficulty. Psychiatric this patient is able to make decisions and demonstrates good insight into disease process. Alert and Oriented x 3. pleasant and cooperative. Notes Patient's wound again showed signs of a little dry skin but there did not appear to be any open wound at this time. There is no drainage or fluid collection underneath this region I did not want to perform debridement to clear the dried skin away as I am afraid that I may actually cause an injury to the toe. Other than that though everything seems to be doing excellent and there is no evidence of infection. Electronic Signature(s) Signed: 08/30/2019 2:56:43 PM By: Roy Keeler PA-C Entered By: [name] on 08/30/2019 14:56:42 -------------------------------------------------------------------------------- Physician  Orders Details Patient Name: Date of Service: [name] 08/30/2019 1:15 PM Medical Record [id number] Patient Account Number: [id number] Date of Birth/Sex: Treating RN: 07/05/1932 (86 y.o. [name] Primary Care Provider: Lajean Manes [name] Other Clinician: Referring Provider: Treating Provider/Extender:[name], [name], [name] in Treatment: 4 Verbal / Phone Orders: No Diagnosis Coding ICD-10 Coding Code Description G90.09 Other idiopathic peripheral autonomic neuropathy L97.512 Non-pressure chronic ulcer of other part of right foot with fat layer exposed L12.0 Bullous pemphigoid I10 Essential (primary) hypertension M48.07 Spinal stenosis, lumbosacral region Discharge From [facility] Services Discharge from [place] Frequency Other: - as needed Wound Cleansing May shower and wash wound with soap and water. Primary Wound Dressing Foam - to protect end of 3rd toe for next 1-2 [name] Electronic Signature(s) Signed: 08/30/2019 4:44:47 PM By: Roy Keeler PA-C Signed: 08/30/2019 5:43:24 PM By: Baruch Gouty RN, BSN Entered By: Baruch Gouty on 08/30/2019 14:57:04 -------------------------------------------------------------------------------- Problem List Details Patient Name: Date of Service: [name] 08/30/2019 1:15 PM Medical Record [id number] Patient Account Number: [id number] Date of Birth/Sex: Treating RN: May 14, 1933 (86 y.o. [name] Primary Care Provider: Lajean Manes [name] Other Clinician: Referring Provider: Treating Provider/Extender:[name], [name], [name] in Treatment: 4 Active Problems ICD-10 Evaluated Encounter Code Description Active Date Today Diagnosis G90.09 Other idiopathic peripheral autonomic neuropathy 08/02/2019 No Yes L97.512 Non-pressure chronic ulcer of other part of right foot 08/02/2019 No Yes with fat layer exposed L12.0 Bullous pemphigoid  08/02/2019 No Yes I10 Essential (primary) hypertension 08/02/2019 No Yes M48.07 Spinal stenosis, lumbosacral region 08/02/2019 No Yes Inactive Problems Resolved Problems Electronic Signature(s) Signed: 08/30/2019 1:36:04 PM By: Roy Keeler PA-C Entered By: [name] on 08/30/2019 13:36:04 -------------------------------------------------------------------------------- Progress Note Details Patient Name: Date of Service: Roy Canavan  L. 08/30/2019 1:15 PM Medical Record [id number] Patient Account Number: [id number] Date of Birth/Sex: Treating RN: 1932-09-01 (86 y.o. [name] Primary Care Provider: Lajean Manes [name] Other Clinician: Referring Provider: Treating Provider/Extender:[name], [name], [name] in Treatment: 4 Subjective Chief Complaint Information obtained from Patient Right 3rd toe ulcer History of Present Illness (HPI) 08/02/2019 upon evaluation today patient appears for initial evaluation here in our clinic concerning issues he has been having with his right third toe. He has a wound here that has been given him trouble for several [name], months. He does have nondiabetic idiopathic peripheral neuropathy, bullous pemphigoid, hypertension, and spinal stenosis which is actually the cause of his neuropathy. Unfortunately he did have signs of osteomyelitis noted as well on his MRI which showed that he had cellulitis of the third toe with early osteomyelitis this was performed on 06/16/2019. With that being said he has been on IV Dalvance weekly infusions for 4 [name] and then subsequently was transitioned to Levaquin/doxycycline which she is currently on for the next month. He has been seeing Dr. Johnnye [name] at infectious disease who did refer him to [place]. Currently Xeroform is been used over the wound site at this point. He is also on methotrexate for the bullous pemphigoid which he has had for years. He does see dermatology for this. He also has  an appoint with Dr. Johnnye [name] next week as well. Currently the patient states that things have been seeming to get better he was surprised about how long he had to be on antibiotics however. No fevers, chills, nausea, vomiting, or diarrhea. This patient is potentially a hyperbaric oxygen therapy candidate depending on how things progress. With that being said I do think appropriate wound care may go a long way of trying to get things doing better to be perfectly honest. 08/09/2019 upon evaluation today patient appears to actually be doing much better as compared to his last visit last week here in the office. He has been tolerating the Hydrofera Blue without complication. He does have a friend that lives down from him who has been coming to help change the dressings for him. That is been of great benefit as well he tells me. Fortunately there is no signs of active infection at this time. He has very little hypergranular tissue compared to last week. 08/18/2019 upon evaluation today patient actually appears to be doing excellent in regard to his toe ulcer. He has been tolerating the dressing changes without complication. Fortunately he is showing no signs of infection and the hyper granular tissue seems to be closing up quite nicely. Overall I'm extremely pleased with the progress. 08/23/2019 upon evaluation today patient appears to be doing excellent in regard to his wound. In fact he has some callus around the edges but fortunately there is no signs of active infection at this time in fact Dr. Johnnye [name] did see him yesterday this was his infectious disease doctor and he actually released him as things appear to be doing so well. I did review that note today as well. Otherwise the patient seems to be progressing quite nicely in my opinion. 08/30/19 upon evaluation today patient appears to be doing excellent in regard to his toe ulcer. In fact this appears to be completely healed based on what I am seeing at  this time. Has had no drainage really since last week and overall I am pleased with how things seem to progress. Objective Constitutional Well-nourished and well-hydrated in no acute distress. Vitals Time  Taken: 1:48 PM, Height: 71 in, Weight: 283 lbs, BMI: 39.5, Temperature: 98.6 F, Pulse: 61 bpm, Respiratory Rate: 20 breaths/min, Blood Pressure: 170/61 mmHg. Respiratory normal breathing without difficulty. Psychiatric this patient is able to make decisions and demonstrates good insight into disease process. Alert and Oriented x 3. pleasant and cooperative. General Notes: Patient's wound again showed signs of a little dry skin but there did not appear to be any open wound at this time. There is no drainage or fluid collection underneath this region I did not want to perform debridement to clear the dried skin away as I am afraid that I may actually cause an injury to the toe. Other than that though everything seems to be doing excellent and there is no evidence of infection. Integumentary (Hair, Skin) Wound #1 status is Healed - Epithelialized. Original cause of wound was Blister. The wound is located on the Right Toe Third. The wound measures 0cm length x 0cm width x 0cm depth; 0cm^2 area and 0cm^3 volume. There is Fat Layer (Subcutaneous Tissue) Exposed exposed. There is no tunneling or undermining noted. There is a none present amount of drainage noted. The wound margin is flat and intact. There is no granulation within the wound bed. There is no necrotic tissue within the wound bed. Assessment Active Problems ICD-10 Other idiopathic peripheral autonomic neuropathy Non-pressure chronic ulcer of other part of right foot with fat layer exposed Bullous pemphigoid Essential (primary) hypertension Spinal stenosis, lumbosacral region Plan Discharge From [facility] Services: Discharge from [place] Frequency: Other: - as needed Wound Cleansing: May shower and wash  wound with soap and water. Primary Wound Dressing: Foam - to protect end of 3rd toe for next 1-2 [name] 1. My suggestion at this time is can be that we go ahead and continue with the foam padding on the toe in order to prevent anything from breaking down. I think he can take a shower although there is no medication is needed at this point over the toe region. 2. I would recommend that the patient monitor for any signs of worsening if he has any drainage or any issues he can contact the office and let us know. We will see the patient back for follow-up visit as needed. Electronic Signature(s) Signed: 08/30/2019 2:58:00 PM By: Roy Keeler PA-C Entered By: [name] on 08/30/2019 14:58:00 -------------------------------------------------------------------------------- SuperBill Details Patient Name: Date of Service: [name] 08/30/2019 Medical Record [phone] Patient Account Number: [id number] Date of Birth/Sex: Treating RN: [birth year] (86 y.o. [name] Primary Care Provider: Lajean Manes [name] Other Clinician: Referring Provider: Treating Provider/Extender:[name], [name], [name] in Treatment: 4 Diagnosis Coding ICD-10 Codes Code Description G90.09 Other idiopathic peripheral autonomic neuropathy L97.512 Non-pressure chronic ulcer of other part of right foot with fat layer exposed L12.0 Bullous pemphigoid I10 Essential (primary) hypertension M48.07 Spinal stenosis, lumbosacral region Facility Procedures CPT4 Code: [id number] Description: 99213 - WOUND CARE VISIT-LEV 3 EST PT Modifier: Quantity: 1 Physician Procedures CPT4 Code Description: [id number] - WC PHYS LEVEL 3 - EST PT ICD-10 Diagnosis Description G90.09 Other idiopathic peripheral autonomic neuropathy L97.512 Non-pressure chronic ulcer of other part of right foot L12.0 Bullous pemphigoid I10 Essential  (primary) hypertension Modifier: with fat layer e Quantity: 1  xposed Electronic Signature(s) Signed: 08/30/2019 2:58:11 PM By: Roy Keeler PA-C Entered By: [name] on 08/30/2019 14:58:10

## 2019-08-30 NOTE — Progress Notes (Signed)
[name], [name] ([id number]) Visit Report for 08/23/2019 Arrival Information Details Patient Name: Date of Service: [name], [name] 08/23/2019 3:15 PM Medical Record [id number] Patient Account Number: [id number] Date of Birth/Sex: Treating RN: 20-Feb-1933 (86 y.o. [name] Primary Care Jaizon Deroos: [name] Other Clinician: Referring Achillies Buehl: Treating Ayssa Bentivegna/Extender:[name], [name], [name] Weeks in Treatment: 3 Visit Information History Since Last Visit Walker Added or deleted any medications: No Patient Arrived: Any new allergies or adverse reactions: No Arrival Time: 15:32 Had a fall or experienced change in No Accompanied By: self activities of daily living that may affect Transfer Assistance: None risk of falls: Patient Identification Verified: Yes Signs or symptoms of abuse/neglect since last No Secondary Verification Process Completed: Yes visito Patient Requires Transmission-Based No Hospitalized since last visit: No Precautions: Implantable device outside of the clinic excluding No Patient Has Alerts: No cellular tissue based products placed in the center since last visit: Has Dressing in Place as Prescribed: Yes Pain Present Now: No Electronic Signature(s) Signed: 08/30/2019 9:20:10 AM By: Sandre Kitty Entered By: Sandre Kitty on 08/23/2019 15:37:19 -------------------------------------------------------------------------------- Encounter Discharge Information Details Patient Name: Date of Service: [name] 08/23/2019 3:15 PM Medical Record [id number] Patient Account Number: [id number] Date of Birth/Sex: Treating RN: 1932/06/24 (86 y.o. Jerilynn Mages) Carlene Coria Primary Care Marilee Ditommaso: Patrina Levering Other Clinician: Referring Briell Paulette: Treating Tonni Mansour/Extender:[name], [name], [name] Weeks in Treatment: 3 Encounter Discharge Information Items Post Procedure Vitals Discharge Condition:  Stable Temperature (F): 97.5 Ambulatory Status: Walker Pulse (bpm): 61 Discharge Destination: Home Respiratory Rate (breaths/min): 20 Transportation: Private Auto Blood Pressure (mmHg): 177/75 Accompanied By: self Schedule Follow-up Appointment: Yes Clinical Summary of Care: Patient Declined Electronic Signature(s) Signed: 08/23/2019 5:52:27 PM By: Carlene Coria RN Entered By: Carlene Coria on 08/23/2019 16:26:09 -------------------------------------------------------------------------------- Lower Extremity Assessment Details Patient Name: Date of Service: [name], [name] 08/23/2019 3:15 PM Medical Record [id number] Patient Account Number: [id number] Date of Birth/Sex: Treating RN: 04-03-1933 (86 y.o. [name] Primary Care Miro Balderson: [name] Other Clinician: Referring Jeriyah Granlund: Treating Johnson Arizola/Extender:[name], [name], [name] Weeks in Treatment: 3 Edema Assessment Assessed: [Left: No] [Right: No] Edema: [Left: No] [Right: Yes] Calf Left: Right: Point of Measurement: 32 cm From Medial Instep cm 50.5 cm Ankle Left: Right: Point of Measurement: 12 cm From Medial Instep cm 31 cm Vascular Assessment Pulses: Dorsalis Pedis Palpable: [Right:Yes] Electronic Signature(s) Signed: 08/23/2019 6:07:56 PM By: Baruch Gouty RN, BSN Signed: 08/30/2019 9:20:10 AM By: Sandre Kitty Entered By: Sandre Kitty on 08/23/2019 15:39:31 -------------------------------------------------------------------------------- Multi-Disciplinary Care Plan Details Patient Name: Date of Service: [name] 08/23/2019 3:15 PM Medical Record [id number] Patient Account Number: [id number] Date of Birth/Sex: Treating RN: 05/30/1932 (86 y.o. [name] Primary Care Vin Yonke: Other Clinician: Patrina Levering Referring Allizon Woznick: Treating Eldean Nanna/Extender:[name], [name], [name] Weeks in Treatment: 3 Active Inactive Abuse / Safety / Falls  / Self Care Management Nursing Diagnoses: Potential for falls Goals: Patient/caregiver will verbalize/demonstrate measures taken to prevent injury and/or falls Date Initiated: 08/02/2019 Target Resolution Date: 08/30/2019 Goal Status: Active Interventions: Assess fall risk on admission and as needed Assess impairment of mobility on admission and as needed per policy Notes: Wound/Skin Impairment Nursing Diagnoses: Impaired tissue integrity Knowledge deficit related to ulceration/compromised skin integrity Goals: Patient/caregiver will verbalize understanding of skin care regimen Date Initiated: 08/02/2019 Target Resolution Date: 08/30/2019 Goal Status: Active Ulcer/skin breakdown will have a volume reduction of 30% by week 4 Date Initiated: 08/02/2019 Target Resolution Date: 08/30/2019  Goal Status: Active Interventions: Assess patient/caregiver ability to obtain necessary supplies Assess patient/caregiver ability to perform ulcer/skin care regimen upon admission and as needed Assess ulceration(s) every visit Provide education on ulcer and skin care Treatment Activities: Skin care regimen initiated : 08/02/2019 Topical wound management initiated : 08/02/2019 Notes: Electronic Signature(s) Signed: 08/23/2019 6:07:56 PM By: Baruch Gouty RN, BSN Entered By: Baruch Gouty on 08/23/2019 16:08:42 -------------------------------------------------------------------------------- Pain Assessment Details Patient Name: Date of Service: [name] 08/23/2019 3:15 PM Medical Record [id number] Patient Account Number: [id number] Date of Birth/Sex: Treating RN: [birth year] (86 y.o. [name] Primary Care [place] Oborn: [name] Other Clinician: Referring Clessie Karras: Treating Masson Nalepa/Extender:[name], [name], [name] Weeks in Treatment: 3 Active Problems Location of Pain Severity and Description of Pain Patient Has Paino No Site Locations Pain Management and  Medication Current Pain Management: Electronic Signature(s) Signed: 08/23/2019 6:07:56 PM By: Baruch Gouty RN, BSN Signed: 08/30/2019 9:20:10 AM By: Sandre Kitty Entered By: Sandre Kitty on 08/23/2019 15:37:36 -------------------------------------------------------------------------------- Patient/Caregiver Education Details Patient Name: Date of Service: [name] 4/7/2021andnbsp3:15 PM Medical Record [id number] Patient Account Number: [id number] Date of Birth/Gender: Treating RN: Jun 28, 1932 (86 y.o. [name] Primary Care Physician: [name] Other Clinician: Referring Physician: Treating Physician/Extender:[name], [name], Junius Creamer in Treatment: 3 Education Assessment Education Provided To: Patient Education Topics Provided Wound/Skin Impairment: Methods: Explain/Verbal Responses: Reinforcements needed, State content correctly Electronic Signature(s) Signed: 08/23/2019 6:07:56 PM By: Baruch Gouty RN, BSN Entered By: Baruch Gouty on 08/23/2019 16:08:57 -------------------------------------------------------------------------------- Wound Assessment Details Patient Name: Date of Service: [name] 08/23/2019 3:15 PM Medical Record [id number] Patient Account Number: [id number] Date of Birth/Sex: Treating RN: [birth year] (86 y.o. [name] Primary Care Vinetta Brach: [name] Other Clinician: Referring Paris Chiriboga: Treating Emaan Gary/Extender:[name], [name], [name] Weeks in Treatment: 3 Wound Status Wound Number: 1 Primary Neuropathic Ulcer-Non Diabetic Etiology: Wound Location: Right Toe Third Wound Open Wounding Event: Blister Status: Date Acquired: 06/02/2019 Comorbid Asthma, Hypertension, Osteoarthritis, Weeks Of Treatment: 3 History: Osteomyelitis, Neuropathy Clustered Wound: No Photos Photo Uploaded By: Mikeal Hawthorne on 08/25/2019 11:03:19 Wound Measurements Length: (cm)  0.1 % Reduct Width: (cm) 0.3 % Reduct Depth: (cm) 0.1 Epitheli Area: (cm) 0.024 Tunneli Volume: (cm) 0.002 Undermi Wound Description Full Thickness Without Exposed Support Foul Od Classification: Structures Slough/ Wound Wound Flat and Intact Margin: Exudate Small Amount: Exudate Serosanguineous Type: Exudate red, brown Color: Wound Bed Granulation Amount: Large (67-100%) Granulation Quality: Red Fascia Necrotic Amount: None Present (0%) Fat Lay Tendon Muscle Joint E Bone Ex or After Cleansing: No Fibrino No Exposed Structure Exposed: No er (Subcutaneous Tissue) Exposed: Yes Exposed: No Exposed: No xposed: No posed: No ion in Area: 99.2% ion in Volume: 99.3% alization: Large (67-100%) ng: No ning: No Treatment Notes Wound #1 (Right Toe Third) 1. Cleanse With Wound Cleanser 3. Primary Dressing Applied Hydrofera Blue 4. Secondary Dressing Dry Gauze Notes netting Electronic Signature(s) Signed: 08/23/2019 5:46:37 PM By: Deon Pilling Signed: 08/23/2019 6:07:56 PM By: Baruch Gouty RN, BSN Entered By: Deon Pilling on 08/23/2019 15:40:48 -------------------------------------------------------------------------------- Vitals Details Patient Name: Date of Service: [name] 08/23/2019 3:15 PM Medical Record [id number] Patient Account Number: [id number] Date of Birth/Sex: Treating RN: 1932/12/05 (86 y.o. [name] Primary Care Anjali Manzella: [name] Other Clinician: Referring Evertte Sones: Treating Jaasia Viglione/Extender:[name], [name], [name] Weeks in Treatment: 3 Vital Signs Time Taken: 15:37 Temperature (F): 97.5 Height (in): 71 Pulse (bpm): 61 Weight (lbs): 283 Respiratory Rate (breaths/min): 20  Body Mass Index (BMI): 39.5 Blood Pressure (mmHg): 177/75 Reference Range: 80 - 120 mg / dl Electronic Signature(s) Signed: 08/30/2019 9:20:10 AM By: Sandre Kitty Entered By: Sandre Kitty on 08/23/2019 15:38:36

## 2019-09-06 DIAGNOSIS — M25561 Pain in right knee: Secondary | ICD-10-CM | POA: Diagnosis not present

## 2019-09-09 DIAGNOSIS — M86071 Acute hematogenous osteomyelitis, right ankle and foot: Secondary | ICD-10-CM | POA: Diagnosis not present

## 2019-09-09 DIAGNOSIS — M2352 Chronic instability of knee, left knee: Secondary | ICD-10-CM | POA: Diagnosis not present

## 2019-09-09 DIAGNOSIS — M25662 Stiffness of left knee, not elsewhere classified: Secondary | ICD-10-CM | POA: Diagnosis not present

## 2019-09-09 DIAGNOSIS — M2351 Chronic instability of knee, right knee: Secondary | ICD-10-CM | POA: Diagnosis not present

## 2019-09-09 DIAGNOSIS — I10 Essential (primary) hypertension: Secondary | ICD-10-CM | POA: Diagnosis not present

## 2019-09-09 DIAGNOSIS — I872 Venous insufficiency (chronic) (peripheral): Secondary | ICD-10-CM | POA: Diagnosis not present

## 2019-09-09 DIAGNOSIS — M7541 Impingement syndrome of right shoulder: Secondary | ICD-10-CM | POA: Diagnosis not present

## 2019-09-09 DIAGNOSIS — M25661 Stiffness of right knee, not elsewhere classified: Secondary | ICD-10-CM | POA: Diagnosis not present

## 2019-09-09 DIAGNOSIS — G8929 Other chronic pain: Secondary | ICD-10-CM | POA: Diagnosis not present

## 2019-09-12 DIAGNOSIS — G8929 Other chronic pain: Secondary | ICD-10-CM | POA: Diagnosis not present

## 2019-09-12 DIAGNOSIS — M86071 Acute hematogenous osteomyelitis, right ankle and foot: Secondary | ICD-10-CM | POA: Diagnosis not present

## 2019-09-12 DIAGNOSIS — M25662 Stiffness of left knee, not elsewhere classified: Secondary | ICD-10-CM | POA: Diagnosis not present

## 2019-09-12 DIAGNOSIS — M7541 Impingement syndrome of right shoulder: Secondary | ICD-10-CM | POA: Diagnosis not present

## 2019-09-12 DIAGNOSIS — M25661 Stiffness of right knee, not elsewhere classified: Secondary | ICD-10-CM | POA: Diagnosis not present

## 2019-09-12 DIAGNOSIS — M2352 Chronic instability of knee, left knee: Secondary | ICD-10-CM | POA: Diagnosis not present

## 2019-09-12 DIAGNOSIS — I872 Venous insufficiency (chronic) (peripheral): Secondary | ICD-10-CM | POA: Diagnosis not present

## 2019-09-12 DIAGNOSIS — I10 Essential (primary) hypertension: Secondary | ICD-10-CM | POA: Diagnosis not present

## 2019-09-12 DIAGNOSIS — M2351 Chronic instability of knee, right knee: Secondary | ICD-10-CM | POA: Diagnosis not present

## 2019-09-13 ENCOUNTER — Encounter (HOSPITAL_BASED_OUTPATIENT_CLINIC_OR_DEPARTMENT_OTHER): Payer: Medicare PPO | Admitting: Physician Assistant

## 2019-09-15 DIAGNOSIS — M2352 Chronic instability of knee, left knee: Secondary | ICD-10-CM | POA: Diagnosis not present

## 2019-09-15 DIAGNOSIS — M86071 Acute hematogenous osteomyelitis, right ankle and foot: Secondary | ICD-10-CM | POA: Diagnosis not present

## 2019-09-15 DIAGNOSIS — M25661 Stiffness of right knee, not elsewhere classified: Secondary | ICD-10-CM | POA: Diagnosis not present

## 2019-09-15 DIAGNOSIS — M25662 Stiffness of left knee, not elsewhere classified: Secondary | ICD-10-CM | POA: Diagnosis not present

## 2019-09-15 DIAGNOSIS — M2351 Chronic instability of knee, right knee: Secondary | ICD-10-CM | POA: Diagnosis not present

## 2019-09-15 DIAGNOSIS — I10 Essential (primary) hypertension: Secondary | ICD-10-CM | POA: Diagnosis not present

## 2019-09-15 DIAGNOSIS — G8929 Other chronic pain: Secondary | ICD-10-CM | POA: Diagnosis not present

## 2019-09-15 DIAGNOSIS — M7541 Impingement syndrome of right shoulder: Secondary | ICD-10-CM | POA: Diagnosis not present

## 2019-09-15 DIAGNOSIS — I872 Venous insufficiency (chronic) (peripheral): Secondary | ICD-10-CM | POA: Diagnosis not present

## 2019-09-19 DIAGNOSIS — I10 Essential (primary) hypertension: Secondary | ICD-10-CM | POA: Diagnosis not present

## 2019-09-19 DIAGNOSIS — M2351 Chronic instability of knee, right knee: Secondary | ICD-10-CM | POA: Diagnosis not present

## 2019-09-19 DIAGNOSIS — M86071 Acute hematogenous osteomyelitis, right ankle and foot: Secondary | ICD-10-CM | POA: Diagnosis not present

## 2019-09-19 DIAGNOSIS — G8929 Other chronic pain: Secondary | ICD-10-CM | POA: Diagnosis not present

## 2019-09-19 DIAGNOSIS — M25662 Stiffness of left knee, not elsewhere classified: Secondary | ICD-10-CM | POA: Diagnosis not present

## 2019-09-19 DIAGNOSIS — I872 Venous insufficiency (chronic) (peripheral): Secondary | ICD-10-CM | POA: Diagnosis not present

## 2019-09-19 DIAGNOSIS — M25661 Stiffness of right knee, not elsewhere classified: Secondary | ICD-10-CM | POA: Diagnosis not present

## 2019-09-19 DIAGNOSIS — M2352 Chronic instability of knee, left knee: Secondary | ICD-10-CM | POA: Diagnosis not present

## 2019-09-19 DIAGNOSIS — M7541 Impingement syndrome of right shoulder: Secondary | ICD-10-CM | POA: Diagnosis not present

## 2019-09-20 ENCOUNTER — Other Ambulatory Visit: Payer: Self-pay | Admitting: Infectious Diseases

## 2019-09-20 DIAGNOSIS — L02611 Cutaneous abscess of right foot: Secondary | ICD-10-CM

## 2019-09-22 DIAGNOSIS — M25661 Stiffness of right knee, not elsewhere classified: Secondary | ICD-10-CM | POA: Diagnosis not present

## 2019-09-22 DIAGNOSIS — I10 Essential (primary) hypertension: Secondary | ICD-10-CM | POA: Diagnosis not present

## 2019-09-22 DIAGNOSIS — M86071 Acute hematogenous osteomyelitis, right ankle and foot: Secondary | ICD-10-CM | POA: Diagnosis not present

## 2019-09-22 DIAGNOSIS — M2351 Chronic instability of knee, right knee: Secondary | ICD-10-CM | POA: Diagnosis not present

## 2019-09-22 DIAGNOSIS — I872 Venous insufficiency (chronic) (peripheral): Secondary | ICD-10-CM | POA: Diagnosis not present

## 2019-09-22 DIAGNOSIS — M7541 Impingement syndrome of right shoulder: Secondary | ICD-10-CM | POA: Diagnosis not present

## 2019-09-22 DIAGNOSIS — M25662 Stiffness of left knee, not elsewhere classified: Secondary | ICD-10-CM | POA: Diagnosis not present

## 2019-09-22 DIAGNOSIS — G8929 Other chronic pain: Secondary | ICD-10-CM | POA: Diagnosis not present

## 2019-09-22 DIAGNOSIS — M2352 Chronic instability of knee, left knee: Secondary | ICD-10-CM | POA: Diagnosis not present

## 2019-09-26 DIAGNOSIS — M7541 Impingement syndrome of right shoulder: Secondary | ICD-10-CM | POA: Diagnosis not present

## 2019-09-26 DIAGNOSIS — M86071 Acute hematogenous osteomyelitis, right ankle and foot: Secondary | ICD-10-CM | POA: Diagnosis not present

## 2019-09-26 DIAGNOSIS — M2352 Chronic instability of knee, left knee: Secondary | ICD-10-CM | POA: Diagnosis not present

## 2019-09-26 DIAGNOSIS — G8929 Other chronic pain: Secondary | ICD-10-CM | POA: Diagnosis not present

## 2019-09-26 DIAGNOSIS — M2351 Chronic instability of knee, right knee: Secondary | ICD-10-CM | POA: Diagnosis not present

## 2019-09-26 DIAGNOSIS — M25662 Stiffness of left knee, not elsewhere classified: Secondary | ICD-10-CM | POA: Diagnosis not present

## 2019-09-26 DIAGNOSIS — M25661 Stiffness of right knee, not elsewhere classified: Secondary | ICD-10-CM | POA: Diagnosis not present

## 2019-09-26 DIAGNOSIS — I872 Venous insufficiency (chronic) (peripheral): Secondary | ICD-10-CM | POA: Diagnosis not present

## 2019-09-26 DIAGNOSIS — I10 Essential (primary) hypertension: Secondary | ICD-10-CM | POA: Diagnosis not present

## 2019-09-28 DIAGNOSIS — L120 Bullous pemphigoid: Secondary | ICD-10-CM | POA: Diagnosis not present

## 2019-09-28 DIAGNOSIS — Z79899 Other long term (current) drug therapy: Secondary | ICD-10-CM | POA: Diagnosis not present

## 2019-09-28 DIAGNOSIS — Z85828 Personal history of other malignant neoplasm of skin: Secondary | ICD-10-CM | POA: Diagnosis not present

## 2019-09-29 DIAGNOSIS — M25662 Stiffness of left knee, not elsewhere classified: Secondary | ICD-10-CM | POA: Diagnosis not present

## 2019-09-29 DIAGNOSIS — M25661 Stiffness of right knee, not elsewhere classified: Secondary | ICD-10-CM | POA: Diagnosis not present

## 2019-09-29 DIAGNOSIS — M2352 Chronic instability of knee, left knee: Secondary | ICD-10-CM | POA: Diagnosis not present

## 2019-09-29 DIAGNOSIS — G8929 Other chronic pain: Secondary | ICD-10-CM | POA: Diagnosis not present

## 2019-09-29 DIAGNOSIS — M7541 Impingement syndrome of right shoulder: Secondary | ICD-10-CM | POA: Diagnosis not present

## 2019-09-29 DIAGNOSIS — I872 Venous insufficiency (chronic) (peripheral): Secondary | ICD-10-CM | POA: Diagnosis not present

## 2019-09-29 DIAGNOSIS — M2351 Chronic instability of knee, right knee: Secondary | ICD-10-CM | POA: Diagnosis not present

## 2019-09-29 DIAGNOSIS — M86071 Acute hematogenous osteomyelitis, right ankle and foot: Secondary | ICD-10-CM | POA: Diagnosis not present

## 2019-09-29 DIAGNOSIS — I10 Essential (primary) hypertension: Secondary | ICD-10-CM | POA: Diagnosis not present

## 2019-10-04 DIAGNOSIS — M2352 Chronic instability of knee, left knee: Secondary | ICD-10-CM | POA: Diagnosis not present

## 2019-10-04 DIAGNOSIS — G8929 Other chronic pain: Secondary | ICD-10-CM | POA: Diagnosis not present

## 2019-10-04 DIAGNOSIS — M25662 Stiffness of left knee, not elsewhere classified: Secondary | ICD-10-CM | POA: Diagnosis not present

## 2019-10-04 DIAGNOSIS — M25661 Stiffness of right knee, not elsewhere classified: Secondary | ICD-10-CM | POA: Diagnosis not present

## 2019-10-04 DIAGNOSIS — M86071 Acute hematogenous osteomyelitis, right ankle and foot: Secondary | ICD-10-CM | POA: Diagnosis not present

## 2019-10-04 DIAGNOSIS — I872 Venous insufficiency (chronic) (peripheral): Secondary | ICD-10-CM | POA: Diagnosis not present

## 2019-10-04 DIAGNOSIS — M7541 Impingement syndrome of right shoulder: Secondary | ICD-10-CM | POA: Diagnosis not present

## 2019-10-04 DIAGNOSIS — M2351 Chronic instability of knee, right knee: Secondary | ICD-10-CM | POA: Diagnosis not present

## 2019-10-04 DIAGNOSIS — I10 Essential (primary) hypertension: Secondary | ICD-10-CM | POA: Diagnosis not present

## 2019-10-09 DIAGNOSIS — M25661 Stiffness of right knee, not elsewhere classified: Secondary | ICD-10-CM | POA: Diagnosis not present

## 2019-10-09 DIAGNOSIS — M2351 Chronic instability of knee, right knee: Secondary | ICD-10-CM | POA: Diagnosis not present

## 2019-10-09 DIAGNOSIS — G8929 Other chronic pain: Secondary | ICD-10-CM | POA: Diagnosis not present

## 2019-10-09 DIAGNOSIS — M7541 Impingement syndrome of right shoulder: Secondary | ICD-10-CM | POA: Diagnosis not present

## 2019-10-09 DIAGNOSIS — M25662 Stiffness of left knee, not elsewhere classified: Secondary | ICD-10-CM | POA: Diagnosis not present

## 2019-10-09 DIAGNOSIS — J301 Allergic rhinitis due to pollen: Secondary | ICD-10-CM | POA: Diagnosis not present

## 2019-10-09 DIAGNOSIS — M86071 Acute hematogenous osteomyelitis, right ankle and foot: Secondary | ICD-10-CM | POA: Diagnosis not present

## 2019-10-09 DIAGNOSIS — I10 Essential (primary) hypertension: Secondary | ICD-10-CM | POA: Diagnosis not present

## 2019-10-09 DIAGNOSIS — I872 Venous insufficiency (chronic) (peripheral): Secondary | ICD-10-CM | POA: Diagnosis not present

## 2019-10-09 DIAGNOSIS — M2352 Chronic instability of knee, left knee: Secondary | ICD-10-CM | POA: Diagnosis not present

## 2019-10-09 NOTE — Progress Notes (Signed)
DEIGO, KINSMAN ([id number]) Visit Report for 08/30/2019 Arrival Information Details Patient Name: Date of Service: San Jetty 08/30/2019 1:15 PM Medical Record Number: [id number] Patient Account Number: [id number] Date of Birth/Sex: Treating RN: [birth year] (86 y.o. [name] Primary Care Brenin Heidelberger: [name] Other Clinician: Referring Dailynn Nancarrow: Treating Yzabella Crunk/Extender: Nelda Severe Weeks in Treatment: 4 Visit Information History Since Last Visit Added or deleted any medications: No Patient Arrived: Walker Any new allergies or adverse reactions: No Arrival Time: 13:47 Had a fall or experienced change in No Accompanied By: self activities of daily living that may affect Transfer Assistance: None risk of falls: Patient Identification Verified: Yes Signs or symptoms of abuse/neglect since last visito No Secondary Verification Process Completed: Yes Hospitalized since last visit: No Patient Requires Transmission-Based Precautions: No Implantable device outside of the clinic excluding No Patient Has Alerts: No cellular tissue based products placed in the center since last visit: Has Dressing in Place as Prescribed: Yes Pain Present Now: No Electronic Signature(s) Signed: 08/30/2019 2:20:25 PM By: Sandre Kitty Entered By: Sandre Kitty on 08/30/2019 13:48:19 -------------------------------------------------------------------------------- Clinic Level of Care Assessment Details Patient Name: Date of Service: San Jetty 08/30/2019 1:15 PM Medical Record Number: [id number] Patient Account Number: [id number] Date of Birth/Sex: Treating RN: January 08, 1933 (86 y.o. [name] Primary Care [place]ong: [name] Other Clinician: Referring Leisel Pinette: Treating Aldyn Toon/Extender: Fredderick Erb, [name] Weeks in Treatment: 4 Clinic Level of Care Assessment Items TOOL 4 Quantity Score []  - 0 Use when  only an EandM is performed on FOLLOW-UP visit ASSESSMENTS - Nursing Assessment / Reassessment X- 1 10 Reassessment of Co-morbidities (includes updates in patient status) X- 1 5 Reassessment of Adherence to Treatment Plan ASSESSMENTS - Wound and Skin A ssessment / Reassessment X - Simple Wound Assessment / Reassessment - one wound 1 5 []  - 0 Complex Wound Assessment / Reassessment - multiple wounds []  - 0 Dermatologic / Skin Assessment (not related to wound area) ASSESSMENTS - Focused Assessment []  - 0 Circumferential Edema Measurements - multi extremities []  - 0 Nutritional Assessment / Counseling / Intervention X- 1 5 Lower Extremity Assessment (monofilament, tuning fork, pulses) []  - 0 Peripheral Arterial Disease Assessment (using hand held doppler) ASSESSMENTS - Ostomy and/or Continence Assessment and Care []  - 0 Incontinence Assessment and Management []  - 0 Ostomy Care Assessment and Management (repouching, etc.) PROCESS - Coordination of Care X - Simple Patient / Family Education for ongoing care 1 15 []  - 0 Complex (extensive) Patient / Family Education for ongoing care X- 1 10 Staff obtains Programmer, systems, Records, T Results / Process Orders est []  - 0 Staff telephones HHA, Nursing Homes / Clarify orders / etc []  - 0 Routine Transfer to another Facility (non-emergent condition) []  - 0 Routine Hospital Admission (non-emergent condition) []  - 0 New Admissions / Biomedical engineer / Ordering NPWT Apligraf, etc. , []  - 0 Emergency Hospital Admission (emergent condition) X- 1 10 Simple Discharge Coordination []  - 0 Complex (extensive) Discharge Coordination PROCESS - Special Needs []  - 0 Pediatric / Minor Patient Management []  - 0 Isolation Patient Management []  - 0 Hearing / Language / Visual special needs []  - 0 Assessment of Community assistance (transportation, D/C planning, etc.) []  - 0 Additional assistance / Altered mentation []  - 0 Support  Surface(s) Assessment (bed, cushion, seat, etc.) INTERVENTIONS - Wound Cleansing / Measurement X - Simple Wound Cleansing - one wound 1 5 []  -  0 Complex Wound Cleansing - multiple wounds X- 1 5 Wound Imaging (photographs - any number of wounds) []  - 0 Wound Tracing (instead of photographs) X- 1 5 Simple Wound Measurement - one wound []  - 0 Complex Wound Measurement - multiple wounds INTERVENTIONS - Wound Dressings X - Small Wound Dressing one or multiple wounds 1 10 []  - 0 Medium Wound Dressing one or multiple wounds []  - 0 Large Wound Dressing one or multiple wounds []  - 0 Application of Medications - topical []  - 0 Application of Medications - injection INTERVENTIONS - Miscellaneous []  - 0 External ear exam []  - 0 Specimen Collection (cultures, biopsies, blood, body fluids, etc.) []  - 0 Specimen(s) / Culture(s) sent or taken to Lab for analysis []  - 0 Patient Transfer (multiple staff / Civil Service fast streamer / Similar devices) []  - 0 Simple Staple / Suture removal (25 or less) []  - 0 Complex Staple / Suture removal (26 or more) []  - 0 Hypo / Hyperglycemic Management (close monitor of Blood Glucose) []  - 0 Ankle / Brachial Index (ABI) - do not check if billed separately X- 1 5 Vital Signs Has the patient been seen at the hospital within the last three years: Yes Total Score: 90 Level Of Care: New/Established - Level 3 Electronic Signature(s) Signed: 08/30/2019 5:43:24 PM By: Baruch Gouty RN, BSN Entered By: Baruch Gouty on 08/30/2019 14:54:51 -------------------------------------------------------------------------------- Encounter Discharge Information Details Patient Name: Date of Service: Marya Landry M L. 08/30/2019 1:15 PM Medical Record Number: [id number] Patient Account Number: [id number] Date of Birth/Sex: Treating RN: September 05, 1932 (86 y.o. [name] Primary Care Javeria Briski: [name] Other Clinician: Referring Birdena Kingma: Treating  Tonilynn Bieker/Extender: Maryruth Eve in Treatment: 4 Encounter Discharge Information Items Discharge Condition: Stable Ambulatory Status: Walker Discharge Destination: Home Transportation: Private Auto Accompanied By: self Schedule Follow-up Appointment: Yes Clinical Summary of Care: Patient Declined Electronic Signature(s) Signed: 08/30/2019 5:43:24 PM By: Baruch Gouty RN, BSN Entered By: Baruch Gouty on 08/30/2019 15:02:21 -------------------------------------------------------------------------------- Lower Extremity Assessment Details Patient Name: Date of Service: Gretta Arab L. 08/30/2019 1:15 PM Medical Record Number: [id number] Patient Account Number: [id number] Date of Birth/Sex: Treating RN: 1932/08/12 (86 y.o. Hessie Diener Primary Care Kalen Ratajczak: [name] Other Clinician: Referring Kahmya Pinkham: Treating Srinidhi Landers/Extender: Fredderick Erb, [name] Weeks in Treatment: 4 Edema Assessment Assessed: [Left: No] [Right: Yes] Edema: [Left: No] [Right: Yes] Calf Left: Right: Point of Measurement: 32 cm From Medial Instep cm 47 cm Ankle Left: Right: Point of Measurement: 12 cm From Medial Instep cm 31 cm Vascular Assessment Pulses: Dorsalis Pedis Palpable: [Right:Yes] Electronic Signature(s) Signed: 10/09/2019 1:31:05 PM By: Deon Pilling Entered By: Deon Pilling on 08/30/2019 13:54:31 -------------------------------------------------------------------------------- Multi-Disciplinary Care Plan Details Patient Name: Date of Service: Marya Landry M L. 08/30/2019 1:15 PM Medical Record Number: [id number] Patient Account Number: [id number] Date of Birth/Sex: Treating RN: 09-22-1932 (86 y.o. [name] Primary Care Faris Coolman: [name] Other Clinician: Referring Kiptyn Rafuse: Treating Mersades Barbaro/Extender: Fredderick Erb, [name] Weeks in Treatment: 4 Active Inactive Electronic Signature(s) Signed:  08/30/2019 5:43:24 PM By: Baruch Gouty RN, BSN Entered By: Baruch Gouty on 08/30/2019 14:53:57 -------------------------------------------------------------------------------- Pain Assessment Details Patient Name: Date of Service: Marya Landry M L. 08/30/2019 1:15 PM Medical Record Number: [id number] Patient Account Number: [id number] Date of Birth/Sex: Treating RN: 01-14-1933 (86 y.o. [name] Primary Care Kristalynn Coddington: Patrina Levering Other Clinician: Referring Tyashia Morrisette: Treating Kaysia Willard/Extender: Melburn Hake,  Hoyt Stoneking, [name] Weeks in Treatment: 4 Active Problems Location of Pain Severity and Description of Pain Patient Has Paino No Site Locations Pain Management and Medication Current Pain Management: Electronic Signature(s) Signed: 08/30/2019 2:20:25 PM By: Sandre Kitty Signed: 08/30/2019 5:43:24 PM By: Baruch Gouty RN, BSN Entered By: Sandre Kitty on 08/30/2019 13:48:37 -------------------------------------------------------------------------------- Patient/Caregiver Education Details Patient Name: Date of Service: San Jetty 4/14/2021andnbsp1:15 PM Medical Record Number: [id number] Patient Account Number: [id number] Date of Birth/Gender: Treating RN: Aug 23, 1932 (86 y.o. [name] Primary Care Physician: [name] Other Clinician: Referring Physician: Treating Physician/Extender: Maryruth Eve in Treatment: 4 Education Assessment Education Provided To: Patient Education Topics Provided Wound/Skin Impairment: Methods: Explain/Verbal Responses: Reinforcements needed, State content correctly Electronic Signature(s) Signed: 08/30/2019 5:43:24 PM By: Baruch Gouty RN, BSN Entered By: Baruch Gouty on 08/30/2019 14:53:32 -------------------------------------------------------------------------------- Wound Assessment Details Patient Name: Date of Service: Gretta Arab L.  08/30/2019 1:15 PM Medical Record Number: [id number] Patient Account Number: [id number] Date of Birth/Sex: Treating RN: 1933-04-21 (86 y.o. [name] Primary Care Izen Petz: [name] Other Clinician: Referring Coburn Knaus: Treating Ting Cage/Extender: Fredderick Erb, [name] Weeks in Treatment: 4 Wound Status Wound Number: 1 Primary Neuropathic Ulcer-Non Diabetic Etiology: Wound Location: Right T Third oe Wound Status: Healed - Epithelialized Wounding Event: Blister Comorbid Asthma, Hypertension, Osteoarthritis, Osteomyelitis, Date Acquired: 06/02/2019 History: Neuropathy Weeks Of Treatment: 4 Clustered Wound: No Photos Photo Uploaded By: Mikeal Hawthorne on 09/06/2019 12:06:29 Wound Measurements Length: (cm) Width: (cm) Depth: (cm) Area: (cm) Volume: (cm) 0 % Reduction in Area: 100% 0 % Reduction in Volume: 100% 0 Epithelialization: Large (67-100%) 0 Tunneling: No 0 Undermining: No Wound Description Classification: Full Thickness Without Exposed Support Structures Wound Margin: Flat and Intact Exudate Amount: None Present Foul Odor After Cleansing: No Slough/Fibrino No Wound Bed Granulation Amount: None Present (0%) Exposed Structure Necrotic Amount: None Present (0%) Fascia Exposed: No Fat Layer (Subcutaneous Tissue) Exposed: Yes Tendon Exposed: No Muscle Exposed: No Joint Exposed: No Bone Exposed: No Electronic Signature(s) Signed: 08/30/2019 5:43:24 PM By: Baruch Gouty RN, BSN Entered By: Baruch Gouty on 08/30/2019 14:55:15 -------------------------------------------------------------------------------- [place] Details Patient Name: Date of Service: Marya Landry M L. 08/30/2019 1:15 PM Medical Record Number: [id number] Patient Account Number: [id number] Date of Birth/Sex: Treating RN: 07-08-1932 (86 y.o. [name] Primary Care Pookela Sellin: [name] Other Clinician: Referring Misheel Gowans: Treating  Kaylie Ritter/Extender: Fredderick Erb, [name] Weeks in Treatment: 4 Vital Signs Time Taken: 13:48 Temperature (F): 98.6 Height (in): 71 Pulse (bpm): 61 Weight (lbs): 283 Respiratory Rate (breaths/min): 20 Body Mass Index (BMI): 39.5 Blood Pressure (mmHg): 170/61 Reference Range: 80 - 120 mg / dl Electronic Signature(s) Signed: 08/30/2019 2:20:25 PM By: Sandre Kitty Entered By: Sandre Kitty on 08/30/2019 13:48:32

## 2019-10-10 DIAGNOSIS — I10 Essential (primary) hypertension: Secondary | ICD-10-CM | POA: Diagnosis not present

## 2019-10-10 DIAGNOSIS — I872 Venous insufficiency (chronic) (peripheral): Secondary | ICD-10-CM | POA: Diagnosis not present

## 2019-10-10 DIAGNOSIS — M2351 Chronic instability of knee, right knee: Secondary | ICD-10-CM | POA: Diagnosis not present

## 2019-10-10 DIAGNOSIS — M25662 Stiffness of left knee, not elsewhere classified: Secondary | ICD-10-CM | POA: Diagnosis not present

## 2019-10-10 DIAGNOSIS — M7541 Impingement syndrome of right shoulder: Secondary | ICD-10-CM | POA: Diagnosis not present

## 2019-10-10 DIAGNOSIS — G8929 Other chronic pain: Secondary | ICD-10-CM | POA: Diagnosis not present

## 2019-10-10 DIAGNOSIS — M2352 Chronic instability of knee, left knee: Secondary | ICD-10-CM | POA: Diagnosis not present

## 2019-10-10 DIAGNOSIS — M86071 Acute hematogenous osteomyelitis, right ankle and foot: Secondary | ICD-10-CM | POA: Diagnosis not present

## 2019-10-10 DIAGNOSIS — M25661 Stiffness of right knee, not elsewhere classified: Secondary | ICD-10-CM | POA: Diagnosis not present

## 2019-10-13 DIAGNOSIS — I10 Essential (primary) hypertension: Secondary | ICD-10-CM | POA: Diagnosis not present

## 2019-10-13 DIAGNOSIS — G8929 Other chronic pain: Secondary | ICD-10-CM | POA: Diagnosis not present

## 2019-10-13 DIAGNOSIS — M25662 Stiffness of left knee, not elsewhere classified: Secondary | ICD-10-CM | POA: Diagnosis not present

## 2019-10-13 DIAGNOSIS — M2351 Chronic instability of knee, right knee: Secondary | ICD-10-CM | POA: Diagnosis not present

## 2019-10-13 DIAGNOSIS — I872 Venous insufficiency (chronic) (peripheral): Secondary | ICD-10-CM | POA: Diagnosis not present

## 2019-10-13 DIAGNOSIS — M7541 Impingement syndrome of right shoulder: Secondary | ICD-10-CM | POA: Diagnosis not present

## 2019-10-13 DIAGNOSIS — M86071 Acute hematogenous osteomyelitis, right ankle and foot: Secondary | ICD-10-CM | POA: Diagnosis not present

## 2019-10-13 DIAGNOSIS — M2352 Chronic instability of knee, left knee: Secondary | ICD-10-CM | POA: Diagnosis not present

## 2019-10-13 DIAGNOSIS — M25661 Stiffness of right knee, not elsewhere classified: Secondary | ICD-10-CM | POA: Diagnosis not present

## 2019-10-15 ENCOUNTER — Inpatient Hospital Stay (HOSPITAL_COMMUNITY)
Admission: EM | Admit: 2019-10-15 | Discharge: 2019-10-18 | DRG: 603 | Disposition: A | Discharge status: Home Health | Payer: Medicare PPO | Attending: Internal Medicine | Admitting: Internal Medicine

## 2019-10-15 ENCOUNTER — Emergency Department (HOSPITAL_BASED_OUTPATIENT_CLINIC_OR_DEPARTMENT_OTHER)
Admit: 2019-10-15 | Discharge: 2019-10-15 | Disposition: A | Discharge status: Home / Self Care | Payer: Medicare PPO | Attending: Emergency Medicine | Admitting: Emergency Medicine

## 2019-10-15 ENCOUNTER — Encounter (HOSPITAL_COMMUNITY): Payer: Self-pay | Admitting: Emergency Medicine

## 2019-10-15 ENCOUNTER — Other Ambulatory Visit: Payer: Self-pay

## 2019-10-15 DIAGNOSIS — R609 Edema, unspecified: Secondary | ICD-10-CM | POA: Diagnosis not present

## 2019-10-15 DIAGNOSIS — L120 Bullous pemphigoid: Secondary | ICD-10-CM | POA: Diagnosis not present

## 2019-10-15 DIAGNOSIS — N179 Acute kidney failure, unspecified: Secondary | ICD-10-CM | POA: Diagnosis present

## 2019-10-15 DIAGNOSIS — I440 Atrioventricular block, first degree: Secondary | ICD-10-CM | POA: Diagnosis present

## 2019-10-15 DIAGNOSIS — E785 Hyperlipidemia, unspecified: Secondary | ICD-10-CM | POA: Diagnosis present

## 2019-10-15 DIAGNOSIS — F419 Anxiety disorder, unspecified: Secondary | ICD-10-CM | POA: Diagnosis present

## 2019-10-15 DIAGNOSIS — Z96612 Presence of left artificial shoulder joint: Secondary | ICD-10-CM | POA: Diagnosis present

## 2019-10-15 DIAGNOSIS — Z885 Allergy status to narcotic agent status: Secondary | ICD-10-CM | POA: Diagnosis not present

## 2019-10-15 DIAGNOSIS — Z6839 Body mass index (BMI) 39.0-39.9, adult: Secondary | ICD-10-CM

## 2019-10-15 DIAGNOSIS — I493 Ventricular premature depolarization: Secondary | ICD-10-CM | POA: Diagnosis present

## 2019-10-15 DIAGNOSIS — Z96643 Presence of artificial hip joint, bilateral: Secondary | ICD-10-CM | POA: Diagnosis present

## 2019-10-15 DIAGNOSIS — Z79899 Other long term (current) drug therapy: Secondary | ICD-10-CM

## 2019-10-15 DIAGNOSIS — M1990 Unspecified osteoarthritis, unspecified site: Secondary | ICD-10-CM | POA: Diagnosis present

## 2019-10-15 DIAGNOSIS — L03115 Cellulitis of right lower limb: Principal | ICD-10-CM | POA: Diagnosis present

## 2019-10-15 DIAGNOSIS — Z96653 Presence of artificial knee joint, bilateral: Secondary | ICD-10-CM | POA: Diagnosis present

## 2019-10-15 DIAGNOSIS — Z87891 Personal history of nicotine dependence: Secondary | ICD-10-CM

## 2019-10-15 DIAGNOSIS — I872 Venous insufficiency (chronic) (peripheral): Secondary | ICD-10-CM | POA: Diagnosis not present

## 2019-10-15 DIAGNOSIS — Z888 Allergy status to other drugs, medicaments and biological substances status: Secondary | ICD-10-CM

## 2019-10-15 DIAGNOSIS — Z20822 Contact with and (suspected) exposure to covid-19: Secondary | ICD-10-CM | POA: Diagnosis present

## 2019-10-15 DIAGNOSIS — M7989 Other specified soft tissue disorders: Secondary | ICD-10-CM | POA: Diagnosis not present

## 2019-10-15 DIAGNOSIS — Z03818 Encounter for observation for suspected exposure to other biological agents ruled out: Secondary | ICD-10-CM | POA: Diagnosis not present

## 2019-10-15 DIAGNOSIS — M47816 Spondylosis without myelopathy or radiculopathy, lumbar region: Secondary | ICD-10-CM | POA: Diagnosis present

## 2019-10-15 DIAGNOSIS — Z8249 Family history of ischemic heart disease and other diseases of the circulatory system: Secondary | ICD-10-CM

## 2019-10-15 DIAGNOSIS — I4510 Unspecified right bundle-branch block: Secondary | ICD-10-CM | POA: Diagnosis not present

## 2019-10-15 DIAGNOSIS — I10 Essential (primary) hypertension: Secondary | ICD-10-CM | POA: Diagnosis present

## 2019-10-15 DIAGNOSIS — N400 Enlarged prostate without lower urinary tract symptoms: Secondary | ICD-10-CM | POA: Diagnosis present

## 2019-10-15 LAB — COMPREHENSIVE METABOLIC PANEL
ALT: 42 U/L (ref 0–44)
AST: 32 U/L (ref 15–41)
Albumin: 3.3 g/dL — ABNORMAL LOW (ref 3.5–5.0)
Alkaline Phosphatase: 57 U/L (ref 38–126)
Anion gap: 8 (ref 5–15)
BUN: 38 mg/dL — ABNORMAL HIGH (ref 8–23)
CO2: 26 mmol/L (ref 22–32)
Calcium: 8.9 mg/dL (ref 8.9–10.3)
Chloride: 100 mmol/L (ref 98–111)
Creatinine, Ser: 1.43 mg/dL — ABNORMAL HIGH (ref 0.61–1.24)
GFR calc Af Amer: 52 mL/min — ABNORMAL LOW (ref >60)
GFR calc non Af Amer: 45 mL/min — ABNORMAL LOW (ref >60)
Glucose, Bld: 124 mg/dL — ABNORMAL HIGH (ref 70–99)
Potassium: 4.7 mmol/L (ref 3.5–5.1)
Sodium: 134 mmol/L — ABNORMAL LOW (ref 135–145)
Total Bilirubin: 0.9 mg/dL (ref 0.3–1.2)
Total Protein: 6.6 g/dL (ref 6.5–8.1)

## 2019-10-15 LAB — SARS CORONAVIRUS 2 BY RT PCR (HOSPITAL ORDER, PERFORMED IN ~~LOC~~ HOSPITAL LAB): SARS Coronavirus 2: NEGATIVE

## 2019-10-15 LAB — CBC WITH DIFFERENTIAL/PLATELET
Abs Immature Granulocytes: 0.03 10*3/uL (ref 0.00–0.07)
Basophils Absolute: 0 10*3/uL (ref 0.0–0.1)
Basophils Relative: 0 %
Eosinophils Absolute: 0.1 10*3/uL (ref 0.0–0.5)
Eosinophils Relative: 1 %
HCT: 35.7 % — ABNORMAL LOW (ref 39.0–52.0)
Hemoglobin: 11.9 g/dL — ABNORMAL LOW (ref 13.0–17.0)
Immature Granulocytes: 1 %
Lymphocytes Relative: 12 %
Lymphs Abs: 0.8 10*3/uL (ref 0.7–4.0)
MCH: 35 pg — ABNORMAL HIGH (ref 26.0–34.0)
MCHC: 33.3 g/dL (ref 30.0–36.0)
MCV: 105 fL — ABNORMAL HIGH (ref 80.0–100.0)
Monocytes Absolute: 0.4 10*3/uL (ref 0.1–1.0)
Monocytes Relative: 6 %
Neutro Abs: 5.2 10*3/uL (ref 1.7–7.7)
Neutrophils Relative %: 80 %
Platelets: 162 10*3/uL (ref 150–400)
RBC: 3.4 MIL/uL — ABNORMAL LOW (ref 4.22–5.81)
RDW: 13.6 % (ref 11.5–15.5)
WBC: 6.5 10*3/uL (ref 4.0–10.5)
nRBC: 0 % (ref 0.0–0.2)

## 2019-10-15 LAB — HIV ANTIBODY (ROUTINE TESTING W REFLEX): HIV Screen 4th Generation wRfx: NONREACTIVE

## 2019-10-15 MED ORDER — ADULT MULTIVITAMIN W/MINERALS CH
1.0000 | ORAL_TABLET | Freq: Every day | ORAL | Status: DC
  Administered 2019-10-16 – 2019-10-18 (×3): 1 via ORAL
  Filled 2019-10-15 (×3): qty 1

## 2019-10-15 MED ORDER — METOPROLOL SUCCINATE ER 50 MG PO TB24
150.0000 mg | ORAL_TABLET | Freq: Every day | ORAL | Status: DC
  Administered 2019-10-15 – 2019-10-18 (×4): 150 mg via ORAL
  Filled 2019-10-15 (×4): qty 3

## 2019-10-15 MED ORDER — TRAMADOL HCL 50 MG PO TABS: 50.0000 mg | ORAL_TABLET | Freq: Four times a day (QID) | ORAL | Status: DC | PRN

## 2019-10-15 MED ORDER — SODIUM CHLORIDE 0.9 % IV SOLN
1.0000 g | Freq: Once | INTRAVENOUS | Status: AC
  Administered 2019-10-15: 1 g via INTRAVENOUS
  Filled 2019-10-15: qty 1

## 2019-10-15 MED ORDER — ACETAMINOPHEN 650 MG RE SUPP: 650.0000 mg | Freq: Four times a day (QID) | RECTAL | Status: DC | PRN

## 2019-10-15 MED ORDER — ATORVASTATIN CALCIUM 10 MG PO TABS
10.0000 mg | ORAL_TABLET | Freq: Every day | ORAL | Status: DC
  Administered 2019-10-16 – 2019-10-17 (×2): 10 mg via ORAL
  Filled 2019-10-15 (×5): qty 1

## 2019-10-15 MED ORDER — VANCOMYCIN HCL IN DEXTROSE 1-5 GM/200ML-% IV SOLN
1000.0000 mg | Freq: Once | INTRAVENOUS | Status: DC
  Administered 2019-10-15: 1000 mg via INTRAVENOUS
  Filled 2019-10-15: qty 200

## 2019-10-15 MED ORDER — FOLIC ACID 1 MG PO TABS
1.0000 mg | ORAL_TABLET | Freq: Every day | ORAL | Status: DC
  Administered 2019-10-16 – 2019-10-18 (×3): 1 mg via ORAL
  Filled 2019-10-15 (×3): qty 1

## 2019-10-15 MED ORDER — VITAMIN D 25 MCG (1000 UNIT) PO TABS
5000.0000 [IU] | ORAL_TABLET | Freq: Every day | ORAL | Status: DC
  Administered 2019-10-16 – 2019-10-18 (×3): 5000 [IU] via ORAL
  Filled 2019-10-15 (×3): qty 5

## 2019-10-15 MED ORDER — POLYETHYLENE GLYCOL 3350 17 G PO PACK: 17.0000 g | PACK | Freq: Every day | ORAL | Status: DC | PRN

## 2019-10-15 MED ORDER — CLONIDINE HCL 0.1 MG PO TABS
0.2000 mg | ORAL_TABLET | Freq: Two times a day (BID) | ORAL | Status: DC
  Administered 2019-10-15 – 2019-10-18 (×6): 0.2 mg via ORAL
  Filled 2019-10-15 (×6): qty 2

## 2019-10-15 MED ORDER — OMEGA-3-ACID ETHYL ESTERS 1 G PO CAPS
1.0000 g | ORAL_CAPSULE | Freq: Two times a day (BID) | ORAL | Status: DC
  Administered 2019-10-15 – 2019-10-18 (×6): 1 g via ORAL
  Filled 2019-10-15 (×6): qty 1

## 2019-10-15 MED ORDER — AMLODIPINE BESYLATE 5 MG PO TABS
2.5000 mg | ORAL_TABLET | Freq: Every day | ORAL | Status: DC
  Administered 2019-10-16 – 2019-10-18 (×3): 2.5 mg via ORAL
  Filled 2019-10-15 (×3): qty 1

## 2019-10-15 MED ORDER — SODIUM CHLORIDE 0.9 % IV SOLN
2.0000 g | INTRAVENOUS | Status: DC
  Administered 2019-10-16 – 2019-10-18 (×3): 2 g via INTRAVENOUS
  Filled 2019-10-15 (×3): qty 2

## 2019-10-15 MED ORDER — ACETAMINOPHEN 325 MG PO TABS: 650.0000 mg | ORAL_TABLET | Freq: Four times a day (QID) | ORAL | Status: DC | PRN

## 2019-10-15 MED ORDER — SODIUM CHLORIDE 0.9 % IV SOLN
1.0000 g | Freq: Once | INTRAVENOUS | Status: AC
  Administered 2019-10-15: 1 g via INTRAVENOUS
  Filled 2019-10-15: qty 10

## 2019-10-15 MED ORDER — FINASTERIDE 5 MG PO TABS
5.0000 mg | ORAL_TABLET | Freq: Every day | ORAL | Status: DC
  Administered 2019-10-16 – 2019-10-18 (×3): 5 mg via ORAL
  Filled 2019-10-15 (×3): qty 1

## 2019-10-15 MED ORDER — SODIUM CHLORIDE 0.9 % IV SOLN: INTRAVENOUS | Status: AC

## 2019-10-15 MED ORDER — ENOXAPARIN SODIUM 60 MG/0.6ML ~~LOC~~ SOLN
60.0000 mg | SUBCUTANEOUS | Status: DC
  Administered 2019-10-15 – 2019-10-17 (×3): 60 mg via SUBCUTANEOUS
  Filled 2019-10-15 (×3): qty 0.6

## 2019-10-15 MED ORDER — GABAPENTIN 300 MG PO CAPS
300.0000 mg | ORAL_CAPSULE | Freq: Three times a day (TID) | ORAL | Status: DC
  Administered 2019-10-15 – 2019-10-18 (×8): 300 mg via ORAL
  Filled 2019-10-15 (×8): qty 1

## 2019-10-15 MED ORDER — METHOTREXATE 2.5 MG PO TABS: 10.0000 mg | ORAL_TABLET | ORAL | Status: DC

## 2019-10-15 MED ORDER — ONDANSETRON HCL 4 MG/2ML IJ SOLN: 4.0000 mg | Freq: Four times a day (QID) | INTRAMUSCULAR | Status: DC | PRN

## 2019-10-15 MED ORDER — MONTELUKAST SODIUM 10 MG PO TABS: 10.0000 mg | ORAL_TABLET | Freq: Every day | ORAL | Status: DC

## 2019-10-15 MED ORDER — ONDANSETRON HCL 4 MG PO TABS: 4.0000 mg | ORAL_TABLET | Freq: Four times a day (QID) | ORAL | Status: DC | PRN

## 2019-10-15 NOTE — ED Triage Notes (Signed)
Patient here from home reporting sudden onset  right leg swelling, redness and pain that started last night. Seen at [facility]. Injury to the right knee "several weeks ago". Hx of venous insufficiency.

## 2019-10-15 NOTE — ED Notes (Signed)
Deniece Ree MD at bedside

## 2019-10-15 NOTE — Progress Notes (Signed)
A consult was received from an ED physician for vancomycin per pharmacy dosing.  The patient's profile has been reviewed for ht/wt/allergies/indication/available labs.   A one time order has been placed for vancomycin 1g.  Further antibiotics/pharmacy consults should be ordered by admitting physician if indicated.                       Thank you, Peggyann Juba, PharmD, BCPS Pharmacy: [phone] 10/15/2019  2:38 PM

## 2019-10-15 NOTE — H&P (Signed)
History and Physical    [name] [id number] DOB: 22-Oct-1932 DOA: 10/15/2019  Referring MD/NP/PA: Alfredia Client, ED PA PCP: Lajean Manes, MD  Patient coming from: Home  Chief Complaint: Right leg swelling and pain  HPI: [name] is a 86 y.o. male who has a past medical history that is significant for hypertension, hyperlipidemia, bullous pemphigoid, obesity and chronic venous insufficiency. He is on weekly methotrexate for his pemphigoid. Over the months of March and April he developed a blister over his third toe on the right, was seen by Dr. [name] with ID, was given 4 weekly doses of oritavancin and also completed therapy with Cipro and doxy. He states that over the past 2 days he has noticed that the edema that he typically has over his extremities at nighttime that goes away by morning has not been resolving on the right leg. Yesterday noticed redness of his leg, because of continued progression of edema, erythema and pain he decided to come to the hospital today. He has been afebrile. No leukocytosis. He does have some acute renal failure with a creatinine of 1.43. Doppler is negative for DVT. Admission has been requested for further evaluation and management.  Past Medical/Surgical History: Past Medical History:  Diagnosis Date  . Anxiety    "pt. denies" 10-11-12  . Arthritis    degenerative arthritis   . Arthritis, lumbar spine   . BPH (benign prostatic hyperplasia)   . Dysrhythmia    1st degree heart block  palpitations 04/07/2011, seen & complete eval. /w Dr. Radford Pax- stress, echo, holter monitor, has been cleared for surg.   . Edema of both legs    improved with use of meds/ compression socks used.  [facility] Kitchen Heart palpitations 2012  . Hypertension   . Pemphigoid   . PONV (postoperative nausea and vomiting)   . PVC's (premature ventricular contractions)   . Spinal stenosis     Past Surgical History:  Procedure Laterality Date  . BACK SURGERY  01/2011   02/2011  . JOINT REPLACEMENT     R knee replacement-  10/06, L shoulder - 3/07, R hip- replacement- '07  . TOTAL HIP ARTHROPLASTY  2007   right total hip replacement  . TOTAL HIP ARTHROPLASTY Left 10/14/2012   Procedure: LEFT TOTAL HIP ARTHROPLASTY ANTERIOR APPROACH;  Surgeon: Mcarthur Rossetti, MD;  Location: [facility];  Service: Orthopedics;  Laterality: Left;  . TOTAL KNEE ARTHROPLASTY  2007   right total knee  . TOTAL KNEE ARTHROPLASTY  06/01/2011   Procedure: TOTAL KNEE ARTHROPLASTY;  Surgeon: Lorn Junes, MD;  Location: [facility];  Service: Orthopedics;  Laterality: Left;  . TOTAL SHOULDER ARTHROPLASTY Left 2007    Social History:  reports that he quit smoking about 35 years ago. He has never used smokeless tobacco. He reports that he does not drink alcohol or use drugs.  Allergies: Allergies  Allergen Reactions  . Spironolactone     nausea  . Hydrocodone Nausea And Vomiting    Family History:  Family History  Problem Relation Age of Onset  . Heart attack Mother   . Hypertension Mother   . Suicidality Father   . Heart disease Sister   . Anesthesia problems Neg Hx   . Hypotension Neg Hx   . Malignant hyperthermia Neg Hx   . Pseudochol deficiency Neg Hx   . Colon cancer Neg Hx   . Esophageal cancer Neg Hx   . Rectal cancer Neg Hx   . Stomach cancer  Neg Hx     Prior to Admission medications   Medication Sig Start Date End Date Taking? Authorizing Provider  amLODipine (NORVASC) 2.5 MG tablet Take 2.5 mg by mouth daily. 06/01/18  Yes [provider]  atorvastatin (LIPITOR) 10 MG tablet Take 10 mg by mouth daily.   Yes [provider]  Cholecalciferol (VITAMIN D3) 125 MCG (5000 UT) CAPS Take 5,000 Units by mouth daily.   Yes [provider]  cloNIDine (CATAPRES) 0.2 MG tablet Take 0.2 mg by mouth 2 (two) times daily.  06/09/16  Yes [provider]  diclofenac (VOLTAREN) 75 MG EC tablet Take 75 mg by mouth 2 (two) times daily.   Yes  [provider]  eplerenone (INSPRA) 25 MG tablet Take 25 mg by mouth daily.  05/14/16  Yes [provider]  finasteride (PROSCAR) 5 MG tablet Take 5 mg by mouth every morning.    Yes [provider]  folic acid (FOLVITE) 1 MG tablet Take 1 mg by mouth daily.   Yes [provider]  furosemide (LASIX) 40 MG tablet Take 40 mg by mouth 2 (two) times daily.  06/04/19  Yes [provider]  gabapentin (NEURONTIN) 300 MG capsule Take 300 mg by mouth 3 (three) times daily.    Yes [provider]  methotrexate (RHEUMATREX) 2.5 MG tablet Take 10 mg by mouth once a week. Caution:Chemotherapy. Protect from light.  Friday   Yes [provider]  metoprolol (TOPROL-XL) 100 MG 24 hr tablet Take 150 mg by mouth every morning.    Yes [provider]  Multiple Vitamins-Minerals (CENTRUM SILVER 50+MEN PO) Take 1 tablet by mouth daily.   Yes [provider]  omega-3 acid ethyl esters (LOVAZA) 1 g capsule Take 1 g by mouth 2 (two) times daily.   Yes [provider]  polyethylene glycol (MIRALAX / GLYCOLAX) packet Take 17 g by mouth daily as needed for mild constipation.   Yes [provider]  quinapril (ACCUPRIL) 40 MG tablet Take 40 mg by mouth daily.  05/05/16  Yes [provider]  traMADol (ULTRAM) 50 MG tablet Take 50 mg by mouth every 6 (six) hours as needed for moderate pain or severe pain.  05/30/13  Yes [provider]  levofloxacin (LEVAQUIN) 500 MG tablet Take 1 tablet (500 mg total) by mouth daily. Patient not taking: Reported on 10/15/2019 07/25/19   Campbell Riches, MD  mometasone (ELOCON) 0.1 % ointment 1 application 1-2 times per day until resolved. Patient not taking: Reported on 10/15/2019 06/21/18   Jiles Prows, MD  montelukast (SINGULAIR) 10 MG tablet Take 10 mg by mouth daily. 05/18/18   [provider]  pimecrolimus (ELIDEL) 1 % cream 1 application 1-2 times daily until  resolved. Patient not taking: Reported on 10/15/2019 06/21/18   Jiles Prows, MD    Review of Systems:  Constitutional: Denies fever, chills, diaphoresis, appetite change. HEENT: Denies photophobia, eye pain, redness, hearing loss, ear pain, congestion, sore throat, rhinorrhea, sneezing, mouth sores, trouble swallowing, neck pain, neck stiffness and tinnitus.   Respiratory: Denies SOB, DOE, cough, chest tightness,  and wheezing.   Cardiovascular: Denies chest pain, palpitations and leg swelling.  Gastrointestinal: Denies nausea, vomiting, abdominal pain, diarrhea, constipation, blood in stool and abdominal distention.  Genitourinary: Denies dysuria, urgency, frequency, hematuria, flank pain and difficulty urinating.  Endocrine: Denies: hot or cold intolerance, sweats, changes in hair or nails, polyuria, polydipsia. Musculoskeletal: Denies myalgias, back pain, joint swelling, arthralgias  and gait problem.  Skin: See below for factors of his cellulitis: Neurological: Denies dizziness, seizures, syncope, weakness, light-headedness, numbness and headaches.  Hematological: Denies adenopathy. Easy bruising, personal or family bleeding history  Psychiatric/Behavioral: Denies suicidal ideation, mood changes, confusion, nervousness, sleep disturbance and agitation    Physical Exam: Vitals:   10/15/19 1123 10/15/19 1256 10/15/19 1400  BP: (!) 170/81  (!) 149/75  Pulse: 70  (!) 59  Resp: 18 14 18   Temp: 97.8 F (36.6 C)    TempSrc: Oral    SpO2: 97%  96%     Constitutional: NAD, calm, comfortable, obese Eyes: PERRL, lids and conjunctivae normal ENMT: Mucous membranes are moist.  Neck: normal, supple, no masses, no thyromegaly Respiratory: clear to auscultation bilaterally, no wheezing, no crackles. Normal respiratory effort. No accessory muscle use.  Cardiovascular: Regular rate and rhythm, no murmurs / rubs / gallops. No extremity edema. 2+ pedal pulses. No carotid bruits.  Abdomen: no  tenderness, no masses palpated. No hepatosplenomegaly. Bowel sounds positive.  Musculoskeletal: no clubbing / cyanosis. No joint deformity upper and lower extremities. Good ROM, no contractures. Normal muscle tone.  Skin: See below for pictures of his cellulitis:        Neurologic: CN 2-12 grossly intact. Sensation intact, DTR normal. Strength 5/5 in all 4.  Psychiatric: Normal judgment and insight. Alert and oriented x 3. Normal mood.    Labs on Admission: I have personally reviewed the following labs and imaging studies  CBC: Recent Labs  Lab 10/15/19 1158  WBC 6.5  NEUTROABS 5.2  HGB 11.9*  HCT 35.7*  MCV 105.0*  PLT 0000000   Basic Metabolic Panel: Recent Labs  Lab 10/15/19 1158  NA 134*  K 4.7  CL 100  CO2 26  GLUCOSE 124*  BUN 38*  CREATININE 1.43*  CALCIUM 8.9   GFR: CrCl cannot be calculated (Unknown ideal weight.). Liver Function Tests: Recent Labs  Lab 10/15/19 1158  AST 32  ALT 42  ALKPHOS 57  BILITOT 0.9  PROT 6.6  ALBUMIN 3.3*   No results for input(s): LIPASE, AMYLASE in the last 168 hours. No results for input(s): AMMONIA in the last 168 hours. Coagulation Profile: No results for input(s): INR, PROTIME in the last 168 hours. Cardiac Enzymes: No results for input(s): CKTOTAL, CKMB, CKMBINDEX, TROPONINI in the last 168 hours. BNP (last 3 results) No results for input(s): PROBNP in the last 8760 hours. HbA1C: No results for input(s): HGBA1C in the last 72 hours. CBG: No results for input(s): GLUCAP in the last 168 hours. Lipid Profile: No results for input(s): CHOL, HDL, LDLCALC, TRIG, CHOLHDL, LDLDIRECT in the last 72 hours. Thyroid Function Tests: No results for input(s): TSH, T4TOTAL, FREET4, T3FREE, THYROIDAB in the last 72 hours. Anemia Panel: No results for input(s): VITAMINB12, FOLATE, FERRITIN, TIBC, IRON, RETICCTPCT in the last 72 hours. Urine analysis:    Component Value Date/Time   COLORURINE YELLOW 10/11/2012 [place] 10/11/2012 1105   LABSPEC 1.007 10/11/2012 1105   PHURINE 6.5 10/11/2012 1105   GLUCOSEU NEGATIVE 10/11/2012 1105   HGBUR NEGATIVE 10/11/2012 1105   BILIRUBINUR NEGATIVE 10/11/2012 1105   KETONESUR NEGATIVE 10/11/2012 1105   PROTEINUR NEGATIVE 10/11/2012 1105   UROBILINOGEN 0.2 10/11/2012 1105   NITRITE NEGATIVE 10/11/2012 1105   LEUKOCYTESUR NEGATIVE 10/11/2012 1105   Sepsis Labs: @LABRCNTIP (procalcitonin:4,lacticidven:4) )No results found for this or any previous visit (from the past 240 hour(s)).   Radiological Exams on Admission: VAS [place] LOWER EXTREMITY VENOUS (  DVT) (ONLY [facility])  Result Date: 10/15/2019  Lower Venous DVTStudy Indications: Edema.  Risk Factors: None identified. Limitations: Body habitus and poor ultrasound/tissue interface. Comparison Study: No prior studies. Performing Technologist: Oliver Hum RVT  Examination Guidelines: A complete evaluation includes B-mode imaging, spectral Doppler, color Doppler, and power Doppler as needed of all accessible portions of each vessel. Bilateral testing is considered an integral part of a complete examination. Limited examinations for reoccurring indications may be performed as noted. The reflux portion of the exam is performed with the patient in reverse Trendelenburg.  +---------+---------------+---------+-----------+----------+-------------------+ RIGHT    CompressibilityPhasicitySpontaneityPropertiesThrombus Aging      +---------+---------------+---------+-----------+----------+-------------------+ CFV      Full           Yes      Yes                                      +---------+---------------+---------+-----------+----------+-------------------+ SFJ      Full                                                             +---------+---------------+---------+-----------+----------+-------------------+ FV Prox  Full                                                              +---------+---------------+---------+-----------+----------+-------------------+ FV Mid   Full                                                             +---------+---------------+---------+-----------+----------+-------------------+ FV DistalFull                                                             +---------+---------------+---------+-----------+----------+-------------------+ PFV      Full                                                             +---------+---------------+---------+-----------+----------+-------------------+ POP      Full           Yes      Yes                                      +---------+---------------+---------+-----------+----------+-------------------+ PTV      Full                                                             +---------+---------------+---------+-----------+----------+-------------------+  PERO                                                  Patency shown with                                                        color doppler       +---------+---------------+---------+-----------+----------+-------------------+   +----+---------------+---------+-----------+----------+--------------+ LEFTCompressibilityPhasicitySpontaneityPropertiesThrombus Aging +----+---------------+---------+-----------+----------+--------------+ CFV Full           Yes      Yes                                 +----+---------------+---------+-----------+----------+--------------+     Summary: RIGHT: - There is no evidence of deep vein thrombosis in the lower extremity. However, portions of this examination were limited- see technologist comments above.  - No cystic structure found in the popliteal fossa.  LEFT: - No evidence of common femoral vein obstruction.  *See table(s) above for measurements and observations. Electronically signed by Harold Barban MD on 10/15/2019 at 1:13:24 PM.    Final     EKG: Independently reviewed.  Normal sinus rhythm at a rate of 74 with a right bundle branch block  Assessment/Plan Principal Problem:   Cellulitis of right leg Active Problems:   Hypertension   Hyperlipidemia   ARF (acute renal failure) (HCC)   Bullous pemphigoid    Right lower extremity cellulitis -Blood cultures requested. -Was given vancomycin and Rocephin in the ED. -We will continue Rocephin per cellulitis order set. -Monitor fever curve and leukocytosis.  Acute renal failure -Baseline creatinine is around 0.8-0.9. -Creatinine on admission is 1.43. -Hold Lasix, quinapril, eplerenone. -Give 75 mL an hour of normal saline over the next 24 hours. -Recheck renal function in a.m.  Hypertension -Well-controlled. -Continue home medication with the exception of nephrotoxic drugs. -Follow.  Hyperlipidemia -Continue statin.  Bullous pemphigoid -Continue weekly methotrexate, follow-up outpatient with dermatology.   DVT prophylaxis: Subcutaneous Lovenox Code Status: Full code Family Communication: Patient only Disposition Plan: Admit to MedSurg, continue IV antibiotics, anticipate discharge home over the next 3 to 4 days. Consults called: None Admission status: Admit - It is my clinical opinion that admission to INPATIENT is reasonable and necessary because of the expectation that this patient will require hospital care that crosses at least 2 midnights to treat this condition based on the medical complexity of the problems presented.  Given the aforementioned information, the predictability of an adverse outcome is felt to be significant.    Time Spent: 75 minutes  Boneta Standre Isaac Bliss MD [facility] Pager [phone]  If 7PM-7AM, please contact night-coverage [email or web address] Password [email or web address]  10/15/2019, 3:30 PM

## 2019-10-15 NOTE — ED Notes (Signed)
Pt provided with bedside urinal

## 2019-10-15 NOTE — Progress Notes (Signed)
Pharmacy Brief Note:  Methotrexate (Trexall; Rheumatrex) hold criteria  Hgb < 8  WBC < 3  Pltc < 100K  SCr > 1.5x baseline (or > 2 if baseline unknown)  AST or ALT >3x ULN  Bili > 1.5x ULN  Ascites or pleural effusion  Diarrhea - Grade 2 or higher  Ulcerative stomatitis  Unexplained pneumonitis / hypoxemia  Active infection   Methotrexate discontinued at this time due to patient being treated for active infection (cellulitis).   Lindell Spar, PharmD, BCPS Pager: [phone] 10/15/2019 5:13 PM

## 2019-10-15 NOTE — ED Provider Notes (Signed)
Medical screening examination/treatment/procedure(s) were conducted as a shared visit with non-physician practitioner(s) and myself.  I personally evaluated the patient during the encounter.    Patient does have history of peripheral edema with changes of chronic venous stasis.  Over the past couple days however the right leg has become much more swollen.  He reports that it started to turn red in a limited area of the lower lateral leg, but now has increased rapidly and redness involves all of the lower leg.  Patient reports it is uncomfortable but he can still walk on it.  Patient reports he did have an ulcer on the third toe on the right foot that was treated by Dr. [name] with antibiotics every week for several weeks.  He has just recently finished his course of antibiotics.  Patient is alert and appropriate.  No respiratory distress at rest.  Examination of the right lower extremity shows bright erythema of the entirety of the lower leg with 3+ edema.  Blanching is also present the medial thigh just before the groin.  There is no involvement of the groin fold or scrotum.  No scrotal swelling.  No scrotal cellulitis.  At this time with extensive cellulitis involving all of the lower leg and a sending to the upper leg, will initiate Vanco and Rocephin and have the patient admitted.   Charlesetta Shanks, MD 10/15/19 1422

## 2019-10-15 NOTE — Progress Notes (Signed)
Right lower extremity venous duplex has been completed. Preliminary results can be found in CV Proc through chart review.  Results were given to Alfredia Client PA.  10/15/19 12:23 PM [name] RVT

## 2019-10-15 NOTE — ED Notes (Signed)
Called floor to give report. Nurse in middle of tasking will call back in 5 minutes.

## 2019-10-15 NOTE — ED Provider Notes (Signed)
[facility] DEPT Provider Note   CSN: [id number] Arrival date & time: 10/15/19  1111     History Chief Complaint  Patient presents with  . Leg Swelling  . Leg Pain    [name] is a 86 y.o. male with pertinent past medical history of arthritis, hypertension, edema of the extremities that presents the emergency department today for right leg swelling and redness that started last night.  Patient states that both legs are moderately swollen for the past couple of years, however when he wakes up the swelling normally goes down.  He noticed that on the right leg for the past three days that his leg swelling has not decreased after he woke up.  States that yesterday he noticed that his right leg became extremely red and double the size of his left leg.  States that he noticed that he can not put his compression stockings on his right leg.  Denies any pain to that area.  Denies any fevers, chills.  Patient does not have history of CHF.  Patient states that he had knee surgery on the right about a month ago.  Denies any calf tenderness, clotting disorders, previous DVT, recent long travel.  Denies being on any blood thinners.  Patient denies any shortness of breath that is new or chest pain.  Patient states that he is intermittent shortness of breath for the past several years, has been unchanged.  Has not tried to take anything for this.  Denies any abdominal pain, back pain, nausea, vomiting, dizziness, weakness, fatigue, hemoptysis, URI symptoms.  Denies tobacco use. HPI     Past Medical History:  Diagnosis Date  . Anxiety    "pt. denies" 10-11-12  . Arthritis    degenerative arthritis   . Arthritis, lumbar spine   . BPH (benign prostatic hyperplasia)   . Dysrhythmia    1st degree heart block  palpitations 04/07/2011, seen & complete eval. /w Dr. Radford Pax- stress, echo, holter monitor, has been cleared for surg.   . Edema of both legs    improved  with use of meds/ compression socks used.  Marland Kitchen Heart palpitations 2012  . Hypertension   . Pemphigoid   . PONV (postoperative nausea and vomiting)   . PVC's (premature ventricular contractions)   . Spinal stenosis     Patient Active Problem List   Diagnosis Date Noted  . Cellulitis and abscess of toe of right foot 07/25/2019  . Foot osteomyelitis, right ([place]) 06/22/2019  . Left leg cellulitis 12/31/2015  . Edema of extremities 07/06/2013  . PVC's (premature ventricular contractions)   . Degenerative arthritis of hip 10/14/2012  . Postoperative anemia due to acute blood loss 06/04/2011  . Asthma exacerbation, allergic 06/04/2011  . Hyponatremia 06/04/2011  . Tachycardia 04/07/2011  . Hypertension 04/07/2011  . BPH (benign prostatic hyperplasia) 04/07/2011  . DJD (degenerative joint disease) 04/07/2011    Past Surgical History:  Procedure Laterality Date  . BACK SURGERY  01/2011   02/2011  . JOINT REPLACEMENT     R knee replacement-  10/06, L shoulder - 3/07, R hip- replacement- '07  . TOTAL HIP ARTHROPLASTY  2007   right total hip replacement  . TOTAL HIP ARTHROPLASTY Left 10/14/2012   Procedure: LEFT TOTAL HIP ARTHROPLASTY ANTERIOR APPROACH;  Surgeon: Mcarthur Rossetti, MD;  Location: [facility];  Service: Orthopedics;  Laterality: Left;  . TOTAL KNEE ARTHROPLASTY  2007   right total knee  .  TOTAL KNEE ARTHROPLASTY  06/01/2011   Procedure: TOTAL KNEE ARTHROPLASTY;  Surgeon: Lorn Junes, MD;  Location: [facility];  Service: Orthopedics;  Laterality: Left;  . TOTAL SHOULDER ARTHROPLASTY Left 2007       Family History  Problem Relation Age of Onset  . Heart attack Mother   . Hypertension Mother   . Suicidality Father   . Heart disease Sister   . Anesthesia problems Neg Hx   . Hypotension Neg Hx   . Malignant hyperthermia Neg Hx   . Pseudochol deficiency Neg Hx   . Colon cancer Neg Hx   . Esophageal cancer Neg Hx   . Rectal cancer Neg Hx   . Stomach cancer Neg Hx      Social History   Tobacco Use  . Smoking status: Former Smoker    Quit date: 03/18/1984    Years since quitting: 35.6  . Smokeless tobacco: Never Used  Substance Use Topics  . Alcohol use: No    Alcohol/week: 1.0 standard drinks    Types: 1 Glasses of wine per week    Comment: glass of wine- holidays, or less   . Drug use: No    Home Medications Prior to Admission medications   Medication Sig Start Date End Date Taking? Authorizing Provider  amLODipine (NORVASC) 2.5 MG tablet Take 2.5 mg by mouth daily. 06/01/18  Yes [provider]  atorvastatin (LIPITOR) 10 MG tablet Take 10 mg by mouth daily.   Yes [provider]  Cholecalciferol (VITAMIN D3) 125 MCG (5000 UT) CAPS Take 5,000 Units by mouth daily.   Yes [provider]  cloNIDine (CATAPRES) 0.2 MG tablet Take 0.2 mg by mouth 2 (two) times daily.  06/09/16  Yes [provider]  diclofenac (VOLTAREN) 75 MG EC tablet Take 75 mg by mouth 2 (two) times daily.   Yes [provider]  eplerenone (INSPRA) 25 MG tablet Take 25 mg by mouth daily.  05/14/16  Yes [provider]  finasteride (PROSCAR) 5 MG tablet Take 5 mg by mouth every morning.    Yes [provider]  folic acid (FOLVITE) 1 MG tablet Take 1 mg by mouth daily.   Yes [provider]  furosemide (LASIX) 40 MG tablet Take 40 mg by mouth 2 (two) times daily.  06/04/19  Yes [provider]  gabapentin (NEURONTIN) 300 MG capsule Take 300 mg by mouth 3 (three) times daily.    Yes [provider]  methotrexate (RHEUMATREX) 2.5 MG tablet Take 10 mg by mouth once a week. Caution:Chemotherapy. Protect from light.  Friday   Yes [provider]  metoprolol (TOPROL-XL) 100 MG 24 hr tablet Take 150 mg by mouth every morning.    Yes [provider]  Multiple Vitamins-Minerals (CENTRUM SILVER 50+MEN PO) Take 1 tablet by mouth daily.   Yes [provider]  omega-3 acid ethyl  esters (LOVAZA) 1 g capsule Take 1 g by mouth 2 (two) times daily.   Yes [provider]  polyethylene glycol (MIRALAX / GLYCOLAX) packet Take 17 g by mouth daily as needed for mild constipation.   Yes [provider]  quinapril (ACCUPRIL) 40 MG tablet Take 40 mg by mouth daily.  05/05/16  Yes [provider]  traMADol (ULTRAM) 50 MG tablet Take 50 mg by mouth every 6 (six) hours as needed for moderate pain or severe pain.  05/30/13  Yes [provider]  levofloxacin (LEVAQUIN) 500 MG tablet Take 1 tablet (500  mg total) by mouth daily. Patient not taking: Reported on 10/15/2019 07/25/19   Campbell Riches, MD  mometasone (ELOCON) 0.1 % ointment 1 application 1-2 times per day until resolved. Patient not taking: Reported on 10/15/2019 06/21/18   Jiles Prows, MD  montelukast (SINGULAIR) 10 MG tablet Take 10 mg by mouth daily. 05/18/18   [provider]  pimecrolimus (ELIDEL) 1 % cream 1 application 1-2 times daily until resolved. Patient not taking: Reported on 10/15/2019 06/21/18   Jiles Prows, MD    Allergies    Spironolactone and Hydrocodone  Review of Systems   Review of Systems  Constitutional: Negative for chills, diaphoresis, fatigue and fever.  HENT: Negative for congestion, sore throat and trouble swallowing.   Eyes: Negative for pain and visual disturbance.  Respiratory: Negative for cough, shortness of breath and wheezing.   Cardiovascular: Positive for leg swelling (R). Negative for chest pain and palpitations.  Gastrointestinal: Negative for abdominal distention, abdominal pain, diarrhea, nausea and vomiting.  Genitourinary: Negative for difficulty urinating.  Musculoskeletal: Negative for back pain, neck pain and neck stiffness.  Skin: Negative for pallor.  Neurological: Negative for dizziness, speech difficulty, weakness and headaches.  Psychiatric/Behavioral: Negative for confusion.    Physical Exam Updated Vital Signs BP (!)  149/75   Pulse (!) 59   Temp 97.8 F (36.6 C) (Oral)   Resp 18   SpO2 96%   Physical Exam Constitutional:      General: He is not in acute distress.    Appearance: Normal appearance. He is not ill-appearing, toxic-appearing or diaphoretic.  HENT:     Mouth/Throat:     Mouth: Mucous membranes are moist.     Pharynx: Oropharynx is clear.  Eyes:     General: No scleral icterus.    Extraocular Movements: Extraocular movements intact.     Pupils: Pupils are equal, round, and reactive to light.  Cardiovascular:     Rate and Rhythm: Normal rate and regular rhythm.     Pulses: Normal pulses.     Heart sounds: Normal heart sounds.  Pulmonary:     Effort: Pulmonary effort is normal. No respiratory distress.     Breath sounds: Normal breath sounds. No stridor. No wheezing, rhonchi or rales.  Chest:     Chest wall: No tenderness.  Abdominal:     General: Abdomen is flat. There is no distension.     Palpations: Abdomen is soft.     Tenderness: There is no abdominal tenderness. There is no guarding or rebound.  Musculoskeletal:        General: No swelling or tenderness. Normal range of motion.     Cervical back: Normal range of motion and neck supple. No rigidity.     Right lower leg: Edema (Right leg with shiny skin and erythema extending from foot until knee. Edema with blanching noticed until upper thigh.  Warmth noticed throughout.  3+ pitting edema from foot to knee.) present.     Left lower leg: Edema (Patient with 1+ edema from foot to mid calf, patient states that this is normal for him.  No overlying's signs of infection or warmth.) present.     Comments: Patient is able to move right and left lower extremity in all directions.  Patient has normal strength in both upper and lower extremities.  Normal sensation throughout, normal sensation in right leg.  Skin:    General: Skin is warm and dry.     Capillary Refill: Capillary refill takes  less than 2 seconds.     Coloration: Skin is  not pale.  Neurological:     General: No focal deficit present.     Mental Status: He is alert and oriented to person, place, and time.  Psychiatric:        Mood and Affect: Mood normal.        Behavior: Behavior normal.         ED Results / Procedures / Treatments   Labs (all labs ordered are listed, but only abnormal results are displayed) Labs Reviewed  COMPREHENSIVE METABOLIC PANEL - Abnormal; Notable for the following components:      Result Value   Sodium 134 (*)    Glucose, Bld 124 (*)    BUN 38 (*)    Creatinine, Ser 1.43 (*)    Albumin 3.3 (*)    GFR calc non Af Amer 45 (*)    GFR calc Af Amer 52 (*)    All other components within normal limits  CBC WITH DIFFERENTIAL/PLATELET - Abnormal; Notable for the following components:   RBC 3.40 (*)    Hemoglobin 11.9 (*)    HCT 35.7 (*)    MCV 105.0 (*)    MCH 35.0 (*)    All other components within normal limits  SARS CORONAVIRUS 2 BY RT PCR (HOSPITAL ORDER, [facility])    EKG None  Radiology VAS [place] LOWER EXTREMITY VENOUS (DVT) (ONLY [facility])  Result Date: 10/15/2019  Lower Venous DVTStudy Indications: Edema.  Risk Factors: None identified. Limitations: Body habitus and poor ultrasound/tissue interface. Comparison Study: No prior studies. Performing Technologist: Oliver Hum RVT  Examination Guidelines: A complete evaluation includes B-mode imaging, spectral Doppler, color Doppler, and power Doppler as needed of all accessible portions of each vessel. Bilateral testing is considered an integral part of a complete examination. Limited examinations for reoccurring indications may be performed as noted. The reflux portion of the exam is performed with the patient in reverse Trendelenburg.  +---------+---------------+---------+-----------+----------+-------------------+ RIGHT    CompressibilityPhasicitySpontaneityPropertiesThrombus Aging       +---------+---------------+---------+-----------+----------+-------------------+ CFV      Full           Yes      Yes                                      +---------+---------------+---------+-----------+----------+-------------------+ SFJ      Full                                                             +---------+---------------+---------+-----------+----------+-------------------+ FV Prox  Full                                                             +---------+---------------+---------+-----------+----------+-------------------+ FV Mid   Full                                                             +---------+---------------+---------+-----------+----------+-------------------+  FV DistalFull                                                             +---------+---------------+---------+-----------+----------+-------------------+ PFV      Full                                                             +---------+---------------+---------+-----------+----------+-------------------+ POP      Full           Yes      Yes                                      +---------+---------------+---------+-----------+----------+-------------------+ PTV      Full                                                             +---------+---------------+---------+-----------+----------+-------------------+ PERO                                                  Patency shown with                                                        color doppler       +---------+---------------+---------+-----------+----------+-------------------+   +----+---------------+---------+-----------+----------+--------------+ LEFTCompressibilityPhasicitySpontaneityPropertiesThrombus Aging +----+---------------+---------+-----------+----------+--------------+ CFV Full           Yes      Yes                                  +----+---------------+---------+-----------+----------+--------------+     Summary: RIGHT: - There is no evidence of deep vein thrombosis in the lower extremity. However, portions of this examination were limited- see technologist comments above.  - No cystic structure found in the popliteal fossa.  LEFT: - No evidence of common femoral vein obstruction.  *See table(s) above for measurements and observations. Electronically signed by Harold Barban MD on 10/15/2019 at 1:13:24 PM.    Final     Procedures Procedures (including critical care time)  Medications Ordered in ED Medications  vancomycin (VANCOCIN) IVPB 1000 mg/200 mL premix (has no administration in time range)  cefTRIAXone (ROCEPHIN) 1 g in sodium chloride 0.9 % 100 mL IVPB (1 g Intravenous New Bag/Given 10/15/19 1443)    ED Course  I have reviewed the triage vital signs and the nursing notes.  Pertinent labs & imaging results that were available during my care of the patient were reviewed by me and considered in my  medical decision making (see chart for details).    MDM Rules/Calculators/A&P                     [name] is a 86 y.o. male with pertinent past medical history of arthritis, hypertension, edema of the extremities that presents the emergency department today for right leg swelling and redness that started last night.  Patient's history is significant for cellulitis and/or DVT.  We will get DVT study at this time.  Patient's vitals are currently stable, patient is afebrile without any antipyretics.  Nontachycardic.  Patient has not any pain right now.  Patient's risk factors for DVT are sedentary lifestyle and recent surgery last month.  Labs with stable CBC.  CMP with increased creatinine to 1.43, baseline around 1.  This could be due to dehydration.  BUN is increased.  Will have hospital follow-up with this.   DVT study was negative for clot.  Patient with cellulitis that extends from foot up until upper thigh.  No  scrotal involvement.Patient most likely needs to be admitted and started on IV antibiotics.  Will initiate Rocephin and vancomycin at this time.  Did speak to Dr. [employer] about this.  Will consult for admission.   3:03 PM discussed case with hospitalist, Dr. Jerilee Hoh who agrees to accept care of patient.  The patient appears reasonably stabilized for admission considering the current resources, flow, and capabilities available in the ED at this time, and I doubt any other [facility] requiring further screening and/or treatment in the ED prior to admission. .I discussed this case with my attending physician who cosigned this note including patient's presenting symptoms, physical exam, and planned diagnostics and interventions. Attending physician stated agreement with plan or made changes to plan which were implemented.   Attending physician assessed patient at bedside.  Final Clinical Impression(s) / ED Diagnoses Final diagnoses:  Cellulitis of right lower extremity    Rx / DC Orders ED Discharge Orders    None       Alfredia Client, PA-C 10/15/19 [place], MD 10/25/19 1317

## 2019-10-15 NOTE — ED Notes (Signed)
Vascular at bedside unable to obtain labs or EKG at this time

## 2019-10-15 NOTE — ED Notes (Signed)
Pts visitor took pts walker home

## 2019-10-16 LAB — CBC
HCT: 35.2 % — ABNORMAL LOW (ref 39.0–52.0)
Hemoglobin: 11.8 g/dL — ABNORMAL LOW (ref 13.0–17.0)
MCH: 35.4 pg — ABNORMAL HIGH (ref 26.0–34.0)
MCHC: 33.5 g/dL (ref 30.0–36.0)
MCV: 105.7 fL — ABNORMAL HIGH (ref 80.0–100.0)
Platelets: 162 10*3/uL (ref 150–400)
RBC: 3.33 MIL/uL — ABNORMAL LOW (ref 4.22–5.81)
RDW: 13.6 % (ref 11.5–15.5)
WBC: 5.3 10*3/uL (ref 4.0–10.5)
nRBC: 0 % (ref 0.0–0.2)

## 2019-10-16 LAB — BASIC METABOLIC PANEL
Anion gap: 8 (ref 5–15)
BUN: 23 mg/dL (ref 8–23)
CO2: 25 mmol/L (ref 22–32)
Calcium: 9.1 mg/dL (ref 8.9–10.3)
Chloride: 104 mmol/L (ref 98–111)
Creatinine, Ser: 0.97 mg/dL (ref 0.61–1.24)
GFR calc Af Amer: >60 mL/min (ref >60)
GFR calc non Af Amer: >60 mL/min (ref >60)
Glucose, Bld: 98 mg/dL (ref 70–99)
Potassium: 4.8 mmol/L (ref 3.5–5.1)
Sodium: 137 mmol/L (ref 135–145)

## 2019-10-16 MED ORDER — MONTELUKAST SODIUM 10 MG PO TABS
10.0000 mg | ORAL_TABLET | Freq: Every day | ORAL | Status: DC
  Administered 2019-10-16 – 2019-10-18 (×3): 10 mg via ORAL
  Filled 2019-10-16 (×3): qty 1

## 2019-10-16 MED ORDER — FUROSEMIDE 40 MG PO TABS: 40.0000 mg | ORAL_TABLET | Freq: Two times a day (BID) | ORAL | Status: DC

## 2019-10-16 MED ORDER — FUROSEMIDE 40 MG PO TABS
40.0000 mg | ORAL_TABLET | Freq: Two times a day (BID) | ORAL | Status: DC
  Administered 2019-10-17: 40 mg via ORAL

## 2019-10-16 MED ORDER — FUROSEMIDE 10 MG/ML IJ SOLN
40.0000 mg | Freq: Once | INTRAMUSCULAR | Status: AC
  Administered 2019-10-16: 40 mg via INTRAVENOUS
  Filled 2019-10-16: qty 4

## 2019-10-16 NOTE — Plan of Care (Signed)
Plan of care reviewed and discussed with the patient. 

## 2019-10-16 NOTE — Evaluation (Signed)
Physical Therapy Evaluation Patient Details Name: [name] MRN: [id number] DOB: 28-Feb-1933 Today's Date: 10/16/2019   History of Present Illness  Patient is 86 y.o. male who has a past medical history that is significant for hypertension, hyperlipidemia, bullous pemphigoid, obesity and chronic venous insufficiency. Over the months of March and April he developed a blister over his third toe on the right, was seen by Dr. [name] with ID, was given 4 weekly doses of oritavancin and also completed therapy with Cipro and doxy. He states that over the past 2 days he has noticed that the edema has worsened in the right leg and yesterday noticed redness of his leg, because of continued progression of edema, erythema and pain he decided to come to the hospital.    Clinical Impression  [name] is 86 y.o. male admitted with above HPI and diagnosis. Patient is currently limited by functional impairments below (see PT problem list). Patient lives alone and is modified independent at baseline using Rollator for mobility. Patient will benefit from continued skilled PT interventions to address impairments and progress independence with mobility. Patient will benefit from wound care and compression wrapping for bil LE edema to manage edema secondary to PVD and cellulitis of Rt LE. Pt also will benefit from wound care/dressing for Rt LE venous ulcer. Recommending OP Wound care follow up. Acute PT will follow and progress as able.     Follow Up Recommendations Outpatient PT;Other (comment)(OPPT/OP Wound care center for wrapping and wound dressing)    Equipment Recommendations  None recommended by PT    Recommendations for Other Services       Precautions / Restrictions Precautions Precautions: Fall Restrictions Weight Bearing Restrictions: No      Mobility  Bed Mobility Overal bed mobility: Needs Assistance Bed Mobility: Supine to Sit     Supine to sit: HOB elevated;Min guard      General bed mobility comments: cues to use bed rail, extra time required and extra effort by patient.   Transfers Overall transfer level: Needs assistance Equipment used: Rolling walker (2 wheeled) Transfers: Sit to/from Stand Sit to Stand: Min assist;From elevated surface         General transfer comment: cues for hand placement/technique for power up. assist to steady with rise.  Ambulation/Gait Ambulation/Gait assistance: Min assist Gait Distance (Feet): 180 Feet Assistive device: Rolling walker (2 wheeled) Gait Pattern/deviations: Step-through pattern;Decreased step length - right;Decreased step length - left;Decreased stride length;Trunk flexed Gait velocity: decreased   General Gait Details: cues for safe proximityt to RW, to keep walker on floor. no overt LOB noted. chair follow provided as pt has history of Rt knee buckling.   Stairs     Wheelchair Mobility    Modified Rankin (Stroke Patients Only)       Balance Overall balance assessment: Needs assistance;History of Falls Sitting-balance support: Feet supported Sitting balance-Leahy Scale: Good     Standing balance support: During functional activity;Bilateral upper extremity supported Standing balance-Leahy Scale: Poor                Pertinent Vitals/Pain Pain Assessment: No/denies pain    Home Living Family/patient expects to be discharged to:: Private residence Living Arrangements: Alone   Type of Home: House Home Access: Stairs to enter Entrance Stairs-Rails: None Entrance Stairs-Number of Steps: 2 Home Layout: One level Home Equipment: Environmental consultant - 4 wheels;Cane - single point;Adaptive equipment;Grab bars - tub/shower;Hand held shower head      Prior Function Level of Independence: Independent  with assistive device(s)         Comments: pt uses rollator for mobility and is independent with adaptive equipment to complete ADL's.     Hand Dominance   Dominant Hand: Right     Extremity/Trunk Assessment   Upper Extremity Assessment Upper Extremity Assessment: Overall WFL for tasks assessed    Lower Extremity Assessment Lower Extremity Assessment: Generalized weakness    Cervical / Trunk Assessment Cervical / Trunk Assessment: Kyphotic;Other exceptions Cervical / Trunk Exceptions: large habitus  Communication   Communication: No difficulties  Cognition Arousal/Alertness: Awake/alert Behavior During Therapy: WFL for tasks assessed/performed Overall Cognitive Status: Within Functional Limits for tasks assessed             General Comments General comments (skin integrity, edema, etc.): pt with edema, errythema, and open ulcer on Rt LE. discussed wrapping recommendations with RN and messaged MD suggesting Profore wrap given history of PVD.     Exercises     Assessment/Plan    PT Assessment Patient needs continued PT services  PT Problem List Decreased strength;Decreased range of motion;Decreased activity tolerance;Decreased balance;Decreased mobility;Decreased knowledge of use of DME;Obesity;Decreased skin integrity       PT Treatment Interventions DME instruction;Gait training;Stair training;Functional mobility training;Therapeutic activities;Therapeutic exercise;Balance training;Patient/family education    PT Goals (Current goals can be found in the Care Plan section)  Acute Rehab PT Goals Patient Stated Goal: to decrease swelling in legs and be able to fit into compression stockings again. PT Goal Formulation: With patient Time For Goal Achievement: 10/23/19 Potential to Achieve Goals: Good    Frequency Min 3X/week    AM-PAC PT "6 Clicks" Mobility  Outcome Measure Help needed turning from your back to your side while in a flat bed without using bedrails?: A Little Help needed moving from lying on your back to sitting on the side of a flat bed without using bedrails?: A Little Help needed moving to and from a bed to a chair (including a  wheelchair)?: A Little Help needed standing up from a chair using your arms (e.g., wheelchair or bedside chair)?: A Little Help needed to walk in hospital room?: A Little Help needed climbing 3-5 steps with a railing? : A Lot 6 Click Score: 17    End of Session Equipment Utilized During Treatment: Gait belt Activity Tolerance: Patient tolerated treatment well Patient left: in chair;with call bell/phone within reach;with chair alarm set Nurse Communication: Mobility status PT Visit Diagnosis: Muscle weakness (generalized) (M62.81);Difficulty in walking, not elsewhere classified (R26.2);Unsteadiness on feet (R26.81)    Time: [id number] PT Time Calculation (min) (ACUTE ONLY): 44 min   Charges:   PT Evaluation $PT Eval Moderate Complexity: 1 Mod PT Treatments $Gait Training: 8-22 mins $Self Care/Home Management: 8-22        Verner Mould, DPT Physical Therapist with [facility] [phone]  10/16/2019 4:42 PM

## 2019-10-16 NOTE — Progress Notes (Signed)
PROGRESS NOTE    [name]  [id number] DOB: Sep 08, 1932 DOA: 10/15/2019 PCP: Lajean Manes, MD   Chef Complaints:     Brief Narrative:  86 y.o. male who has a past medical history that is significant for hypertension, hyperlipidemia, bullous pemphigoid, obesity and chronic venous insufficiency. He is on weekly methotrexate for his pemphigoid. Over the months of March and April he developed a blister over his third toe on the right, was seen by Dr. [name] with ID, was given 4 weekly doses of oritavancin and also completed therapy with Cipro and doxy. He states that over the past 2 days he has noticed that the edema that he typically has over his extremities at nighttime that goes away by morning has not been resolving on the right leg. Yesterday noticed redness of his leg, because of continued progression of edema, erythema and pain he decided to come to the hospital today. He has been afebrile. No leukocytosis. He does have some acute renal failure with a creatinine of 1.43. Doppler is negative for DVT. Admission has been requested for further evaluation and management.  Subjective:  Baseline weight 3-4 yrs ago 200 lb, has been 285 past few months. Has had increase in leg swelling on thursday Still red leg but no burning or pain today   Assessment & Plan:  Nonpurulent cellulitis of right leg, improving. Cont on rocephin. Wbc at 5.3 and afebrile.patient with significant leg edema likely contributing to cellulitis on p.o. Lasix at home.  Since AKI resolved will give Lasix 40 mg x 1 and resume p.o. from the morning.  Will benefit with compression stocking/dressing.  Duplex was negative for DVT.  Hypertension- poorly controlled. Resume lasix, cont home clonidine, metoprolol. Resume acei , eplerenone in am if creat stable  Hyperlipidemia: Lipitor.  [id number], resume lasix iv for swelling today. Po in am if bmp stable.  Bullous pemphigoid- on methotrex q Friday, hold now due to  infection.  Morbid Obesity with BMI 39, will benefit with weight loss and healthy lifestyle.  DVT prophylaxis:lovenox Code Status: full Family Communication: plan of care discussed with patient at bedside.  Status is: Inpatient Remains inpatient appropriate because:IV treatments appropriate due to intensity of illness or inability to take PO, Inpatient level of care appropriate due to severity of illness and For ongoing treatment of cellulitis with IV antibiotics given significant erythema swelling.  Dispo: The patient is from: Home              Anticipated d/c is to: Home              Anticipated d/c date is: 2 days obtain PT eval for disposition.              Patient currently is not medically stable to d/c.    Diet Order            Diet Heart Room service appropriate? Yes; Fluid consistency: Thin  Diet effective now              Body mass index is 39.75 kg/m.  Consultants:see note  Procedures:see note Microbiology:see note  Medications: Scheduled Meds: . amLODipine  2.5 mg Oral Daily  . atorvastatin  10 mg Oral q1800  . cholecalciferol  5,000 Units Oral Daily  . cloNIDine  0.2 mg Oral BID  . enoxaparin (LOVENOX) injection  60 mg Subcutaneous Q24H  . finasteride  5 mg Oral Daily  . folic acid  1 mg Oral Daily  . gabapentin  300  mg Oral TID  . metoprolol succinate  150 mg Oral Daily  . multivitamin with minerals  1 tablet Oral Daily  . omega-3 acid ethyl esters  1 g Oral BID   Continuous Infusions: . sodium chloride 75 mL/hr at 10/16/19 0620  . cefTRIAXone (ROCEPHIN)  IV      Antimicrobials: Anti-infectives (From admission, onward)   Start     Dose/Rate Route Frequency Ordered Stop   10/16/19 1400  cefTRIAXone (ROCEPHIN) 2 g in sodium chloride 0.9 % 100 mL IVPB     2 g 200 mL/hr over 30 Minutes Intravenous Every 24 hours 10/15/19 1649     10/15/19 1800  cefTRIAXone (ROCEPHIN) 1 g in sodium chloride 0.9 % 100 mL IVPB     1 g 200 mL/hr over 30 Minutes  Intravenous  Once 10/15/19 1708 10/15/19 1928   10/15/19 1430  vancomycin (VANCOCIN) IVPB 1000 mg/200 mL premix  Status:  Discontinued     1,000 mg 200 mL/hr over 60 Minutes Intravenous  Once 10/15/19 1421 10/15/19 1832   10/15/19 1430  cefTRIAXone (ROCEPHIN) 1 g in sodium chloride 0.9 % 100 mL IVPB     1 g 200 mL/hr over 30 Minutes Intravenous  Once 10/15/19 1421 10/15/19 1512       Objective: Vitals: Today's Vitals   10/15/19 1809 10/15/19 2040 10/15/19 2044 10/16/19 0630  BP: (!) 160/85  (!) 155/68 (!) 171/99  Pulse: 70  65 70  Resp: 20  18 18   Temp: 98 F (36.7 C)  97.6 F (36.4 C) 97.6 F (36.4 C)  TempSrc: Oral  Oral Oral  SpO2: 97%  94% (!) 82%  Weight:      Height:      PainSc:  0-No pain      Intake/Output Summary (Last 24 hours) at 10/16/2019 0918 Last data filed at 10/16/2019 0910 Gross per 24 hour  Intake 2614.6 ml  Output 2675 ml  Net -60.4 ml   Filed Weights   10/15/19 1702  Weight: 129.3 kg   Weight change:    Intake/Output from previous day: 05/30 0701 - 05/31 0700 In: 2494.6 [P.O.:1440; I.V.:847.5; IV Piggyback:207.1] Out: 2575 [Urine:2575] Intake/Output this shift: Total I/O In: 120 [P.O.:120] Out: 100 [Urine:100]  Examination:  General exam: AAOx3,NAD, on RA, weak appearing. HEENT:Oral mucosa moist, Ear/Nose WNL grossly,dentition normal. Respiratory system: bilaterally clear,no wheezing or crackles,no use of accessory muscle, non tender. Cardiovascular system: S1 & S2 +, regular, No JVD. Gastrointestinal system: Abdomen soft, obese, NT,ND, BS+. Nervous System:Alert, awake, moving extremities and grossly nonfocal Extremities: b/l lE edema rt> lt, 2+,distal peripheral pulses difficult to palpate. Se pic below Skin: No rashes,no icterus. MSK: Normal muscle bulk,tone, power From 5/30    From 5/31    Data Reviewed: I have personally reviewed following labs and imaging studies CBC: Recent Labs  Lab 10/15/19 1158 10/16/19 0430  WBC  6.5 5.3  NEUTROABS 5.2  --   HGB 11.9* 11.8*  HCT 35.7* 35.2*  MCV 105.0* 105.7*  PLT 162 0000000   Basic Metabolic Panel: Recent Labs  Lab 10/15/19 1158 10/16/19 0430  NA 134* 137  K 4.7 4.8  CL 100 104  CO2 26 25  GLUCOSE 124* 98  BUN 38* 23  CREATININE 1.43* 0.97  CALCIUM 8.9 9.1   GFR: Estimated Creatinine Clearance: 77.7 mL/min (by C-G formula based on SCr of 0.97 mg/dL). Liver Function Tests: Recent Labs  Lab 10/15/19 1158  AST 32  ALT 42  ALKPHOS 57  BILITOT  0.9  PROT 6.6  ALBUMIN 3.3*   No results for input(s): LIPASE, AMYLASE in the last 168 hours. No results for input(s): AMMONIA in the last 168 hours. Coagulation Profile: No results for input(s): INR, PROTIME in the last 168 hours. Cardiac Enzymes: No results for input(s): CKTOTAL, CKMB, CKMBINDEX, TROPONINI in the last 168 hours. BNP (last 3 results) No results for input(s): PROBNP in the last 8760 hours. HbA1C: No results for input(s): HGBA1C in the last 72 hours. CBG: No results for input(s): GLUCAP in the last 168 hours. Lipid Profile: No results for input(s): CHOL, HDL, LDLCALC, TRIG, CHOLHDL, LDLDIRECT in the last 72 hours. Thyroid Function Tests: No results for input(s): TSH, T4TOTAL, FREET4, T3FREE, THYROIDAB in the last 72 hours. Anemia Panel: No results for input(s): VITAMINB12, FOLATE, FERRITIN, TIBC, IRON, RETICCTPCT in the last 72 hours. Sepsis Labs: No results for input(s): PROCALCITON, LATICACIDVEN in the last 168 hours.  Recent Results (from the past 240 hour(s))  SARS Coronavirus 2 by RT PCR (hospital order, performed in [facility] hospital lab) Nasopharyngeal Nasopharyngeal Swab     Status: None   Collection Time: 10/15/19  2:31 PM   Specimen: Nasopharyngeal Swab  Result Value Ref Range Status   SARS Coronavirus 2 NEGATIVE NEGATIVE Final    Comment: (NOTE) SARS-CoV-2 target nucleic acids are NOT DETECTED. The SARS-CoV-2 RNA is generally detectable in upper and lower respiratory  specimens during the acute phase of infection. The lowest concentration of SARS-CoV-2 viral copies this assay can detect is 250 copies / mL. A negative result does not preclude SARS-CoV-2 infection and should not be used as the sole basis for treatment or other patient management decisions.  A negative result may occur with improper specimen collection / handling, submission of specimen other than nasopharyngeal swab, presence of viral mutation(s) within the areas targeted by this assay, and inadequate number of viral copies (<250 copies / mL). A negative result must be combined with clinical observations, patient history, and epidemiological information. Fact Sheet for Patients:   StrictlyIdeas.no Fact Sheet for Healthcare Providers: BankingDealers.co.za This test is not yet approved or cleared  by the [place] FDA and has been authorized for detection and/or diagnosis of SARS-CoV-2 by FDA under an Emergency Use Authorization (EUA).  This EUA will remain in effect (meaning this test can be used) for the duration of the COVID-19 declaration under Section 564(b)(1) of the Act, 21 U.S.C. section 360bbb-3(b)(1), unless the authorization is terminated or revoked sooner. Performed at [facility] [place] [address] [place], [place] [address]   Culture, blood (routine x 2)     Status: None (Preliminary result)   Collection Time: 10/15/19  5:16 PM   Specimen: BLOOD  Result Value Ref Range Status   Specimen Description BLOOD RIGHT ANTECUBITAL  Final   Special Requests   Final    BOTTLES DRAWN AEROBIC AND ANAEROBIC Blood Culture adequate volume Performed at [place] [address] [place], [place] [address]    Culture PENDING  Incomplete   Report Status PENDING  Incomplete  Culture, blood (routine x 2)     Status: None (Preliminary result)   Collection Time: 10/15/19  5:16 PM   Specimen: BLOOD    Result Value Ref Range Status   Specimen Description BLOOD RIGHT ANTECUBITAL  Final   Special Requests   Final    BOTTLES DRAWN AEROBIC ONLY Blood Culture adequate volume Performed at [facility], [place] [address], [place], [place] [address]  Culture PENDING  Incomplete   Report Status PENDING  Incomplete      Radiology Studies: VAS [place] LOWER EXTREMITY VENOUS (DVT) (ONLY [facility] & [facility])  Result Date: 10/15/2019  Lower Venous DVTStudy Indications: Edema.  Risk Factors: None identified. Limitations: Body habitus and poor ultrasound/tissue interface. Comparison Study: No prior studies. Performing Technologist: Oliver Hum RVT  Examination Guidelines: A complete evaluation includes B-mode imaging, spectral Doppler, color Doppler, and power Doppler as needed of all accessible portions of each vessel. Bilateral testing is considered an integral part of a complete examination. Limited examinations for reoccurring indications may be performed as noted. The reflux portion of the exam is performed with the patient in reverse Trendelenburg.  +---------+---------------+---------+-----------+----------+-------------------+ RIGHT    CompressibilityPhasicitySpontaneityPropertiesThrombus Aging      +---------+---------------+---------+-----------+----------+-------------------+ CFV      Full           Yes      Yes                                      +---------+---------------+---------+-----------+----------+-------------------+ SFJ      Full                                                             +---------+---------------+---------+-----------+----------+-------------------+ FV Prox  Full                                                             +---------+---------------+---------+-----------+----------+-------------------+ FV Mid   Full                                                              +---------+---------------+---------+-----------+----------+-------------------+ FV DistalFull                                                             +---------+---------------+---------+-----------+----------+-------------------+ PFV      Full                                                             +---------+---------------+---------+-----------+----------+-------------------+ POP      Full           Yes      Yes                                      +---------+---------------+---------+-----------+----------+-------------------+ PTV      Full                                                             +---------+---------------+---------+-----------+----------+-------------------+  PERO                                                  Patency shown with                                                        color doppler       +---------+---------------+---------+-----------+----------+-------------------+   +----+---------------+---------+-----------+----------+--------------+ LEFTCompressibilityPhasicitySpontaneityPropertiesThrombus Aging +----+---------------+---------+-----------+----------+--------------+ CFV Full           Yes      Yes                                 +----+---------------+---------+-----------+----------+--------------+     Summary: RIGHT: - There is no evidence of deep vein thrombosis in the lower extremity. However, portions of this examination were limited- see technologist comments above.  - No cystic structure found in the popliteal fossa.  LEFT: - No evidence of common femoral vein obstruction.  *See table(s) above for measurements and observations. Electronically signed by Harold Barban MD on 10/15/2019 at 1:13:24 PM.    Final      LOS: 1 day   Antonieta Pert, MD [facility]  10/16/2019, 9:18 AM

## 2019-10-17 LAB — BASIC METABOLIC PANEL
Anion gap: 9 (ref 5–15)
BUN: 19 mg/dL (ref 8–23)
CO2: 26 mmol/L (ref 22–32)
Calcium: 9 mg/dL (ref 8.9–10.3)
Chloride: 101 mmol/L (ref 98–111)
Creatinine, Ser: 0.94 mg/dL (ref 0.61–1.24)
GFR calc Af Amer: >60 mL/min (ref >60)
GFR calc non Af Amer: >60 mL/min (ref >60)
Glucose, Bld: 100 mg/dL — ABNORMAL HIGH (ref 70–99)
Potassium: 4.5 mmol/L (ref 3.5–5.1)
Sodium: 136 mmol/L (ref 135–145)

## 2019-10-17 LAB — CBC
HCT: 34.1 % — ABNORMAL LOW (ref 39.0–52.0)
Hemoglobin: 11.3 g/dL — ABNORMAL LOW (ref 13.0–17.0)
MCH: 34.8 pg — ABNORMAL HIGH (ref 26.0–34.0)
MCHC: 33.1 g/dL (ref 30.0–36.0)
MCV: 104.9 fL — ABNORMAL HIGH (ref 80.0–100.0)
Platelets: 162 10*3/uL (ref 150–400)
RBC: 3.25 MIL/uL — ABNORMAL LOW (ref 4.22–5.81)
RDW: 13.2 % (ref 11.5–15.5)
WBC: 5.6 10*3/uL (ref 4.0–10.5)
nRBC: 0 % (ref 0.0–0.2)

## 2019-10-17 MED ORDER — FUROSEMIDE 10 MG/ML IJ SOLN
40.0000 mg | Freq: Once | INTRAMUSCULAR | Status: AC
  Administered 2019-10-17: 40 mg via INTRAVENOUS
  Filled 2019-10-17: qty 4

## 2019-10-17 MED ORDER — FUROSEMIDE 40 MG PO TABS
40.0000 mg | ORAL_TABLET | Freq: Two times a day (BID) | ORAL | Status: DC
  Administered 2019-10-18: 40 mg via ORAL
  Filled 2019-10-17 (×2): qty 1

## 2019-10-17 MED ORDER — HYDROCERIN EX CREA
TOPICAL_CREAM | Freq: Two times a day (BID) | CUTANEOUS | Status: DC
  Filled 2019-10-17: qty 113

## 2019-10-17 NOTE — Consult Note (Signed)
[facility] Nurse Consult Note: Reason for Consult  Resolving cellulitis with known history of venous insufficiency.  Has removable compression at home.  Wound type:infectious to right lower leg.  Pressure Injury POA: NA Measurement:erythema and edema to right lower leg Wound [id number] Drainage (amount, consistency, odor) weeping only, scant Periwound:edema and erythema.  Both legs swollen chronic Dressing procedure/placement/frequency: Cleanse both legs with with soap and water and pat dry. Moisturize with eucerin cream.  Apply removable compression garment.  Will not follow at this time.  Please re-consult if needed.  Domenic Moras MSN, RN, FNP-BC [facility]ON Wound, Ostomy, Continence Nurse Pager [phone]

## 2019-10-17 NOTE — Progress Notes (Signed)
PROGRESS NOTE    [name]  [id number] DOB: [birth year] DOA: 10/15/2019 PCP: Lajean Manes, MD   Chef Complaints: Leg swelling and pain  Brief Narrative:  86 y.o. male who has a past medical history that is significant for hypertension, hyperlipidemia, bullous pemphigoid, obesity and chronic venous insufficiency. He is on weekly methotrexate for his pemphigoid. Over the months of March and April he developed a blister over his third toe on the right, was seen by Dr. [name] with ID, was given 4 weekly doses of oritavancin and also completed therapy with Cipro and doxy. He states that over the past 2 days he has noticed that the edema that he typically has over his extremities at nighttime that goes away by morning has not been resolving on the right leg. Yesterday noticed redness of his leg, because of continued progression of edema, erythema and pain he decided to come to the hospital today. He has been afebrile. No leukocytosis. He does have some acute renal failure with a creatinine of 1.43. Doppler is negative for DVT. Admission has been requested for further evaluation and management.  Subjective: On the compression dressing still has significant redness on the right lower extremity but patient reports it is much improved.  He has been anxious and wanted to go home today-agreed to stay for IV antibiotics" I am going home tomorrow".   Assessment & Plan:  Purulent cellulitis of the right lower extremity: Improving nicely, wound care consult continue compression dressing- will dose with IV Lasix 40 mg x 1 again today, resume home Lasix 40 mg twice daily in a.m. significant edema likely contributed to his cellulitis.Duplex was negative for DVT.  Patient has been on ceftriaxone per cellulitis pathway since admission  Hypertension, uncontrolled : Continue Lasix, clonidine and metoprolol.  Holding home acei , eplerenone in am if creat stable  Hyperlipidemia continue Lipitor  AKI :  Resolved tolerating Lasix.  Monitor BMP.    Bullous pemphigoid- on methotrex q Friday, hold now due to infection.  Morbid Obesity with BMI 39, will benefit with weight loss and healthy lifestyle.  DVT prophylaxis:lovenox Code Status: full Family Communication: plan of care discussed with patient at bedside.  Status is: Inpatient Remains inpatient appropriate because:IV treatments appropriate due to intensity of illness or inability to take PO, Inpatient level of care appropriate due to severity of illness and For ongoing treatment of cellulitis with IV antibiotics given significant erythema swelling.  Dispo: The patient is from: Home              Anticipated d/c is to: Home with home health PT/RN-already ordered.              Anticipated d/c date is: 1 day-patient reports he is going to home tomorrow regardless              Patient currently is not medically stable to d/c.    Diet Order            Diet Heart Room service appropriate? Yes; Fluid consistency: Thin  Diet effective now              Body mass index is 39.75 kg/m.  Consultants:see note  Procedures:see note Microbiology:see note  Medications: Scheduled Meds: . amLODipine  2.5 mg Oral Daily  . atorvastatin  10 mg Oral q1800  . cholecalciferol  5,000 Units Oral Daily  . cloNIDine  0.2 mg Oral BID  . enoxaparin (LOVENOX) injection  60 mg Subcutaneous Q24H  . finasteride  5 mg Oral Daily  . folic acid  1 mg Oral Daily  . furosemide  40 mg Oral BID  . gabapentin  300 mg Oral TID  . hydrocerin   Topical BID  . metoprolol succinate  150 mg Oral Daily  . montelukast  10 mg Oral Daily  . multivitamin with minerals  1 tablet Oral Daily  . omega-3 acid ethyl esters  1 g Oral BID   Continuous Infusions: . cefTRIAXone (ROCEPHIN)  IV 2 g (10/17/19 1339)    Antimicrobials: Anti-infectives (From admission, onward)   Start     Dose/Rate Route Frequency Ordered Stop   10/16/19 1400  cefTRIAXone (ROCEPHIN) 2 g in  sodium chloride 0.9 % 100 mL IVPB     2 g 200 mL/hr over 30 Minutes Intravenous Every 24 hours 10/15/19 1649     10/15/19 1800  cefTRIAXone (ROCEPHIN) 1 g in sodium chloride 0.9 % 100 mL IVPB     1 g 200 mL/hr over 30 Minutes Intravenous  Once 10/15/19 1708 10/15/19 1928   10/15/19 1430  vancomycin (VANCOCIN) IVPB 1000 mg/200 mL premix  Status:  Discontinued     1,000 mg 200 mL/hr over 60 Minutes Intravenous  Once 10/15/19 1421 10/15/19 1832   10/15/19 1430  cefTRIAXone (ROCEPHIN) 1 g in sodium chloride 0.9 % 100 mL IVPB     1 g 200 mL/hr over 30 Minutes Intravenous  Once 10/15/19 1421 10/15/19 1512       Objective: Vitals: Today's Vitals   10/16/19 2311 10/17/19 0649 10/17/19 0900 10/17/19 1348  BP: (!) 161/78 (!) 158/86  (!) 156/95  Pulse: 68 60  64  Resp:  20  20  Temp:  (!) 97.4 F (36.3 C)  (!) 97.5 F (36.4 C)  TempSrc:  Oral    SpO2:  97%  95%  Weight:      Height:      PainSc:   0-No pain     Intake/Output Summary (Last 24 hours) at 10/17/2019 1414 Last data filed at 10/17/2019 0836 Gross per 24 hour  Intake 460 ml  Output 1150 ml  Net -690 ml   Filed Weights   10/15/19 1702  Weight: 129.3 kg   Weight change:    Intake/Output from previous day: 05/31 0701 - 06/01 0700 In: 940 [P.O.:840; IV Piggyback:100] Out: 1725 [Urine:1725] Intake/Output this shift: Total I/O In: 120 [P.O.:120] Out: -   Examination:  General exam: AAOx3 , NAD, weak appearing. HEENT:Oral mucosa moist, Ear/Nose WNL grossly, dentition normal. Respiratory system: bilaterally clear,no wheezing or crackles,no use of accessory muscle Cardiovascular system: S1 & S2 +, No JVD,. Gastrointestinal system: Abdomen soft, obese NT,ND, BS+ Nervous System:Alert, awake, moving extremities and grossly nonfocal Extremities: Bilateral lower leg edema, right leg redness swollen than the left but much improved , compression dressing off for exam. Skin: No rashes,no icterus. MSK: Normal muscle  bulk,tone, power From 5/30    From 5/31    Data Reviewed: I have personally reviewed following labs and imaging studies CBC: Recent Labs  Lab 10/15/19 1158 10/16/19 0430 10/17/19 0352  WBC 6.5 5.3 5.6  NEUTROABS 5.2  --   --   HGB 11.9* 11.8* 11.3*  HCT 35.7* 35.2* 34.1*  MCV 105.0* 105.7* 104.9*  PLT 162 162 0000000   Basic Metabolic Panel: Recent Labs  Lab 10/15/19 1158 10/16/19 0430 10/17/19 0352  NA 134* 137 136  K 4.7 4.8 4.5  CL 100 104 101  CO2 26 25 26   GLUCOSE  124* 98 100*  BUN 38* 23 19  CREATININE 1.43* 0.97 0.94  CALCIUM 8.9 9.1 9.0   GFR: Estimated Creatinine Clearance: 80.2 mL/min (by C-G formula based on SCr of 0.94 mg/dL). Liver Function Tests: Recent Labs  Lab 10/15/19 1158  AST 32  ALT 42  ALKPHOS 57  BILITOT 0.9  PROT 6.6  ALBUMIN 3.3*   No results for input(s): LIPASE, AMYLASE in the last 168 hours. No results for input(s): AMMONIA in the last 168 hours. Coagulation Profile: No results for input(s): INR, PROTIME in the last 168 hours. Cardiac Enzymes: No results for input(s): CKTOTAL, CKMB, CKMBINDEX, TROPONINI in the last 168 hours. BNP (last 3 results) No results for input(s): PROBNP in the last 8760 hours. HbA1C: No results for input(s): HGBA1C in the last 72 hours. CBG: No results for input(s): GLUCAP in the last 168 hours. Lipid Profile: No results for input(s): CHOL, HDL, LDLCALC, TRIG, CHOLHDL, LDLDIRECT in the last 72 hours. Thyroid Function Tests: No results for input(s): TSH, T4TOTAL, FREET4, T3FREE, THYROIDAB in the last 72 hours. Anemia Panel: No results for input(s): VITAMINB12, FOLATE, FERRITIN, TIBC, IRON, RETICCTPCT in the last 72 hours. Sepsis Labs: No results for input(s): PROCALCITON, LATICACIDVEN in the last 168 hours.  Recent Results (from the past 240 hour(s))  SARS Coronavirus 2 by RT PCR (hospital order, performed in [facility] hospital lab) Nasopharyngeal Nasopharyngeal Swab     Status: None    Collection Time: 10/15/19  2:31 PM   Specimen: Nasopharyngeal Swab  Result Value Ref Range Status   SARS Coronavirus 2 NEGATIVE NEGATIVE Final    Comment: (NOTE) SARS-CoV-2 target nucleic acids are NOT DETECTED. The SARS-CoV-2 RNA is generally detectable in upper and lower respiratory specimens during the acute phase of infection. The lowest concentration of SARS-CoV-2 viral copies this assay can detect is 250 copies / mL. A negative result does not preclude SARS-CoV-2 infection and should not be used as the sole basis for treatment or other patient management decisions.  A negative result may occur with improper specimen collection / handling, submission of specimen other than nasopharyngeal swab, presence of viral mutation(s) within the areas targeted by this assay, and inadequate number of viral copies (<250 copies / mL). A negative result must be combined with clinical observations, patient history, and epidemiological information. Fact Sheet for Patients:   StrictlyIdeas.no Fact Sheet for Healthcare Providers: BankingDealers.co.za This test is not yet approved or cleared  by the [place] FDA and has been authorized for detection and/or diagnosis of SARS-CoV-2 by FDA under an Emergency Use Authorization (EUA).  This EUA will remain in effect (meaning this test can be used) for the duration of the COVID-19 declaration under Section 564(b)(1) of the Act, 21 U.S.C. section 360bbb-3(b)(1), unless the authorization is terminated or revoked sooner. Performed at [facility], [place] [address] [place], [place] [address]   Culture, blood (routine x 2)     Status: None (Preliminary result)   Collection Time: 10/15/19  5:16 PM   Specimen: BLOOD  Result Value Ref Range Status   Specimen Description BLOOD RIGHT ANTECUBITAL  Final   Special Requests   Final    BOTTLES DRAWN AEROBIC AND ANAEROBIC Blood Culture adequate  volume Performed at [facility] [address], [place], [place] [address]    Culture   Final    NO GROWTH 1 DAY Performed at [facility], [place] [address] [place], [place] [address]    Report Status  PENDING  Incomplete  Culture, blood (routine x 2)     Status: None (Preliminary result)   Collection Time: 10/15/19  5:16 PM   Specimen: BLOOD  Result Value Ref Range Status   Specimen Description BLOOD RIGHT ANTECUBITAL  Final   Special Requests   Final    BOTTLES DRAWN AEROBIC ONLY Blood Culture adequate volume Performed at [facility], [place] [address], [place], [place] [address]    Culture   Final    NO GROWTH 1 DAY Performed at [facility], [place] [address] [place], [place] [address]    Report Status PENDING  Incomplete      Radiology Studies: No results found.   LOS: 2 days   Antonieta Pert, MD [facility]  10/17/2019, 2:14 PM

## 2019-10-17 NOTE — TOC Transition Note (Signed)
Transition of Care [facility]) - CM/SW Discharge Note   Patient Details  Name: [name] MRN: [id number] Date of Birth: 1932-07-02  [facility]) CM/SW Contact:  Lennart Pall, LCSW Phone Number: 10/17/2019, 2:08 PM   Clinical Narrative:   Met with pt to review d/c needs. Pt very hopeful he will be cleared to d/c this afternoon.  Alerted by [facility] that they were providing HHPT services to pt PTA.  Pt would prefer to have a couple of PT visits at d/c as well as [place] for monitoring of leg - MD has placed orders.  [facility] aware and know he may d/c today or tomorrow.  No further TOC needs at this time.    Final next level of care: [place] Barriers to Discharge: Continued Medical Work up   Patient Goals and CMS Choice Patient states their goals for this hospitalization and ongoing recovery are:: to go home today - "I have some business to take care of"      Discharge Placement                       Discharge Plan and Services                DME Arranged: N/A DME Agency: NA       HH Arranged: RN, PT HH Agency: [facility] ([facility])(following patient prior to this admit) Date HH Agency Contacted: 10/17/19 Time HH Agency Contacted: 1407    Social Determinants of Health (SDOH) Interventions     Readmission Risk Interventions No flowsheet data found.

## 2019-10-17 NOTE — Progress Notes (Signed)
Physical Therapy Treatment Patient Details Name: [name] MRN: [id number] DOB: 10/26/1932 Today's Date: 10/17/2019    History of Present Illness Patient is 86 y.o. male who has a past medical history that is significant for hypertension, hyperlipidemia, bullous pemphigoid, obesity and chronic venous insufficiency. Over the months of March and April he developed a blister over his third toe on the right, was seen by Dr. [name] with ID, was given 4 weekly doses of oritavancin and also completed therapy with Cipro and doxy. He states that over the past 2 days he has noticed that the edema has worsened in the right leg and yesterday noticed redness of his leg, because of continued progression of edema, erythema and pain he decided to come to the hospital.    PT Comments    Patient is progressing steadily with acute therapy. He requires assist for power up from EOB and toilet as well as cues for safe proximity and management of RW of walker with gait. Cues for posture throughout and for step to pattern for safety as pt has tendency to allow walker to move ahead of him. Patient required 4 standing rest breaks during gait due to fatigue. He will continue to benefit from skilled PT interventions to address impairments and progress mobility.    Follow Up Recommendations  Outpatient PT;Other (comment)(OPPT/OP Wound care center for wrapping and wound dressing)     Equipment Recommendations  None recommended by PT    Recommendations for Other Services       Precautions / Restrictions Precautions Precautions: Fall Restrictions Weight Bearing Restrictions: No    Mobility  Bed Mobility          General bed mobility comments: pt sitting EOB at start of session.  Transfers Overall transfer level: Needs assistance Equipment used: Rolling walker (2 wheeled) Transfers: Sit to/from Stand Sit to Stand: Min assist         General transfer comment: cues for hand placement/technique for  power up. pt using bil UE on RW and assist to steady walker with rising. pt using grab bar for safe reach back to sit on toilet and for rising.   Ambulation/Gait Ambulation/Gait assistance: Min assist Gait Distance (Feet): 200 Feet Assistive device: Rolling walker (2 wheeled) Gait Pattern/deviations: Step-through pattern;Decreased step length - right;Decreased step length - left;Decreased stride length;Trunk flexed Gait velocity: decreased   General Gait Details: cues for safe proximityt to RW, to keep walker on floor. no overt LOB noted. chair follow provided as pt has history of Rt knee buckling.    Stairs        Wheelchair Mobility    Modified Rankin (Stroke Patients Only)       Balance Overall balance assessment: Needs assistance;History of Falls Sitting-balance support: Feet supported Sitting balance-Leahy Scale: Good     Standing balance support: During functional activity;Bilateral upper extremity supported Standing balance-Leahy Scale: Poor           Cognition Arousal/Alertness: Awake/alert Behavior During Therapy: WFL for tasks assessed/performed Overall Cognitive Status: Within Functional Limits for tasks assessed           Exercises      General Comments General comments (skin integrity, edema, etc.): pt with LE wrapped with ace bandage      Pertinent Vitals/Pain Pain Assessment: No/denies pain           PT Goals (current goals can now be found in the care plan section) Acute Rehab PT Goals Patient Stated Goal: to decrease swelling in  legs and be able to fit into compression stockings again. PT Goal Formulation: With patient Time For Goal Achievement: 10/23/19 Potential to Achieve Goals: Good Progress towards PT goals: Progressing toward goals    Frequency    Min 3X/week      PT Plan Current plan remains appropriate       AM-PAC PT "6 Clicks" Mobility   Outcome Measure  Help needed turning from your back to your side while in  a flat bed without using bedrails?: A Little Help needed moving from lying on your back to sitting on the side of a flat bed without using bedrails?: A Little Help needed moving to and from a bed to a chair (including a wheelchair)?: A Little Help needed standing up from a chair using your arms (e.g., wheelchair or bedside chair)?: A Little Help needed to walk in hospital room?: A Little Help needed climbing 3-5 steps with a railing? : A Lot 6 Click Score: 17    End of Session Equipment Utilized During Treatment: Gait belt Activity Tolerance: Patient tolerated treatment well Patient left: in chair;with call bell/phone within reach;with chair alarm set Nurse Communication: Mobility status PT Visit Diagnosis: Muscle weakness (generalized) (M62.81);Difficulty in walking, not elsewhere classified (R26.2);Unsteadiness on feet (R26.81)     Time: [id number] PT Time Calculation (min) (ACUTE ONLY): 28 min  Charges:  $Gait Training: 8-22 mins $Therapeutic Activity: 8-22 mins                     Verner Mould, DPT Physical Therapist with [facility] [phone]  10/17/2019 7:13 PM

## 2019-10-18 LAB — BASIC METABOLIC PANEL
Anion gap: 10 (ref 5–15)
BUN: 21 mg/dL (ref 8–23)
CO2: 27 mmol/L (ref 22–32)
Calcium: 8.9 mg/dL (ref 8.9–10.3)
Chloride: 99 mmol/L (ref 98–111)
Creatinine, Ser: 0.97 mg/dL (ref 0.61–1.24)
GFR calc Af Amer: >60 mL/min (ref >60)
GFR calc non Af Amer: >60 mL/min (ref >60)
Glucose, Bld: 99 mg/dL (ref 70–99)
Potassium: 4.2 mmol/L (ref 3.5–5.1)
Sodium: 136 mmol/L (ref 135–145)

## 2019-10-18 LAB — CBC
HCT: 35.1 % — ABNORMAL LOW (ref 39.0–52.0)
Hemoglobin: 11.6 g/dL — ABNORMAL LOW (ref 13.0–17.0)
MCH: 34.7 pg — ABNORMAL HIGH (ref 26.0–34.0)
MCHC: 33 g/dL (ref 30.0–36.0)
MCV: 105.1 fL — ABNORMAL HIGH (ref 80.0–100.0)
Platelets: 146 10*3/uL — ABNORMAL LOW (ref 150–400)
RBC: 3.34 MIL/uL — ABNORMAL LOW (ref 4.22–5.81)
RDW: 13.3 % (ref 11.5–15.5)
WBC: 5.7 10*3/uL (ref 4.0–10.5)
nRBC: 0 % (ref 0.0–0.2)

## 2019-10-18 MED ORDER — CEPHALEXIN 500 MG PO CAPS: 500.0000 mg | ORAL_CAPSULE | Freq: Four times a day (QID) | ORAL | 0 refills | Status: AC

## 2019-10-18 NOTE — Progress Notes (Signed)
Discharge instructions given to pt and all questions were answered.  

## 2019-10-18 NOTE — Care Management Important Message (Signed)
Important Message  Patient Details  Name: [name] MRN: [id number] Date of Birth: [birth year]   Medicare Important Message Given:   Given to Kathrin Greathouse, LCSW for the patient to sign     Crista Luria 10/18/2019, 10:24 AM

## 2019-10-18 NOTE — TOC Transition Note (Signed)
Transition of Care [facility]) - CM/SW Discharge Note   Patient Details  Name: [name] MRN: [id number] Date of Birth: 24-Sep-1932  Transition of Care [facility]) CM/SW Contact:  Lia Hopping, [place] Phone Number: 10/18/2019, 9:28 AM   Clinical Narrative:    [facility] made of aware of the patient discharge today.    Final next level of care: [place] Barriers to Discharge: Continued Medical Work up   Patient Goals and CMS Choice Patient states their goals for this hospitalization and ongoing recovery are:: to go home today - "I have some business to take care of"      Discharge Placement                       Discharge Plan and Services                DME Arranged: N/A DME Agency: NA       HH Arranged: RN, PT HH Agency: [facility] (formerly [facility])(following patient prior to this admit) Date HH Agency Contacted: 10/17/19 Time HH Agency Contacted: 1407    Social Determinants of Health (SDOH) Interventions     Readmission Risk Interventions No flowsheet data found.

## 2019-10-18 NOTE — Discharge Summary (Signed)
Physician Discharge Summary  [name] [id number] DOB: May 25, 1932 DOA: 10/15/2019  PCP: Lajean Manes, MD  Admit date: 10/15/2019 Discharge date: 10/18/2019  Admitted From: home Disposition: home Recommendations for Outpatient Follow-up:  1. Follow up with PCP in 1-2 weeks 2. Please obtain BMP/CBC in one week 3. Please follow up at wound clinic   Home Health:yes Equipment/Devices:none  Discharge Condition stable CODE STATUS:full Diet recommendation: cardiac Brief/Interim Summary:86 y.o.malewho has a past medical history that is significant for hypertension, hyperlipidemia, bullous pemphigoid, obesity and chronic venous insufficiency. He is on weekly methotrexate for his pemphigoid. Over the months of March and April he developed a blister over his third toe on the right, was seen by Dr. [name] with ID, was given 4 weekly doses oforitavancinand also completed therapy with Cipro and doxy. He states that over the past 2 days he has noticed that the edema that he typically has over his extremities at nighttime that goes away by morning has not been resolving on the right leg. Yesterday noticed redness of his leg, because of continued progression of edema, erythema and pain he decided to come to the hospital today. He has been afebrile. No leukocytosis. He does have some acute renal failure with a creatinine of 1.43. Doppler is negative for DVT. Admission has been requested for further evaluation and management.   Discharge Diagnoses:  Principal Problem:   Cellulitis of right leg Active Problems:   Hypertension   Hyperlipidemia   ARF (acute renal failure) (HCC)   Bullous pemphigoid  Purulent cellulitis of the right lower extremity: He was treated with Rocephin per cellulitis pathway on admission.  He was also given as needed Lasix to decrease the swelling in the right lower extremity which probably could have contributed to recurrent cellulitis.  I will discharge him on Keflex  for 10 days.  He will follow-up at the outpatient wound clinic for dressing changes and for care of the wound.  Venous Doppler was negative for DVT.  Hypertension, uncontrolled : Continue Lasix, clonidine and metoprolol ACE inhibitor and eplerenone.   Hyperlipidemia continue Lipitor  AKI : Resolved.    Bullous pemphigoid- on methotrex q Friday  Morbid Obesity with BMI 39, will benefit with weight loss and healthy lifestyle.   Estimated body mass index is 39.75 kg/m as calculated from the following:   Height as of this encounter: 5\' 11"  (1.803 m).   Weight as of this encounter: 129.3 kg.  Discharge Instructions  Discharge Instructions    Ambulatory referral to Physical Therapy   Complete by: As directed    Needs outpatient physical therapy and wound care right lower extremity dressing changes   Diet - low sodium heart healthy   Complete by: As directed    Increase activity slowly   Complete by: As directed      Allergies as of 10/18/2019      Reactions   Spironolactone    nausea   Hydrocodone Nausea And Vomiting      Medication List    STOP taking these medications   levofloxacin 500 MG tablet Commonly known as: Levaquin   mometasone 0.1 % ointment Commonly known as: ELOCON   pimecrolimus 1 % cream Commonly known as: ELIDEL     TAKE these medications   amLODipine 2.5 MG tablet Commonly known as: NORVASC Take 2.5 mg by mouth daily.   atorvastatin 10 MG tablet Commonly known as: LIPITOR Take 10 mg by mouth daily.   CENTRUM SILVER 50+MEN PO Take 1 tablet  by mouth daily.   cephALEXin 500 MG capsule Commonly known as: KEFLEX Take 1 capsule (500 mg total) by mouth 4 (four) times daily for 10 days.   cloNIDine 0.2 MG tablet Commonly known as: CATAPRES Take 0.2 mg by mouth 2 (two) times daily.   diclofenac 75 MG EC tablet Commonly known as: VOLTAREN Take 75 mg by mouth 2 (two) times daily.   eplerenone 25 MG tablet Commonly known as: INSPRA Take  25 mg by mouth daily.   finasteride 5 MG tablet Commonly known as: PROSCAR Take 5 mg by mouth every morning.   folic acid 1 MG tablet Commonly known as: FOLVITE Take 1 mg by mouth daily.   furosemide 40 MG tablet Commonly known as: LASIX Take 40 mg by mouth 2 (two) times daily.   gabapentin 300 MG capsule Commonly known as: NEURONTIN Take 300 mg by mouth 3 (three) times daily.   methotrexate 2.5 MG tablet Commonly known as: RHEUMATREX Take 10 mg by mouth once a week. Caution:Chemotherapy. Protect from light.  Friday   metoprolol succinate 100 MG 24 hr tablet Commonly known as: TOPROL-XL Take 150 mg by mouth every morning.   montelukast 10 MG tablet Commonly known as: SINGULAIR Take 10 mg by mouth daily.   omega-3 acid ethyl esters 1 g capsule Commonly known as: LOVAZA Take 1 g by mouth 2 (two) times daily.   polyethylene glycol 17 g packet Commonly known as: MIRALAX / GLYCOLAX Take 17 g by mouth daily as needed for mild constipation.   quinapril 40 MG tablet Commonly known as: ACCUPRIL Take 40 mg by mouth daily.   traMADol 50 MG tablet Commonly known as: ULTRAM Take 50 mg by mouth every 6 (six) hours as needed for moderate pain or severe pain.   Vitamin D3 125 MCG (5000 UT) Caps Take 5,000 Units by mouth daily.      Follow-up Information    [facility] Follow up.   Specialty: Home Health Services Why: to provide home health physical therapy and nursing Contact information: [place] [address] [phone]        Lajean Manes, MD Follow up.   Specialty: Internal Medicine Contact information: [address] [place] [address] [phone]          Allergies  Allergen Reactions  . Spironolactone     nausea  . Hydrocodone Nausea And Vomiting    Consultations:  none   Procedures/Studies: VAS [place] LOWER EXTREMITY VENOUS (DVT) (ONLY [facility] & [facility])  Result Date: 10/15/2019  Lower Venous DVTStudy  Indications: Edema.  Risk Factors: None identified. Limitations: Body habitus and poor ultrasound/tissue interface. Comparison Study: No prior studies. Performing Technologist: Oliver Hum RVT  Examination Guidelines: A complete evaluation includes B-mode imaging, spectral Doppler, color Doppler, and power Doppler as needed of all accessible portions of each vessel. Bilateral testing is considered an integral part of a complete examination. Limited examinations for reoccurring indications may be performed as noted. The reflux portion of the exam is performed with the patient in reverse Trendelenburg.  +---------+---------------+---------+-----------+----------+-------------------+ RIGHT    CompressibilityPhasicitySpontaneityPropertiesThrombus Aging      +---------+---------------+---------+-----------+----------+-------------------+ CFV      Full           Yes      Yes                                      +---------+---------------+---------+-----------+----------+-------------------+  SFJ      Full                                                             +---------+---------------+---------+-----------+----------+-------------------+ FV Prox  Full                                                             +---------+---------------+---------+-----------+----------+-------------------+ FV Mid   Full                                                             +---------+---------------+---------+-----------+----------+-------------------+ FV DistalFull                                                             +---------+---------------+---------+-----------+----------+-------------------+ PFV      Full                                                             +---------+---------------+---------+-----------+----------+-------------------+ POP      Full           Yes      Yes                                       +---------+---------------+---------+-----------+----------+-------------------+ PTV      Full                                                             +---------+---------------+---------+-----------+----------+-------------------+ PERO                                                  Patency shown with                                                        color doppler       +---------+---------------+---------+-----------+----------+-------------------+   +----+---------------+---------+-----------+----------+--------------+ LEFTCompressibilityPhasicitySpontaneityPropertiesThrombus Aging +----+---------------+---------+-----------+----------+--------------+ CFV Full           Yes  Yes                                 +----+---------------+---------+-----------+----------+--------------+     Summary: RIGHT: - There is no evidence of deep vein thrombosis in the lower extremity. However, portions of this examination were limited- see technologist comments above.  - No cystic structure found in the popliteal fossa.  LEFT: - No evidence of common femoral vein obstruction.  *See table(s) above for measurements and observations. Electronically signed by Harold Barban MD on 10/15/2019 at 1:13:24 PM.    Final     (Echo, Carotid, EGD, Colonoscopy, ERCP)    Subjective:  Resting in bed anxious to go home does not want to stay another day Discharge Exam: Vitals:   10/17/19 2044 10/18/19 0516  BP: (!) 156/73 (!) 162/91  Pulse: 62 66  Resp: 20 20  Temp: 97.8 F (36.6 C) (!) 97.5 F (36.4 C)  SpO2: 94% 98%   Vitals:   10/17/19 0649 10/17/19 1348 10/17/19 2044 10/18/19 0516  BP: (!) 158/86 (!) 156/95 (!) 156/73 (!) 162/91  Pulse: 60 64 62 66  Resp: 20 20 20 20   Temp: (!) 97.4 F (36.3 C) (!) 97.5 F (36.4 C) 97.8 F (36.6 C) (!) 97.5 F (36.4 C)  TempSrc: Oral  Oral Oral  SpO2: 97% 95% 94% 98%  Weight:      Height:        General: Pt is alert, awake, not  in acute distress Cardiovascular: RRR, S1/S2 +, no rubs, no gallops Respiratory: CTA bilaterally, no wheezing, no rhonchi Abdominal: Soft, NT, ND, bowel sounds + Extremities: Right lower extremity edema erythema covered with Ace bandage   The results of significant diagnostics from this hospitalization (including imaging, microbiology, ancillary and laboratory) are listed below for reference.     Microbiology: Recent Results (from the past 240 hour(s))  SARS Coronavirus 2 by RT PCR (hospital order, performed in [facility] hospital lab) Nasopharyngeal Nasopharyngeal Swab     Status: None   Collection Time: 10/15/19  2:31 PM   Specimen: Nasopharyngeal Swab  Result Value Ref Range Status   SARS Coronavirus 2 NEGATIVE NEGATIVE Final    Comment: (NOTE) SARS-CoV-2 target nucleic acids are NOT DETECTED. The SARS-CoV-2 RNA is generally detectable in upper and lower respiratory specimens during the acute phase of infection. The lowest concentration of SARS-CoV-2 viral copies this assay can detect is 250 copies / mL. A negative result does not preclude SARS-CoV-2 infection and should not be used as the sole basis for treatment or other patient management decisions.  A negative result may occur with improper specimen collection / handling, submission of specimen other than nasopharyngeal swab, presence of viral mutation(s) within the areas targeted by this assay, and inadequate number of viral copies (<250 copies / mL). A negative result must be combined with clinical observations, patient history, and epidemiological information. Fact Sheet for Patients:   StrictlyIdeas.no Fact Sheet for Healthcare Providers: BankingDealers.co.za This test is not yet approved or cleared  by the [place] FDA and has been authorized for detection and/or diagnosis of SARS-CoV-2 by FDA under an Emergency Use Authorization (EUA).  This EUA will remain in  effect (meaning this test can be used) for the duration of the COVID-19 declaration under Section 564(b)(1) of the Act, 21 U.S.C. section 360bbb-3(b)(1), unless the authorization is terminated or revoked sooner. Performed at [facility], [address]  [place], [place] [address]   Culture, blood (routine x 2)     Status: None (Preliminary result)   Collection Time: 10/15/19  5:16 PM   Specimen: BLOOD  Result Value Ref Range Status   Specimen Description BLOOD RIGHT ANTECUBITAL  Final   Special Requests   Final    BOTTLES DRAWN AEROBIC AND ANAEROBIC Blood Culture adequate volume Performed at [facility] [address], [place], [place] [address]    Culture   Final    NO GROWTH 2 DAYS Performed at [facility] [address], [place], [place] [address]    Report Status PENDING  Incomplete  Culture, blood (routine x 2)     Status: None (Preliminary result)   Collection Time: 10/15/19  5:16 PM   Specimen: BLOOD  Result Value Ref Range Status   Specimen Description BLOOD RIGHT ANTECUBITAL  Final   Special Requests   Final    BOTTLES DRAWN AEROBIC ONLY Blood Culture adequate volume Performed at [facility] [address] [place], [place] [address]    Culture   Final    NO GROWTH 2 DAYS Performed at [facility] [address], [place], [place] [address]    Report Status PENDING  Incomplete     Labs: BNP (last 3 results) No results for input(s): BNP in the last 8760 hours. Basic Metabolic Panel: Recent Labs  Lab 10/15/19 1158 10/16/19 0430 10/17/19 0352 10/18/19 0451  NA 134* 137 136 136  K 4.7 4.8 4.5 4.2  CL 100 104 101 99  CO2 26 25 26 27   GLUCOSE 124* 98 100* 99  BUN 38* 23 19 21   CREATININE 1.43* 0.97 0.94 0.97  CALCIUM 8.9 9.1 9.0 8.9   Liver Function Tests: Recent Labs  Lab 10/15/19 1158  AST 32  ALT 42  ALKPHOS 57  BILITOT 0.9  PROT 6.6  ALBUMIN 3.3*   No results for  input(s): LIPASE, AMYLASE in the last 168 hours. No results for input(s): AMMONIA in the last 168 hours. CBC: Recent Labs  Lab 10/15/19 1158 10/16/19 0430 10/17/19 0352 10/18/19 0451  WBC 6.5 5.3 5.6 5.7  NEUTROABS 5.2  --   --   --   HGB 11.9* 11.8* 11.3* 11.6*  HCT 35.7* 35.2* 34.1* 35.1*  MCV 105.0* 105.7* 104.9* 105.1*  PLT 162 162 162 146*   Cardiac Enzymes: No results for input(s): CKTOTAL, CKMB, CKMBINDEX, TROPONINI in the last 168 hours. BNP: Invalid input(s): POCBNP CBG: No results for input(s): GLUCAP in the last 168 hours. D-Dimer No results for input(s): DDIMER in the last 72 hours. Hgb A1c No results for input(s): HGBA1C in the last 72 hours. Lipid Profile No results for input(s): CHOL, HDL, LDLCALC, TRIG, CHOLHDL, LDLDIRECT in the last 72 hours. Thyroid function studies No results for input(s): TSH, T4TOTAL, T3FREE, THYROIDAB in the last 72 hours.  Invalid input(s): FREET3 Anemia work up No results for input(s): VITAMINB12, FOLATE, FERRITIN, TIBC, IRON, RETICCTPCT in the last 72 hours. Urinalysis    Component Value Date/Time   COLORURINE YELLOW 10/11/2012 1105   APPEARANCEUR CLEAR 10/11/2012 1105   LABSPEC 1.007 10/11/2012 1105   PHURINE 6.5 10/11/2012 1105   GLUCOSEU NEGATIVE 10/11/2012 1105   HGBUR NEGATIVE 10/11/2012 1105   BILIRUBINUR NEGATIVE 10/11/2012 1105   KETONESUR NEGATIVE 10/11/2012 1105   PROTEINUR NEGATIVE 10/11/2012 1105   UROBILINOGEN 0.2 10/11/2012 1105   NITRITE NEGATIVE 10/11/2012 1105   LEUKOCYTESUR NEGATIVE 10/11/2012 1105  Sepsis Labs Invalid input(s): PROCALCITONIN,  WBC,  LACTICIDVEN Microbiology Recent Results (from the past 240 hour(s))  SARS Coronavirus 2 by RT PCR (hospital order, performed in [facility] hospital lab) Nasopharyngeal Nasopharyngeal Swab     Status: None   Collection Time: 10/15/19  2:31 PM   Specimen: Nasopharyngeal Swab  Result Value Ref Range Status   SARS Coronavirus 2 NEGATIVE NEGATIVE Final     Comment: (NOTE) SARS-CoV-2 target nucleic acids are NOT DETECTED. The SARS-CoV-2 RNA is generally detectable in upper and lower respiratory specimens during the acute phase of infection. The lowest concentration of SARS-CoV-2 viral copies this assay can detect is 250 copies / mL. A negative result does not preclude SARS-CoV-2 infection and should not be used as the sole basis for treatment or other patient management decisions.  A negative result may occur with improper specimen collection / handling, submission of specimen other than nasopharyngeal swab, presence of viral mutation(s) within the areas targeted by this assay, and inadequate number of viral copies (<250 copies / mL). A negative result must be combined with clinical observations, patient history, and epidemiological information. Fact Sheet for Patients:   StrictlyIdeas.no Fact Sheet for Healthcare Providers: BankingDealers.co.za This test is not yet approved or cleared  by the [place] FDA and has been authorized for detection and/or diagnosis of SARS-CoV-2 by FDA under an Emergency Use Authorization (EUA).  This EUA will remain in effect (meaning this test can be used) for the duration of the COVID-19 declaration under Section 564(b)(1) of the Act, 21 U.S.C. section 360bbb-3(b)(1), unless the authorization is terminated or revoked sooner. Performed at [facility], [place] [address] [place], [place] [address]   Culture, blood (routine x 2)     Status: None (Preliminary result)   Collection Time: 10/15/19  5:16 PM   Specimen: BLOOD  Result Value Ref Range Status   Specimen Description BLOOD RIGHT ANTECUBITAL  Final   Special Requests   Final    BOTTLES DRAWN AEROBIC AND ANAEROBIC Blood Culture adequate volume Performed at [facility] [address] [place], [place] [address]    Culture   Final    NO GROWTH 2 DAYS Performed at  [facility] [address] [place], [place] [address]    Report Status PENDING  Incomplete  Culture, blood (routine x 2)     Status: None (Preliminary result)   Collection Time: 10/15/19  5:16 PM   Specimen: BLOOD  Result Value Ref Range Status   Specimen Description BLOOD RIGHT ANTECUBITAL  Final   Special Requests   Final    BOTTLES DRAWN AEROBIC ONLY Blood Culture adequate volume Performed at [facility] [address], [place], [place] [address]    Culture   Final    NO GROWTH 2 DAYS Performed at [facility] [address] [place], [place] [address]    Report Status PENDING  Incomplete     Time coordinating discharge:  39 minutes  SIGNED:   Georgette Shell, MD  [facility] 10/18/2019, 9:02 AM Pager   If 7PM-7AM, please contact night-coverage [email or web address] Password [email or web address]

## 2019-10-20 DIAGNOSIS — M25661 Stiffness of right knee, not elsewhere classified: Secondary | ICD-10-CM | POA: Diagnosis not present

## 2019-10-20 DIAGNOSIS — M7541 Impingement syndrome of right shoulder: Secondary | ICD-10-CM | POA: Diagnosis not present

## 2019-10-20 DIAGNOSIS — I872 Venous insufficiency (chronic) (peripheral): Secondary | ICD-10-CM | POA: Diagnosis not present

## 2019-10-20 DIAGNOSIS — I10 Essential (primary) hypertension: Secondary | ICD-10-CM | POA: Diagnosis not present

## 2019-10-20 DIAGNOSIS — M2352 Chronic instability of knee, left knee: Secondary | ICD-10-CM | POA: Diagnosis not present

## 2019-10-20 DIAGNOSIS — M25662 Stiffness of left knee, not elsewhere classified: Secondary | ICD-10-CM | POA: Diagnosis not present

## 2019-10-20 DIAGNOSIS — G8929 Other chronic pain: Secondary | ICD-10-CM | POA: Diagnosis not present

## 2019-10-20 DIAGNOSIS — M86071 Acute hematogenous osteomyelitis, right ankle and foot: Secondary | ICD-10-CM | POA: Diagnosis not present

## 2019-10-20 DIAGNOSIS — M2351 Chronic instability of knee, right knee: Secondary | ICD-10-CM | POA: Diagnosis not present

## 2019-10-21 LAB — CULTURE, BLOOD (ROUTINE X 2)
Culture: NO GROWTH
Culture: NO GROWTH
Special Requests: ADEQUATE
Special Requests: ADEQUATE

## 2019-10-23 DIAGNOSIS — M25662 Stiffness of left knee, not elsewhere classified: Secondary | ICD-10-CM | POA: Diagnosis not present

## 2019-10-23 DIAGNOSIS — I10 Essential (primary) hypertension: Secondary | ICD-10-CM | POA: Diagnosis not present

## 2019-10-23 DIAGNOSIS — M7541 Impingement syndrome of right shoulder: Secondary | ICD-10-CM | POA: Diagnosis not present

## 2019-10-23 DIAGNOSIS — M2352 Chronic instability of knee, left knee: Secondary | ICD-10-CM | POA: Diagnosis not present

## 2019-10-23 DIAGNOSIS — M2351 Chronic instability of knee, right knee: Secondary | ICD-10-CM | POA: Diagnosis not present

## 2019-10-23 DIAGNOSIS — M25661 Stiffness of right knee, not elsewhere classified: Secondary | ICD-10-CM | POA: Diagnosis not present

## 2019-10-23 DIAGNOSIS — G8929 Other chronic pain: Secondary | ICD-10-CM | POA: Diagnosis not present

## 2019-10-23 DIAGNOSIS — M86071 Acute hematogenous osteomyelitis, right ankle and foot: Secondary | ICD-10-CM | POA: Diagnosis not present

## 2019-10-23 DIAGNOSIS — I872 Venous insufficiency (chronic) (peripheral): Secondary | ICD-10-CM | POA: Diagnosis not present

## 2019-10-24 DIAGNOSIS — I872 Venous insufficiency (chronic) (peripheral): Secondary | ICD-10-CM | POA: Diagnosis not present

## 2019-10-24 DIAGNOSIS — M7541 Impingement syndrome of right shoulder: Secondary | ICD-10-CM | POA: Diagnosis not present

## 2019-10-24 DIAGNOSIS — M86071 Acute hematogenous osteomyelitis, right ankle and foot: Secondary | ICD-10-CM | POA: Diagnosis not present

## 2019-10-24 DIAGNOSIS — M2351 Chronic instability of knee, right knee: Secondary | ICD-10-CM | POA: Diagnosis not present

## 2019-10-24 DIAGNOSIS — G8929 Other chronic pain: Secondary | ICD-10-CM | POA: Diagnosis not present

## 2019-10-24 DIAGNOSIS — M25662 Stiffness of left knee, not elsewhere classified: Secondary | ICD-10-CM | POA: Diagnosis not present

## 2019-10-24 DIAGNOSIS — M2352 Chronic instability of knee, left knee: Secondary | ICD-10-CM | POA: Diagnosis not present

## 2019-10-24 DIAGNOSIS — M25661 Stiffness of right knee, not elsewhere classified: Secondary | ICD-10-CM | POA: Diagnosis not present

## 2019-10-24 DIAGNOSIS — I10 Essential (primary) hypertension: Secondary | ICD-10-CM | POA: Diagnosis not present

## 2019-10-26 DIAGNOSIS — M25662 Stiffness of left knee, not elsewhere classified: Secondary | ICD-10-CM | POA: Diagnosis not present

## 2019-10-26 DIAGNOSIS — M7541 Impingement syndrome of right shoulder: Secondary | ICD-10-CM | POA: Diagnosis not present

## 2019-10-26 DIAGNOSIS — G8929 Other chronic pain: Secondary | ICD-10-CM | POA: Diagnosis not present

## 2019-10-26 DIAGNOSIS — M86071 Acute hematogenous osteomyelitis, right ankle and foot: Secondary | ICD-10-CM | POA: Diagnosis not present

## 2019-10-26 DIAGNOSIS — I10 Essential (primary) hypertension: Secondary | ICD-10-CM | POA: Diagnosis not present

## 2019-10-26 DIAGNOSIS — M25661 Stiffness of right knee, not elsewhere classified: Secondary | ICD-10-CM | POA: Diagnosis not present

## 2019-10-26 DIAGNOSIS — I872 Venous insufficiency (chronic) (peripheral): Secondary | ICD-10-CM | POA: Diagnosis not present

## 2019-10-26 DIAGNOSIS — M2351 Chronic instability of knee, right knee: Secondary | ICD-10-CM | POA: Diagnosis not present

## 2019-10-26 DIAGNOSIS — M2352 Chronic instability of knee, left knee: Secondary | ICD-10-CM | POA: Diagnosis not present

## 2019-10-27 DIAGNOSIS — M7541 Impingement syndrome of right shoulder: Secondary | ICD-10-CM | POA: Diagnosis not present

## 2019-10-27 DIAGNOSIS — I872 Venous insufficiency (chronic) (peripheral): Secondary | ICD-10-CM | POA: Diagnosis not present

## 2019-10-27 DIAGNOSIS — M25661 Stiffness of right knee, not elsewhere classified: Secondary | ICD-10-CM | POA: Diagnosis not present

## 2019-10-27 DIAGNOSIS — M2352 Chronic instability of knee, left knee: Secondary | ICD-10-CM | POA: Diagnosis not present

## 2019-10-27 DIAGNOSIS — M25662 Stiffness of left knee, not elsewhere classified: Secondary | ICD-10-CM | POA: Diagnosis not present

## 2019-10-27 DIAGNOSIS — M2351 Chronic instability of knee, right knee: Secondary | ICD-10-CM | POA: Diagnosis not present

## 2019-10-27 DIAGNOSIS — M86071 Acute hematogenous osteomyelitis, right ankle and foot: Secondary | ICD-10-CM | POA: Diagnosis not present

## 2019-10-27 DIAGNOSIS — G8929 Other chronic pain: Secondary | ICD-10-CM | POA: Diagnosis not present

## 2019-10-27 DIAGNOSIS — I10 Essential (primary) hypertension: Secondary | ICD-10-CM | POA: Diagnosis not present

## 2019-10-30 DIAGNOSIS — G8929 Other chronic pain: Secondary | ICD-10-CM | POA: Diagnosis not present

## 2019-10-30 DIAGNOSIS — I872 Venous insufficiency (chronic) (peripheral): Secondary | ICD-10-CM | POA: Diagnosis not present

## 2019-10-30 DIAGNOSIS — I10 Essential (primary) hypertension: Secondary | ICD-10-CM | POA: Diagnosis not present

## 2019-10-30 DIAGNOSIS — M7541 Impingement syndrome of right shoulder: Secondary | ICD-10-CM | POA: Diagnosis not present

## 2019-10-30 DIAGNOSIS — M25662 Stiffness of left knee, not elsewhere classified: Secondary | ICD-10-CM | POA: Diagnosis not present

## 2019-10-30 DIAGNOSIS — M2351 Chronic instability of knee, right knee: Secondary | ICD-10-CM | POA: Diagnosis not present

## 2019-10-30 DIAGNOSIS — M86071 Acute hematogenous osteomyelitis, right ankle and foot: Secondary | ICD-10-CM | POA: Diagnosis not present

## 2019-10-30 DIAGNOSIS — M2352 Chronic instability of knee, left knee: Secondary | ICD-10-CM | POA: Diagnosis not present

## 2019-10-30 DIAGNOSIS — M25661 Stiffness of right knee, not elsewhere classified: Secondary | ICD-10-CM | POA: Diagnosis not present

## 2019-10-31 DIAGNOSIS — M2041 Other hammer toe(s) (acquired), right foot: Secondary | ICD-10-CM | POA: Diagnosis not present

## 2019-10-31 DIAGNOSIS — S41111A Laceration without foreign body of right upper arm, initial encounter: Secondary | ICD-10-CM | POA: Diagnosis not present

## 2019-10-31 DIAGNOSIS — I10 Essential (primary) hypertension: Secondary | ICD-10-CM | POA: Diagnosis not present

## 2019-10-31 DIAGNOSIS — D692 Other nonthrombocytopenic purpura: Secondary | ICD-10-CM | POA: Diagnosis not present

## 2019-10-31 DIAGNOSIS — I872 Venous insufficiency (chronic) (peripheral): Secondary | ICD-10-CM | POA: Diagnosis not present

## 2019-10-31 DIAGNOSIS — L120 Bullous pemphigoid: Secondary | ICD-10-CM | POA: Diagnosis not present

## 2019-11-01 DIAGNOSIS — M2351 Chronic instability of knee, right knee: Secondary | ICD-10-CM | POA: Diagnosis not present

## 2019-11-01 DIAGNOSIS — G8929 Other chronic pain: Secondary | ICD-10-CM | POA: Diagnosis not present

## 2019-11-01 DIAGNOSIS — M86071 Acute hematogenous osteomyelitis, right ankle and foot: Secondary | ICD-10-CM | POA: Diagnosis not present

## 2019-11-01 DIAGNOSIS — M25661 Stiffness of right knee, not elsewhere classified: Secondary | ICD-10-CM | POA: Diagnosis not present

## 2019-11-01 DIAGNOSIS — M2352 Chronic instability of knee, left knee: Secondary | ICD-10-CM | POA: Diagnosis not present

## 2019-11-01 DIAGNOSIS — I10 Essential (primary) hypertension: Secondary | ICD-10-CM | POA: Diagnosis not present

## 2019-11-01 DIAGNOSIS — M7541 Impingement syndrome of right shoulder: Secondary | ICD-10-CM | POA: Diagnosis not present

## 2019-11-01 DIAGNOSIS — M25662 Stiffness of left knee, not elsewhere classified: Secondary | ICD-10-CM | POA: Diagnosis not present

## 2019-11-01 DIAGNOSIS — I872 Venous insufficiency (chronic) (peripheral): Secondary | ICD-10-CM | POA: Diagnosis not present

## 2019-11-02 DIAGNOSIS — M2041 Other hammer toe(s) (acquired), right foot: Secondary | ICD-10-CM | POA: Diagnosis not present

## 2019-11-02 DIAGNOSIS — L84 Corns and callosities: Secondary | ICD-10-CM | POA: Diagnosis not present

## 2019-11-03 DIAGNOSIS — M2351 Chronic instability of knee, right knee: Secondary | ICD-10-CM | POA: Diagnosis not present

## 2019-11-03 DIAGNOSIS — I10 Essential (primary) hypertension: Secondary | ICD-10-CM | POA: Diagnosis not present

## 2019-11-03 DIAGNOSIS — M25661 Stiffness of right knee, not elsewhere classified: Secondary | ICD-10-CM | POA: Diagnosis not present

## 2019-11-03 DIAGNOSIS — M86071 Acute hematogenous osteomyelitis, right ankle and foot: Secondary | ICD-10-CM | POA: Diagnosis not present

## 2019-11-03 DIAGNOSIS — M2352 Chronic instability of knee, left knee: Secondary | ICD-10-CM | POA: Diagnosis not present

## 2019-11-03 DIAGNOSIS — M7541 Impingement syndrome of right shoulder: Secondary | ICD-10-CM | POA: Diagnosis not present

## 2019-11-03 DIAGNOSIS — G8929 Other chronic pain: Secondary | ICD-10-CM | POA: Diagnosis not present

## 2019-11-03 DIAGNOSIS — M25662 Stiffness of left knee, not elsewhere classified: Secondary | ICD-10-CM | POA: Diagnosis not present

## 2019-11-03 DIAGNOSIS — I872 Venous insufficiency (chronic) (peripheral): Secondary | ICD-10-CM | POA: Diagnosis not present

## 2019-11-07 DIAGNOSIS — M2352 Chronic instability of knee, left knee: Secondary | ICD-10-CM | POA: Diagnosis not present

## 2019-11-07 DIAGNOSIS — G8929 Other chronic pain: Secondary | ICD-10-CM | POA: Diagnosis not present

## 2019-11-07 DIAGNOSIS — M2351 Chronic instability of knee, right knee: Secondary | ICD-10-CM | POA: Diagnosis not present

## 2019-11-07 DIAGNOSIS — I10 Essential (primary) hypertension: Secondary | ICD-10-CM | POA: Diagnosis not present

## 2019-11-07 DIAGNOSIS — M86071 Acute hematogenous osteomyelitis, right ankle and foot: Secondary | ICD-10-CM | POA: Diagnosis not present

## 2019-11-07 DIAGNOSIS — M25661 Stiffness of right knee, not elsewhere classified: Secondary | ICD-10-CM | POA: Diagnosis not present

## 2019-11-07 DIAGNOSIS — M25662 Stiffness of left knee, not elsewhere classified: Secondary | ICD-10-CM | POA: Diagnosis not present

## 2019-11-07 DIAGNOSIS — I872 Venous insufficiency (chronic) (peripheral): Secondary | ICD-10-CM | POA: Diagnosis not present

## 2019-11-07 DIAGNOSIS — M7541 Impingement syndrome of right shoulder: Secondary | ICD-10-CM | POA: Diagnosis not present

## 2019-11-14 DIAGNOSIS — I872 Venous insufficiency (chronic) (peripheral): Secondary | ICD-10-CM | POA: Diagnosis not present

## 2019-11-14 DIAGNOSIS — L0889 Other specified local infections of the skin and subcutaneous tissue: Secondary | ICD-10-CM | POA: Diagnosis not present

## 2019-11-14 DIAGNOSIS — I8312 Varicose veins of left lower extremity with inflammation: Secondary | ICD-10-CM | POA: Diagnosis not present

## 2019-11-14 DIAGNOSIS — Z79899 Other long term (current) drug therapy: Secondary | ICD-10-CM | POA: Diagnosis not present

## 2019-11-14 DIAGNOSIS — Z85828 Personal history of other malignant neoplasm of skin: Secondary | ICD-10-CM | POA: Diagnosis not present

## 2019-11-14 DIAGNOSIS — I8311 Varicose veins of right lower extremity with inflammation: Secondary | ICD-10-CM | POA: Diagnosis not present

## 2019-11-14 DIAGNOSIS — L120 Bullous pemphigoid: Secondary | ICD-10-CM | POA: Diagnosis not present

## 2019-11-17 ENCOUNTER — Encounter: Payer: Self-pay | Admitting: Infectious Diseases

## 2019-11-17 ENCOUNTER — Ambulatory Visit: Payer: Medicare PPO | Admitting: Infectious Diseases

## 2019-11-17 ENCOUNTER — Other Ambulatory Visit: Payer: Self-pay

## 2019-11-17 DIAGNOSIS — L03031 Cellulitis of right toe: Secondary | ICD-10-CM | POA: Diagnosis not present

## 2019-11-17 DIAGNOSIS — L02611 Cutaneous abscess of right foot: Secondary | ICD-10-CM | POA: Diagnosis not present

## 2019-11-17 NOTE — Progress Notes (Signed)
   Subjective:    Patient ID: [name], male    DOB: May 10, 1933, 86 y.o.   MRN: [id number]  HPI 86 yo M with hx of bullous phemigod currently managed with methotrexate. This was reduced last fall. He then developed a blister on his R third toe as well as area on his foot pad. He believes these became infected.  He had MRI that showed (06-16-19):1. Cellulitis of the third toe with early osteomyelitis of the third distal phalanx. No drainable fluid collection. 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   He was seen in ID on 2-4 and was given 4 doses of oritavancin, 1 week apart each.  At his f/u 07-25-19 he was placed on cipro-doxy. He completed his po anbx 4-8.  He was adm to hospital 5-30 with worsening erythema and swelling of his RLE. He had doppler (-) for DVT. He was treated with ceftriaxone and after initial worsening of his Cr improved, he was given lasix to help with edema.  He was d/c home on 6-2 to complete 10 days of keflex.  He was seen by Dr Felipa Eth ~ 10 days ago and was then seen by [facility] for a callus on his foot. He had this scraped and then believes he had a nick to his skin. He has noticed small amt of d/c to his skin daily after that.  He was restarted on keflex by his PCP- took first dose 11-16-19.  No f/c. No proximal erythema. Has noted worsening edema in his opposite leg. Has not put his stockings yet today. (They make him itch, uses "sarna lotion" at night to relieve this).    Review of Systems  Constitutional: Negative for chills and fever.  HENT: Negative for mouth sores.   Cardiovascular: Positive for leg swelling.  Gastrointestinal: Negative for constipation and diarrhea.  Genitourinary: Negative for difficulty urinating.  Skin: Positive for wound.  Please see HPI. All other systems reviewed and negative.     Objective:   Physical Exam Vitals reviewed.  Constitutional:      Appearance: He is obese.  HENT:     Mouth/Throat:     Mouth: Mucous  membranes are moist.     Pharynx: No oropharyngeal exudate.  Eyes:     Extraocular Movements: Extraocular movements intact.     Pupils: Pupils are equal, round, and reactive to light.  Cardiovascular:     Rate and Rhythm: Normal rate and regular rhythm.     Pulses:          Dorsalis pedis pulses are 2+ on the right side.  Pulmonary:     Effort: Pulmonary effort is normal.     Breath sounds: Normal breath sounds.  Abdominal:     General: Bowel sounds are normal. There is no distension.     Palpations: Abdomen is soft.     Tenderness: There is no abdominal tenderness.  Musculoskeletal:     Right lower leg: Edema present.     Left lower leg: Edema present.       Feet:  Neurological:     General: No focal deficit present.     Mental Status: He is alert.  Psychiatric:        Mood and Affect: Mood normal.            Assessment & Plan:

## 2019-11-17 NOTE — Assessment & Plan Note (Signed)
He has a wound on his R 3rd toe which is clean, has no d/c, no increase in heat and is non-tender. His edema is matched by his L LE.  I've asked him to keep his legs elevated. He needs to wear his compression socks.  We will wrap his wound. Asked him to apply neosporin bid til wound closes.  Will continue his keflex for the written 12 days and see him back at that point. I have asked him to call if any worsening.

## 2019-11-30 ENCOUNTER — Ambulatory Visit: Payer: Medicare PPO | Admitting: Infectious Diseases

## 2019-11-30 ENCOUNTER — Other Ambulatory Visit: Payer: Self-pay

## 2019-11-30 ENCOUNTER — Encounter: Payer: Self-pay | Admitting: Infectious Diseases

## 2019-11-30 VITALS — BP 171/78 | HR 66 | Temp 97.5°F | Wt 287.0 lb

## 2019-11-30 DIAGNOSIS — L03031 Cellulitis of right toe: Secondary | ICD-10-CM

## 2019-11-30 DIAGNOSIS — R600 Localized edema: Secondary | ICD-10-CM | POA: Diagnosis not present

## 2019-11-30 DIAGNOSIS — M86071 Acute hematogenous osteomyelitis, right ankle and foot: Secondary | ICD-10-CM

## 2019-11-30 DIAGNOSIS — L02611 Cutaneous abscess of right foot: Secondary | ICD-10-CM | POA: Diagnosis not present

## 2019-11-30 NOTE — Progress Notes (Signed)
   Subjective:    Patient ID: [name], male    DOB: 1932/07/23, 86 y.o.   MRN: [id number]  HPI 86 yo M with hx of bullous phemigod currently managed with methotrexate. This was reduced last fall. He then developed a blister on his R third toe as well as area on his foot pad. He believes these became infected.  He had MRI that showed (06-16-19):1. Cellulitis of the third toe with early osteomyelitis of the third distal phalanx. No drainable fluid collection. 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  He was seen in ID on 2-4 and was given 4 doses of oritavancin, 1 week apart each. At his f/u 07-25-19 he was placed on cipro-doxy. He completed his po anbx 4-8.  He was adm to hospital 5-30 with worsening erythema and swelling of his RLE. He had doppler (-) for DVT. He was treated with ceftriaxone and after initial worsening of his Cr improved, he was given lasix to help with edema.  He was d/c home on 6-2 to complete 10 days of keflex.  He was seen by Dr Felipa Eth ~ 10 days ago and was then seen by [facility] for a callus on his foot. He had this scraped and then believes he had a nick to his skin. He has noticed small amt of d/c to his skin daily after that.  He was restarted on keflex by his PCP- took first dose 11-16-19.   He was seen in ID f/u 7-2 and continued on his keflex. Encouraged to do local wound care.   He states swelling in his legs is about the same. Down to normal in AM, worsens as day progresses.  He completed his anbx this week. He noted no change in the erythema in his legs.   He c/o allergies- he has had runny nose, sore throat. Also thinks that Crestor makes his nose run.   Review of Systems  Constitutional: Negative for chills and fever.  Respiratory: Negative for cough and shortness of breath.   Cardiovascular: Positive for leg swelling. Negative for chest pain.  Musculoskeletal: Positive for back pain.  Skin: Positive for wound.  Neurological: Negative for  headaches.       Objective:   Physical Exam Vitals reviewed.  Constitutional:      Appearance: Normal appearance. He is obese.  Eyes:     Extraocular Movements: Extraocular movements intact.  Musculoskeletal:     Right lower leg: Edema present.     Left lower leg: Edema present.       Feet:  Neurological:     General: No focal deficit present.     Mental Status: He is alert.  Psychiatric:        Mood and Affect: Mood normal.            Assessment & Plan:

## 2019-11-30 NOTE — Assessment & Plan Note (Signed)
He is doing better.  He wants to see vascular-vein (referral sent) I encouraged him to see podiatry for care of his callus and toenails.  Will watch off anbx for now.  Fluid mgmt per pcp.  rtc prn

## 2019-12-05 ENCOUNTER — Ambulatory Visit: Payer: Medicare PPO | Admitting: Infectious Diseases

## 2019-12-08 DIAGNOSIS — L120 Bullous pemphigoid: Secondary | ICD-10-CM | POA: Diagnosis not present

## 2019-12-08 DIAGNOSIS — L03115 Cellulitis of right lower limb: Secondary | ICD-10-CM | POA: Diagnosis not present

## 2019-12-08 DIAGNOSIS — I10 Essential (primary) hypertension: Secondary | ICD-10-CM | POA: Diagnosis not present

## 2019-12-10 DIAGNOSIS — M2041 Other hammer toe(s) (acquired), right foot: Secondary | ICD-10-CM | POA: Diagnosis not present

## 2019-12-10 DIAGNOSIS — L97811 Non-pressure chronic ulcer of other part of right lower leg limited to breakdown of skin: Secondary | ICD-10-CM | POA: Diagnosis not present

## 2019-12-10 DIAGNOSIS — E785 Hyperlipidemia, unspecified: Secondary | ICD-10-CM | POA: Diagnosis not present

## 2019-12-10 DIAGNOSIS — F410 Panic disorder [episodic paroxysmal anxiety] without agoraphobia: Secondary | ICD-10-CM | POA: Diagnosis not present

## 2019-12-10 DIAGNOSIS — L120 Bullous pemphigoid: Secondary | ICD-10-CM | POA: Diagnosis not present

## 2019-12-10 DIAGNOSIS — L03115 Cellulitis of right lower limb: Secondary | ICD-10-CM | POA: Diagnosis not present

## 2019-12-10 DIAGNOSIS — I10 Essential (primary) hypertension: Secondary | ICD-10-CM | POA: Diagnosis not present

## 2019-12-10 DIAGNOSIS — I872 Venous insufficiency (chronic) (peripheral): Secondary | ICD-10-CM | POA: Diagnosis not present

## 2019-12-10 DIAGNOSIS — L97211 Non-pressure chronic ulcer of right calf limited to breakdown of skin: Secondary | ICD-10-CM | POA: Diagnosis not present

## 2019-12-12 DIAGNOSIS — I10 Essential (primary) hypertension: Secondary | ICD-10-CM | POA: Diagnosis not present

## 2019-12-12 DIAGNOSIS — I872 Venous insufficiency (chronic) (peripheral): Secondary | ICD-10-CM | POA: Diagnosis not present

## 2019-12-12 DIAGNOSIS — L97211 Non-pressure chronic ulcer of right calf limited to breakdown of skin: Secondary | ICD-10-CM | POA: Diagnosis not present

## 2019-12-12 DIAGNOSIS — L97811 Non-pressure chronic ulcer of other part of right lower leg limited to breakdown of skin: Secondary | ICD-10-CM | POA: Diagnosis not present

## 2019-12-12 DIAGNOSIS — L120 Bullous pemphigoid: Secondary | ICD-10-CM | POA: Diagnosis not present

## 2019-12-12 DIAGNOSIS — F410 Panic disorder [episodic paroxysmal anxiety] without agoraphobia: Secondary | ICD-10-CM | POA: Diagnosis not present

## 2019-12-12 DIAGNOSIS — M2041 Other hammer toe(s) (acquired), right foot: Secondary | ICD-10-CM | POA: Diagnosis not present

## 2019-12-12 DIAGNOSIS — E785 Hyperlipidemia, unspecified: Secondary | ICD-10-CM | POA: Diagnosis not present

## 2019-12-12 DIAGNOSIS — L03115 Cellulitis of right lower limb: Secondary | ICD-10-CM | POA: Diagnosis not present

## 2019-12-15 DIAGNOSIS — I10 Essential (primary) hypertension: Secondary | ICD-10-CM | POA: Diagnosis not present

## 2019-12-15 DIAGNOSIS — F410 Panic disorder [episodic paroxysmal anxiety] without agoraphobia: Secondary | ICD-10-CM | POA: Diagnosis not present

## 2019-12-15 DIAGNOSIS — E785 Hyperlipidemia, unspecified: Secondary | ICD-10-CM | POA: Diagnosis not present

## 2019-12-15 DIAGNOSIS — L97211 Non-pressure chronic ulcer of right calf limited to breakdown of skin: Secondary | ICD-10-CM | POA: Diagnosis not present

## 2019-12-15 DIAGNOSIS — L03115 Cellulitis of right lower limb: Secondary | ICD-10-CM | POA: Diagnosis not present

## 2019-12-15 DIAGNOSIS — M2041 Other hammer toe(s) (acquired), right foot: Secondary | ICD-10-CM | POA: Diagnosis not present

## 2019-12-15 DIAGNOSIS — I872 Venous insufficiency (chronic) (peripheral): Secondary | ICD-10-CM | POA: Diagnosis not present

## 2019-12-15 DIAGNOSIS — L97811 Non-pressure chronic ulcer of other part of right lower leg limited to breakdown of skin: Secondary | ICD-10-CM | POA: Diagnosis not present

## 2019-12-15 DIAGNOSIS — L120 Bullous pemphigoid: Secondary | ICD-10-CM | POA: Diagnosis not present

## 2019-12-18 DIAGNOSIS — M2041 Other hammer toe(s) (acquired), right foot: Secondary | ICD-10-CM | POA: Diagnosis not present

## 2019-12-18 DIAGNOSIS — F410 Panic disorder [episodic paroxysmal anxiety] without agoraphobia: Secondary | ICD-10-CM | POA: Diagnosis not present

## 2019-12-18 DIAGNOSIS — L03115 Cellulitis of right lower limb: Secondary | ICD-10-CM | POA: Diagnosis not present

## 2019-12-18 DIAGNOSIS — E785 Hyperlipidemia, unspecified: Secondary | ICD-10-CM | POA: Diagnosis not present

## 2019-12-18 DIAGNOSIS — I10 Essential (primary) hypertension: Secondary | ICD-10-CM | POA: Diagnosis not present

## 2019-12-18 DIAGNOSIS — L97211 Non-pressure chronic ulcer of right calf limited to breakdown of skin: Secondary | ICD-10-CM | POA: Diagnosis not present

## 2019-12-18 DIAGNOSIS — L97811 Non-pressure chronic ulcer of other part of right lower leg limited to breakdown of skin: Secondary | ICD-10-CM | POA: Diagnosis not present

## 2019-12-18 DIAGNOSIS — I872 Venous insufficiency (chronic) (peripheral): Secondary | ICD-10-CM | POA: Diagnosis not present

## 2019-12-18 DIAGNOSIS — L120 Bullous pemphigoid: Secondary | ICD-10-CM | POA: Diagnosis not present

## 2019-12-21 DIAGNOSIS — I10 Essential (primary) hypertension: Secondary | ICD-10-CM | POA: Diagnosis not present

## 2019-12-21 DIAGNOSIS — L03115 Cellulitis of right lower limb: Secondary | ICD-10-CM | POA: Diagnosis not present

## 2019-12-21 DIAGNOSIS — I872 Venous insufficiency (chronic) (peripheral): Secondary | ICD-10-CM | POA: Diagnosis not present

## 2019-12-21 DIAGNOSIS — E785 Hyperlipidemia, unspecified: Secondary | ICD-10-CM | POA: Diagnosis not present

## 2019-12-21 DIAGNOSIS — L97211 Non-pressure chronic ulcer of right calf limited to breakdown of skin: Secondary | ICD-10-CM | POA: Diagnosis not present

## 2019-12-21 DIAGNOSIS — L120 Bullous pemphigoid: Secondary | ICD-10-CM | POA: Diagnosis not present

## 2019-12-21 DIAGNOSIS — F410 Panic disorder [episodic paroxysmal anxiety] without agoraphobia: Secondary | ICD-10-CM | POA: Diagnosis not present

## 2019-12-21 DIAGNOSIS — M2041 Other hammer toe(s) (acquired), right foot: Secondary | ICD-10-CM | POA: Diagnosis not present

## 2019-12-21 DIAGNOSIS — L97811 Non-pressure chronic ulcer of other part of right lower leg limited to breakdown of skin: Secondary | ICD-10-CM | POA: Diagnosis not present

## 2019-12-26 DIAGNOSIS — M2041 Other hammer toe(s) (acquired), right foot: Secondary | ICD-10-CM | POA: Diagnosis not present

## 2019-12-26 DIAGNOSIS — I10 Essential (primary) hypertension: Secondary | ICD-10-CM | POA: Diagnosis not present

## 2019-12-26 DIAGNOSIS — L03115 Cellulitis of right lower limb: Secondary | ICD-10-CM | POA: Diagnosis not present

## 2019-12-26 DIAGNOSIS — I872 Venous insufficiency (chronic) (peripheral): Secondary | ICD-10-CM | POA: Diagnosis not present

## 2019-12-26 DIAGNOSIS — L97211 Non-pressure chronic ulcer of right calf limited to breakdown of skin: Secondary | ICD-10-CM | POA: Diagnosis not present

## 2019-12-26 DIAGNOSIS — L120 Bullous pemphigoid: Secondary | ICD-10-CM | POA: Diagnosis not present

## 2019-12-26 DIAGNOSIS — E785 Hyperlipidemia, unspecified: Secondary | ICD-10-CM | POA: Diagnosis not present

## 2019-12-26 DIAGNOSIS — L97811 Non-pressure chronic ulcer of other part of right lower leg limited to breakdown of skin: Secondary | ICD-10-CM | POA: Diagnosis not present

## 2019-12-26 DIAGNOSIS — F410 Panic disorder [episodic paroxysmal anxiety] without agoraphobia: Secondary | ICD-10-CM | POA: Diagnosis not present

## 2019-12-28 DIAGNOSIS — I872 Venous insufficiency (chronic) (peripheral): Secondary | ICD-10-CM | POA: Diagnosis not present

## 2019-12-28 DIAGNOSIS — E785 Hyperlipidemia, unspecified: Secondary | ICD-10-CM | POA: Diagnosis not present

## 2019-12-28 DIAGNOSIS — L97211 Non-pressure chronic ulcer of right calf limited to breakdown of skin: Secondary | ICD-10-CM | POA: Diagnosis not present

## 2019-12-28 DIAGNOSIS — L97811 Non-pressure chronic ulcer of other part of right lower leg limited to breakdown of skin: Secondary | ICD-10-CM | POA: Diagnosis not present

## 2019-12-28 DIAGNOSIS — M2041 Other hammer toe(s) (acquired), right foot: Secondary | ICD-10-CM | POA: Diagnosis not present

## 2019-12-28 DIAGNOSIS — L03115 Cellulitis of right lower limb: Secondary | ICD-10-CM | POA: Diagnosis not present

## 2019-12-28 DIAGNOSIS — L120 Bullous pemphigoid: Secondary | ICD-10-CM | POA: Diagnosis not present

## 2019-12-28 DIAGNOSIS — I10 Essential (primary) hypertension: Secondary | ICD-10-CM | POA: Diagnosis not present

## 2019-12-28 DIAGNOSIS — F410 Panic disorder [episodic paroxysmal anxiety] without agoraphobia: Secondary | ICD-10-CM | POA: Diagnosis not present

## 2020-01-02 DIAGNOSIS — M2041 Other hammer toe(s) (acquired), right foot: Secondary | ICD-10-CM | POA: Diagnosis not present

## 2020-01-02 DIAGNOSIS — I10 Essential (primary) hypertension: Secondary | ICD-10-CM | POA: Diagnosis not present

## 2020-01-02 DIAGNOSIS — L97811 Non-pressure chronic ulcer of other part of right lower leg limited to breakdown of skin: Secondary | ICD-10-CM | POA: Diagnosis not present

## 2020-01-02 DIAGNOSIS — I872 Venous insufficiency (chronic) (peripheral): Secondary | ICD-10-CM | POA: Diagnosis not present

## 2020-01-02 DIAGNOSIS — L120 Bullous pemphigoid: Secondary | ICD-10-CM | POA: Diagnosis not present

## 2020-01-02 DIAGNOSIS — F410 Panic disorder [episodic paroxysmal anxiety] without agoraphobia: Secondary | ICD-10-CM | POA: Diagnosis not present

## 2020-01-02 DIAGNOSIS — L03115 Cellulitis of right lower limb: Secondary | ICD-10-CM | POA: Diagnosis not present

## 2020-01-02 DIAGNOSIS — E785 Hyperlipidemia, unspecified: Secondary | ICD-10-CM | POA: Diagnosis not present

## 2020-01-02 DIAGNOSIS — L97211 Non-pressure chronic ulcer of right calf limited to breakdown of skin: Secondary | ICD-10-CM | POA: Diagnosis not present

## 2020-01-05 DIAGNOSIS — E785 Hyperlipidemia, unspecified: Secondary | ICD-10-CM | POA: Diagnosis not present

## 2020-01-05 DIAGNOSIS — L97211 Non-pressure chronic ulcer of right calf limited to breakdown of skin: Secondary | ICD-10-CM | POA: Diagnosis not present

## 2020-01-05 DIAGNOSIS — L97811 Non-pressure chronic ulcer of other part of right lower leg limited to breakdown of skin: Secondary | ICD-10-CM | POA: Diagnosis not present

## 2020-01-05 DIAGNOSIS — L03115 Cellulitis of right lower limb: Secondary | ICD-10-CM | POA: Diagnosis not present

## 2020-01-05 DIAGNOSIS — F410 Panic disorder [episodic paroxysmal anxiety] without agoraphobia: Secondary | ICD-10-CM | POA: Diagnosis not present

## 2020-01-05 DIAGNOSIS — L120 Bullous pemphigoid: Secondary | ICD-10-CM | POA: Diagnosis not present

## 2020-01-05 DIAGNOSIS — I10 Essential (primary) hypertension: Secondary | ICD-10-CM | POA: Diagnosis not present

## 2020-01-05 DIAGNOSIS — M2041 Other hammer toe(s) (acquired), right foot: Secondary | ICD-10-CM | POA: Diagnosis not present

## 2020-01-05 DIAGNOSIS — I872 Venous insufficiency (chronic) (peripheral): Secondary | ICD-10-CM | POA: Diagnosis not present

## 2020-01-09 DIAGNOSIS — F410 Panic disorder [episodic paroxysmal anxiety] without agoraphobia: Secondary | ICD-10-CM | POA: Diagnosis not present

## 2020-01-09 DIAGNOSIS — M2041 Other hammer toe(s) (acquired), right foot: Secondary | ICD-10-CM | POA: Diagnosis not present

## 2020-01-09 DIAGNOSIS — L120 Bullous pemphigoid: Secondary | ICD-10-CM | POA: Diagnosis not present

## 2020-01-09 DIAGNOSIS — I872 Venous insufficiency (chronic) (peripheral): Secondary | ICD-10-CM | POA: Diagnosis not present

## 2020-01-09 DIAGNOSIS — L03115 Cellulitis of right lower limb: Secondary | ICD-10-CM | POA: Diagnosis not present

## 2020-01-09 DIAGNOSIS — I10 Essential (primary) hypertension: Secondary | ICD-10-CM | POA: Diagnosis not present

## 2020-01-09 DIAGNOSIS — L97811 Non-pressure chronic ulcer of other part of right lower leg limited to breakdown of skin: Secondary | ICD-10-CM | POA: Diagnosis not present

## 2020-01-09 DIAGNOSIS — E785 Hyperlipidemia, unspecified: Secondary | ICD-10-CM | POA: Diagnosis not present

## 2020-01-09 DIAGNOSIS — L97211 Non-pressure chronic ulcer of right calf limited to breakdown of skin: Secondary | ICD-10-CM | POA: Diagnosis not present

## 2020-01-10 DIAGNOSIS — L97211 Non-pressure chronic ulcer of right calf limited to breakdown of skin: Secondary | ICD-10-CM | POA: Diagnosis not present

## 2020-01-10 DIAGNOSIS — M2041 Other hammer toe(s) (acquired), right foot: Secondary | ICD-10-CM | POA: Diagnosis not present

## 2020-01-10 DIAGNOSIS — E785 Hyperlipidemia, unspecified: Secondary | ICD-10-CM | POA: Diagnosis not present

## 2020-01-10 DIAGNOSIS — I872 Venous insufficiency (chronic) (peripheral): Secondary | ICD-10-CM | POA: Diagnosis not present

## 2020-01-10 DIAGNOSIS — L03115 Cellulitis of right lower limb: Secondary | ICD-10-CM | POA: Diagnosis not present

## 2020-01-10 DIAGNOSIS — L120 Bullous pemphigoid: Secondary | ICD-10-CM | POA: Diagnosis not present

## 2020-01-10 DIAGNOSIS — L97811 Non-pressure chronic ulcer of other part of right lower leg limited to breakdown of skin: Secondary | ICD-10-CM | POA: Diagnosis not present

## 2020-01-10 DIAGNOSIS — I10 Essential (primary) hypertension: Secondary | ICD-10-CM | POA: Diagnosis not present

## 2020-01-10 DIAGNOSIS — F410 Panic disorder [episodic paroxysmal anxiety] without agoraphobia: Secondary | ICD-10-CM | POA: Diagnosis not present

## 2020-01-12 DIAGNOSIS — M2041 Other hammer toe(s) (acquired), right foot: Secondary | ICD-10-CM | POA: Diagnosis not present

## 2020-01-12 DIAGNOSIS — F410 Panic disorder [episodic paroxysmal anxiety] without agoraphobia: Secondary | ICD-10-CM | POA: Diagnosis not present

## 2020-01-12 DIAGNOSIS — L03115 Cellulitis of right lower limb: Secondary | ICD-10-CM | POA: Diagnosis not present

## 2020-01-12 DIAGNOSIS — L97811 Non-pressure chronic ulcer of other part of right lower leg limited to breakdown of skin: Secondary | ICD-10-CM | POA: Diagnosis not present

## 2020-01-12 DIAGNOSIS — I872 Venous insufficiency (chronic) (peripheral): Secondary | ICD-10-CM | POA: Diagnosis not present

## 2020-01-12 DIAGNOSIS — L120 Bullous pemphigoid: Secondary | ICD-10-CM | POA: Diagnosis not present

## 2020-01-12 DIAGNOSIS — L97211 Non-pressure chronic ulcer of right calf limited to breakdown of skin: Secondary | ICD-10-CM | POA: Diagnosis not present

## 2020-01-12 DIAGNOSIS — E785 Hyperlipidemia, unspecified: Secondary | ICD-10-CM | POA: Diagnosis not present

## 2020-01-12 DIAGNOSIS — I10 Essential (primary) hypertension: Secondary | ICD-10-CM | POA: Diagnosis not present

## 2020-01-15 DIAGNOSIS — Z85828 Personal history of other malignant neoplasm of skin: Secondary | ICD-10-CM | POA: Diagnosis not present

## 2020-01-15 DIAGNOSIS — L98499 Non-pressure chronic ulcer of skin of other sites with unspecified severity: Secondary | ICD-10-CM | POA: Diagnosis not present

## 2020-01-15 DIAGNOSIS — Z79899 Other long term (current) drug therapy: Secondary | ICD-10-CM | POA: Diagnosis not present

## 2020-01-15 DIAGNOSIS — L120 Bullous pemphigoid: Secondary | ICD-10-CM | POA: Diagnosis not present

## 2020-01-16 DIAGNOSIS — L97811 Non-pressure chronic ulcer of other part of right lower leg limited to breakdown of skin: Secondary | ICD-10-CM | POA: Diagnosis not present

## 2020-01-16 DIAGNOSIS — L03115 Cellulitis of right lower limb: Secondary | ICD-10-CM | POA: Diagnosis not present

## 2020-01-16 DIAGNOSIS — F410 Panic disorder [episodic paroxysmal anxiety] without agoraphobia: Secondary | ICD-10-CM | POA: Diagnosis not present

## 2020-01-16 DIAGNOSIS — L120 Bullous pemphigoid: Secondary | ICD-10-CM | POA: Diagnosis not present

## 2020-01-16 DIAGNOSIS — I10 Essential (primary) hypertension: Secondary | ICD-10-CM | POA: Diagnosis not present

## 2020-01-16 DIAGNOSIS — I872 Venous insufficiency (chronic) (peripheral): Secondary | ICD-10-CM | POA: Diagnosis not present

## 2020-01-16 DIAGNOSIS — M2041 Other hammer toe(s) (acquired), right foot: Secondary | ICD-10-CM | POA: Diagnosis not present

## 2020-01-16 DIAGNOSIS — E785 Hyperlipidemia, unspecified: Secondary | ICD-10-CM | POA: Diagnosis not present

## 2020-01-16 DIAGNOSIS — L97211 Non-pressure chronic ulcer of right calf limited to breakdown of skin: Secondary | ICD-10-CM | POA: Diagnosis not present

## 2020-01-19 DIAGNOSIS — I10 Essential (primary) hypertension: Secondary | ICD-10-CM | POA: Diagnosis not present

## 2020-01-19 DIAGNOSIS — L03115 Cellulitis of right lower limb: Secondary | ICD-10-CM | POA: Diagnosis not present

## 2020-01-19 DIAGNOSIS — L97211 Non-pressure chronic ulcer of right calf limited to breakdown of skin: Secondary | ICD-10-CM | POA: Diagnosis not present

## 2020-01-19 DIAGNOSIS — L120 Bullous pemphigoid: Secondary | ICD-10-CM | POA: Diagnosis not present

## 2020-01-19 DIAGNOSIS — I872 Venous insufficiency (chronic) (peripheral): Secondary | ICD-10-CM | POA: Diagnosis not present

## 2020-01-19 DIAGNOSIS — E785 Hyperlipidemia, unspecified: Secondary | ICD-10-CM | POA: Diagnosis not present

## 2020-01-19 DIAGNOSIS — F410 Panic disorder [episodic paroxysmal anxiety] without agoraphobia: Secondary | ICD-10-CM | POA: Diagnosis not present

## 2020-01-19 DIAGNOSIS — M2041 Other hammer toe(s) (acquired), right foot: Secondary | ICD-10-CM | POA: Diagnosis not present

## 2020-01-19 DIAGNOSIS — L97811 Non-pressure chronic ulcer of other part of right lower leg limited to breakdown of skin: Secondary | ICD-10-CM | POA: Diagnosis not present

## 2020-01-23 DIAGNOSIS — L03115 Cellulitis of right lower limb: Secondary | ICD-10-CM | POA: Diagnosis not present

## 2020-01-23 DIAGNOSIS — L97811 Non-pressure chronic ulcer of other part of right lower leg limited to breakdown of skin: Secondary | ICD-10-CM | POA: Diagnosis not present

## 2020-01-23 DIAGNOSIS — L120 Bullous pemphigoid: Secondary | ICD-10-CM | POA: Diagnosis not present

## 2020-01-23 DIAGNOSIS — L97211 Non-pressure chronic ulcer of right calf limited to breakdown of skin: Secondary | ICD-10-CM | POA: Diagnosis not present

## 2020-01-23 DIAGNOSIS — M2041 Other hammer toe(s) (acquired), right foot: Secondary | ICD-10-CM | POA: Diagnosis not present

## 2020-01-23 DIAGNOSIS — E785 Hyperlipidemia, unspecified: Secondary | ICD-10-CM | POA: Diagnosis not present

## 2020-01-23 DIAGNOSIS — F410 Panic disorder [episodic paroxysmal anxiety] without agoraphobia: Secondary | ICD-10-CM | POA: Diagnosis not present

## 2020-01-23 DIAGNOSIS — I872 Venous insufficiency (chronic) (peripheral): Secondary | ICD-10-CM | POA: Diagnosis not present

## 2020-01-23 DIAGNOSIS — I10 Essential (primary) hypertension: Secondary | ICD-10-CM | POA: Diagnosis not present

## 2020-01-24 ENCOUNTER — Other Ambulatory Visit: Payer: Self-pay

## 2020-01-24 DIAGNOSIS — L03039 Cellulitis of unspecified toe: Secondary | ICD-10-CM

## 2020-01-26 DIAGNOSIS — L97811 Non-pressure chronic ulcer of other part of right lower leg limited to breakdown of skin: Secondary | ICD-10-CM | POA: Diagnosis not present

## 2020-01-26 DIAGNOSIS — M2041 Other hammer toe(s) (acquired), right foot: Secondary | ICD-10-CM | POA: Diagnosis not present

## 2020-01-26 DIAGNOSIS — I872 Venous insufficiency (chronic) (peripheral): Secondary | ICD-10-CM | POA: Diagnosis not present

## 2020-01-26 DIAGNOSIS — F410 Panic disorder [episodic paroxysmal anxiety] without agoraphobia: Secondary | ICD-10-CM | POA: Diagnosis not present

## 2020-01-26 DIAGNOSIS — L120 Bullous pemphigoid: Secondary | ICD-10-CM | POA: Diagnosis not present

## 2020-01-26 DIAGNOSIS — L03115 Cellulitis of right lower limb: Secondary | ICD-10-CM | POA: Diagnosis not present

## 2020-01-26 DIAGNOSIS — L97211 Non-pressure chronic ulcer of right calf limited to breakdown of skin: Secondary | ICD-10-CM | POA: Diagnosis not present

## 2020-01-26 DIAGNOSIS — E785 Hyperlipidemia, unspecified: Secondary | ICD-10-CM | POA: Diagnosis not present

## 2020-01-26 DIAGNOSIS — I10 Essential (primary) hypertension: Secondary | ICD-10-CM | POA: Diagnosis not present

## 2020-01-29 DIAGNOSIS — M2041 Other hammer toe(s) (acquired), right foot: Secondary | ICD-10-CM | POA: Diagnosis not present

## 2020-01-29 DIAGNOSIS — I872 Venous insufficiency (chronic) (peripheral): Secondary | ICD-10-CM | POA: Diagnosis not present

## 2020-01-29 DIAGNOSIS — I10 Essential (primary) hypertension: Secondary | ICD-10-CM | POA: Diagnosis not present

## 2020-01-29 DIAGNOSIS — M48061 Spinal stenosis, lumbar region without neurogenic claudication: Secondary | ICD-10-CM | POA: Diagnosis not present

## 2020-01-29 DIAGNOSIS — Z23 Encounter for immunization: Secondary | ICD-10-CM | POA: Diagnosis not present

## 2020-01-30 DIAGNOSIS — L120 Bullous pemphigoid: Secondary | ICD-10-CM | POA: Diagnosis not present

## 2020-01-30 DIAGNOSIS — M2041 Other hammer toe(s) (acquired), right foot: Secondary | ICD-10-CM | POA: Diagnosis not present

## 2020-01-30 DIAGNOSIS — L97211 Non-pressure chronic ulcer of right calf limited to breakdown of skin: Secondary | ICD-10-CM | POA: Diagnosis not present

## 2020-01-30 DIAGNOSIS — L97811 Non-pressure chronic ulcer of other part of right lower leg limited to breakdown of skin: Secondary | ICD-10-CM | POA: Diagnosis not present

## 2020-01-30 DIAGNOSIS — L03115 Cellulitis of right lower limb: Secondary | ICD-10-CM | POA: Diagnosis not present

## 2020-01-30 DIAGNOSIS — E785 Hyperlipidemia, unspecified: Secondary | ICD-10-CM | POA: Diagnosis not present

## 2020-01-30 DIAGNOSIS — I10 Essential (primary) hypertension: Secondary | ICD-10-CM | POA: Diagnosis not present

## 2020-01-30 DIAGNOSIS — I872 Venous insufficiency (chronic) (peripheral): Secondary | ICD-10-CM | POA: Diagnosis not present

## 2020-01-30 DIAGNOSIS — F410 Panic disorder [episodic paroxysmal anxiety] without agoraphobia: Secondary | ICD-10-CM | POA: Diagnosis not present

## 2020-02-01 DIAGNOSIS — I872 Venous insufficiency (chronic) (peripheral): Secondary | ICD-10-CM | POA: Diagnosis not present

## 2020-02-01 DIAGNOSIS — L97811 Non-pressure chronic ulcer of other part of right lower leg limited to breakdown of skin: Secondary | ICD-10-CM | POA: Diagnosis not present

## 2020-02-01 DIAGNOSIS — M2041 Other hammer toe(s) (acquired), right foot: Secondary | ICD-10-CM | POA: Diagnosis not present

## 2020-02-01 DIAGNOSIS — I10 Essential (primary) hypertension: Secondary | ICD-10-CM | POA: Diagnosis not present

## 2020-02-01 DIAGNOSIS — L120 Bullous pemphigoid: Secondary | ICD-10-CM | POA: Diagnosis not present

## 2020-02-01 DIAGNOSIS — L03115 Cellulitis of right lower limb: Secondary | ICD-10-CM | POA: Diagnosis not present

## 2020-02-01 DIAGNOSIS — E785 Hyperlipidemia, unspecified: Secondary | ICD-10-CM | POA: Diagnosis not present

## 2020-02-01 DIAGNOSIS — L97211 Non-pressure chronic ulcer of right calf limited to breakdown of skin: Secondary | ICD-10-CM | POA: Diagnosis not present

## 2020-02-01 DIAGNOSIS — F410 Panic disorder [episodic paroxysmal anxiety] without agoraphobia: Secondary | ICD-10-CM | POA: Diagnosis not present

## 2020-02-05 ENCOUNTER — Encounter (HOSPITAL_COMMUNITY): Payer: Medicare PPO

## 2020-02-05 ENCOUNTER — Encounter: Payer: Medicare PPO | Admitting: Surgery

## 2020-02-06 DIAGNOSIS — L120 Bullous pemphigoid: Secondary | ICD-10-CM | POA: Diagnosis not present

## 2020-02-06 DIAGNOSIS — L97811 Non-pressure chronic ulcer of other part of right lower leg limited to breakdown of skin: Secondary | ICD-10-CM | POA: Diagnosis not present

## 2020-02-06 DIAGNOSIS — E785 Hyperlipidemia, unspecified: Secondary | ICD-10-CM | POA: Diagnosis not present

## 2020-02-06 DIAGNOSIS — I10 Essential (primary) hypertension: Secondary | ICD-10-CM | POA: Diagnosis not present

## 2020-02-06 DIAGNOSIS — L97211 Non-pressure chronic ulcer of right calf limited to breakdown of skin: Secondary | ICD-10-CM | POA: Diagnosis not present

## 2020-02-06 DIAGNOSIS — L03115 Cellulitis of right lower limb: Secondary | ICD-10-CM | POA: Diagnosis not present

## 2020-02-06 DIAGNOSIS — M2041 Other hammer toe(s) (acquired), right foot: Secondary | ICD-10-CM | POA: Diagnosis not present

## 2020-02-06 DIAGNOSIS — F410 Panic disorder [episodic paroxysmal anxiety] without agoraphobia: Secondary | ICD-10-CM | POA: Diagnosis not present

## 2020-02-06 DIAGNOSIS — I872 Venous insufficiency (chronic) (peripheral): Secondary | ICD-10-CM | POA: Diagnosis not present

## 2020-02-08 DIAGNOSIS — I10 Essential (primary) hypertension: Secondary | ICD-10-CM | POA: Diagnosis not present

## 2020-02-08 DIAGNOSIS — L97211 Non-pressure chronic ulcer of right calf limited to breakdown of skin: Secondary | ICD-10-CM | POA: Diagnosis not present

## 2020-02-08 DIAGNOSIS — L120 Bullous pemphigoid: Secondary | ICD-10-CM | POA: Diagnosis not present

## 2020-02-08 DIAGNOSIS — I872 Venous insufficiency (chronic) (peripheral): Secondary | ICD-10-CM | POA: Diagnosis not present

## 2020-02-08 DIAGNOSIS — F410 Panic disorder [episodic paroxysmal anxiety] without agoraphobia: Secondary | ICD-10-CM | POA: Diagnosis not present

## 2020-02-08 DIAGNOSIS — L03115 Cellulitis of right lower limb: Secondary | ICD-10-CM | POA: Diagnosis not present

## 2020-02-08 DIAGNOSIS — L97811 Non-pressure chronic ulcer of other part of right lower leg limited to breakdown of skin: Secondary | ICD-10-CM | POA: Diagnosis not present

## 2020-02-08 DIAGNOSIS — M2041 Other hammer toe(s) (acquired), right foot: Secondary | ICD-10-CM | POA: Diagnosis not present

## 2020-02-08 DIAGNOSIS — E785 Hyperlipidemia, unspecified: Secondary | ICD-10-CM | POA: Diagnosis not present

## 2020-02-15 DIAGNOSIS — L120 Bullous pemphigoid: Secondary | ICD-10-CM | POA: Diagnosis not present

## 2020-02-15 DIAGNOSIS — L97811 Non-pressure chronic ulcer of other part of right lower leg limited to breakdown of skin: Secondary | ICD-10-CM | POA: Diagnosis not present

## 2020-02-15 DIAGNOSIS — I10 Essential (primary) hypertension: Secondary | ICD-10-CM | POA: Diagnosis not present

## 2020-02-15 DIAGNOSIS — M2041 Other hammer toe(s) (acquired), right foot: Secondary | ICD-10-CM | POA: Diagnosis not present

## 2020-02-15 DIAGNOSIS — L03115 Cellulitis of right lower limb: Secondary | ICD-10-CM | POA: Diagnosis not present

## 2020-02-15 DIAGNOSIS — F410 Panic disorder [episodic paroxysmal anxiety] without agoraphobia: Secondary | ICD-10-CM | POA: Diagnosis not present

## 2020-02-15 DIAGNOSIS — L97211 Non-pressure chronic ulcer of right calf limited to breakdown of skin: Secondary | ICD-10-CM | POA: Diagnosis not present

## 2020-02-15 DIAGNOSIS — E785 Hyperlipidemia, unspecified: Secondary | ICD-10-CM | POA: Diagnosis not present

## 2020-02-15 DIAGNOSIS — I872 Venous insufficiency (chronic) (peripheral): Secondary | ICD-10-CM | POA: Diagnosis not present

## 2020-02-20 DIAGNOSIS — M2041 Other hammer toe(s) (acquired), right foot: Secondary | ICD-10-CM | POA: Diagnosis not present

## 2020-02-20 DIAGNOSIS — E785 Hyperlipidemia, unspecified: Secondary | ICD-10-CM | POA: Diagnosis not present

## 2020-02-20 DIAGNOSIS — F410 Panic disorder [episodic paroxysmal anxiety] without agoraphobia: Secondary | ICD-10-CM | POA: Diagnosis not present

## 2020-02-20 DIAGNOSIS — L97811 Non-pressure chronic ulcer of other part of right lower leg limited to breakdown of skin: Secondary | ICD-10-CM | POA: Diagnosis not present

## 2020-02-20 DIAGNOSIS — L97211 Non-pressure chronic ulcer of right calf limited to breakdown of skin: Secondary | ICD-10-CM | POA: Diagnosis not present

## 2020-02-20 DIAGNOSIS — I10 Essential (primary) hypertension: Secondary | ICD-10-CM | POA: Diagnosis not present

## 2020-02-20 DIAGNOSIS — L120 Bullous pemphigoid: Secondary | ICD-10-CM | POA: Diagnosis not present

## 2020-02-20 DIAGNOSIS — I872 Venous insufficiency (chronic) (peripheral): Secondary | ICD-10-CM | POA: Diagnosis not present

## 2020-02-20 DIAGNOSIS — L03115 Cellulitis of right lower limb: Secondary | ICD-10-CM | POA: Diagnosis not present

## 2020-02-21 DIAGNOSIS — M2041 Other hammer toe(s) (acquired), right foot: Secondary | ICD-10-CM | POA: Diagnosis not present

## 2020-02-21 DIAGNOSIS — I872 Venous insufficiency (chronic) (peripheral): Secondary | ICD-10-CM | POA: Diagnosis not present

## 2020-02-21 DIAGNOSIS — L97211 Non-pressure chronic ulcer of right calf limited to breakdown of skin: Secondary | ICD-10-CM | POA: Diagnosis not present

## 2020-02-21 DIAGNOSIS — E785 Hyperlipidemia, unspecified: Secondary | ICD-10-CM | POA: Diagnosis not present

## 2020-02-21 DIAGNOSIS — I10 Essential (primary) hypertension: Secondary | ICD-10-CM | POA: Diagnosis not present

## 2020-02-21 DIAGNOSIS — L120 Bullous pemphigoid: Secondary | ICD-10-CM | POA: Diagnosis not present

## 2020-02-21 DIAGNOSIS — L97811 Non-pressure chronic ulcer of other part of right lower leg limited to breakdown of skin: Secondary | ICD-10-CM | POA: Diagnosis not present

## 2020-02-21 DIAGNOSIS — F410 Panic disorder [episodic paroxysmal anxiety] without agoraphobia: Secondary | ICD-10-CM | POA: Diagnosis not present

## 2020-02-21 DIAGNOSIS — L03115 Cellulitis of right lower limb: Secondary | ICD-10-CM | POA: Diagnosis not present

## 2020-02-23 DIAGNOSIS — F410 Panic disorder [episodic paroxysmal anxiety] without agoraphobia: Secondary | ICD-10-CM | POA: Diagnosis not present

## 2020-02-23 DIAGNOSIS — I10 Essential (primary) hypertension: Secondary | ICD-10-CM | POA: Diagnosis not present

## 2020-02-23 DIAGNOSIS — M2041 Other hammer toe(s) (acquired), right foot: Secondary | ICD-10-CM | POA: Diagnosis not present

## 2020-02-23 DIAGNOSIS — I872 Venous insufficiency (chronic) (peripheral): Secondary | ICD-10-CM | POA: Diagnosis not present

## 2020-02-23 DIAGNOSIS — E785 Hyperlipidemia, unspecified: Secondary | ICD-10-CM | POA: Diagnosis not present

## 2020-02-23 DIAGNOSIS — L03115 Cellulitis of right lower limb: Secondary | ICD-10-CM | POA: Diagnosis not present

## 2020-02-23 DIAGNOSIS — L97211 Non-pressure chronic ulcer of right calf limited to breakdown of skin: Secondary | ICD-10-CM | POA: Diagnosis not present

## 2020-02-23 DIAGNOSIS — L97811 Non-pressure chronic ulcer of other part of right lower leg limited to breakdown of skin: Secondary | ICD-10-CM | POA: Diagnosis not present

## 2020-02-23 DIAGNOSIS — L120 Bullous pemphigoid: Secondary | ICD-10-CM | POA: Diagnosis not present

## 2020-02-26 DIAGNOSIS — M2041 Other hammer toe(s) (acquired), right foot: Secondary | ICD-10-CM | POA: Diagnosis not present

## 2020-02-26 DIAGNOSIS — L97211 Non-pressure chronic ulcer of right calf limited to breakdown of skin: Secondary | ICD-10-CM | POA: Diagnosis not present

## 2020-02-26 DIAGNOSIS — F410 Panic disorder [episodic paroxysmal anxiety] without agoraphobia: Secondary | ICD-10-CM | POA: Diagnosis not present

## 2020-02-26 DIAGNOSIS — I872 Venous insufficiency (chronic) (peripheral): Secondary | ICD-10-CM | POA: Diagnosis not present

## 2020-02-26 DIAGNOSIS — L03115 Cellulitis of right lower limb: Secondary | ICD-10-CM | POA: Diagnosis not present

## 2020-02-26 DIAGNOSIS — E785 Hyperlipidemia, unspecified: Secondary | ICD-10-CM | POA: Diagnosis not present

## 2020-02-26 DIAGNOSIS — L120 Bullous pemphigoid: Secondary | ICD-10-CM | POA: Diagnosis not present

## 2020-02-26 DIAGNOSIS — L97811 Non-pressure chronic ulcer of other part of right lower leg limited to breakdown of skin: Secondary | ICD-10-CM | POA: Diagnosis not present

## 2020-02-26 DIAGNOSIS — I10 Essential (primary) hypertension: Secondary | ICD-10-CM | POA: Diagnosis not present

## 2020-02-27 DIAGNOSIS — L97211 Non-pressure chronic ulcer of right calf limited to breakdown of skin: Secondary | ICD-10-CM | POA: Diagnosis not present

## 2020-02-27 DIAGNOSIS — L120 Bullous pemphigoid: Secondary | ICD-10-CM | POA: Diagnosis not present

## 2020-02-27 DIAGNOSIS — L03115 Cellulitis of right lower limb: Secondary | ICD-10-CM | POA: Diagnosis not present

## 2020-02-27 DIAGNOSIS — L97811 Non-pressure chronic ulcer of other part of right lower leg limited to breakdown of skin: Secondary | ICD-10-CM | POA: Diagnosis not present

## 2020-02-27 DIAGNOSIS — I10 Essential (primary) hypertension: Secondary | ICD-10-CM | POA: Diagnosis not present

## 2020-02-27 DIAGNOSIS — M2041 Other hammer toe(s) (acquired), right foot: Secondary | ICD-10-CM | POA: Diagnosis not present

## 2020-02-27 DIAGNOSIS — F410 Panic disorder [episodic paroxysmal anxiety] without agoraphobia: Secondary | ICD-10-CM | POA: Diagnosis not present

## 2020-02-27 DIAGNOSIS — I872 Venous insufficiency (chronic) (peripheral): Secondary | ICD-10-CM | POA: Diagnosis not present

## 2020-02-27 DIAGNOSIS — E785 Hyperlipidemia, unspecified: Secondary | ICD-10-CM | POA: Diagnosis not present

## 2020-02-29 DIAGNOSIS — L97811 Non-pressure chronic ulcer of other part of right lower leg limited to breakdown of skin: Secondary | ICD-10-CM | POA: Diagnosis not present

## 2020-02-29 DIAGNOSIS — I10 Essential (primary) hypertension: Secondary | ICD-10-CM | POA: Diagnosis not present

## 2020-02-29 DIAGNOSIS — L03115 Cellulitis of right lower limb: Secondary | ICD-10-CM | POA: Diagnosis not present

## 2020-02-29 DIAGNOSIS — I872 Venous insufficiency (chronic) (peripheral): Secondary | ICD-10-CM | POA: Diagnosis not present

## 2020-02-29 DIAGNOSIS — L120 Bullous pemphigoid: Secondary | ICD-10-CM | POA: Diagnosis not present

## 2020-02-29 DIAGNOSIS — E785 Hyperlipidemia, unspecified: Secondary | ICD-10-CM | POA: Diagnosis not present

## 2020-02-29 DIAGNOSIS — M2041 Other hammer toe(s) (acquired), right foot: Secondary | ICD-10-CM | POA: Diagnosis not present

## 2020-02-29 DIAGNOSIS — L97211 Non-pressure chronic ulcer of right calf limited to breakdown of skin: Secondary | ICD-10-CM | POA: Diagnosis not present

## 2020-02-29 DIAGNOSIS — F410 Panic disorder [episodic paroxysmal anxiety] without agoraphobia: Secondary | ICD-10-CM | POA: Diagnosis not present

## 2020-03-01 DIAGNOSIS — E785 Hyperlipidemia, unspecified: Secondary | ICD-10-CM | POA: Diagnosis not present

## 2020-03-01 DIAGNOSIS — L97811 Non-pressure chronic ulcer of other part of right lower leg limited to breakdown of skin: Secondary | ICD-10-CM | POA: Diagnosis not present

## 2020-03-01 DIAGNOSIS — L97211 Non-pressure chronic ulcer of right calf limited to breakdown of skin: Secondary | ICD-10-CM | POA: Diagnosis not present

## 2020-03-01 DIAGNOSIS — L120 Bullous pemphigoid: Secondary | ICD-10-CM | POA: Diagnosis not present

## 2020-03-01 DIAGNOSIS — M2041 Other hammer toe(s) (acquired), right foot: Secondary | ICD-10-CM | POA: Diagnosis not present

## 2020-03-01 DIAGNOSIS — L03115 Cellulitis of right lower limb: Secondary | ICD-10-CM | POA: Diagnosis not present

## 2020-03-01 DIAGNOSIS — F410 Panic disorder [episodic paroxysmal anxiety] without agoraphobia: Secondary | ICD-10-CM | POA: Diagnosis not present

## 2020-03-01 DIAGNOSIS — I872 Venous insufficiency (chronic) (peripheral): Secondary | ICD-10-CM | POA: Diagnosis not present

## 2020-03-01 DIAGNOSIS — I10 Essential (primary) hypertension: Secondary | ICD-10-CM | POA: Diagnosis not present

## 2020-03-04 DIAGNOSIS — I872 Venous insufficiency (chronic) (peripheral): Secondary | ICD-10-CM | POA: Diagnosis not present

## 2020-03-04 DIAGNOSIS — I10 Essential (primary) hypertension: Secondary | ICD-10-CM | POA: Diagnosis not present

## 2020-03-04 DIAGNOSIS — E785 Hyperlipidemia, unspecified: Secondary | ICD-10-CM | POA: Diagnosis not present

## 2020-03-04 DIAGNOSIS — F410 Panic disorder [episodic paroxysmal anxiety] without agoraphobia: Secondary | ICD-10-CM | POA: Diagnosis not present

## 2020-03-04 DIAGNOSIS — L97811 Non-pressure chronic ulcer of other part of right lower leg limited to breakdown of skin: Secondary | ICD-10-CM | POA: Diagnosis not present

## 2020-03-04 DIAGNOSIS — L03115 Cellulitis of right lower limb: Secondary | ICD-10-CM | POA: Diagnosis not present

## 2020-03-04 DIAGNOSIS — L120 Bullous pemphigoid: Secondary | ICD-10-CM | POA: Diagnosis not present

## 2020-03-04 DIAGNOSIS — L97211 Non-pressure chronic ulcer of right calf limited to breakdown of skin: Secondary | ICD-10-CM | POA: Diagnosis not present

## 2020-03-04 DIAGNOSIS — R21 Rash and other nonspecific skin eruption: Secondary | ICD-10-CM | POA: Diagnosis not present

## 2020-03-04 DIAGNOSIS — M2041 Other hammer toe(s) (acquired), right foot: Secondary | ICD-10-CM | POA: Diagnosis not present

## 2020-03-05 DIAGNOSIS — I872 Venous insufficiency (chronic) (peripheral): Secondary | ICD-10-CM | POA: Diagnosis not present

## 2020-03-05 DIAGNOSIS — I10 Essential (primary) hypertension: Secondary | ICD-10-CM | POA: Diagnosis not present

## 2020-03-05 DIAGNOSIS — E785 Hyperlipidemia, unspecified: Secondary | ICD-10-CM | POA: Diagnosis not present

## 2020-03-05 DIAGNOSIS — L120 Bullous pemphigoid: Secondary | ICD-10-CM | POA: Diagnosis not present

## 2020-03-05 DIAGNOSIS — F410 Panic disorder [episodic paroxysmal anxiety] without agoraphobia: Secondary | ICD-10-CM | POA: Diagnosis not present

## 2020-03-05 DIAGNOSIS — M2041 Other hammer toe(s) (acquired), right foot: Secondary | ICD-10-CM | POA: Diagnosis not present

## 2020-03-05 DIAGNOSIS — L97211 Non-pressure chronic ulcer of right calf limited to breakdown of skin: Secondary | ICD-10-CM | POA: Diagnosis not present

## 2020-03-05 DIAGNOSIS — L97811 Non-pressure chronic ulcer of other part of right lower leg limited to breakdown of skin: Secondary | ICD-10-CM | POA: Diagnosis not present

## 2020-03-05 DIAGNOSIS — L03115 Cellulitis of right lower limb: Secondary | ICD-10-CM | POA: Diagnosis not present

## 2020-03-06 DIAGNOSIS — Z85828 Personal history of other malignant neoplasm of skin: Secondary | ICD-10-CM | POA: Diagnosis not present

## 2020-03-06 DIAGNOSIS — L309 Dermatitis, unspecified: Secondary | ICD-10-CM | POA: Diagnosis not present

## 2020-03-06 DIAGNOSIS — I8311 Varicose veins of right lower extremity with inflammation: Secondary | ICD-10-CM | POA: Diagnosis not present

## 2020-03-06 DIAGNOSIS — Z79899 Other long term (current) drug therapy: Secondary | ICD-10-CM | POA: Diagnosis not present

## 2020-03-06 DIAGNOSIS — L98499 Non-pressure chronic ulcer of skin of other sites with unspecified severity: Secondary | ICD-10-CM | POA: Diagnosis not present

## 2020-03-06 DIAGNOSIS — L120 Bullous pemphigoid: Secondary | ICD-10-CM | POA: Diagnosis not present

## 2020-03-06 DIAGNOSIS — I872 Venous insufficiency (chronic) (peripheral): Secondary | ICD-10-CM | POA: Diagnosis not present

## 2020-03-06 DIAGNOSIS — I8312 Varicose veins of left lower extremity with inflammation: Secondary | ICD-10-CM | POA: Diagnosis not present

## 2020-03-07 DIAGNOSIS — L97211 Non-pressure chronic ulcer of right calf limited to breakdown of skin: Secondary | ICD-10-CM | POA: Diagnosis not present

## 2020-03-07 DIAGNOSIS — E785 Hyperlipidemia, unspecified: Secondary | ICD-10-CM | POA: Diagnosis not present

## 2020-03-07 DIAGNOSIS — M2041 Other hammer toe(s) (acquired), right foot: Secondary | ICD-10-CM | POA: Diagnosis not present

## 2020-03-07 DIAGNOSIS — L97811 Non-pressure chronic ulcer of other part of right lower leg limited to breakdown of skin: Secondary | ICD-10-CM | POA: Diagnosis not present

## 2020-03-07 DIAGNOSIS — I10 Essential (primary) hypertension: Secondary | ICD-10-CM | POA: Diagnosis not present

## 2020-03-07 DIAGNOSIS — L03115 Cellulitis of right lower limb: Secondary | ICD-10-CM | POA: Diagnosis not present

## 2020-03-07 DIAGNOSIS — I872 Venous insufficiency (chronic) (peripheral): Secondary | ICD-10-CM | POA: Diagnosis not present

## 2020-03-07 DIAGNOSIS — L120 Bullous pemphigoid: Secondary | ICD-10-CM | POA: Diagnosis not present

## 2020-03-07 DIAGNOSIS — F410 Panic disorder [episodic paroxysmal anxiety] without agoraphobia: Secondary | ICD-10-CM | POA: Diagnosis not present

## 2020-03-08 DIAGNOSIS — I872 Venous insufficiency (chronic) (peripheral): Secondary | ICD-10-CM | POA: Diagnosis not present

## 2020-03-08 DIAGNOSIS — L03115 Cellulitis of right lower limb: Secondary | ICD-10-CM | POA: Diagnosis not present

## 2020-03-08 DIAGNOSIS — L120 Bullous pemphigoid: Secondary | ICD-10-CM | POA: Diagnosis not present

## 2020-03-08 DIAGNOSIS — E785 Hyperlipidemia, unspecified: Secondary | ICD-10-CM | POA: Diagnosis not present

## 2020-03-08 DIAGNOSIS — M2041 Other hammer toe(s) (acquired), right foot: Secondary | ICD-10-CM | POA: Diagnosis not present

## 2020-03-08 DIAGNOSIS — I10 Essential (primary) hypertension: Secondary | ICD-10-CM | POA: Diagnosis not present

## 2020-03-08 DIAGNOSIS — L97811 Non-pressure chronic ulcer of other part of right lower leg limited to breakdown of skin: Secondary | ICD-10-CM | POA: Diagnosis not present

## 2020-03-08 DIAGNOSIS — F410 Panic disorder [episodic paroxysmal anxiety] without agoraphobia: Secondary | ICD-10-CM | POA: Diagnosis not present

## 2020-03-08 DIAGNOSIS — L97211 Non-pressure chronic ulcer of right calf limited to breakdown of skin: Secondary | ICD-10-CM | POA: Diagnosis not present

## 2020-03-09 DIAGNOSIS — L120 Bullous pemphigoid: Secondary | ICD-10-CM | POA: Diagnosis not present

## 2020-03-09 DIAGNOSIS — F410 Panic disorder [episodic paroxysmal anxiety] without agoraphobia: Secondary | ICD-10-CM | POA: Diagnosis not present

## 2020-03-09 DIAGNOSIS — E785 Hyperlipidemia, unspecified: Secondary | ICD-10-CM | POA: Diagnosis not present

## 2020-03-09 DIAGNOSIS — L97811 Non-pressure chronic ulcer of other part of right lower leg limited to breakdown of skin: Secondary | ICD-10-CM | POA: Diagnosis not present

## 2020-03-09 DIAGNOSIS — M2041 Other hammer toe(s) (acquired), right foot: Secondary | ICD-10-CM | POA: Diagnosis not present

## 2020-03-09 DIAGNOSIS — I10 Essential (primary) hypertension: Secondary | ICD-10-CM | POA: Diagnosis not present

## 2020-03-09 DIAGNOSIS — L97211 Non-pressure chronic ulcer of right calf limited to breakdown of skin: Secondary | ICD-10-CM | POA: Diagnosis not present

## 2020-03-09 DIAGNOSIS — I872 Venous insufficiency (chronic) (peripheral): Secondary | ICD-10-CM | POA: Diagnosis not present

## 2020-03-09 DIAGNOSIS — L03115 Cellulitis of right lower limb: Secondary | ICD-10-CM | POA: Diagnosis not present

## 2020-03-12 DIAGNOSIS — M2041 Other hammer toe(s) (acquired), right foot: Secondary | ICD-10-CM | POA: Diagnosis not present

## 2020-03-12 DIAGNOSIS — F410 Panic disorder [episodic paroxysmal anxiety] without agoraphobia: Secondary | ICD-10-CM | POA: Diagnosis not present

## 2020-03-12 DIAGNOSIS — L120 Bullous pemphigoid: Secondary | ICD-10-CM | POA: Diagnosis not present

## 2020-03-12 DIAGNOSIS — E785 Hyperlipidemia, unspecified: Secondary | ICD-10-CM | POA: Diagnosis not present

## 2020-03-12 DIAGNOSIS — I872 Venous insufficiency (chronic) (peripheral): Secondary | ICD-10-CM | POA: Diagnosis not present

## 2020-03-12 DIAGNOSIS — I10 Essential (primary) hypertension: Secondary | ICD-10-CM | POA: Diagnosis not present

## 2020-03-12 DIAGNOSIS — L97811 Non-pressure chronic ulcer of other part of right lower leg limited to breakdown of skin: Secondary | ICD-10-CM | POA: Diagnosis not present

## 2020-03-12 DIAGNOSIS — L03115 Cellulitis of right lower limb: Secondary | ICD-10-CM | POA: Diagnosis not present

## 2020-03-12 DIAGNOSIS — L97211 Non-pressure chronic ulcer of right calf limited to breakdown of skin: Secondary | ICD-10-CM | POA: Diagnosis not present

## 2020-03-15 DIAGNOSIS — I872 Venous insufficiency (chronic) (peripheral): Secondary | ICD-10-CM | POA: Diagnosis not present

## 2020-03-15 DIAGNOSIS — L97211 Non-pressure chronic ulcer of right calf limited to breakdown of skin: Secondary | ICD-10-CM | POA: Diagnosis not present

## 2020-03-15 DIAGNOSIS — E785 Hyperlipidemia, unspecified: Secondary | ICD-10-CM | POA: Diagnosis not present

## 2020-03-15 DIAGNOSIS — F410 Panic disorder [episodic paroxysmal anxiety] without agoraphobia: Secondary | ICD-10-CM | POA: Diagnosis not present

## 2020-03-15 DIAGNOSIS — I10 Essential (primary) hypertension: Secondary | ICD-10-CM | POA: Diagnosis not present

## 2020-03-15 DIAGNOSIS — L97811 Non-pressure chronic ulcer of other part of right lower leg limited to breakdown of skin: Secondary | ICD-10-CM | POA: Diagnosis not present

## 2020-03-15 DIAGNOSIS — L120 Bullous pemphigoid: Secondary | ICD-10-CM | POA: Diagnosis not present

## 2020-03-15 DIAGNOSIS — M2041 Other hammer toe(s) (acquired), right foot: Secondary | ICD-10-CM | POA: Diagnosis not present

## 2020-03-15 DIAGNOSIS — L03115 Cellulitis of right lower limb: Secondary | ICD-10-CM | POA: Diagnosis not present

## 2020-03-18 DIAGNOSIS — E785 Hyperlipidemia, unspecified: Secondary | ICD-10-CM | POA: Diagnosis not present

## 2020-03-18 DIAGNOSIS — I872 Venous insufficiency (chronic) (peripheral): Secondary | ICD-10-CM | POA: Diagnosis not present

## 2020-03-18 DIAGNOSIS — F410 Panic disorder [episodic paroxysmal anxiety] without agoraphobia: Secondary | ICD-10-CM | POA: Diagnosis not present

## 2020-03-18 DIAGNOSIS — L97811 Non-pressure chronic ulcer of other part of right lower leg limited to breakdown of skin: Secondary | ICD-10-CM | POA: Diagnosis not present

## 2020-03-18 DIAGNOSIS — L120 Bullous pemphigoid: Secondary | ICD-10-CM | POA: Diagnosis not present

## 2020-03-18 DIAGNOSIS — M2041 Other hammer toe(s) (acquired), right foot: Secondary | ICD-10-CM | POA: Diagnosis not present

## 2020-03-18 DIAGNOSIS — L97211 Non-pressure chronic ulcer of right calf limited to breakdown of skin: Secondary | ICD-10-CM | POA: Diagnosis not present

## 2020-03-18 DIAGNOSIS — I10 Essential (primary) hypertension: Secondary | ICD-10-CM | POA: Diagnosis not present

## 2020-03-18 DIAGNOSIS — L03115 Cellulitis of right lower limb: Secondary | ICD-10-CM | POA: Diagnosis not present

## 2020-03-21 ENCOUNTER — Ambulatory Visit: Payer: Medicare Other | Admitting: Podiatry

## 2020-03-21 DIAGNOSIS — M2041 Other hammer toe(s) (acquired), right foot: Secondary | ICD-10-CM | POA: Diagnosis not present

## 2020-03-21 DIAGNOSIS — L97211 Non-pressure chronic ulcer of right calf limited to breakdown of skin: Secondary | ICD-10-CM | POA: Diagnosis not present

## 2020-03-21 DIAGNOSIS — F410 Panic disorder [episodic paroxysmal anxiety] without agoraphobia: Secondary | ICD-10-CM | POA: Diagnosis not present

## 2020-03-21 DIAGNOSIS — I10 Essential (primary) hypertension: Secondary | ICD-10-CM | POA: Diagnosis not present

## 2020-03-21 DIAGNOSIS — L120 Bullous pemphigoid: Secondary | ICD-10-CM | POA: Diagnosis not present

## 2020-03-21 DIAGNOSIS — I872 Venous insufficiency (chronic) (peripheral): Secondary | ICD-10-CM | POA: Diagnosis not present

## 2020-03-21 DIAGNOSIS — E785 Hyperlipidemia, unspecified: Secondary | ICD-10-CM | POA: Diagnosis not present

## 2020-03-21 DIAGNOSIS — L97811 Non-pressure chronic ulcer of other part of right lower leg limited to breakdown of skin: Secondary | ICD-10-CM | POA: Diagnosis not present

## 2020-03-21 DIAGNOSIS — L03115 Cellulitis of right lower limb: Secondary | ICD-10-CM | POA: Diagnosis not present

## 2020-03-22 DIAGNOSIS — L97211 Non-pressure chronic ulcer of right calf limited to breakdown of skin: Secondary | ICD-10-CM | POA: Diagnosis not present

## 2020-03-22 DIAGNOSIS — F410 Panic disorder [episodic paroxysmal anxiety] without agoraphobia: Secondary | ICD-10-CM | POA: Diagnosis not present

## 2020-03-22 DIAGNOSIS — L03115 Cellulitis of right lower limb: Secondary | ICD-10-CM | POA: Diagnosis not present

## 2020-03-22 DIAGNOSIS — I872 Venous insufficiency (chronic) (peripheral): Secondary | ICD-10-CM | POA: Diagnosis not present

## 2020-03-22 DIAGNOSIS — I10 Essential (primary) hypertension: Secondary | ICD-10-CM | POA: Diagnosis not present

## 2020-03-22 DIAGNOSIS — L97811 Non-pressure chronic ulcer of other part of right lower leg limited to breakdown of skin: Secondary | ICD-10-CM | POA: Diagnosis not present

## 2020-03-22 DIAGNOSIS — E785 Hyperlipidemia, unspecified: Secondary | ICD-10-CM | POA: Diagnosis not present

## 2020-03-22 DIAGNOSIS — L120 Bullous pemphigoid: Secondary | ICD-10-CM | POA: Diagnosis not present

## 2020-03-22 DIAGNOSIS — M2041 Other hammer toe(s) (acquired), right foot: Secondary | ICD-10-CM | POA: Diagnosis not present

## 2020-03-26 DIAGNOSIS — I872 Venous insufficiency (chronic) (peripheral): Secondary | ICD-10-CM | POA: Diagnosis not present

## 2020-03-26 DIAGNOSIS — F410 Panic disorder [episodic paroxysmal anxiety] without agoraphobia: Secondary | ICD-10-CM | POA: Diagnosis not present

## 2020-03-26 DIAGNOSIS — L97811 Non-pressure chronic ulcer of other part of right lower leg limited to breakdown of skin: Secondary | ICD-10-CM | POA: Diagnosis not present

## 2020-03-26 DIAGNOSIS — L97211 Non-pressure chronic ulcer of right calf limited to breakdown of skin: Secondary | ICD-10-CM | POA: Diagnosis not present

## 2020-03-26 DIAGNOSIS — L120 Bullous pemphigoid: Secondary | ICD-10-CM | POA: Diagnosis not present

## 2020-03-26 DIAGNOSIS — M2041 Other hammer toe(s) (acquired), right foot: Secondary | ICD-10-CM | POA: Diagnosis not present

## 2020-03-26 DIAGNOSIS — L03115 Cellulitis of right lower limb: Secondary | ICD-10-CM | POA: Diagnosis not present

## 2020-03-26 DIAGNOSIS — E785 Hyperlipidemia, unspecified: Secondary | ICD-10-CM | POA: Diagnosis not present

## 2020-03-26 DIAGNOSIS — I10 Essential (primary) hypertension: Secondary | ICD-10-CM | POA: Diagnosis not present

## 2020-03-27 DIAGNOSIS — E785 Hyperlipidemia, unspecified: Secondary | ICD-10-CM | POA: Diagnosis not present

## 2020-03-27 DIAGNOSIS — M2041 Other hammer toe(s) (acquired), right foot: Secondary | ICD-10-CM | POA: Diagnosis not present

## 2020-03-27 DIAGNOSIS — L97811 Non-pressure chronic ulcer of other part of right lower leg limited to breakdown of skin: Secondary | ICD-10-CM | POA: Diagnosis not present

## 2020-03-27 DIAGNOSIS — L03115 Cellulitis of right lower limb: Secondary | ICD-10-CM | POA: Diagnosis not present

## 2020-03-27 DIAGNOSIS — L120 Bullous pemphigoid: Secondary | ICD-10-CM | POA: Diagnosis not present

## 2020-03-27 DIAGNOSIS — L97211 Non-pressure chronic ulcer of right calf limited to breakdown of skin: Secondary | ICD-10-CM | POA: Diagnosis not present

## 2020-03-27 DIAGNOSIS — I872 Venous insufficiency (chronic) (peripheral): Secondary | ICD-10-CM | POA: Diagnosis not present

## 2020-03-27 DIAGNOSIS — I10 Essential (primary) hypertension: Secondary | ICD-10-CM | POA: Diagnosis not present

## 2020-03-27 DIAGNOSIS — F410 Panic disorder [episodic paroxysmal anxiety] without agoraphobia: Secondary | ICD-10-CM | POA: Diagnosis not present

## 2020-03-29 DIAGNOSIS — M2041 Other hammer toe(s) (acquired), right foot: Secondary | ICD-10-CM | POA: Diagnosis not present

## 2020-03-29 DIAGNOSIS — L03115 Cellulitis of right lower limb: Secondary | ICD-10-CM | POA: Diagnosis not present

## 2020-03-29 DIAGNOSIS — F410 Panic disorder [episodic paroxysmal anxiety] without agoraphobia: Secondary | ICD-10-CM | POA: Diagnosis not present

## 2020-03-29 DIAGNOSIS — L97211 Non-pressure chronic ulcer of right calf limited to breakdown of skin: Secondary | ICD-10-CM | POA: Diagnosis not present

## 2020-03-29 DIAGNOSIS — E785 Hyperlipidemia, unspecified: Secondary | ICD-10-CM | POA: Diagnosis not present

## 2020-03-29 DIAGNOSIS — I872 Venous insufficiency (chronic) (peripheral): Secondary | ICD-10-CM | POA: Diagnosis not present

## 2020-03-29 DIAGNOSIS — L97811 Non-pressure chronic ulcer of other part of right lower leg limited to breakdown of skin: Secondary | ICD-10-CM | POA: Diagnosis not present

## 2020-03-29 DIAGNOSIS — I10 Essential (primary) hypertension: Secondary | ICD-10-CM | POA: Diagnosis not present

## 2020-03-29 DIAGNOSIS — L120 Bullous pemphigoid: Secondary | ICD-10-CM | POA: Diagnosis not present

## 2020-04-02 DIAGNOSIS — E785 Hyperlipidemia, unspecified: Secondary | ICD-10-CM | POA: Diagnosis not present

## 2020-04-02 DIAGNOSIS — F410 Panic disorder [episodic paroxysmal anxiety] without agoraphobia: Secondary | ICD-10-CM | POA: Diagnosis not present

## 2020-04-02 DIAGNOSIS — L97811 Non-pressure chronic ulcer of other part of right lower leg limited to breakdown of skin: Secondary | ICD-10-CM | POA: Diagnosis not present

## 2020-04-02 DIAGNOSIS — I10 Essential (primary) hypertension: Secondary | ICD-10-CM | POA: Diagnosis not present

## 2020-04-02 DIAGNOSIS — L120 Bullous pemphigoid: Secondary | ICD-10-CM | POA: Diagnosis not present

## 2020-04-02 DIAGNOSIS — L03115 Cellulitis of right lower limb: Secondary | ICD-10-CM | POA: Diagnosis not present

## 2020-04-02 DIAGNOSIS — I872 Venous insufficiency (chronic) (peripheral): Secondary | ICD-10-CM | POA: Diagnosis not present

## 2020-04-02 DIAGNOSIS — M2041 Other hammer toe(s) (acquired), right foot: Secondary | ICD-10-CM | POA: Diagnosis not present

## 2020-04-02 DIAGNOSIS — L97211 Non-pressure chronic ulcer of right calf limited to breakdown of skin: Secondary | ICD-10-CM | POA: Diagnosis not present

## 2020-04-05 DIAGNOSIS — E785 Hyperlipidemia, unspecified: Secondary | ICD-10-CM | POA: Diagnosis not present

## 2020-04-05 DIAGNOSIS — L97811 Non-pressure chronic ulcer of other part of right lower leg limited to breakdown of skin: Secondary | ICD-10-CM | POA: Diagnosis not present

## 2020-04-05 DIAGNOSIS — L97211 Non-pressure chronic ulcer of right calf limited to breakdown of skin: Secondary | ICD-10-CM | POA: Diagnosis not present

## 2020-04-05 DIAGNOSIS — F410 Panic disorder [episodic paroxysmal anxiety] without agoraphobia: Secondary | ICD-10-CM | POA: Diagnosis not present

## 2020-04-05 DIAGNOSIS — I872 Venous insufficiency (chronic) (peripheral): Secondary | ICD-10-CM | POA: Diagnosis not present

## 2020-04-05 DIAGNOSIS — L03115 Cellulitis of right lower limb: Secondary | ICD-10-CM | POA: Diagnosis not present

## 2020-04-05 DIAGNOSIS — L120 Bullous pemphigoid: Secondary | ICD-10-CM | POA: Diagnosis not present

## 2020-04-05 DIAGNOSIS — I10 Essential (primary) hypertension: Secondary | ICD-10-CM | POA: Diagnosis not present

## 2020-04-05 DIAGNOSIS — M2041 Other hammer toe(s) (acquired), right foot: Secondary | ICD-10-CM | POA: Diagnosis not present

## 2020-04-08 DIAGNOSIS — M48061 Spinal stenosis, lumbar region without neurogenic claudication: Secondary | ICD-10-CM | POA: Diagnosis not present

## 2020-04-08 DIAGNOSIS — E785 Hyperlipidemia, unspecified: Secondary | ICD-10-CM | POA: Diagnosis not present

## 2020-04-08 DIAGNOSIS — I10 Essential (primary) hypertension: Secondary | ICD-10-CM | POA: Diagnosis not present

## 2020-04-08 DIAGNOSIS — F410 Panic disorder [episodic paroxysmal anxiety] without agoraphobia: Secondary | ICD-10-CM | POA: Diagnosis not present

## 2020-04-08 DIAGNOSIS — I872 Venous insufficiency (chronic) (peripheral): Secondary | ICD-10-CM | POA: Diagnosis not present

## 2020-04-08 DIAGNOSIS — L120 Bullous pemphigoid: Secondary | ICD-10-CM | POA: Diagnosis not present

## 2020-04-08 DIAGNOSIS — M47816 Spondylosis without myelopathy or radiculopathy, lumbar region: Secondary | ICD-10-CM | POA: Diagnosis not present

## 2020-04-08 DIAGNOSIS — M2041 Other hammer toe(s) (acquired), right foot: Secondary | ICD-10-CM | POA: Diagnosis not present

## 2020-04-08 DIAGNOSIS — L97512 Non-pressure chronic ulcer of other part of right foot with fat layer exposed: Secondary | ICD-10-CM | POA: Diagnosis not present

## 2020-04-09 DIAGNOSIS — F410 Panic disorder [episodic paroxysmal anxiety] without agoraphobia: Secondary | ICD-10-CM | POA: Diagnosis not present

## 2020-04-09 DIAGNOSIS — L120 Bullous pemphigoid: Secondary | ICD-10-CM | POA: Diagnosis not present

## 2020-04-09 DIAGNOSIS — M47816 Spondylosis without myelopathy or radiculopathy, lumbar region: Secondary | ICD-10-CM | POA: Diagnosis not present

## 2020-04-09 DIAGNOSIS — L97512 Non-pressure chronic ulcer of other part of right foot with fat layer exposed: Secondary | ICD-10-CM | POA: Diagnosis not present

## 2020-04-09 DIAGNOSIS — I872 Venous insufficiency (chronic) (peripheral): Secondary | ICD-10-CM | POA: Diagnosis not present

## 2020-04-09 DIAGNOSIS — I10 Essential (primary) hypertension: Secondary | ICD-10-CM | POA: Diagnosis not present

## 2020-04-09 DIAGNOSIS — M48061 Spinal stenosis, lumbar region without neurogenic claudication: Secondary | ICD-10-CM | POA: Diagnosis not present

## 2020-04-09 DIAGNOSIS — E785 Hyperlipidemia, unspecified: Secondary | ICD-10-CM | POA: Diagnosis not present

## 2020-04-09 DIAGNOSIS — M2041 Other hammer toe(s) (acquired), right foot: Secondary | ICD-10-CM | POA: Diagnosis not present

## 2020-04-16 DIAGNOSIS — F410 Panic disorder [episodic paroxysmal anxiety] without agoraphobia: Secondary | ICD-10-CM | POA: Diagnosis not present

## 2020-04-16 DIAGNOSIS — M48061 Spinal stenosis, lumbar region without neurogenic claudication: Secondary | ICD-10-CM | POA: Diagnosis not present

## 2020-04-16 DIAGNOSIS — E785 Hyperlipidemia, unspecified: Secondary | ICD-10-CM | POA: Diagnosis not present

## 2020-04-16 DIAGNOSIS — I10 Essential (primary) hypertension: Secondary | ICD-10-CM | POA: Diagnosis not present

## 2020-04-16 DIAGNOSIS — L120 Bullous pemphigoid: Secondary | ICD-10-CM | POA: Diagnosis not present

## 2020-04-16 DIAGNOSIS — M47816 Spondylosis without myelopathy or radiculopathy, lumbar region: Secondary | ICD-10-CM | POA: Diagnosis not present

## 2020-04-16 DIAGNOSIS — L97512 Non-pressure chronic ulcer of other part of right foot with fat layer exposed: Secondary | ICD-10-CM | POA: Diagnosis not present

## 2020-04-16 DIAGNOSIS — M2041 Other hammer toe(s) (acquired), right foot: Secondary | ICD-10-CM | POA: Diagnosis not present

## 2020-04-16 DIAGNOSIS — I872 Venous insufficiency (chronic) (peripheral): Secondary | ICD-10-CM | POA: Diagnosis not present

## 2020-04-23 DIAGNOSIS — I872 Venous insufficiency (chronic) (peripheral): Secondary | ICD-10-CM | POA: Diagnosis not present

## 2020-04-23 DIAGNOSIS — M2041 Other hammer toe(s) (acquired), right foot: Secondary | ICD-10-CM | POA: Diagnosis not present

## 2020-04-23 DIAGNOSIS — E785 Hyperlipidemia, unspecified: Secondary | ICD-10-CM | POA: Diagnosis not present

## 2020-04-23 DIAGNOSIS — M48061 Spinal stenosis, lumbar region without neurogenic claudication: Secondary | ICD-10-CM | POA: Diagnosis not present

## 2020-04-23 DIAGNOSIS — F410 Panic disorder [episodic paroxysmal anxiety] without agoraphobia: Secondary | ICD-10-CM | POA: Diagnosis not present

## 2020-04-23 DIAGNOSIS — L97512 Non-pressure chronic ulcer of other part of right foot with fat layer exposed: Secondary | ICD-10-CM | POA: Diagnosis not present

## 2020-04-23 DIAGNOSIS — M47816 Spondylosis without myelopathy or radiculopathy, lumbar region: Secondary | ICD-10-CM | POA: Diagnosis not present

## 2020-04-23 DIAGNOSIS — L120 Bullous pemphigoid: Secondary | ICD-10-CM | POA: Diagnosis not present

## 2020-04-23 DIAGNOSIS — I10 Essential (primary) hypertension: Secondary | ICD-10-CM | POA: Diagnosis not present

## 2020-04-29 DIAGNOSIS — I10 Essential (primary) hypertension: Secondary | ICD-10-CM | POA: Diagnosis not present

## 2020-05-07 DIAGNOSIS — L120 Bullous pemphigoid: Secondary | ICD-10-CM | POA: Diagnosis not present

## 2020-05-07 DIAGNOSIS — I10 Essential (primary) hypertension: Secondary | ICD-10-CM | POA: Diagnosis not present

## 2020-05-07 DIAGNOSIS — M47816 Spondylosis without myelopathy or radiculopathy, lumbar region: Secondary | ICD-10-CM | POA: Diagnosis not present

## 2020-05-07 DIAGNOSIS — M48061 Spinal stenosis, lumbar region without neurogenic claudication: Secondary | ICD-10-CM | POA: Diagnosis not present

## 2020-05-07 DIAGNOSIS — E785 Hyperlipidemia, unspecified: Secondary | ICD-10-CM | POA: Diagnosis not present

## 2020-05-07 DIAGNOSIS — I872 Venous insufficiency (chronic) (peripheral): Secondary | ICD-10-CM | POA: Diagnosis not present

## 2020-05-07 DIAGNOSIS — M2041 Other hammer toe(s) (acquired), right foot: Secondary | ICD-10-CM | POA: Diagnosis not present

## 2020-05-07 DIAGNOSIS — L97512 Non-pressure chronic ulcer of other part of right foot with fat layer exposed: Secondary | ICD-10-CM | POA: Diagnosis not present

## 2020-05-07 DIAGNOSIS — F410 Panic disorder [episodic paroxysmal anxiety] without agoraphobia: Secondary | ICD-10-CM | POA: Diagnosis not present

## 2020-05-08 DIAGNOSIS — M47816 Spondylosis without myelopathy or radiculopathy, lumbar region: Secondary | ICD-10-CM | POA: Diagnosis not present

## 2020-05-08 DIAGNOSIS — L120 Bullous pemphigoid: Secondary | ICD-10-CM | POA: Diagnosis not present

## 2020-05-08 DIAGNOSIS — I10 Essential (primary) hypertension: Secondary | ICD-10-CM | POA: Diagnosis not present

## 2020-05-08 DIAGNOSIS — F410 Panic disorder [episodic paroxysmal anxiety] without agoraphobia: Secondary | ICD-10-CM | POA: Diagnosis not present

## 2020-05-08 DIAGNOSIS — M2041 Other hammer toe(s) (acquired), right foot: Secondary | ICD-10-CM | POA: Diagnosis not present

## 2020-05-08 DIAGNOSIS — I872 Venous insufficiency (chronic) (peripheral): Secondary | ICD-10-CM | POA: Diagnosis not present

## 2020-05-08 DIAGNOSIS — E785 Hyperlipidemia, unspecified: Secondary | ICD-10-CM | POA: Diagnosis not present

## 2020-05-08 DIAGNOSIS — L97512 Non-pressure chronic ulcer of other part of right foot with fat layer exposed: Secondary | ICD-10-CM | POA: Diagnosis not present

## 2020-05-08 DIAGNOSIS — M48061 Spinal stenosis, lumbar region without neurogenic claudication: Secondary | ICD-10-CM | POA: Diagnosis not present

## 2020-05-20 DIAGNOSIS — I872 Venous insufficiency (chronic) (peripheral): Secondary | ICD-10-CM | POA: Diagnosis not present

## 2020-05-20 DIAGNOSIS — M48061 Spinal stenosis, lumbar region without neurogenic claudication: Secondary | ICD-10-CM | POA: Diagnosis not present

## 2020-05-20 DIAGNOSIS — M2041 Other hammer toe(s) (acquired), right foot: Secondary | ICD-10-CM | POA: Diagnosis not present

## 2020-05-20 DIAGNOSIS — M47816 Spondylosis without myelopathy or radiculopathy, lumbar region: Secondary | ICD-10-CM | POA: Diagnosis not present

## 2020-05-20 DIAGNOSIS — F410 Panic disorder [episodic paroxysmal anxiety] without agoraphobia: Secondary | ICD-10-CM | POA: Diagnosis not present

## 2020-05-20 DIAGNOSIS — E785 Hyperlipidemia, unspecified: Secondary | ICD-10-CM | POA: Diagnosis not present

## 2020-05-20 DIAGNOSIS — L120 Bullous pemphigoid: Secondary | ICD-10-CM | POA: Diagnosis not present

## 2020-05-20 DIAGNOSIS — I10 Essential (primary) hypertension: Secondary | ICD-10-CM | POA: Diagnosis not present

## 2020-05-20 DIAGNOSIS — L97512 Non-pressure chronic ulcer of other part of right foot with fat layer exposed: Secondary | ICD-10-CM | POA: Diagnosis not present

## 2020-06-04 DIAGNOSIS — I10 Essential (primary) hypertension: Secondary | ICD-10-CM | POA: Diagnosis not present

## 2020-06-04 DIAGNOSIS — L97512 Non-pressure chronic ulcer of other part of right foot with fat layer exposed: Secondary | ICD-10-CM | POA: Diagnosis not present

## 2020-06-04 DIAGNOSIS — M47816 Spondylosis without myelopathy or radiculopathy, lumbar region: Secondary | ICD-10-CM | POA: Diagnosis not present

## 2020-06-04 DIAGNOSIS — I872 Venous insufficiency (chronic) (peripheral): Secondary | ICD-10-CM | POA: Diagnosis not present

## 2020-06-04 DIAGNOSIS — L120 Bullous pemphigoid: Secondary | ICD-10-CM | POA: Diagnosis not present

## 2020-06-04 DIAGNOSIS — E785 Hyperlipidemia, unspecified: Secondary | ICD-10-CM | POA: Diagnosis not present

## 2020-06-04 DIAGNOSIS — M2041 Other hammer toe(s) (acquired), right foot: Secondary | ICD-10-CM | POA: Diagnosis not present

## 2020-06-04 DIAGNOSIS — M48061 Spinal stenosis, lumbar region without neurogenic claudication: Secondary | ICD-10-CM | POA: Diagnosis not present

## 2020-06-04 DIAGNOSIS — F410 Panic disorder [episodic paroxysmal anxiety] without agoraphobia: Secondary | ICD-10-CM | POA: Diagnosis not present

## 2020-06-11 DIAGNOSIS — L120 Bullous pemphigoid: Secondary | ICD-10-CM | POA: Diagnosis not present

## 2020-06-11 DIAGNOSIS — Z79899 Other long term (current) drug therapy: Secondary | ICD-10-CM | POA: Diagnosis not present

## 2020-06-11 DIAGNOSIS — L570 Actinic keratosis: Secondary | ICD-10-CM | POA: Diagnosis not present

## 2020-06-11 DIAGNOSIS — Z85828 Personal history of other malignant neoplasm of skin: Secondary | ICD-10-CM | POA: Diagnosis not present

## 2020-07-09 DIAGNOSIS — Z85828 Personal history of other malignant neoplasm of skin: Secondary | ICD-10-CM | POA: Diagnosis not present

## 2020-07-09 DIAGNOSIS — L120 Bullous pemphigoid: Secondary | ICD-10-CM | POA: Diagnosis not present

## 2020-07-09 DIAGNOSIS — I872 Venous insufficiency (chronic) (peripheral): Secondary | ICD-10-CM | POA: Diagnosis not present

## 2020-07-09 DIAGNOSIS — I8311 Varicose veins of right lower extremity with inflammation: Secondary | ICD-10-CM | POA: Diagnosis not present

## 2020-07-09 DIAGNOSIS — I8312 Varicose veins of left lower extremity with inflammation: Secondary | ICD-10-CM | POA: Diagnosis not present

## 2020-07-10 DIAGNOSIS — D692 Other nonthrombocytopenic purpura: Secondary | ICD-10-CM | POA: Diagnosis not present

## 2020-07-10 DIAGNOSIS — I872 Venous insufficiency (chronic) (peripheral): Secondary | ICD-10-CM | POA: Diagnosis not present

## 2020-07-10 DIAGNOSIS — M48061 Spinal stenosis, lumbar region without neurogenic claudication: Secondary | ICD-10-CM | POA: Diagnosis not present

## 2020-07-10 DIAGNOSIS — Z23 Encounter for immunization: Secondary | ICD-10-CM | POA: Diagnosis not present

## 2020-07-10 DIAGNOSIS — Z0000 Encounter for general adult medical examination without abnormal findings: Secondary | ICD-10-CM | POA: Diagnosis not present

## 2020-07-10 DIAGNOSIS — I10 Essential (primary) hypertension: Secondary | ICD-10-CM | POA: Diagnosis not present

## 2020-07-10 DIAGNOSIS — Z1389 Encounter for screening for other disorder: Secondary | ICD-10-CM | POA: Diagnosis not present

## 2020-07-10 DIAGNOSIS — E7800 Pure hypercholesterolemia, unspecified: Secondary | ICD-10-CM | POA: Diagnosis not present

## 2020-07-26 DIAGNOSIS — R600 Localized edema: Secondary | ICD-10-CM | POA: Diagnosis not present

## 2020-07-26 DIAGNOSIS — I10 Essential (primary) hypertension: Secondary | ICD-10-CM | POA: Diagnosis not present

## 2020-07-26 DIAGNOSIS — L120 Bullous pemphigoid: Secondary | ICD-10-CM | POA: Diagnosis not present

## 2020-07-26 DIAGNOSIS — I872 Venous insufficiency (chronic) (peripheral): Secondary | ICD-10-CM | POA: Diagnosis not present

## 2020-10-02 DIAGNOSIS — Z79899 Other long term (current) drug therapy: Secondary | ICD-10-CM | POA: Diagnosis not present

## 2020-10-02 DIAGNOSIS — L120 Bullous pemphigoid: Secondary | ICD-10-CM | POA: Diagnosis not present

## 2020-10-02 DIAGNOSIS — I872 Venous insufficiency (chronic) (peripheral): Secondary | ICD-10-CM | POA: Diagnosis not present

## 2020-10-02 DIAGNOSIS — I8311 Varicose veins of right lower extremity with inflammation: Secondary | ICD-10-CM | POA: Diagnosis not present

## 2020-10-02 DIAGNOSIS — I8312 Varicose veins of left lower extremity with inflammation: Secondary | ICD-10-CM | POA: Diagnosis not present

## 2020-10-02 DIAGNOSIS — L570 Actinic keratosis: Secondary | ICD-10-CM | POA: Diagnosis not present

## 2020-10-02 DIAGNOSIS — Z85828 Personal history of other malignant neoplasm of skin: Secondary | ICD-10-CM | POA: Diagnosis not present

## 2020-11-04 DIAGNOSIS — L120 Bullous pemphigoid: Secondary | ICD-10-CM | POA: Diagnosis not present

## 2020-11-04 DIAGNOSIS — I10 Essential (primary) hypertension: Secondary | ICD-10-CM | POA: Diagnosis not present

## 2020-11-04 DIAGNOSIS — I872 Venous insufficiency (chronic) (peripheral): Secondary | ICD-10-CM | POA: Diagnosis not present

## 2020-12-10 DIAGNOSIS — H1820 Unspecified corneal edema: Secondary | ICD-10-CM | POA: Diagnosis not present

## 2020-12-10 DIAGNOSIS — H2513 Age-related nuclear cataract, bilateral: Secondary | ICD-10-CM | POA: Diagnosis not present

## 2020-12-10 DIAGNOSIS — H5203 Hypermetropia, bilateral: Secondary | ICD-10-CM | POA: Diagnosis not present

## 2020-12-10 DIAGNOSIS — H1789 Other corneal scars and opacities: Secondary | ICD-10-CM | POA: Diagnosis not present

## 2020-12-11 DIAGNOSIS — L603 Nail dystrophy: Secondary | ICD-10-CM | POA: Diagnosis not present

## 2020-12-11 DIAGNOSIS — L821 Other seborrheic keratosis: Secondary | ICD-10-CM | POA: Diagnosis not present

## 2020-12-11 DIAGNOSIS — Z85828 Personal history of other malignant neoplasm of skin: Secondary | ICD-10-CM | POA: Diagnosis not present

## 2020-12-11 DIAGNOSIS — L120 Bullous pemphigoid: Secondary | ICD-10-CM | POA: Diagnosis not present

## 2020-12-25 ENCOUNTER — Ambulatory Visit: Payer: Medicare PPO | Admitting: Podiatry

## 2021-01-02 DIAGNOSIS — H1820 Unspecified corneal edema: Secondary | ICD-10-CM | POA: Diagnosis not present

## 2021-01-02 DIAGNOSIS — H1789 Other corneal scars and opacities: Secondary | ICD-10-CM | POA: Diagnosis not present

## 2021-01-14 DIAGNOSIS — I10 Essential (primary) hypertension: Secondary | ICD-10-CM | POA: Diagnosis not present

## 2021-01-14 DIAGNOSIS — I872 Venous insufficiency (chronic) (peripheral): Secondary | ICD-10-CM | POA: Diagnosis not present

## 2021-01-14 DIAGNOSIS — Z23 Encounter for immunization: Secondary | ICD-10-CM | POA: Diagnosis not present

## 2021-02-03 DIAGNOSIS — L821 Other seborrheic keratosis: Secondary | ICD-10-CM | POA: Diagnosis not present

## 2021-02-03 DIAGNOSIS — Z79899 Other long term (current) drug therapy: Secondary | ICD-10-CM | POA: Diagnosis not present

## 2021-02-03 DIAGNOSIS — L120 Bullous pemphigoid: Secondary | ICD-10-CM | POA: Diagnosis not present

## 2021-02-03 DIAGNOSIS — L820 Inflamed seborrheic keratosis: Secondary | ICD-10-CM | POA: Diagnosis not present

## 2021-02-03 DIAGNOSIS — Z85828 Personal history of other malignant neoplasm of skin: Secondary | ICD-10-CM | POA: Diagnosis not present

## 2021-02-03 DIAGNOSIS — D692 Other nonthrombocytopenic purpura: Secondary | ICD-10-CM | POA: Diagnosis not present

## 2021-03-04 DIAGNOSIS — H524 Presbyopia: Secondary | ICD-10-CM | POA: Diagnosis not present

## 2021-03-04 DIAGNOSIS — H35033 Hypertensive retinopathy, bilateral: Secondary | ICD-10-CM | POA: Diagnosis not present

## 2021-03-10 DIAGNOSIS — I10 Essential (primary) hypertension: Secondary | ICD-10-CM | POA: Diagnosis not present

## 2021-03-18 DIAGNOSIS — H1789 Other corneal scars and opacities: Secondary | ICD-10-CM | POA: Diagnosis not present

## 2021-05-08 DIAGNOSIS — L03116 Cellulitis of left lower limb: Secondary | ICD-10-CM | POA: Diagnosis not present

## 2021-05-10 DIAGNOSIS — L03116 Cellulitis of left lower limb: Secondary | ICD-10-CM | POA: Diagnosis not present

## 2021-05-13 DIAGNOSIS — S81812A Laceration without foreign body, left lower leg, initial encounter: Secondary | ICD-10-CM | POA: Diagnosis not present

## 2021-05-14 ENCOUNTER — Encounter (HOSPITAL_BASED_OUTPATIENT_CLINIC_OR_DEPARTMENT_OTHER): Payer: Medicare PPO | Admitting: Internal Medicine

## 2021-05-22 DIAGNOSIS — Z85828 Personal history of other malignant neoplasm of skin: Secondary | ICD-10-CM | POA: Diagnosis not present

## 2021-05-22 DIAGNOSIS — S80812A Abrasion, left lower leg, initial encounter: Secondary | ICD-10-CM | POA: Diagnosis not present

## 2021-05-28 DIAGNOSIS — M25511 Pain in right shoulder: Secondary | ICD-10-CM | POA: Diagnosis not present

## 2021-07-10 ENCOUNTER — Inpatient Hospital Stay (HOSPITAL_COMMUNITY)
Admission: EM | Admit: 2021-07-10 | Discharge: 2021-07-16 | DRG: 291 | Disposition: A | Discharge status: Skilled Nursing Facility | Payer: Medicare PPO | Attending: Student | Admitting: Student

## 2021-07-10 ENCOUNTER — Emergency Department (HOSPITAL_COMMUNITY): Payer: Medicare PPO

## 2021-07-10 ENCOUNTER — Other Ambulatory Visit: Payer: Self-pay

## 2021-07-10 ENCOUNTER — Encounter (HOSPITAL_COMMUNITY): Payer: Self-pay

## 2021-07-10 DIAGNOSIS — S61411A Laceration without foreign body of right hand, initial encounter: Secondary | ICD-10-CM | POA: Diagnosis present

## 2021-07-10 DIAGNOSIS — L258 Unspecified contact dermatitis due to other agents: Secondary | ICD-10-CM | POA: Diagnosis present

## 2021-07-10 DIAGNOSIS — R600 Localized edema: Secondary | ICD-10-CM | POA: Diagnosis not present

## 2021-07-10 DIAGNOSIS — S0011XA Contusion of right eyelid and periocular area, initial encounter: Secondary | ICD-10-CM | POA: Diagnosis present

## 2021-07-10 DIAGNOSIS — Y92007 Garden or yard of unspecified non-institutional (private) residence as the place of occurrence of the external cause: Secondary | ICD-10-CM | POA: Diagnosis not present

## 2021-07-10 DIAGNOSIS — R0902 Hypoxemia: Secondary | ICD-10-CM | POA: Diagnosis not present

## 2021-07-10 DIAGNOSIS — J9601 Acute respiratory failure with hypoxia: Secondary | ICD-10-CM | POA: Diagnosis present

## 2021-07-10 DIAGNOSIS — R9431 Abnormal electrocardiogram [ECG] [EKG]: Secondary | ICD-10-CM | POA: Diagnosis not present

## 2021-07-10 DIAGNOSIS — Z87891 Personal history of nicotine dependence: Secondary | ICD-10-CM

## 2021-07-10 DIAGNOSIS — R0602 Shortness of breath: Secondary | ICD-10-CM | POA: Diagnosis present

## 2021-07-10 DIAGNOSIS — L309 Dermatitis, unspecified: Secondary | ICD-10-CM | POA: Diagnosis not present

## 2021-07-10 DIAGNOSIS — S20211A Contusion of right front wall of thorax, initial encounter: Secondary | ICD-10-CM | POA: Diagnosis present

## 2021-07-10 DIAGNOSIS — R031 Nonspecific low blood-pressure reading: Secondary | ICD-10-CM | POA: Diagnosis not present

## 2021-07-10 DIAGNOSIS — R609 Edema, unspecified: Secondary | ICD-10-CM | POA: Diagnosis not present

## 2021-07-10 DIAGNOSIS — S21111A Laceration without foreign body of right front wall of thorax without penetration into thoracic cavity, initial encounter: Secondary | ICD-10-CM | POA: Diagnosis not present

## 2021-07-10 DIAGNOSIS — L120 Bullous pemphigoid: Secondary | ICD-10-CM | POA: Diagnosis present

## 2021-07-10 DIAGNOSIS — N400 Enlarged prostate without lower urinary tract symptoms: Secondary | ICD-10-CM | POA: Diagnosis present

## 2021-07-10 DIAGNOSIS — N289 Disorder of kidney and ureter, unspecified: Secondary | ICD-10-CM | POA: Diagnosis not present

## 2021-07-10 DIAGNOSIS — L538 Other specified erythematous conditions: Secondary | ICD-10-CM | POA: Diagnosis not present

## 2021-07-10 DIAGNOSIS — Z043 Encounter for examination and observation following other accident: Secondary | ICD-10-CM | POA: Diagnosis not present

## 2021-07-10 DIAGNOSIS — J45909 Unspecified asthma, uncomplicated: Secondary | ICD-10-CM

## 2021-07-10 DIAGNOSIS — Z8249 Family history of ischemic heart disease and other diseases of the circulatory system: Secondary | ICD-10-CM | POA: Diagnosis not present

## 2021-07-10 DIAGNOSIS — Z79899 Other long term (current) drug therapy: Secondary | ICD-10-CM | POA: Diagnosis not present

## 2021-07-10 DIAGNOSIS — Z23 Encounter for immunization: Secondary | ICD-10-CM | POA: Diagnosis present

## 2021-07-10 DIAGNOSIS — R7989 Other specified abnormal findings of blood chemistry: Secondary | ICD-10-CM | POA: Diagnosis not present

## 2021-07-10 DIAGNOSIS — Z66 Do not resuscitate: Secondary | ICD-10-CM | POA: Diagnosis present

## 2021-07-10 DIAGNOSIS — I509 Heart failure, unspecified: Secondary | ICD-10-CM

## 2021-07-10 DIAGNOSIS — L03115 Cellulitis of right lower limb: Secondary | ICD-10-CM

## 2021-07-10 DIAGNOSIS — I5033 Acute on chronic diastolic (congestive) heart failure: Secondary | ICD-10-CM

## 2021-07-10 DIAGNOSIS — Z96643 Presence of artificial hip joint, bilateral: Secondary | ICD-10-CM | POA: Diagnosis present

## 2021-07-10 DIAGNOSIS — W19XXXA Unspecified fall, initial encounter: Secondary | ICD-10-CM | POA: Diagnosis not present

## 2021-07-10 DIAGNOSIS — I110 Hypertensive heart disease with heart failure: Secondary | ICD-10-CM | POA: Diagnosis present

## 2021-07-10 DIAGNOSIS — L259 Unspecified contact dermatitis, unspecified cause: Secondary | ICD-10-CM | POA: Diagnosis not present

## 2021-07-10 DIAGNOSIS — R531 Weakness: Secondary | ICD-10-CM | POA: Diagnosis not present

## 2021-07-10 DIAGNOSIS — Z96651 Presence of right artificial knee joint: Secondary | ICD-10-CM | POA: Diagnosis present

## 2021-07-10 DIAGNOSIS — N189 Chronic kidney disease, unspecified: Secondary | ICD-10-CM | POA: Diagnosis not present

## 2021-07-10 DIAGNOSIS — E871 Hypo-osmolality and hyponatremia: Secondary | ICD-10-CM | POA: Diagnosis present

## 2021-07-10 DIAGNOSIS — I10 Essential (primary) hypertension: Secondary | ICD-10-CM

## 2021-07-10 DIAGNOSIS — I872 Venous insufficiency (chronic) (peripheral): Secondary | ICD-10-CM | POA: Diagnosis present

## 2021-07-10 DIAGNOSIS — D649 Anemia, unspecified: Secondary | ICD-10-CM | POA: Diagnosis not present

## 2021-07-10 DIAGNOSIS — Z20822 Contact with and (suspected) exposure to covid-19: Secondary | ICD-10-CM | POA: Diagnosis present

## 2021-07-10 DIAGNOSIS — L02415 Cutaneous abscess of right lower limb: Secondary | ICD-10-CM | POA: Diagnosis not present

## 2021-07-10 DIAGNOSIS — M7989 Other specified soft tissue disorders: Secondary | ICD-10-CM | POA: Diagnosis not present

## 2021-07-10 DIAGNOSIS — S81811A Laceration without foreign body, right lower leg, initial encounter: Secondary | ICD-10-CM | POA: Diagnosis not present

## 2021-07-10 DIAGNOSIS — M79604 Pain in right leg: Secondary | ICD-10-CM | POA: Diagnosis not present

## 2021-07-10 DIAGNOSIS — Z7401 Bed confinement status: Secondary | ICD-10-CM | POA: Diagnosis not present

## 2021-07-10 DIAGNOSIS — I5031 Acute diastolic (congestive) heart failure: Secondary | ICD-10-CM | POA: Diagnosis not present

## 2021-07-10 DIAGNOSIS — N179 Acute kidney failure, unspecified: Secondary | ICD-10-CM | POA: Diagnosis present

## 2021-07-10 DIAGNOSIS — T148XXA Other injury of unspecified body region, initial encounter: Secondary | ICD-10-CM

## 2021-07-10 DIAGNOSIS — R5381 Other malaise: Secondary | ICD-10-CM | POA: Diagnosis not present

## 2021-07-10 DIAGNOSIS — E785 Hyperlipidemia, unspecified: Secondary | ICD-10-CM | POA: Diagnosis not present

## 2021-07-10 DIAGNOSIS — J8410 Pulmonary fibrosis, unspecified: Secondary | ICD-10-CM | POA: Diagnosis not present

## 2021-07-10 DIAGNOSIS — R52 Pain, unspecified: Secondary | ICD-10-CM | POA: Diagnosis not present

## 2021-07-10 DIAGNOSIS — S0083XA Contusion of other part of head, initial encounter: Secondary | ICD-10-CM | POA: Diagnosis not present

## 2021-07-10 DIAGNOSIS — R5383 Other fatigue: Secondary | ICD-10-CM | POA: Diagnosis not present

## 2021-07-10 DIAGNOSIS — M4312 Spondylolisthesis, cervical region: Secondary | ICD-10-CM | POA: Diagnosis not present

## 2021-07-10 LAB — CBC WITH DIFFERENTIAL/PLATELET
Abs Immature Granulocytes: 0.18 10*3/uL — ABNORMAL HIGH (ref 0.00–0.07)
Basophils Absolute: 0 10*3/uL (ref 0.0–0.1)
Basophils Relative: 0 %
Eosinophils Absolute: 0.1 10*3/uL (ref 0.0–0.5)
Eosinophils Relative: 1 %
HCT: 33.4 % — ABNORMAL LOW (ref 39.0–52.0)
Hemoglobin: 11.5 g/dL — ABNORMAL LOW (ref 13.0–17.0)
Immature Granulocytes: 1 %
Lymphocytes Relative: 7 %
Lymphs Abs: 1.1 10*3/uL (ref 0.7–4.0)
MCH: 35.7 pg — ABNORMAL HIGH (ref 26.0–34.0)
MCHC: 34.4 g/dL (ref 30.0–36.0)
MCV: 103.7 fL — ABNORMAL HIGH (ref 80.0–100.0)
Monocytes Absolute: 1.7 10*3/uL — ABNORMAL HIGH (ref 0.1–1.0)
Monocytes Relative: 11 %
Neutro Abs: 12.2 10*3/uL — ABNORMAL HIGH (ref 1.7–7.7)
Neutrophils Relative %: 80 %
Platelets: 210 10*3/uL (ref 150–400)
RBC: 3.22 MIL/uL — ABNORMAL LOW (ref 4.22–5.81)
RDW: 13.3 % (ref 11.5–15.5)
WBC: 15.3 10*3/uL — ABNORMAL HIGH (ref 4.0–10.5)
nRBC: 0 % (ref 0.0–0.2)

## 2021-07-10 LAB — COMPREHENSIVE METABOLIC PANEL
ALT: 45 U/L — ABNORMAL HIGH (ref 0–44)
AST: 29 U/L (ref 15–41)
Albumin: 2.9 g/dL — ABNORMAL LOW (ref 3.5–5.0)
Alkaline Phosphatase: 84 U/L (ref 38–126)
Anion gap: 6 (ref 5–15)
BUN: 33 mg/dL — ABNORMAL HIGH (ref 8–23)
CO2: 28 mmol/L (ref 22–32)
Calcium: 8.8 mg/dL — ABNORMAL LOW (ref 8.9–10.3)
Chloride: 94 mmol/L — ABNORMAL LOW (ref 98–111)
Creatinine, Ser: 1.56 mg/dL — ABNORMAL HIGH (ref 0.61–1.24)
GFR, Estimated: 43 mL/min — ABNORMAL LOW (ref >60)
Glucose, Bld: 126 mg/dL — ABNORMAL HIGH (ref 70–99)
Potassium: 4.7 mmol/L (ref 3.5–5.1)
Sodium: 128 mmol/L — ABNORMAL LOW (ref 135–145)
Total Bilirubin: 0.8 mg/dL (ref 0.3–1.2)
Total Protein: 7.1 g/dL (ref 6.5–8.1)

## 2021-07-10 LAB — BRAIN NATRIURETIC PEPTIDE: B Natriuretic Peptide: 236.7 pg/mL — ABNORMAL HIGH (ref 0.0–100.0)

## 2021-07-10 LAB — RESP PANEL BY RT-PCR (FLU A&B, COVID) ARPGX2
Influenza A by PCR: NEGATIVE
Influenza B by PCR: NEGATIVE
SARS Coronavirus 2 by RT PCR: NEGATIVE

## 2021-07-10 LAB — TROPONIN I (HIGH SENSITIVITY): Troponin I (High Sensitivity): 3 ng/L (ref <18)

## 2021-07-10 MED ORDER — TETANUS-DIPHTH-ACELL PERTUSSIS 5-2.5-18.5 LF-MCG/0.5 IM SUSY
0.5000 mL | PREFILLED_SYRINGE | Freq: Once | INTRAMUSCULAR | Status: AC
  Administered 2021-07-10: 0.5 mL via INTRAMUSCULAR
  Filled 2021-07-10: qty 0.5

## 2021-07-10 MED ORDER — IOHEXOL 350 MG/ML SOLN
80.0000 mL | Freq: Once | INTRAVENOUS | Status: AC | PRN
  Administered 2021-07-10: 80 mL via INTRAVENOUS

## 2021-07-10 MED ORDER — SODIUM CHLORIDE 0.9 % IV BOLUS
500.0000 mL | Freq: Once | INTRAVENOUS | Status: AC
Start: 2021-07-10 — End: 2021-07-11
  Administered 2021-07-10: 500 mL via INTRAVENOUS

## 2021-07-10 NOTE — ED Triage Notes (Signed)
Pt BIB EMS from home after falling off a curb. He fell on his right side and has a hematoma on the right eyebrow w/ an abrasion to the right hand and elbow. He also has bruising on the right side of his chest and is "very soaked and cold" after falling in the rain. No loss of LOC.  168/90 bp 70 hr

## 2021-07-10 NOTE — ED Provider Notes (Signed)
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 continues to assure me that he has no fevers, cough, and does not think he has pneumonia.  10:50 PM As soon as patient sat up to try to walk, oxygen dropped to 82%.  I am concerned about this new hypoxia, the fatigue, the cellulitis on his right leg, AKI, and hyponatremia.  We will order some fluids for the kidneys but the same time being gentle due to elevated BNP, will speak with medicine about antibiotics for the cellulitis to continue, and he will need monitoring for this exertional hypoxia.         Final Clinical Impression(s) / ED Diagnoses Final diagnoses:  Hypoxia  Fall, initial encounter  Multiple skin tears  AKI (acute kidney injury) ([place])  Hyponatremia     Clinical Impression: 1. Hypoxia   2. Fall, initial encounter   3. Multiple skin tears   4. AKI (acute kidney injury) ([place])   5.  Hyponatremia     Disposition: Admit  This note was prepared  with assistance of Systems analyst. Occasional wrong-word or sound-a-like substitutions may have occurred due to the inherent limitations of voice recognition software.      Kemarion Abbey, Gwenyth Allegra, MD 07/10/21 [phone]

## 2021-07-10 NOTE — ED Notes (Signed)
Patient given clean gown and urinal.

## 2021-07-10 NOTE — ED Notes (Signed)
Patient oxygen saturation dropped to 82% while ambulating in place.  Patient  very weak and required 2 person assist.

## 2021-07-11 ENCOUNTER — Observation Stay (HOSPITAL_COMMUNITY): Payer: Medicare PPO

## 2021-07-11 ENCOUNTER — Observation Stay (HOSPITAL_BASED_OUTPATIENT_CLINIC_OR_DEPARTMENT_OTHER): Payer: Medicare PPO

## 2021-07-11 ENCOUNTER — Encounter (HOSPITAL_COMMUNITY): Payer: Self-pay | Admitting: Family Medicine

## 2021-07-11 DIAGNOSIS — S61411A Laceration without foreign body of right hand, initial encounter: Secondary | ICD-10-CM | POA: Diagnosis present

## 2021-07-11 DIAGNOSIS — S20211A Contusion of right front wall of thorax, initial encounter: Secondary | ICD-10-CM | POA: Diagnosis present

## 2021-07-11 DIAGNOSIS — Z96651 Presence of right artificial knee joint: Secondary | ICD-10-CM | POA: Diagnosis present

## 2021-07-11 DIAGNOSIS — L120 Bullous pemphigoid: Secondary | ICD-10-CM | POA: Diagnosis present

## 2021-07-11 DIAGNOSIS — I5033 Acute on chronic diastolic (congestive) heart failure: Secondary | ICD-10-CM | POA: Diagnosis present

## 2021-07-11 DIAGNOSIS — J45909 Unspecified asthma, uncomplicated: Secondary | ICD-10-CM | POA: Diagnosis present

## 2021-07-11 DIAGNOSIS — S0011XA Contusion of right eyelid and periocular area, initial encounter: Secondary | ICD-10-CM | POA: Diagnosis present

## 2021-07-11 DIAGNOSIS — Y92007 Garden or yard of unspecified non-institutional (private) residence as the place of occurrence of the external cause: Secondary | ICD-10-CM | POA: Diagnosis not present

## 2021-07-11 DIAGNOSIS — M7989 Other specified soft tissue disorders: Secondary | ICD-10-CM

## 2021-07-11 DIAGNOSIS — L03115 Cellulitis of right lower limb: Secondary | ICD-10-CM | POA: Diagnosis present

## 2021-07-11 DIAGNOSIS — N179 Acute kidney failure, unspecified: Secondary | ICD-10-CM

## 2021-07-11 DIAGNOSIS — L258 Unspecified contact dermatitis due to other agents: Secondary | ICD-10-CM | POA: Diagnosis present

## 2021-07-11 DIAGNOSIS — Z23 Encounter for immunization: Secondary | ICD-10-CM | POA: Diagnosis present

## 2021-07-11 DIAGNOSIS — M79604 Pain in right leg: Secondary | ICD-10-CM | POA: Diagnosis not present

## 2021-07-11 DIAGNOSIS — I509 Heart failure, unspecified: Secondary | ICD-10-CM

## 2021-07-11 DIAGNOSIS — L538 Other specified erythematous conditions: Secondary | ICD-10-CM

## 2021-07-11 DIAGNOSIS — I5031 Acute diastolic (congestive) heart failure: Secondary | ICD-10-CM | POA: Diagnosis not present

## 2021-07-11 DIAGNOSIS — Z79899 Other long term (current) drug therapy: Secondary | ICD-10-CM | POA: Diagnosis not present

## 2021-07-11 DIAGNOSIS — N289 Disorder of kidney and ureter, unspecified: Secondary | ICD-10-CM

## 2021-07-11 DIAGNOSIS — Z96643 Presence of artificial hip joint, bilateral: Secondary | ICD-10-CM | POA: Diagnosis present

## 2021-07-11 DIAGNOSIS — I110 Hypertensive heart disease with heart failure: Secondary | ICD-10-CM | POA: Diagnosis present

## 2021-07-11 DIAGNOSIS — J9601 Acute respiratory failure with hypoxia: Secondary | ICD-10-CM | POA: Diagnosis present

## 2021-07-11 DIAGNOSIS — I872 Venous insufficiency (chronic) (peripheral): Secondary | ICD-10-CM | POA: Diagnosis present

## 2021-07-11 DIAGNOSIS — Z66 Do not resuscitate: Secondary | ICD-10-CM | POA: Diagnosis present

## 2021-07-11 DIAGNOSIS — Z20822 Contact with and (suspected) exposure to covid-19: Secondary | ICD-10-CM | POA: Diagnosis present

## 2021-07-11 DIAGNOSIS — R0602 Shortness of breath: Secondary | ICD-10-CM | POA: Diagnosis present

## 2021-07-11 DIAGNOSIS — E871 Hypo-osmolality and hyponatremia: Secondary | ICD-10-CM | POA: Diagnosis present

## 2021-07-11 DIAGNOSIS — Z87891 Personal history of nicotine dependence: Secondary | ICD-10-CM | POA: Diagnosis not present

## 2021-07-11 DIAGNOSIS — Z8249 Family history of ischemic heart disease and other diseases of the circulatory system: Secondary | ICD-10-CM | POA: Diagnosis not present

## 2021-07-11 DIAGNOSIS — N400 Enlarged prostate without lower urinary tract symptoms: Secondary | ICD-10-CM | POA: Diagnosis present

## 2021-07-11 LAB — ECHOCARDIOGRAM COMPLETE
AR max vel: 2.37 cm2
AV Area VTI: 2.34 cm2
AV Area mean vel: 2.45 cm2
AV Mean grad: 3 mmHg
AV Peak grad: 6.6 mmHg
Ao pk vel: 1.28 m/s
Area-P 1/2: 2.73 cm2
Calc EF: 55.4 %
Height: 71 in
P 1/2 time: 1136 msec
S' Lateral: 2.3 cm
Single Plane A2C EF: 62.8 %
Single Plane A4C EF: 51.6 %
Weight: 4592.62 oz

## 2021-07-11 LAB — CBC
HCT: 30.9 % — ABNORMAL LOW (ref 39.0–52.0)
Hemoglobin: 10.6 g/dL — ABNORMAL LOW (ref 13.0–17.0)
MCH: 35.6 pg — ABNORMAL HIGH (ref 26.0–34.0)
MCHC: 34.3 g/dL (ref 30.0–36.0)
MCV: 103.7 fL — ABNORMAL HIGH (ref 80.0–100.0)
Platelets: 187 10*3/uL (ref 150–400)
RBC: 2.98 MIL/uL — ABNORMAL LOW (ref 4.22–5.81)
RDW: 13.2 % (ref 11.5–15.5)
WBC: 11.1 10*3/uL — ABNORMAL HIGH (ref 4.0–10.5)
nRBC: 0 % (ref 0.0–0.2)

## 2021-07-11 LAB — HEPATIC FUNCTION PANEL
ALT: 38 U/L (ref 0–44)
AST: 20 U/L (ref 15–41)
Albumin: 2.7 g/dL — ABNORMAL LOW (ref 3.5–5.0)
Alkaline Phosphatase: 69 U/L (ref 38–126)
Bilirubin, Direct: 0.1 mg/dL (ref 0.0–0.2)
Indirect Bilirubin: 0.5 mg/dL (ref 0.3–0.9)
Total Bilirubin: 0.6 mg/dL (ref 0.3–1.2)
Total Protein: 6.3 g/dL — ABNORMAL LOW (ref 6.5–8.1)

## 2021-07-11 LAB — BASIC METABOLIC PANEL
Anion gap: 7 (ref 5–15)
BUN: 27 mg/dL — ABNORMAL HIGH (ref 8–23)
CO2: 26 mmol/L (ref 22–32)
Calcium: 8.7 mg/dL — ABNORMAL LOW (ref 8.9–10.3)
Chloride: 100 mmol/L (ref 98–111)
Creatinine, Ser: 1.2 mg/dL (ref 0.61–1.24)
GFR, Estimated: 59 mL/min — ABNORMAL LOW (ref >60)
Glucose, Bld: 108 mg/dL — ABNORMAL HIGH (ref 70–99)
Potassium: 4.5 mmol/L (ref 3.5–5.1)
Sodium: 133 mmol/L — ABNORMAL LOW (ref 135–145)

## 2021-07-11 LAB — TROPONIN I (HIGH SENSITIVITY): Troponin I (High Sensitivity): 4 ng/L (ref <18)

## 2021-07-11 LAB — MAGNESIUM: Magnesium: 2.1 mg/dL (ref 1.7–2.4)

## 2021-07-11 LAB — PROCALCITONIN: Procalcitonin: <0.1 ng/mL

## 2021-07-11 MED ORDER — MONTELUKAST SODIUM 10 MG PO TABS
10.0000 mg | ORAL_TABLET | Freq: Every day | ORAL | Status: DC
  Administered 2021-07-11 – 2021-07-16 (×6): 10 mg via ORAL
  Filled 2021-07-11 (×6): qty 1

## 2021-07-11 MED ORDER — AMLODIPINE BESYLATE 5 MG PO TABS
2.5000 mg | ORAL_TABLET | Freq: Every day | ORAL | Status: DC
  Administered 2021-07-11 – 2021-07-16 (×6): 2.5 mg via ORAL
  Filled 2021-07-11 (×6): qty 1

## 2021-07-11 MED ORDER — FOLIC ACID 1 MG PO TABS
1.0000 mg | ORAL_TABLET | Freq: Every day | ORAL | Status: DC
  Administered 2021-07-11 – 2021-07-16 (×6): 1 mg via ORAL
  Filled 2021-07-11 (×6): qty 1

## 2021-07-11 MED ORDER — CLONIDINE HCL 0.2 MG PO TABS
0.2000 mg | ORAL_TABLET | Freq: Two times a day (BID) | ORAL | Status: DC
  Administered 2021-07-11 – 2021-07-16 (×11): 0.2 mg via ORAL
  Filled 2021-07-11 (×8): qty 1
  Filled 2021-07-11: qty 2
  Filled 2021-07-11 (×2): qty 1

## 2021-07-11 MED ORDER — SENNOSIDES-DOCUSATE SODIUM 8.6-50 MG PO TABS
1.0000 | ORAL_TABLET | Freq: Every evening | ORAL | Status: DC | PRN
  Administered 2021-07-14: 1 via ORAL
  Filled 2021-07-11: qty 1

## 2021-07-11 MED ORDER — DOXYCYCLINE HYCLATE 100 MG PO TABS
100.0000 mg | ORAL_TABLET | Freq: Two times a day (BID) | ORAL | Status: DC
  Administered 2021-07-11 – 2021-07-16 (×11): 100 mg via ORAL
  Filled 2021-07-11 (×11): qty 1

## 2021-07-11 MED ORDER — ALBUTEROL SULFATE (2.5 MG/3ML) 0.083% IN NEBU
3.0000 mL | INHALATION_SOLUTION | RESPIRATORY_TRACT | Status: DC | PRN
  Administered 2021-07-13: 3 mL via RESPIRATORY_TRACT
  Filled 2021-07-11: qty 3

## 2021-07-11 MED ORDER — SODIUM CHLORIDE 0.9% FLUSH
3.0000 mL | Freq: Two times a day (BID) | INTRAVENOUS | Status: DC
  Administered 2021-07-11 – 2021-07-16 (×9): 3 mL via INTRAVENOUS

## 2021-07-11 MED ORDER — ACETAMINOPHEN 650 MG RE SUPP: 650.0000 mg | Freq: Four times a day (QID) | RECTAL | Status: DC | PRN

## 2021-07-11 MED ORDER — ACETAMINOPHEN 325 MG PO TABS
650.0000 mg | ORAL_TABLET | Freq: Four times a day (QID) | ORAL | Status: DC | PRN
  Administered 2021-07-13 – 2021-07-16 (×3): 650 mg via ORAL
  Filled 2021-07-11 (×3): qty 2

## 2021-07-11 MED ORDER — PANTOPRAZOLE SODIUM 40 MG PO TBEC
40.0000 mg | DELAYED_RELEASE_TABLET | Freq: Every day | ORAL | Status: DC
  Administered 2021-07-11 – 2021-07-16 (×6): 40 mg via ORAL
  Filled 2021-07-11 (×6): qty 1

## 2021-07-11 MED ORDER — METOPROLOL SUCCINATE ER 50 MG PO TB24
150.0000 mg | ORAL_TABLET | Freq: Every day | ORAL | Status: DC
  Administered 2021-07-11 – 2021-07-16 (×6): 150 mg via ORAL
  Filled 2021-07-11 (×6): qty 3

## 2021-07-11 MED ORDER — FUROSEMIDE 10 MG/ML IJ SOLN
40.0000 mg | Freq: Two times a day (BID) | INTRAMUSCULAR | Status: DC
  Administered 2021-07-11 – 2021-07-15 (×9): 40 mg via INTRAVENOUS
  Filled 2021-07-11 (×10): qty 4

## 2021-07-11 MED ORDER — HEPARIN SODIUM (PORCINE) 5000 UNIT/ML IJ SOLN
5000.0000 [IU] | Freq: Three times a day (TID) | INTRAMUSCULAR | Status: DC
  Administered 2021-07-11 – 2021-07-16 (×16): 5000 [IU] via SUBCUTANEOUS
  Filled 2021-07-11 (×17): qty 1

## 2021-07-11 MED ORDER — CALCIUM CARBONATE ANTACID 500 MG PO CHEW
1.0000 | CHEWABLE_TABLET | Freq: Three times a day (TID) | ORAL | Status: DC | PRN
  Administered 2021-07-11 – 2021-07-15 (×3): 200 mg via ORAL
  Filled 2021-07-11 (×3): qty 1

## 2021-07-11 NOTE — ED Notes (Signed)
Replaced sheets Replaced pads Warm blankets given

## 2021-07-11 NOTE — ED Notes (Signed)
Patient called out for warm blankets Warm blankets given

## 2021-07-11 NOTE — H&P (Addendum)
History and Physical    [name] VZD:[id number] DOB: 03/07/1933 DOA: 07/10/2021  PCP: Lajean Manes, MD   Patient coming from: Home   Chief Complaint: Fall, low oxygen saturation, lab abnormality  HPI: [name] is a 88 y.o. male with medical history significant for chronic diastolic CHF, hypertension, asthma, bullous pemphigoid, and BMI 40, now presenting to the emergency department after a slip and fall.  Patient saw his PCP today for increased right leg swelling and redness, was started on doxycycline, had blood work performed including D-dimer which is said to be elevated to 2.2.  Following the visit, he slipped in the rain while getting out of a car, falling onto his right side.  He denies any significant pain but was noted to have periorbital hematoma on the right, some skin tears, and bruising to his chest, prompting family to bring him into the ED.  He does not feel particularly short of breath but family reports that he was hypoxic in the PCP office.  ED Course: Upon arrival to the ED, patient is found to be afebrile, saturating low 80s to low 90s on room air, and with stable blood pressure.  Chemistry panel notable for sodium 128 and creatinine 1.56.  CBC features a leukocytosis to 15,300 and mild microcytic anemia.  BNP is elevated to 237 with normal troponin.  Radiographs of the right hand, tip/fib, and right ribs are negative for acute fracture.  There is no acute intracranial abnormality on head CT, no definite maxillofacial fracture, and no definite acute cervical spine fracture.  CTA chest was negative for PE but notable for ectasia of the main pulmonary artery and ectatic ascending thoracic aorta.  Patient was given IV fluids and Tdap in the ED.  Review of Systems:  All other systems reviewed and apart from HPI, are negative.  Past Medical History:  Diagnosis Date   Anxiety    "pt. denies" 10-11-12   Arthritis    degenerative arthritis    Arthritis, lumbar  spine    BPH (benign prostatic hyperplasia)    Dysrhythmia    1st degree heart block  palpitations 04/07/2011, seen & complete eval. /w Dr. Radford Pax- stress, echo, holter monitor, has been cleared for surg.    Edema of both legs    improved with use of meds/ compression socks used.   Heart palpitations 2012   Hypertension    Pemphigoid    PONV (postoperative nausea and vomiting)    PVC's (premature ventricular contractions)    Spinal stenosis     Past Surgical History:  Procedure Laterality Date   BACK SURGERY  01/2011   02/2011   JOINT REPLACEMENT     R knee replacement-  10/06, L shoulder - 3/07, R hip- replacement- '07   TOTAL HIP ARTHROPLASTY  2007   right total hip replacement   TOTAL HIP ARTHROPLASTY Left 10/14/2012   Procedure: LEFT TOTAL HIP ARTHROPLASTY ANTERIOR APPROACH;  Surgeon: Mcarthur Rossetti, MD;  Location: [facility];  Service: Orthopedics;  Laterality: Left;   TOTAL KNEE ARTHROPLASTY  2007   right total knee   TOTAL KNEE ARTHROPLASTY  06/01/2011   Procedure: TOTAL KNEE ARTHROPLASTY;  Surgeon: Lorn Junes, MD;  Location: [place];  Service: Orthopedics;  Laterality: Left;   TOTAL SHOULDER ARTHROPLASTY Left 2007    Social History:   reports that he quit smoking about 37 years ago. He has never used smokeless tobacco. He reports that he does not drink alcohol and does not  use drugs.  Allergies  Allergen Reactions   Spironolactone Nausea Only   Hydrocodone Nausea And Vomiting    Family History  Problem Relation Age of Onset   Heart attack Mother    Hypertension Mother    Suicidality Father    Heart disease Sister    Anesthesia problems Neg Hx    Hypotension Neg Hx    Malignant hyperthermia Neg Hx    Pseudochol deficiency Neg Hx    Colon cancer Neg Hx    Esophageal cancer Neg Hx    Rectal cancer Neg Hx    Stomach cancer Neg Hx      Prior to Admission medications   Medication Sig Start Date End Date Taking? Authorizing Provider  aspirin EC 81 MG  tablet Take 81 mg by mouth daily. Swallow whole.   Yes [provider]  finasteride (PROSCAR) 5 MG tablet Take 5 mg by mouth every morning.    Yes [provider]  furosemide (LASIX) 40 MG tablet Take 40 mg by mouth daily as needed for fluid or edema. 06/04/19  Yes [provider]  gabapentin (NEURONTIN) 100 MG capsule Take 100 mg by mouth in the morning and at bedtime.   Yes [provider]  lisinopril (ZESTRIL) 20 MG tablet Take 20 mg by mouth 2 (two) times daily.   Yes [provider]  amLODipine (NORVASC) 2.5 MG tablet Take 2.5 mg by mouth daily. 06/01/18   [provider]  atorvastatin (LIPITOR) 10 MG tablet Take 10 mg by mouth daily.    [provider]  Cholecalciferol (VITAMIN D3) 125 MCG (5000 UT) CAPS Take 5,000 Units by mouth daily.    [provider]  cloNIDine (CATAPRES) 0.2 MG tablet Take 0.2 mg by mouth 2 (two) times daily.  06/09/16   [provider]  diclofenac (VOLTAREN) 75 MG EC tablet Take 75 mg by mouth 2 (two) times daily.    [provider]  eplerenone (INSPRA) 25 MG tablet Take 25 mg by mouth daily.    [provider]  folic acid (FOLVITE) 1 MG tablet Take 1 mg by mouth daily.    [provider]  methotrexate (RHEUMATREX) 2.5 MG tablet Take 10 mg by mouth every Friday. Caution:Chemotherapy. Protect from light.  Friday    [provider]  metoprolol (TOPROL-XL) 100 MG 24 hr tablet Take 150 mg by mouth every morning.     [provider]  montelukast (SINGULAIR) 10 MG tablet Take 10 mg by mouth daily. 05/18/18   [provider]  Multiple Vitamins-Minerals (CENTRUM SILVER 50+MEN PO) Take 1 tablet by mouth daily.    [provider]  omega-3 acid ethyl esters (LOVAZA) 1 g capsule Take 1 g by mouth 2 (two) times daily.    [provider]  polyethylene glycol (MIRALAX / GLYCOLAX) packet Take 17 g by mouth daily as needed for mild  constipation.    [provider]  quinapril (ACCUPRIL) 40 MG tablet Take 40 mg by mouth daily.  05/05/16   [provider]  traMADol (ULTRAM) 50 MG tablet Take 50 mg by mouth every 6 (six) hours as needed for moderate pain or severe pain.  05/30/13   [provider]    Physical Exam: Vitals:   07/10/21 1722 07/10/21 2031 07/11/21 0303 07/11/21 0400  BP: (!) 138/93 (!) 144/72 130/62 (!) 147/69  Pulse: 78 72 69 69  Resp: 20 18 16    Temp: 98.7 F (37.1 C)  TempSrc: Oral     SpO2: 92% 93% 93% 90%  Weight:      Height:        Constitutional: NAD, calm  Eyes: PERTLA, periorbital hematoma on right ENMT: Mucous membranes are moist. Posterior pharynx clear of any exudate or lesions.   Neck: supple, no masses  Respiratory: no wheezing, no crackles. No accessory muscle use.  Cardiovascular: S1 & S2 heard, regular rate and rhythm. Bilateral leg swelling, Rt>Lt.   Abdomen: No distension, no tenderness, soft. Bowel sounds active.  Musculoskeletal: no clubbing / cyanosis. No joint deformity upper and lower extremities.   Skin: Lower leg erythema, RUE abrasions, ecchymoses. Warm, dry, well-perfused. Neurologic: CN 2-12 grossly intact. Moving all extremities. Alert and oriented.  Psychiatric: Calm. Cooperative.    Labs and Imaging on Admission: I have personally reviewed following labs and imaging studies  CBC: Recent Labs  Lab 07/10/21 1824  WBC 15.3*  NEUTROABS 12.2*  HGB 11.5*  HCT 33.4*  MCV 103.7*  PLT 272   Basic Metabolic Panel: Recent Labs  Lab 07/10/21 1824  NA 128*  K 4.7  CL 94*  CO2 28  GLUCOSE 126*  BUN 33*  CREATININE 1.56*  CALCIUM 8.8*   GFR: Estimated Creatinine Clearance: 46.8 mL/min (A) (by C-G formula based on SCr of 1.56 mg/dL (H)). Liver Function Tests: Recent Labs  Lab 07/10/21 1824  AST 29  ALT 45*  ALKPHOS 84  BILITOT 0.8  PROT 7.1  ALBUMIN 2.9*   No results for input(s): LIPASE, AMYLASE in the last 168  hours. No results for input(s): AMMONIA in the last 168 hours. Coagulation Profile: No results for input(s): INR, PROTIME in the last 168 hours. Cardiac Enzymes: No results for input(s): CKTOTAL, CKMB, CKMBINDEX, TROPONINI in the last 168 hours. BNP (last 3 results) No results for input(s): PROBNP in the last 8760 hours. HbA1C: No results for input(s): HGBA1C in the last 72 hours. CBG: No results for input(s): GLUCAP in the last 168 hours. Lipid Profile: No results for input(s): CHOL, HDL, LDLCALC, TRIG, CHOLHDL, LDLDIRECT in the last 72 hours. Thyroid Function Tests: No results for input(s): TSH, T4TOTAL, FREET4, T3FREE, THYROIDAB in the last 72 hours. Anemia Panel: No results for input(s): VITAMINB12, FOLATE, FERRITIN, TIBC, IRON, RETICCTPCT in the last 72 hours. Urine analysis:    Component Value Date/Time   COLORURINE YELLOW 10/11/2012 [place] 10/11/2012 1105   LABSPEC 1.007 10/11/2012 1105   PHURINE 6.5 10/11/2012 1105   GLUCOSEU NEGATIVE 10/11/2012 1105   HGBUR NEGATIVE 10/11/2012 1105   BILIRUBINUR NEGATIVE 10/11/2012 1105   KETONESUR NEGATIVE 10/11/2012 1105   PROTEINUR NEGATIVE 10/11/2012 1105   UROBILINOGEN 0.2 10/11/2012 1105   NITRITE NEGATIVE 10/11/2012 1105   LEUKOCYTESUR NEGATIVE 10/11/2012 1105   Sepsis Labs: @LABRCNTIP (procalcitonin:4,lacticidven:4) ) Recent Results (from the past 240 hour(s))  Resp Panel by RT-PCR (Flu A&B, Covid) Nasopharyngeal Swab     Status: None   Collection Time: 07/10/21  6:25 PM   Specimen: Nasopharyngeal Swab; Nasopharyngeal(NP) swabs in vial transport medium  Result Value Ref Range Status   SARS Coronavirus 2 by RT PCR NEGATIVE NEGATIVE Final    Comment: (NOTE) SARS-CoV-2 target nucleic acids are NOT DETECTED.  The SARS-CoV-2 RNA is generally detectable in upper respiratory specimens during the acute phase of infection. The lowest concentration of SARS-CoV-2 viral copies this assay can detect is 138  copies/mL. A negative result does not preclude SARS-Cov-2 infection and should not be used as the sole basis for  treatment or other patient management decisions. A negative result may occur with  improper specimen collection/handling, submission of specimen other than nasopharyngeal swab, presence of viral mutation(s) within the areas targeted by this assay, and inadequate number of viral copies(<138 copies/mL). A negative result must be combined with clinical observations, patient history, and epidemiological information. The expected result is Negative.  Fact Sheet for Patients:  EntrepreneurPulse.com.au  Fact Sheet for Healthcare Providers:  IncredibleEmployment.be  This test is no t yet approved or cleared by the [place] FDA and  has been authorized for detection and/or diagnosis of SARS-CoV-2 by FDA under an Emergency Use Authorization (EUA). This EUA will remain  in effect (meaning this test can be used) for the duration of the COVID-19 declaration under Section 564(b)(1) of the Act, 21 U.S.C.section 360bbb-3(b)(1), unless the authorization is terminated  or revoked sooner.       Influenza A by PCR NEGATIVE NEGATIVE Final   Influenza B by PCR NEGATIVE NEGATIVE Final    Comment: (NOTE) The Xpert Xpress SARS-CoV-2/FLU/RSV plus assay is intended as an aid in the diagnosis of influenza from Nasopharyngeal swab specimens and should not be used as a sole basis for treatment. Nasal washings and aspirates are unacceptable for Xpert Xpress SARS-CoV-2/FLU/RSV testing.  Fact Sheet for Patients: EntrepreneurPulse.com.au  Fact Sheet for Healthcare Providers: IncredibleEmployment.be  This test is not yet approved or cleared by the [place] FDA and has been authorized for detection and/or diagnosis of SARS-CoV-2 by FDA under an Emergency Use Authorization (EUA). This EUA will remain in effect (meaning  this test can be used) for the duration of the COVID-19 declaration under Section 564(b)(1) of the Act, 21 U.S.C. section 360bbb-3(b)(1), unless the authorization is terminated or revoked.  Performed at [facility], [place] [address] [place], [place] [address]      Radiological Exams on Admission: DG Ribs Unilateral W/Chest Right  Result Date: 07/10/2021 CLINICAL DATA:  Fall with skin tear EXAM: RIGHT RIBS AND CHEST - 3+ VIEW COMPARISON:  01/11/2017 FINDINGS: Single view chest demonstrates chronic elevation of right diaphragm with subsegmental atelectasis. No acute consolidation, pleural effusion or pneumothorax. Stable cardiomediastinal silhouette. Right rib series demonstrates no acute displaced right rib fracture IMPRESSION: Negative. Electronically Signed   By: Donavan Foil M.D.   On: 07/10/2021 18:54   DG Tibia/Fibula Right  Result Date: 07/10/2021 CLINICAL DATA:  Fall with skin tear EXAM: RIGHT TIBIA AND FIBULA - 2 VIEW COMPARISON:  None. FINDINGS: Right knee replacement. No fracture or malalignment. Edema within the soft tissues. IMPRESSION: Right knee replacement without acute osseous abnormality Electronically Signed   By: Donavan Foil M.D.   On: 07/10/2021 18:51   CT HEAD WO CONTRAST (5MM)  Result Date: 07/10/2021 CLINICAL DATA:  Fall and hit right side with hematoma to eyebrow EXAM: CT HEAD WITHOUT CONTRAST CT MAXILLOFACIAL WITHOUT CONTRAST CT CERVICAL SPINE WITHOUT CONTRAST TECHNIQUE: Multidetector CT imaging of the head, cervical spine, and maxillofacial structures were performed using the standard protocol without intravenous contrast. Multiplanar CT image reconstructions of the cervical spine and maxillofacial structures were also generated. RADIATION DOSE REDUCTION: This exam was performed according to the departmental dose-optimization program which includes automated exposure control, adjustment of the mA and/or kV according to patient size and/or use of  iterative reconstruction technique. COMPARISON:  CT report 05/05/2019 FINDINGS: CT HEAD FINDINGS Brain: No acute territorial infarction, hemorrhage or intracranial mass. Hypodensity within the periventricular white matter some of which is likely due to chronic small vessel  ischemic change though suspect a component of transependymal CSF extravasation. Moderate enlargement of the lateral third and fourth ventricles without significant atrophy Vascular: No hyperdense vessels.  Carotid vascular calcification Skull: Normal. Negative for fracture or focal lesion. Other: Large right forehead and periorbital hematoma CT MAXILLOFACIAL FINDINGS Osseous: Motion degradation limits the exam. Mastoid air cells are clear. Mandibular heads are normally position. No mandibular fracture. Pterygoid plates and zygomatic arches are intact. No acute nasal bone fracture Orbits: Negative. No traumatic or inflammatory finding. Sinuses: Clear. Soft tissues: Moderate right periorbital and forehead hematoma CT CERVICAL SPINE FINDINGS Alignment: Mild reversal of cervical lordosis. Trace anterolisthesis C2 on C3, C3 on C4 and C4 on C5. Facet alignment within normal limits. Skull base and vertebrae: No acute fracture. No primary bone lesion or focal pathologic process. Soft tissues and spinal canal: No prevertebral fluid or swelling. No visible canal hematoma. Disc levels: Multilevel degenerative change with advanced disc space narrowing C5-C6 and C6-C7. Facet degenerative changes at multiple levels with foraminal stenosis Upper chest: Apical emphysema Other: None IMPRESSION: 1. No CT evidence for acute intracranial abnormality. 2. Moderate enlargement of the ventricles without significant cortical atrophy suggesting normal pressure hydrocephalus. Periventricular white matter hypodensity some of which is felt secondary to chronic small vessel ischemic change though suspect that there is a component of transependymal CSF extravasation. 3. No  definite acute facial bone fracture allowing for motion. Moderate to large right periorbital and forehead hematoma 4. Mild reversal of cervical lordosis with degenerative changes. No definite acute fracture 5. Apical emphysema Electronically Signed   By: Donavan Foil M.D.   On: 07/10/2021 18:12   CT Angio Chest PE W and/or Wo Contrast  Result Date: 07/10/2021 CLINICAL DATA:  Positive D-dimer, hypoxia EXAM: CT ANGIOGRAPHY CHEST WITH CONTRAST TECHNIQUE: Multidetector CT imaging of the chest was performed using the standard protocol during bolus administration of intravenous contrast. Multiplanar CT image reconstructions and MIPs were obtained to evaluate the vascular anatomy. RADIATION DOSE REDUCTION: This exam was performed according to the departmental dose-optimization program which includes automated exposure control, adjustment of the mA and/or kV according to patient size and/or use of iterative reconstruction technique. CONTRAST:  25mL OMNIPAQUE IOHEXOL 350 MG/ML SOLN COMPARISON:  04/07/2011 FINDINGS: Cardiovascular: There is homogeneous enhancement in thoracic aorta. There is ectasia of ascending thoracic aorta measuring 4.6 cm. Coronary artery calcifications are seen. There are no intraluminal filling defects in the pulmonary artery branches. There is ectasia of main pulmonary artery measuring 3.6 cm. Mediastinum/Nodes: No significant lymphadenopathy is seen. Lungs/Pleura: Right hemidiaphragm is elevated. There are linear densities in the right lower lung fields. Subtle increased interstitial markings are seen in the right parahilar region. There are multiple blebs and bullae in the periphery of both lungs. There is no focal pulmonary consolidation. There is no pleural effusion or pneumothorax. Upper Abdomen: There is fatty infiltration in the liver. Calcified granulomas are seen in the spleen. Musculoskeletal: Unremarkable. Review of the MIP images confirms the above findings. IMPRESSION: There is no  evidence of pulmonary artery embolism. There is ectasia of main pulmonary artery suggesting pulmonary arterial hypertension. There is no evidence of thoracic aortic dissection. There is ectasia of ascending thoracic aorta. Coronary artery calcifications are seen. Elevation of right hemidiaphragm may be due to eventration or paralysis. Linear densities in the right lower lung fields suggest scarring or subsegmental atelectasis. Subtle increase in interstitial markings in the right parahilar region may suggest scarring. Less likely possibility would be interstitial pneumonia. Blebs and bullae are  seen in the periphery of both lungs. Fatty liver. Electronically Signed   By: Elmer Picker M.D.   On: 07/10/2021 20:17   CT Cervical Spine Wo Contrast  Result Date: 07/10/2021 CLINICAL DATA:  Fall and hit right side with hematoma to eyebrow EXAM: CT HEAD WITHOUT CONTRAST CT MAXILLOFACIAL WITHOUT CONTRAST CT CERVICAL SPINE WITHOUT CONTRAST TECHNIQUE: Multidetector CT imaging of the head, cervical spine, and maxillofacial structures were performed using the standard protocol without intravenous contrast. Multiplanar CT image reconstructions of the cervical spine and maxillofacial structures were also generated. RADIATION DOSE REDUCTION: This exam was performed according to the departmental dose-optimization program which includes automated exposure control, adjustment of the mA and/or kV according to patient size and/or use of iterative reconstruction technique. COMPARISON:  CT report 05/05/2019 FINDINGS: CT HEAD FINDINGS Brain: No acute territorial infarction, hemorrhage or intracranial mass. Hypodensity within the periventricular white matter some of which is likely due to chronic small vessel ischemic change though suspect a component of transependymal CSF extravasation. Moderate enlargement of the lateral third and fourth ventricles without significant atrophy Vascular: No hyperdense vessels.  Carotid vascular  calcification Skull: Normal. Negative for fracture or focal lesion. Other: Large right forehead and periorbital hematoma CT MAXILLOFACIAL FINDINGS Osseous: Motion degradation limits the exam. Mastoid air cells are clear. Mandibular heads are normally position. No mandibular fracture. Pterygoid plates and zygomatic arches are intact. No acute nasal bone fracture Orbits: Negative. No traumatic or inflammatory finding. Sinuses: Clear. Soft tissues: Moderate right periorbital and forehead hematoma CT CERVICAL SPINE FINDINGS Alignment: Mild reversal of cervical lordosis. Trace anterolisthesis C2 on C3, C3 on C4 and C4 on C5. Facet alignment within normal limits. Skull base and vertebrae: No acute fracture. No primary bone lesion or focal pathologic process. Soft tissues and spinal canal: No prevertebral fluid or swelling. No visible canal hematoma. Disc levels: Multilevel degenerative change with advanced disc space narrowing C5-C6 and C6-C7. Facet degenerative changes at multiple levels with foraminal stenosis Upper chest: Apical emphysema Other: None IMPRESSION: 1. No CT evidence for acute intracranial abnormality. 2. Moderate enlargement of the ventricles without significant cortical atrophy suggesting normal pressure hydrocephalus. Periventricular white matter hypodensity some of which is felt secondary to chronic small vessel ischemic change though suspect that there is a component of transependymal CSF extravasation. 3. No definite acute facial bone fracture allowing for motion. Moderate to large right periorbital and forehead hematoma 4. Mild reversal of cervical lordosis with degenerative changes. No definite acute fracture 5. Apical emphysema Electronically Signed   By: Donavan Foil M.D.   On: 07/10/2021 18:12   DG Hand Complete Right  Result Date: 07/10/2021 CLINICAL DATA:  Fall with skin tear EXAM: RIGHT HAND - COMPLETE 3+ VIEW COMPARISON:  None. FINDINGS: No fracture or malalignment. Advanced arthritis  involving the first [facility] joint, STT interval and first and second MCP joints. Advanced arthritis involving the D IP joints with lesser involvement of PIP joints. Chondrocalcinosis. IMPRESSION: Arthritis.  No acute osseous abnormality Electronically Signed   By: Donavan Foil M.D.   On: 07/10/2021 18:53   CT Maxillofacial Wo Contrast  Result Date: 07/10/2021 CLINICAL DATA:  Fall and hit right side with hematoma to eyebrow EXAM: CT HEAD WITHOUT CONTRAST CT MAXILLOFACIAL WITHOUT CONTRAST CT CERVICAL SPINE WITHOUT CONTRAST TECHNIQUE: Multidetector CT imaging of the head, cervical spine, and maxillofacial structures were performed using the standard protocol without intravenous contrast. Multiplanar CT image reconstructions of the cervical spine and maxillofacial structures were also generated. RADIATION DOSE REDUCTION: This  exam was performed according to the departmental dose-optimization program which includes automated exposure control, adjustment of the mA and/or kV according to patient size and/or use of iterative reconstruction technique. COMPARISON:  CT report 05/05/2019 FINDINGS: CT HEAD FINDINGS Brain: No acute territorial infarction, hemorrhage or intracranial mass. Hypodensity within the periventricular white matter some of which is likely due to chronic small vessel ischemic change though suspect a component of transependymal CSF extravasation. Moderate enlargement of the lateral third and fourth ventricles without significant atrophy Vascular: No hyperdense vessels.  Carotid vascular calcification Skull: Normal. Negative for fracture or focal lesion. Other: Large right forehead and periorbital hematoma CT MAXILLOFACIAL FINDINGS Osseous: Motion degradation limits the exam. Mastoid air cells are clear. Mandibular heads are normally position. No mandibular fracture. Pterygoid plates and zygomatic arches are intact. No acute nasal bone fracture Orbits: Negative. No traumatic or inflammatory finding. Sinuses:  Clear. Soft tissues: Moderate right periorbital and forehead hematoma CT CERVICAL SPINE FINDINGS Alignment: Mild reversal of cervical lordosis. Trace anterolisthesis C2 on C3, C3 on C4 and C4 on C5. Facet alignment within normal limits. Skull base and vertebrae: No acute fracture. No primary bone lesion or focal pathologic process. Soft tissues and spinal canal: No prevertebral fluid or swelling. No visible canal hematoma. Disc levels: Multilevel degenerative change with advanced disc space narrowing C5-C6 and C6-C7. Facet degenerative changes at multiple levels with foraminal stenosis Upper chest: Apical emphysema Other: None IMPRESSION: 1. No CT evidence for acute intracranial abnormality. 2. Moderate enlargement of the ventricles without significant cortical atrophy suggesting normal pressure hydrocephalus. Periventricular white matter hypodensity some of which is felt secondary to chronic small vessel ischemic change though suspect that there is a component of transependymal CSF extravasation. 3. No definite acute facial bone fracture allowing for motion. Moderate to large right periorbital and forehead hematoma 4. Mild reversal of cervical lordosis with degenerative changes. No definite acute fracture 5. Apical emphysema Electronically Signed   By: Donavan Foil M.D.   On: 07/10/2021 18:12     Assessment/Plan   1. Acute hypoxic respiratory failure; acute on chronic diastolic CHF  - Pt presents after a slip and fall and found to have oxygen saturation low 80s with minimal exertion; he was also said to be hypoxic in PCP office today  - BNP is elevated and he has significant peripheral edema  - Diurese with 40 mg IV Lasix q12h, update echocardiogram, monitor wt and I/Os, monitor renal function and electrolytes    2. Right leg swelling; elevated d-dimer  - Patient reports chronic Rt > Lt leg swelling but has had significant worsening recently, was started on doxycycline for RLE cellulitis, and had  d-dimer 2.2 with PCP today  - Continue doxycycline, check venous US   3. Renal insufficiency; hyponatremia  - SCr is 1.56 on admission, up from 0.97 in June 2021; serum sodium is 128 on admission   - He is hypervolemic on admission, plan to follow renal function and electrolytes closely while diuresing    4. Hypertension  - Continue Norvasc, clonidine, metoprolol   5. Bullous pemphigoid  - Stable, managed with methotrexate   6. Asthma  - No cough or wheezing on admission  - Continue Singulair and as-needed albuterol    DVT prophylaxis: sq heparin  Code Status: DNR, discussed with patient in ED  Level of Care: Level of care: Telemetry Family Communication: Friend updated at bedside  Disposition Plan Patient is from: home  Anticipated d/c is to: home  Anticipated d/c date  is: Possibly as early as 2/24 or 07/12/21 Patient currently: Pending RLE venous US, echo, improved oxygenation, stable/improved renal function and sodium  Consults called: none  Admission status: Observation     Vianne Bulls, MD [facility]  07/11/2021, 4:43 AM

## 2021-07-11 NOTE — ED Notes (Signed)
Patient unable to sign MSE d/t not having glasses.  This RN signed.

## 2021-07-11 NOTE — Progress Notes (Signed)
RLE venous duplex has been completed.   Results can be found under chart review under CV PROC. 07/11/2021 1:07 PM Onnie Hatchel RVT, RDMS

## 2021-07-11 NOTE — ED Notes (Signed)
Patient took his personal meds he brought from home.

## 2021-07-11 NOTE — Progress Notes (Signed)
° °  Echocardiogram 2D Echocardiogram has been performed.  Beryle Beams 07/11/2021, 9:06 AM

## 2021-07-11 NOTE — Progress Notes (Signed)
Progress Note   Patient: [name] [id number] DOB: [birth year] DOA: 07/10/2021     0 DOS: the patient was seen and examined on 07/11/2021   Brief hospital course: 88 y.o. male with medical history significant for chronic diastolic CHF, hypertension, asthma, bullous pemphigoid, and BMI 40, now presenting to the emergency department after a slip and fall.  Patient saw his PCP today for increased right leg swelling and redness, was started on doxycycline, had blood work performed including D-dimer which is said to be elevated to 2.2.  Following the visit, he slipped in the rain while getting out of a car, falling onto his right side.  He denies any significant pain but was noted to have periorbital hematoma on the right, some skin tears, and bruising to his chest, prompting family to bring him into the ED.  He does not feel particularly short of breath but family reports that he was hypoxic in the PCP office.   Assessment and Plan: No notes have been filed under this hospital service. Service: Hospitalist  1. Acute hypoxic respiratory failure; acute on chronic diastolic CHF  - Pt presents after a slip and fall and found to have oxygen saturation low 80s with minimal exertion; he was also said to be hypoxic in PCP office today  - BNP is elevated and he has significant peripheral edema  -Presenting BNP 236 -2d echo performed, pending results - Pt is continued on IV lasix bid   2. Right leg swelling; elevated d-dimer  - Patient reports chronic Rt > Lt leg swelling but has had significant worsening recently, was started on doxycycline for RLE cellulitis, and had d-dimer 2.2 with PCP today  - Continue doxycycline -LE dopplers neg for DVT, reviewed   3. Renal insufficiency; hyponatremia  - SCr is 1.56 on admission, up from 0.97 in June 2021; serum sodium is 128 on admission   - He is hypervolemic on admission. Cont lasix as tolerated -Renal function and sodium improving with diuresis    4. Hypertension  - Continue Norvasc, clonidine, metoprolol    5. Bullous pemphigoid  - Stable, managed with methotrexate    6. Asthma  - No cough or wheezing on admission  - Continue Singulair and as-needed albuterol   7. Hyponatremia - Improved with diuresis      Subjective: Reports feeling better today  Physical Exam: Vitals:   07/11/21 0400 07/11/21 0600 07/11/21 0900 07/11/21 1200  BP: (!) 147/69 (!) 122/56 136/62 (!) 153/68  Pulse: 69 73 68 (!) 146  Resp:  18 18 18   Temp:      TempSrc:      SpO2: 90% 91% 93% 93%  Weight:      Height:       General exam: Awake, laying in bed, in nad Respiratory system: Normal respiratory effort, no wheezing Cardiovascular system: regular rate, s1, s2 Gastrointestinal system: Soft, nondistended, positive BS Central nervous system: CN2-12 grossly intact, strength intact Extremities: Perfused, no clubbing, BLE edema Skin: Normal skin turgor, no notable skin lesions seen Psychiatry: Mood normal // no visual hallucinations   Data Reviewed:  LE dopplers reviewed, no LLE DVT  Family Communication: Pt in room, family not at bedside  Disposition: Status is: Observation The patient will require care spanning > 2 midnights and should be moved to inpatient because: Severity of illness    Planned Discharge Destination: Home      Author: Marylu Lund, MD 07/11/2021 3:05 PM  For on call review [email or web address].

## 2021-07-12 DIAGNOSIS — L120 Bullous pemphigoid: Secondary | ICD-10-CM | POA: Diagnosis not present

## 2021-07-12 DIAGNOSIS — J9601 Acute respiratory failure with hypoxia: Secondary | ICD-10-CM | POA: Diagnosis not present

## 2021-07-12 DIAGNOSIS — I5033 Acute on chronic diastolic (congestive) heart failure: Secondary | ICD-10-CM | POA: Diagnosis not present

## 2021-07-12 LAB — BASIC METABOLIC PANEL
Anion gap: 8 (ref 5–15)
BUN: 22 mg/dL (ref 8–23)
CO2: 30 mmol/L (ref 22–32)
Calcium: 9 mg/dL (ref 8.9–10.3)
Chloride: 94 mmol/L — ABNORMAL LOW (ref 98–111)
Creatinine, Ser: 1.13 mg/dL (ref 0.61–1.24)
GFR, Estimated: >60 mL/min (ref >60)
Glucose, Bld: 99 mg/dL (ref 70–99)
Potassium: 4 mmol/L (ref 3.5–5.1)
Sodium: 132 mmol/L — ABNORMAL LOW (ref 135–145)

## 2021-07-12 MED ORDER — ONDANSETRON HCL 4 MG/2ML IJ SOLN
4.0000 mg | Freq: Four times a day (QID) | INTRAMUSCULAR | Status: DC | PRN
  Administered 2021-07-12 – 2021-07-15 (×4): 4 mg via INTRAVENOUS
  Filled 2021-07-12 (×4): qty 2

## 2021-07-12 NOTE — Evaluation (Signed)
Physical Therapy Evaluation Patient Details Name: [name] MRN: [id number] DOB: [birth year] Today's Date: 07/12/2021  History of Present Illness  Pt is 88 yo male admitted on 07/10/21 after fall in rain on R side, pt found to have acute resp failure due to acute on chronic CHF and R LE cellulitis. PMH includes but not limited to chronic diastolic CHF, hypertension, asthma, bullous pemphigoid, Bil TKA, Bil THA, L TSA, and BMI 40  Clinical Impression  Pt admitted with above diagnosis. At baseline, pt reports independent with ADLs and ambulation with rollator.  He reports fall was unusual accident b/c of the rain but friends report pt getting weaker over the past several weeks.  Pt also reported he would have assist for several days but friends state they could only come on weekends as they work in [place]. Today, pt requiring min-mod A for transfers with tendency to lean posteriorly.  He ambulated 85' but fatigued easily requiring 2 standing rest break.  Pt does have some decreased insight into deficits/safety as he just wants to get home and reports "i'll do better there."   Pt does have good potential to progress but do recommend SNF at this time due to lack of support at home and fall risk.   Pt currently with functional limitations due to the deficits listed below (see PT Problem List). Pt will benefit from skilled PT to increase their independence and safety with mobility to allow discharge to the venue listed below.          Recommendations for follow up therapy are one component of a multi-disciplinary discharge planning process, led by the attending physician.  Recommendations may be updated based on patient status, additional functional criteria and insurance authorization.  Follow Up Recommendations Skilled nursing-short term rehab (<3 hours/day)    Assistance Recommended at Discharge Frequent or constant Supervision/Assistance  Patient can return home with the following  A lot of  help with walking and/or transfers;A little help with bathing/dressing/bathroom;Assistance with cooking/housework    Equipment Recommendations None recommended by PT  Recommendations for Other Services       Functional Status Assessment Patient has had a recent decline in their functional status and demonstrates the ability to make significant improvements in function in a reasonable and predictable amount of time.     Precautions / Restrictions Precautions Precautions: Fall      Mobility  Bed Mobility Overal bed mobility: Needs Assistance             General bed mobility comments: pt at EOB upon arrival    Transfers Overall transfer level: Needs assistance Equipment used: Rolling walker (2 wheels), Rollator (4 wheels) Transfers: Sit to/from Stand Sit to Stand: Min assist, Mod assist, From elevated surface           General transfer comment: With RW requiring mod A to rise and steady with tendency to posterior lean; with rollator min A to rise    Ambulation/Gait Ambulation/Gait assistance: Min assist, Mod assist Gait Distance (Feet): 60 Feet Assistive device: Rollator (4 wheels), Rolling walker (2 wheels) Gait Pattern/deviations: Step-to pattern, Decreased stride length, Trunk flexed Gait velocity: decreased     General Gait Details: Initially attempted a few steps with RW but pt with posterior lean requiring mod A and then back to bed to sit b/c still going posterior.  He typically uses rolator.  Switched to rollator and min A to steady.  Ambulated 40' but with 2 standing rest breaks, easily fatigued, and slow gait.  Stairs            Wheelchair Mobility    Modified Rankin (Stroke Patients Only)       Balance Overall balance assessment: Needs assistance Sitting-balance support: No upper extremity supported Sitting balance-Leahy Scale: Good     Standing balance support: Bilateral upper extremity supported Standing balance-Leahy Scale:  Poor Standing balance comment: Requiring UE support; with RW requiring mod A and posterior lean; with rollator min guard- min A                             Pertinent Vitals/Pain Pain Assessment Pain Assessment: No/denies pain    Home Living Family/patient expects to be discharged to:: Private residence Living Arrangements: Alone Available Help at Discharge: Family;Available PRN/intermittently (Pt initially reporting friends could assist for a few days.  Friends arrived and reports they would only be able to stay tomorrow then back to work in [place] on Monday.  Pt states he has good neighbors to help, freinds shake heads "no") Type of Home: House Home Access: Stairs to enter   [employer] of Steps: 1   Home Layout: One level Home Equipment: Air cabin crew (4 wheels);Cane - single point      Prior Function Prior Level of Function : Independent/Modified Independent;Driving             Mobility Comments: ambulates with cane or RW for ambulation ; pt reports moving around well and fall was accident due to rain - friends arrived and report pt has been getting weak and having difficulty moving past 2 weeks       Hand Dominance        Extremity/Trunk Assessment   Upper Extremity Assessment Upper Extremity Assessment: LUE deficits/detail;RUE deficits/detail RUE Deficits / Details: Limited shoulder ROM - reports needs TSA; MMT 4/5 LUE Deficits / Details: ROM WFL; MMT 4/5    Lower Extremity Assessment Lower Extremity Assessment: LLE deficits/detail;RLE deficits/detail RLE Deficits / Details: ROM WFL; MMT 4/5 LLE Deficits / Details: ROM WFL; MMT 4/5    Cervical / Trunk Assessment Cervical / Trunk Assessment: Kyphotic  Communication   Communication: No difficulties  Cognition Arousal/Alertness: Awake/alert Behavior During Therapy: WFL for tasks assessed/performed Overall Cognitive Status: Within Functional Limits for tasks assessed Area of  Impairment: Safety/judgement                         Safety/Judgement: Decreased awareness of safety, Decreased awareness of deficits     General Comments: Pt wanting to go home leading to decreased insight/trying to justify his weaknessess and that he'll be ok at home        General Comments General comments (skin integrity, edema, etc.): VSS    Exercises     Assessment/Plan    PT Assessment Patient needs continued PT services  PT Problem List Decreased strength;Decreased mobility;Decreased safety awareness;Decreased range of motion;Decreased coordination;Decreased activity tolerance;Cardiopulmonary status limiting activity;Decreased balance;Decreased knowledge of use of DME       PT Treatment Interventions DME instruction;Therapeutic activities;Gait training;Therapeutic exercise;Patient/family education;Stair training;Balance training;Functional mobility training    PT Goals (Current goals can be found in the Care Plan section)  Acute Rehab PT Goals Patient Stated Goal: return home PT Goal Formulation: With patient/family Time For Goal Achievement: 07/26/21 Potential to Achieve Goals: Good    Frequency Min 3X/week     Co-evaluation  AM-PAC PT "6 Clicks" Mobility  Outcome Measure Help needed turning from your back to your side while in a flat bed without using bedrails?: A Little Help needed moving from lying on your back to sitting on the side of a flat bed without using bedrails?: A Little Help needed moving to and from a bed to a chair (including a wheelchair)?: A Lot Help needed standing up from a chair using your arms (e.g., wheelchair or bedside chair)?: A Lot Help needed to walk in hospital room?: A Lot Help needed climbing 3-5 steps with a railing? : A Lot 6 Click Score: 14    End of Session Equipment Utilized During Treatment: Gait belt Activity Tolerance: Patient tolerated treatment well Patient left: in chair;with call  bell/phone within reach;with family/visitor present (no chair alarms on floor, friends presnt) Nurse Communication: Mobility status PT Visit Diagnosis: Other abnormalities of gait and mobility (R26.89);Muscle weakness (generalized) (M62.81)    Time: 0998-3382 PT Time Calculation (min) (ACUTE ONLY): 41 min   Charges:   PT Evaluation $PT Eval Moderate Complexity: 1 Mod PT Treatments $Gait Training: 8-22 mins $Therapeutic Activity: 8-22 mins        Abran Richard, PT Acute Rehab Services Pager [phone] [facility] [place] 07/12/2021, 11:23 AM

## 2021-07-12 NOTE — Progress Notes (Signed)
Vaseline Gauze, Abd pad and kerlix applied to right lower leg and right hand. Wound ostomy consult ordered.

## 2021-07-12 NOTE — TOC Progression Note (Signed)
Transition of Care [facility]) - Progression Note    Patient Details  Name: LORENZ DONLEY MRN: [id number] Date of Birth: 20-May-1932  [facility]) CM/SW Contact  Aniyia Rane, Marta Lamas, [place] Phone Number: 07/12/2021, 3:07 PM  Clinical Narrative:     Several attempts made to try and converse with patient, as well as Healthcare Power of [place], without success.  PT is recommending SNF, but patient may only be agreeable to home with home health services.  Would need HHPT, OT, & RN for wound care and ostomy changes versus SNF.  TOC CM/SW will continue to follow.  Expected Discharge Plan and [place] with home health services versus SNF.  Social Determinants of Health (SDOH) Interventions   N/A  Readmission Risk Interventions No flowsheet data found.

## 2021-07-12 NOTE — Progress Notes (Signed)
Pt. Needs a wound/ostomy consult for large skin tear (present on admission) on R lower leg and R hand.

## 2021-07-12 NOTE — Progress Notes (Signed)
Progress Note   Patient: [name] [id number] DOB: 27-Sep-1932 DOA: 07/10/2021     1 DOS: the patient was seen and examined on 07/12/2021   Brief hospital course: 88 y.o. male with medical history significant for chronic diastolic CHF, hypertension, asthma, bullous pemphigoid, and BMI 40, now presenting to the emergency department after a slip and fall.  Patient saw his PCP today for increased right leg swelling and redness, was started on doxycycline, had blood work performed including D-dimer which is said to be elevated to 2.2.  Following the visit, he slipped in the rain while getting out of a car, falling onto his right side.  He denies any significant pain but was noted to have periorbital hematoma on the right, some skin tears, and bruising to his chest, prompting family to bring him into the ED.  He does not feel particularly short of breath but family reports that he was hypoxic in the PCP office.   Assessment and Plan: No notes have been filed under this hospital service. Service: Hospitalist  1. Acute hypoxic respiratory failure; acute on chronic diastolic CHF  - Pt presents after a slip and fall and found to have oxygen saturation low 80s with minimal exertion; he was also said to be hypoxic in PCP office - BNP is elevated and he has significant peripheral edema  -Presenting BNP 236 -2d echo reviewed, findings of EF 60-65% with grade 2 diastolic dysfunction - Good urine output thus far with IV lasix, although LE still appears edematous -Recommend continued IV lasix for now -Overall improving -Repeat bmet in AM   2. Right leg swelling; elevated d-dimer  - Patient reports chronic Rt > Lt leg swelling but has had significant worsening recently, was started on doxycycline for RLE cellulitis, and had d-dimer 2.2 with PCP today  - Continue doxycycline -LE dopplers neg for DVT, reviewed   3. Renal insufficiency; hyponatremia  - SCr is 1.56 on admission, up from 0.97 in June  2021; serum sodium is 128 on admission   - He is hypervolemic on admission. Cont lasix as tolerated -Renal function and sodium improving with diuresis   4. Hypertension  - Continue Norvasc, clonidine, metoprolol    5. Bullous pemphigoid  - Stable, managed with methotrexate    6. Asthma  - No cough or wheezing on admission  - Continue Singulair and as-needed albuterol   7. Hyponatremia - Improved with diuresis      Subjective: States feeling better today from fluid standpoint. Is feeling depressed about being in hospital  Physical Exam: Vitals:   07/12/21 0249 07/12/21 0742 07/12/21 1007 07/12/21 1307  BP: (!) 146/66 (!) 130/51  103/67  Pulse: 83 75  79  Resp: 18 18  18   Temp: 98.1 F (36.7 C) 98.4 F (36.9 C)  97.9 F (36.6 C)  TempSrc: Oral Oral  Oral  SpO2: 93% 97% 91% 98%  Weight: 118.8 kg 118.8 kg    Height:  5\' 11"  (1.803 m)     General exam: Conversant, in no acute distress Respiratory system: normal chest rise, clear, no audible wheezing Cardiovascular system: regular rhythm, s1-s2 Gastrointestinal system: Nondistended, nontender, pos BS Central nervous system: No seizures, no tremors Extremities: No cyanosis, no joint deformities Skin: No rashes, no pallor Psychiatry: Affect normal // no auditory hallucinations   Data Reviewed:  LE dopplers reviewed, no LLE DVT  Family Communication: Pt in room, family not at bedside  Disposition: Status is: Inpatient Requires continued inpatient stay because: Severity  of illness requiring IV lasix    Planned Discharge Destination: Home     Author: Marylu Lund, MD 07/12/2021 5:10 PM  For on call review [email or web address].

## 2021-07-13 DIAGNOSIS — N179 Acute kidney failure, unspecified: Secondary | ICD-10-CM | POA: Diagnosis not present

## 2021-07-13 DIAGNOSIS — J9601 Acute respiratory failure with hypoxia: Secondary | ICD-10-CM | POA: Diagnosis not present

## 2021-07-13 DIAGNOSIS — L120 Bullous pemphigoid: Secondary | ICD-10-CM | POA: Diagnosis not present

## 2021-07-13 LAB — BASIC METABOLIC PANEL
Anion gap: 7 (ref 5–15)
BUN: 26 mg/dL — ABNORMAL HIGH (ref 8–23)
CO2: 33 mmol/L — ABNORMAL HIGH (ref 22–32)
Calcium: 8.9 mg/dL (ref 8.9–10.3)
Chloride: 92 mmol/L — ABNORMAL LOW (ref 98–111)
Creatinine, Ser: 1.18 mg/dL (ref 0.61–1.24)
GFR, Estimated: >60 mL/min (ref >60)
Glucose, Bld: 107 mg/dL — ABNORMAL HIGH (ref 70–99)
Potassium: 3.9 mmol/L (ref 3.5–5.1)
Sodium: 132 mmol/L — ABNORMAL LOW (ref 135–145)

## 2021-07-13 MED ORDER — HYDROXYZINE HCL 10 MG PO TABS
10.0000 mg | ORAL_TABLET | Freq: Three times a day (TID) | ORAL | Status: DC | PRN
  Administered 2021-07-13 – 2021-07-15 (×5): 10 mg via ORAL
  Filled 2021-07-13 (×7): qty 1

## 2021-07-13 NOTE — Progress Notes (Addendum)
Progress Note   Patient: [name] [id number]:[id number] DOB: 1932-11-19 DOA: 07/10/2021     2 DOS: the patient was seen and examined on 07/13/2021   Brief hospital course: 88 y.o. male with medical history significant for chronic diastolic CHF, hypertension, asthma, bullous pemphigoid, and BMI 40, now presenting to the emergency department after a slip and fall.  Patient saw his PCP today for increased right leg swelling and redness, was started on doxycycline, had blood work performed including D-dimer which is said to be elevated to 2.2.  Following the visit, he slipped in the rain while getting out of a car, falling onto his right side.  He denies any significant pain but was noted to have periorbital hematoma on the right, some skin tears, and bruising to his chest, prompting family to bring him into the ED.  He does not feel particularly short of breath but family reports that he was hypoxic in the PCP office.   Assessment and Plan: No notes have been filed under this hospital service. Service: Hospitalist  1. Acute hypoxic respiratory failure; acute on chronic diastolic CHF  - Pt presents after a slip and fall and found to have oxygen saturation low 80s with minimal exertion; he was also said to be hypoxic in PCP office - BNP is elevated and he has significant peripheral edema  -Presenting BNP 236 -2d echo reviewed, findings of EF 60-65% with grade 2 diastolic dysfunction -Cont with good urine output with clinical improvement -Cont IV lasix for now -Repeat bmet in AM   2. Right leg swelling; elevated d-dimer  - Patient reports chronic Rt > Lt leg swelling but has had significant worsening recently, was started on doxycycline for RLE cellulitis, and had d-dimer 2.2 with PCP today  - Continue doxycycline -LE dopplers neg for DVT, reviewed   3. Renal insufficiency; hyponatremia  - SCr is 1.56 on admission, up from 0.97 in June 2021; serum sodium is 128 on admission   - He is  hypervolemic on admission. Cont lasix as tolerated -Renal function and sodium improving with diuresis   4. Hypertension  - Continue Norvasc, clonidine, metoprolol    5. Bullous pemphigoid  - Stable, managed with methotrexate    6. Asthma  - No cough or wheezing on admission  - Continue Singulair and as-needed albuterol   7. Hyponatremia - Improved with diuresis -Repeat bmet in AM      Subjective: Reports feeling better. Still weak  Physical Exam: Vitals:   07/13/21 0419 07/13/21 0800 07/13/21 1300 07/13/21 1423  BP: 112/62  127/75 127/75  Pulse: 75  72 72  Resp: 16     Temp: 98.2 F (36.8 C)     TempSrc: Oral     SpO2: 97%     Weight:  118.8 kg    Height:       General exam: Awake, laying in bed, in nad Respiratory system: Normal respiratory effort, no wheezing Cardiovascular system: regular rate, s1, s2 Gastrointestinal system: Soft, nondistended, positive BS Central nervous system: CN2-12 grossly intact, strength intact Extremities: Perfused, no clubbing Skin: Normal skin turgor, no notable skin lesions seen Psychiatry: Mood normal // no visual hallucinations   Data Reviewed:  LE dopplers reviewed, no LLE DVT  Family Communication: Pt in room, family not at bedside  Disposition: Status is: Inpatient Requires continued inpatient stay because: Severity of illness requiring IV lasix    Planned Discharge Destination: Skilled nursing facility     Author: Marylu Lund, MD 07/13/2021  6:03 PM  For on call review [email or web address].

## 2021-07-13 NOTE — Consult Note (Signed)
WOC Nurse Consult Note: Reason for Consult: full thickness skin tears, right LE > left LE, also right hand Wound type: trauma (s/p fall outside) Pressure Injury POA:N/A Measurement: Right LE: 7cm x 4cm x 0.2cm with avulsed area, red, dry wound bed Left LE with 5cm x 2cm x 0.1cm dry red wound bed Right hand with 3cm x 2cm x 0.1cm red, dry wound bed Other, less severe areas of skin abrasion on right LE  Wound bed:As noted above Drainage (amount, consistency, odor) small serosanguinous from right hand, small yellow from left LE and small serosanguinous from RLE Periwound: erythema, edema Dressing procedure/placement/frequency: I have provided Nursing with guidance for topical care using an antimicrobial nonadherent (xeroform) as a wound contact layer topped with ABD pad on RLE and dry gauze on LLE and hand.  The RLE is to be secured with a fwe turns of Kerlix roll gauze/paper tape and the hand and LLE are to be secured with silicone foam. Turning and repositioning are in place, but I have requested placement of a silicone foam to the sacrum for PI prevention.  POC discussed with family in room, patient and bedside RN, Sami. [place] nursing team will not follow, but will remain available to this patient, the nursing and medical teams.  Please re-consult if needed. Thanks, Maudie Flakes, MSN, RN, [place], Arther Abbott  Pager# [phone]

## 2021-07-13 NOTE — TOC Progression Note (Signed)
Transition of Care [facility] - Progression Note    Patient Details  Name: [name] MRN: [id number] Date of Birth: 1933/04/28  Transition of Care [facility]) CM/SW [place], LCSW Phone Number: 07/13/2021, 1:21 PM  Clinical Narrative:     CSW met with pt to discuss PT recommendations. CSW explained SNF vs home with Home Health. Pt is [facility] with [facility] and RN. Pt agreed with PT recommendations for skilled nursing. Pt stated he has never been placed before. Pt does not have a preference on facility. Pt inquired if he would hear back today on what facility he can d/c to . CSW informed pt he will have to wait until a facility offers him a bed. CSW to work pt up for SNF.       Expected Discharge Plan and Services                                                 Social Determinants of Health (SDOH) Interventions    Readmission Risk Interventions No flowsheet data found.

## 2021-07-13 NOTE — NC FL2 (Signed)
[place] LEVEL OF CARE SCREENING TOOL     IDENTIFICATION  Patient Name: [name] Birthdate: 12-09-1932 Sex: male Admission Date (Current Location): 07/10/2021  [facility] and [place] Number:  Herbalist and Address:  [facility],  [place], [place]      Provider Number: [id number]  Attending Physician Name and Address:  Donne Hazel, MD  Relative Name and Phone Number:       Current Level of Care: Hospital Recommended Level of Care: [place] Prior Approval Number:    Date Approved/Denied:   PASRR Number: [id number]  Discharge Plan: SNF    Current Diagnoses: Patient Active Problem List   Diagnosis Date Noted   Renal insufficiency 07/11/2021   Acute on chronic diastolic CHF (congestive heart failure) ([place]) 07/11/2021   CHF exacerbation ([place]) 07/11/2021   Acute respiratory failure with hypoxia ([place]) 07/10/2021   Cellulitis of right leg 10/15/2019   Hyperlipidemia 10/15/2019   ARF (acute renal failure) ([place]) 10/15/2019   Bullous pemphigoid 10/15/2019   Cellulitis and abscess of toe of right foot 07/25/2019   Foot osteomyelitis, right ([place]) 06/22/2019   Left leg cellulitis 12/31/2015   Edema of extremities 07/06/2013   PVC's (premature ventricular contractions)    Degenerative arthritis of hip 10/14/2012   Postoperative anemia due to acute blood loss 06/04/2011   Asthma, chronic 06/04/2011   Hyponatremia 06/04/2011   Tachycardia 04/07/2011   Hypertension 04/07/2011   BPH (benign prostatic hyperplasia) 04/07/2011   DJD (degenerative joint disease) 04/07/2011    Orientation RESPIRATION BLADDER Height & Weight     Self, Time, Situation, Place  Normal Continent Weight: 261 lb 14.5 oz (118.8 kg) Height:  5\' 11"  (180.3 cm)  BEHAVIORAL SYMPTOMS/MOOD NEUROLOGICAL BOWEL NUTRITION STATUS      Continent Diet (regular)  AMBULATORY STATUS COMMUNICATION OF NEEDS Skin   Limited Assist Verbally  Normal                       Personal Care Assistance Level of Assistance  Bathing, Feeding, Dressing Bathing Assistance: Limited assistance Feeding assistance: Independent Dressing Assistance: Limited assistance     Functional Limitations Info  Sight, Hearing, Speech Sight Info: Adequate   Speech Info: Adequate    SPECIAL CARE FACTORS FREQUENCY  PT (By licensed PT), OT (By licensed OT)     PT Frequency: 5 x a week OT Frequency: 5 x a week            Contractures Contractures Info: Not present    Additional Factors Info  Code Status, Allergies Code Status Info: DNR Allergies Info: Spironolactone,Hydrocodone           Current Medications (07/13/2021):  This is the current hospital active medication list Current Facility-Administered Medications  Medication Dose Route Frequency Provider Last Rate Last Admin   acetaminophen (TYLENOL) tablet 650 mg  650 mg Oral Q6H PRN Opyd, Ilene Qua, MD   650 mg at 07/13/21 2778   Or   acetaminophen (TYLENOL) suppository 650 mg  650 mg Rectal Q6H PRN Opyd, Ilene Qua, MD       albuterol (PROVENTIL) (2.5 MG/3ML) 0.083% nebulizer solution 3 mL  3 mL Inhalation Q4H PRN Opyd, Ilene Qua, MD       amLODipine (NORVASC) tablet 2.5 mg  2.5 mg Oral Daily Opyd, Ilene Qua, MD   2.5 mg at 07/13/21 0854   calcium carbonate (TUMS - dosed in mg elemental calcium) chewable tablet  200 mg of elemental calcium  1 tablet Oral TID PRN Donne Hazel, MD   200 mg of elemental calcium at 07/11/21 1627   cloNIDine (CATAPRES) tablet 0.2 mg  0.2 mg Oral BID Opyd, Ilene Qua, MD   0.2 mg at 07/13/21 0854   doxycycline (VIBRA-TABS) tablet 100 mg  100 mg Oral Q12H Opyd, Ilene Qua, MD   100 mg at 96/04/54 0981   folic acid (FOLVITE) tablet 1 mg  1 mg Oral Daily Opyd, Ilene Qua, MD   1 mg at 07/13/21 0854   furosemide (LASIX) injection 40 mg  40 mg Intravenous Q12H Opyd, Ilene Qua, MD   40 mg at 07/13/21 0412   heparin injection 5,000 Units  5,000 Units  Subcutaneous Q8H Opyd, Ilene Qua, MD   5,000 Units at 07/13/21 1344   hydrOXYzine (ATARAX) tablet 10 mg  10 mg Oral TID PRN Donne Hazel, MD   10 mg at 07/13/21 0854   metoprolol succinate (TOPROL-XL) 24 hr tablet 150 mg  150 mg Oral Daily Opyd, Ilene Qua, MD   150 mg at 07/13/21 0854   montelukast (SINGULAIR) tablet 10 mg  10 mg Oral Daily Opyd, Ilene Qua, MD   10 mg at 07/13/21 0854   ondansetron (ZOFRAN) injection 4 mg  4 mg Intravenous Q6H PRN Donne Hazel, MD   4 mg at 07/12/21 1420   pantoprazole (PROTONIX) EC tablet 40 mg  40 mg Oral Daily Donne Hazel, MD   40 mg at 07/13/21 0854   senna-docusate (Senokot-S) tablet 1 tablet  1 tablet Oral QHS PRN Opyd, Ilene Qua, MD       sodium chloride flush (NS) 0.9 % injection 3 mL  3 mL Intravenous Q12H Opyd, Ilene Qua, MD   3 mL at 07/12/21 2129     Discharge Medications: Please see discharge summary for a list of discharge medications.  Relevant Imaging Results:  Relevant Lab Results:   Additional Information SSN [id number] [place], LCSW

## 2021-07-13 NOTE — Progress Notes (Signed)
Physical Therapy Treatment Patient Details Name: [name] MRN: [id number] DOB: 09/03/1932 Today's Date: 07/13/2021   History of Present Illness Pt is 88 yo male admitted on 07/10/21 after fall in rain on R side, pt found to have acute resp failure due to acute on chronic CHF and R LE cellulitis. PMH includes but not limited to chronic diastolic CHF, hypertension, asthma, bullous pemphigoid, Bil TKA, Bil THA, L TSA, and BMI 40    PT Comments    Pt cooperative but requiring increased time and significant assist for safe performance of basic mobility tasks.  Pt limited this session to EOB sitting, standing and side stepping up side of bed and return to supine.  Pt with c/o mild dizziness with position changes (BP 127/75) and then reporting new onset subscapular pain 5/10.  RN alerted and requested hold further PT at this time pending her contact with physician and possible EKG.  Will follow.  Recommendations for follow up therapy are one component of a multi-disciplinary discharge planning process, led by the attending physician.  Recommendations may be updated based on patient status, additional functional criteria and insurance authorization.  Follow Up Recommendations  Skilled nursing-short term rehab (<3 hours/day)     Assistance Recommended at Discharge Frequent or constant Supervision/Assistance  Patient can return home with the following A lot of help with walking and/or transfers;A little help with bathing/dressing/bathroom;Assistance with cooking/housework   Equipment Recommendations  None recommended by PT    Recommendations for Other Services       Precautions / Restrictions Precautions Precautions: Fall Restrictions Weight Bearing Restrictions: No     Mobility  Bed Mobility Overal bed mobility: Needs Assistance Bed Mobility: Supine to Sit, Sit to Supine     Supine to sit: Min assist Sit to supine: Mod assist   General bed mobility comments: Increased time  with use of bed rail and assist to complete rotation to EOB sitting.  Increased assist to manage bil LEs on return to bed    Transfers Overall transfer level: Needs assistance Equipment used: Rolling walker (2 wheels), Rollator (4 wheels) Transfers: Sit to/from Stand Sit to Stand: Min assist, Mod assist, From elevated surface           General transfer comment: Assist to bring wt up and fwd.    Ambulation/Gait Ambulation/Gait assistance: Min assist, Min guard Gait Distance (Feet): 3 Feet Assistive device: Rollator (4 wheels) Gait Pattern/deviations: Step-to pattern, Decreased stride length, Trunk flexed Gait velocity: decreased     General Gait Details: side-step up side of bed only 2* pt c/o new subscapular pain   Stairs             Wheelchair Mobility    Modified Rankin (Stroke Patients Only)       Balance Overall balance assessment: Needs assistance Sitting-balance support: No upper extremity supported Sitting balance-Leahy Scale: Good     Standing balance support: Bilateral upper extremity supported Standing balance-Leahy Scale: Poor                              Cognition Arousal/Alertness: Awake/alert Behavior During Therapy: WFL for tasks assessed/performed Overall Cognitive Status: Within Functional Limits for tasks assessed Area of Impairment: Safety/judgement                         Safety/Judgement: Decreased awareness of safety, Decreased awareness of deficits     General Comments: Pt wanting  to go home leading to decreased insight/trying to justify his weaknessess and that he'll be ok at home        Exercises      General Comments        Pertinent Vitals/Pain Pain Assessment Pain Assessment: 0-10 Pain Score: 5  Pain Location: subscapular pain Pain Descriptors / Indicators: Aching, Grimacing, Guarding Pain Intervention(s): Limited activity within patient's tolerance, Monitored during session, Other  (comment) (RN alerted and assessing)    Home Living                          Prior Function            PT Goals (current goals can now be found in the care plan section) Acute Rehab PT Goals Patient Stated Goal: return home PT Goal Formulation: With patient/family Time For Goal Achievement: 07/26/21 Potential to Achieve Goals: Good Progress towards PT goals: Not progressing toward goals - comment (new onset subscapular pain - RN advises hold further PT for EKG)    Frequency    Min 3X/week      PT Plan Current plan remains appropriate    Co-evaluation              AM-PAC PT "6 Clicks" Mobility   Outcome Measure  Help needed turning from your back to your side while in a flat bed without using bedrails?: A Little Help needed moving from lying on your back to sitting on the side of a flat bed without using bedrails?: A Little Help needed moving to and from a bed to a chair (including a wheelchair)?: A Lot Help needed standing up from a chair using your arms (e.g., wheelchair or bedside chair)?: A Lot Help needed to walk in hospital room?: Total Help needed climbing 3-5 steps with a railing? : Total 6 Click Score: 12    End of Session Equipment Utilized During Treatment: Gait belt Activity Tolerance: Other (comment) Patient left: in bed;with call bell/phone within reach;with nursing/sitter in room Nurse Communication: Mobility status;Other (comment) PT Visit Diagnosis: Other abnormalities of gait and mobility (R26.89);Muscle weakness (generalized) (M62.81)     Time: 0630-1601 PT Time Calculation (min) (ACUTE ONLY): 32 min  Charges:  $Therapeutic Activity: 8-22 mins                     [place] Pager [phone] Office [phone]    [facility] 07/13/2021, 4:47 PM

## 2021-07-14 DIAGNOSIS — J9601 Acute respiratory failure with hypoxia: Secondary | ICD-10-CM | POA: Diagnosis not present

## 2021-07-14 DIAGNOSIS — I5033 Acute on chronic diastolic (congestive) heart failure: Secondary | ICD-10-CM | POA: Diagnosis not present

## 2021-07-14 LAB — COMPREHENSIVE METABOLIC PANEL
ALT: 28 U/L (ref 0–44)
AST: 19 U/L (ref 15–41)
Albumin: 2.8 g/dL — ABNORMAL LOW (ref 3.5–5.0)
Alkaline Phosphatase: 60 U/L (ref 38–126)
Anion gap: 6 (ref 5–15)
BUN: 24 mg/dL — ABNORMAL HIGH (ref 8–23)
CO2: 35 mmol/L — ABNORMAL HIGH (ref 22–32)
Calcium: 8.6 mg/dL — ABNORMAL LOW (ref 8.9–10.3)
Chloride: 90 mmol/L — ABNORMAL LOW (ref 98–111)
Creatinine, Ser: 1.12 mg/dL (ref 0.61–1.24)
GFR, Estimated: >60 mL/min (ref >60)
Glucose, Bld: 113 mg/dL — ABNORMAL HIGH (ref 70–99)
Potassium: 3.6 mmol/L (ref 3.5–5.1)
Sodium: 131 mmol/L — ABNORMAL LOW (ref 135–145)
Total Bilirubin: 0.6 mg/dL (ref 0.3–1.2)
Total Protein: 6.9 g/dL (ref 6.5–8.1)

## 2021-07-14 MED ORDER — ALUM & MAG HYDROXIDE-SIMETH 200-200-20 MG/5ML PO SUSP
30.0000 mL | Freq: Once | ORAL | Status: AC
  Administered 2021-07-14: 30 mL via ORAL
  Filled 2021-07-14: qty 30

## 2021-07-14 MED ORDER — LORAZEPAM 2 MG/ML IJ SOLN
0.5000 mg | Freq: Once | INTRAMUSCULAR | Status: AC
  Administered 2021-07-15: 0.5 mg via INTRAVENOUS
  Filled 2021-07-14: qty 1

## 2021-07-14 MED ORDER — ASPIRIN EC 81 MG PO TBEC
81.0000 mg | DELAYED_RELEASE_TABLET | Freq: Every morning | ORAL | Status: DC
  Administered 2021-07-14 – 2021-07-16 (×3): 81 mg via ORAL
  Filled 2021-07-14 (×3): qty 1

## 2021-07-14 MED ORDER — PROCHLORPERAZINE EDISYLATE 10 MG/2ML IJ SOLN
5.0000 mg | Freq: Once | INTRAMUSCULAR | Status: AC
  Administered 2021-07-14: 5 mg via INTRAVENOUS
  Filled 2021-07-14: qty 2

## 2021-07-14 MED ORDER — LORAZEPAM 2 MG/ML IJ SOLN
1.0000 mg | Freq: Once | INTRAMUSCULAR | Status: AC
Start: 2021-07-14 — End: 2021-07-14
  Administered 2021-07-14: 1 mg via INTRAVENOUS
  Filled 2021-07-14: qty 1

## 2021-07-14 NOTE — Care Management Important Message (Signed)
Important Message  Patient Details IM Letter placed in Patients room. Name: [name] MRN: [id number] Date of Birth: [birth year]   Medicare Important Message Given:  Yes     Kerin Salen 07/14/2021, 10:34 AM

## 2021-07-14 NOTE — TOC Initial Note (Signed)
Transition of Care [facility]) - Initial/Assessment Note    Patient Details  Name: [name] MRN: [id number] Date of Birth: 08-30-1932  Transition of Care [facility]) CM/SW Contact:    Dessa Phi, RN Phone Number: 07/14/2021, 3:38 PM  Clinical Narrative:Patient declines SNF wants home w/HHC-[facility] rep Stacie following for [facility] services-recc HHRN/PT/OT/aide/csw-Per [facility] rep Stacie OT may be delayed.Patient says he will have help at home.Has own transport home.                   Expected Discharge Plan: [place] Barriers to Discharge: Continued Medical Work up   Patient Goals and CMS Choice Patient states their goals for this hospitalization and ongoing recovery are:: Home CMS Medicare.gov Compare Post Acute Care list provided to:: Patient Choice offered to / list presented to : Patient  Expected Discharge Plan and Services Expected Discharge Plan: [place]   Discharge Planning Services: CM Consult Post Acute Care Choice: [place] arrangements for the past 2 months: Single Family Home                           HH Arranged: RN, OT, Nurse's Aide, Social Work [employer] Agency: [place] Date [place]: 07/14/21 Time [place]: 1538 Representative spoke with at [place]: [place] Arrangements/Services Living arrangements for the past 2 months: [place] Lives with:: Self Patient language and need for interpreter reviewed:: Yes Do you feel safe going back to the place where you live?: Yes      Need for Family Participation in Patient Care: Yes (Comment) Care giver support system in place?: Yes (comment) Current home services:  (rollator) Criminal Activity/Legal Involvement Pertinent to Current Situation/Hospitalization: No - Comment as needed  Activities of Daily Living Home Assistive Devices/Equipment: None ADL Screening (condition at time of admission) Patient's  cognitive ability adequate to safely complete daily activities?: Yes Is the patient deaf or have difficulty hearing?: No Does the patient have difficulty seeing, even when wearing glasses/contacts?: No Does the patient have difficulty concentrating, remembering, or making decisions?: No Patient able to express need for assistance with ADLs?: Yes Does the patient have difficulty dressing or bathing?: Yes Independently performs ADLs?: No Communication: Independent Dressing (OT): Needs assistance Is this a change from baseline?: Change from baseline, expected to last <3days Grooming: Needs assistance Is this a change from baseline?: Change from baseline, expected to last <3 days Feeding: Independent Bathing: Needs assistance Is this a change from baseline?: Change from baseline, expected to last <3 days Toileting: Needs assistance Is this a change from baseline?: Change from baseline, expected to last <3 days In/Out Bed: Needs assistance Is this a change from baseline?: Change from baseline, expected to last <3 days Walks in Home: Independent Does the patient have difficulty walking or climbing stairs?: Yes Weakness of Legs: Both Weakness of Arms/Hands: None  Permission Sought/Granted Permission sought to share information with : Case Manager Permission granted to share information with : Yes, Verbal Permission Granted  Share Information with NAME: Case Manager           Emotional Assessment              Admission diagnosis:  Hyponatremia [E87.1] Hypoxia [R09.02] Acute respiratory failure with hypoxia ([place]) [J96.01] AKI (acute kidney injury) ([place]) [N17.9] Multiple skin tears [T14.8XXA] Fall, initial encounter [id number]] CHF exacerbation ([place]) [I50.9] Patient Active Problem List  Diagnosis Date Noted   Renal insufficiency 07/11/2021   Acute on chronic diastolic CHF (congestive heart failure) ([place]) 07/11/2021   CHF exacerbation ([place]) 07/11/2021   Acute respiratory  failure with hypoxia ([place]) 07/10/2021   Cellulitis of right leg 10/15/2019   Hyperlipidemia 10/15/2019   ARF (acute renal failure) ([place]) 10/15/2019   Bullous pemphigoid 10/15/2019   Cellulitis and abscess of toe of right foot 07/25/2019   Foot osteomyelitis, right ([place]) 06/22/2019   Left leg cellulitis 12/31/2015   Edema of extremities 07/06/2013   PVC's (premature ventricular contractions)    Degenerative arthritis of hip 10/14/2012   Postoperative anemia due to acute blood loss 06/04/2011   Asthma, chronic 06/04/2011   Hyponatremia 06/04/2011   Tachycardia 04/07/2011   Hypertension 04/07/2011   BPH (benign prostatic hyperplasia) 04/07/2011   DJD (degenerative joint disease) 04/07/2011   PCP:  Lajean Manes, MD Pharmacy:   [id number] [address], [place] [address] Phone: [phone] Fax: [phone]     Social Determinants of Health (SDOH) Interventions    Readmission Risk Interventions No flowsheet data found.

## 2021-07-14 NOTE — Progress Notes (Signed)
Progress Note   Patient: [name] UTM:[id number] DOB: October 17, 1932 DOA: 07/10/2021     3 DOS: the patient was seen and examined on 07/14/2021   Brief hospital course: 88 y.o. male with medical history significant for chronic diastolic CHF, hypertension, asthma, bullous pemphigoid, and BMI 40, now presenting to the emergency department after a slip and fall.  Patient saw his PCP today for increased right leg swelling and redness, was started on doxycycline, had blood work performed including D-dimer which is said to be elevated to 2.2.  Following the visit, he slipped in the rain while getting out of a car, falling onto his right side.  He denies any significant pain but was noted to have periorbital hematoma on the right, some skin tears, and bruising to his chest, prompting family to bring him into the ED.  He does not feel particularly short of breath but family reports that he was hypoxic in the PCP office.   Assessment and Plan: No notes have been filed under this hospital service. Service: Hospitalist  1. Acute hypoxic respiratory failure; acute on chronic diastolic CHF  - Pt presents after a slip and fall and found to have oxygen saturation low 80s with minimal exertion; he was also said to be hypoxic in PCP office - BNP is elevated and he has significant peripheral edema  -Presenting BNP 236 -2d echo reviewed, findings of EF 60-65% with grade 2 diastolic dysfunction -Diuresed well this visit -Cont IV lasix for now -Repeat bmet in AM   2. Right leg swelling; elevated d-dimer  - Patient reports chronic Rt > Lt leg swelling but has had significant worsening recently, was started on doxycycline for RLE cellulitis, and had d-dimer 2.2 with PCP today  - Continue doxycycline -LE dopplers neg for DVT, reviewed   3. Renal insufficiency; hyponatremia  - SCr is 1.56 on admission, up from 0.97 in June 2021; serum sodium is 128 on admission   - He is hypervolemic on admission. Cont lasix  as tolerated -Renal function and sodium improving with diuresis. Repeat bmet in AM   4. Hypertension  - Continue Norvasc, clonidine, metoprolol    5. Bullous pemphigoid  - Stable, managed with methotrexate    6. Asthma  - No cough or wheezing on admission  - Continue Singulair and as-needed albuterol   7. Hyponatremia - Improved with diuresis -Recheck bmet in AM      Subjective: States feeling better today  Physical Exam: Vitals:   07/13/21 1950 07/14/21 0611 07/14/21 0806 07/14/21 1540  BP: 128/64 (!) 157/75  (!) 117/51  Pulse: 65 72  70  Resp: 18   18  Temp: 98.1 F (36.7 C) 97.7 F (36.5 C)  98.3 F (36.8 C)  TempSrc: Oral Oral  Oral  SpO2: 93% 95%  93%  Weight:   116.3 kg   Height:       General exam: Conversant, in no acute distress Respiratory system: normal chest rise, clear, no audible wheezing Cardiovascular system: regular rhythm, s1-s2 Gastrointestinal system: Nondistended, nontender, pos BS Central nervous system: No seizures, no tremors Extremities: No cyanosis, no joint deformities, BLE edema improving Skin: No rashes, no pallor Psychiatry: Affect normal // no auditory hallucinations   Data Reviewed:  LE dopplers reviewed, no LLE DVT  Family Communication: Pt in room, family not at bedside  Disposition: Status is: Inpatient Requires continued inpatient stay because: Severity of illness requiring IV lasix    Planned Discharge Destination: Skilled nursing facility  Author: Marylu Lund, MD 07/14/2021 5:34 PM  For on call review [email or web address].

## 2021-07-15 DIAGNOSIS — J9601 Acute respiratory failure with hypoxia: Secondary | ICD-10-CM | POA: Diagnosis not present

## 2021-07-15 DIAGNOSIS — N179 Acute kidney failure, unspecified: Secondary | ICD-10-CM | POA: Diagnosis not present

## 2021-07-15 DIAGNOSIS — L120 Bullous pemphigoid: Secondary | ICD-10-CM | POA: Diagnosis not present

## 2021-07-15 LAB — COMPREHENSIVE METABOLIC PANEL
ALT: 33 U/L (ref 0–44)
AST: 25 U/L (ref 15–41)
Albumin: 3 g/dL — ABNORMAL LOW (ref 3.5–5.0)
Alkaline Phosphatase: 68 U/L (ref 38–126)
Anion gap: 10 (ref 5–15)
BUN: 25 mg/dL — ABNORMAL HIGH (ref 8–23)
CO2: 34 mmol/L — ABNORMAL HIGH (ref 22–32)
Calcium: 9.1 mg/dL (ref 8.9–10.3)
Chloride: 89 mmol/L — ABNORMAL LOW (ref 98–111)
Creatinine, Ser: 1.28 mg/dL — ABNORMAL HIGH (ref 0.61–1.24)
GFR, Estimated: 55 mL/min — ABNORMAL LOW (ref >60)
Glucose, Bld: 98 mg/dL (ref 70–99)
Potassium: 3.7 mmol/L (ref 3.5–5.1)
Sodium: 133 mmol/L — ABNORMAL LOW (ref 135–145)
Total Bilirubin: 0.8 mg/dL (ref 0.3–1.2)
Total Protein: 7.1 g/dL (ref 6.5–8.1)

## 2021-07-15 MED ORDER — BENZONATATE 100 MG PO CAPS
100.0000 mg | ORAL_CAPSULE | Freq: Three times a day (TID) | ORAL | Status: DC | PRN
  Administered 2021-07-15 (×2): 100 mg via ORAL
  Filled 2021-07-15 (×2): qty 1

## 2021-07-15 NOTE — TOC Progression Note (Addendum)
Transition of Care [facility]) - Progression Note    Patient Details  Name: [name] MRN: [id number] Date of Birth: [birth year]  [facility]) CM/SW Contact  Theodoro Koval, Marjie Skiff, RN Phone Number: 07/15/2021, 2:23 PM  Clinical Narrative:    Per MD pt has now agreed to SNF. Spoke with pt at bedside and he does agree to SNF.  Pt FL2 was faxed out previously and has bed offers. Pt given bed offers and chooses [place]. [place] liaison alerted of bed acceptance. Insurance auth started with [employer] #[id number]. Await insurance auth. TOC will continue to follow.  Addendum:  Insurance auth received: 3/1-3/3 [id number].  Expected Discharge Plan: [place] Barriers to Discharge: Continued Medical Work up  Expected Discharge Plan and Services Expected Discharge Plan: [place]   Discharge Planning Services: CM Consult Post Acute Care Choice: [place] arrangements for the past 2 months: Single Family Home                           HH Arranged: RN, OT, Nurse's Aide, Social Work [employer] Agency: [place] Date [place]: 07/14/21 Time [place]: 1538 Representative spoke with at [place]: [place] ([place]) Interventions    Readmission Risk Interventions No flowsheet data found.

## 2021-07-15 NOTE — Progress Notes (Signed)
Progress Note   Patient: [name]:[id number] DOB: [birth year] DOA: 07/10/2021     4 DOS: the patient was seen and examined on 07/15/2021   Brief hospital course: 88 y.o. male with medical history significant for chronic diastolic CHF, hypertension, asthma, bullous pemphigoid, and BMI 40, now presenting to the emergency department after a slip and fall.  Patient saw his PCP today for increased right leg swelling and redness, was started on doxycycline, had blood work performed including D-dimer which is said to be elevated to 2.2.  Following the visit, he slipped in the rain while getting out of a car, falling onto his right side.  He denies any significant pain but was noted to have periorbital hematoma on the right, some skin tears, and bruising to his chest, prompting family to bring him into the ED.  He does not feel particularly short of breath but family reports that he was hypoxic in the PCP office.   Assessment and Plan: No notes have been filed under this hospital service. Service: Hospitalist  1. Acute hypoxic respiratory failure; acute on chronic diastolic CHF  - Pt presents after a slip and fall and found to have oxygen saturation low 80s with minimal exertion; he was also said to be hypoxic in PCP office - BNP is elevated and he has significant peripheral edema  -Presenting BNP 236 -2d echo reviewed, findings of EF 60-65% with grade 2 diastolic dysfunction -Diuresed well this visit -Now showing signs of early dehydration with downtrending Cl, rising bicarb, rising BUN, Cr 1.28 -Recheck bmet in AM   2. Right leg swelling; elevated d-dimer  - Patient reports chronic Rt > Lt leg swelling but has had significant worsening recently, was started on doxycycline for RLE cellulitis, and had d-dimer 2.2 with PCP today  - Continue doxycycline -LE dopplers neg for DVT, reviewed   3. Renal insufficiency; hyponatremia  - SCr is 1.56 on admission, up from 0.97 in June 2021; serum  sodium is 128 on admission   - Initially hypervolemic on presentation, improved with lasix -Renal function initially improved with diuresis, now Cr trending up with lasix, thus diuretics now on hold - Repeat bmet in AM   4. Hypertension  - Continue Norvasc, clonidine, metoprolol  -BP well controlled   5. Bullous pemphigoid  - Stable, managed with methotrexate    6. Asthma  - No cough or wheezing on admission  - Continue Singulair and as-needed albuterol   7. Hyponatremia - Improved with diuresis - Lasix now on hold secondary to concerns of early dehydration      Subjective: Feeling more thirsty with dry moth  Physical Exam: Vitals:   07/15/21 0503 07/15/21 0827 07/15/21 0831 07/15/21 1323  BP: (!) 106/91 (!) 163/77  102/64  Pulse: 77 83  78  Resp: 14   18  Temp: (!) 97.5 F (36.4 C)   97.7 F (36.5 C)  TempSrc: Oral   Oral  SpO2: 92% (!) 85% 94% 97%  Weight: 115.2 kg     Height:       General exam: Awake, laying in bed, in nad Respiratory system: Normal respiratory effort, no wheezing Cardiovascular system: regular rate, s1, s2 Gastrointestinal system: Soft, nondistended, positive BS Central nervous system: CN2-12 grossly intact, strength intact Extremities: Perfused, no clubbing Skin: Normal skin turgor, no notable skin lesions seen Psychiatry: Mood normal // no visual hallucinations   Data Reviewed:  Labs reviewed, Cr 1.28  Family Communication: Pt in room, family not at  bedside  Disposition: Status is: Inpatient Requires continued inpatient stay because: Severity of illness requiring IV lasix    Planned Discharge Destination: Skilled nursing facility     Author: Marylu Lund, MD 07/15/2021 3:01 PM  For on call review [email or web address].

## 2021-07-15 NOTE — Progress Notes (Signed)
Physical Therapy Treatment Patient Details Name: [name] MRN: [id number] DOB: October 24, 1932 Today's Date: 07/15/2021   History of Present Illness Pt is 88 yo male admitted on 07/10/21 after fall in rain on R side, pt found to have acute resp failure due to acute on chronic CHF and R LE cellulitis. PMH includes but not limited to chronic diastolic CHF, hypertension, asthma, bullous pemphigoid, Bil TKA, Bil THA, L TSA, and BMI 40    PT Comments    Pt appears to have some confusion, able to recall coming into hospital and current date accurately but unaware of events that have occurred while in hospital; pt unaware he has been out of bed and ambulated with therapy, reports being bedbound since being in hospital- RN notified of pt's confusion. Pt requires max A for supine<>sit, max assist to lift trunk into sitting and then max assist to lift BLE back into bed with return to supine. Pt needing +2 with use of bedpad to scoot up in bed. Mod A to roll R/L to change linen. Attempted to stand a EOB with +2 assist, but pt unable; active BLE movement noted in sitting and supine but difficulty powering up to stand with BLE and fatigued from sitting EOB. Pt pleasant and following commands, though requiring significant more assistance this session. Pt needing cues for sequencing and mobility, denies pain, denies SOB, denies fatigue, but does appear somewhat flat.   Recommendations for follow up therapy are one component of a multi-disciplinary discharge planning process, led by the attending physician.  Recommendations may be updated based on patient status, additional functional criteria and insurance authorization.  Follow Up Recommendations  Skilled nursing-short term rehab (<3 hours/day)     Assistance Recommended at Discharge Frequent or constant Supervision/Assistance  Patient can return home with the following A lot of help with walking and/or transfers;Assistance with cooking/housework;A lot of  help with bathing/dressing/bathroom;Assist for transportation   Equipment Recommendations  None recommended by PT    Recommendations for Other Services       Precautions / Restrictions Precautions Precautions: Fall Precaution Comments: BLE wounds Restrictions Weight Bearing Restrictions: No     Mobility  Bed Mobility Overal bed mobility: Needs Assistance Bed Mobility: Supine to Sit, Rolling, Sit to Supine Rolling: Mod assist  Supine to sit: Max assist, HOB elevated Sit to supine: Max assist  General bed mobility comments: max A for supine<>sit transfers, max cues for sequencing and hand placement, able to inch BLE over to EOB requiring max A to upright trunk into sitting, max A to lift BLE ack into bed and +2 with use of bed pad to scoot pt up in bed; mod A to roll R/L in supine to change soiled linen, VC for hand placement and LE positioning to assist    Transfers Overall transfer level: Needs assistance  Transfers: Sit to/from Stand Sit to Stand: Max assist, +2 physical assistance  General transfer comment: unable to power to stand with max A+2, attempted to raise bed but pt reports fear of sliding off bed, assisted back to supine    Ambulation/Gait  General Gait Details: unable   Stairs             Wheelchair Mobility    Modified Rankin (Stroke Patients Only)       Balance Overall balance assessment: Needs assistance Sitting-balance support: Feet supported, Bilateral upper extremity supported Sitting balance-Leahy Scale: Poor Sitting balance - Comments: requires BUE support, occasional posterior lean improves with cues, fatigues while seated  EOB Postural control: Posterior lean      Cognition Arousal/Alertness: Awake/alert Behavior During Therapy: WFL for tasks assessed/performed Overall Cognitive Status: No family/caregiver present to determine baseline cognitive functioning  General Comments: Pt reports he hasn't been out of bed since Thursday when  he came to hospital, pt aware it is Tuesday. PT educated pt on therapy over the weekend but pt unable to recall any of it and reports he only remembers being flat in the bed since admission- RN aware.        Exercises      General Comments        Pertinent Vitals/Pain Pain Assessment Pain Assessment: No/denies pain    Home Living                          Prior Function            PT Goals (current goals can now be found in the care plan section) Acute Rehab PT Goals Patient Stated Goal: return home PT Goal Formulation: With patient/family Time For Goal Achievement: 07/26/21 Potential to Achieve Goals: Good Progress towards PT goals: Not progressing toward goals - comment (limited by weakness, new confusion?)    Frequency    Min 3X/week      PT Plan Current plan remains appropriate    Co-evaluation              AM-PAC PT "6 Clicks" Mobility   Outcome Measure  Help needed turning from your back to your side while in a flat bed without using bedrails?: A Lot Help needed moving from lying on your back to sitting on the side of a flat bed without using bedrails?: A Lot Help needed moving to and from a bed to a chair (including a wheelchair)?: Total Help needed standing up from a chair using your arms (e.g., wheelchair or bedside chair)?: Total Help needed to walk in hospital room?: Total Help needed climbing 3-5 steps with a railing? : Total 6 Click Score: 8    End of Session Equipment Utilized During Treatment: Gait belt Activity Tolerance: Patient limited by fatigue Patient left: in bed;with call bell/phone within reach;with bed alarm set Nurse Communication: Mobility status;Other (comment) (confusion) PT Visit Diagnosis: Other abnormalities of gait and mobility (R26.89);Muscle weakness (generalized) (M62.81)     Time: 7371-0626 PT Time Calculation (min) (ACUTE ONLY): 31 min  Charges:  $Therapeutic Activity: 23-37 mins                       Tori Arlen Dupuis PT, DPT 07/15/21, 1:37 PM

## 2021-07-16 DIAGNOSIS — E669 Obesity, unspecified: Secondary | ICD-10-CM | POA: Diagnosis present

## 2021-07-16 DIAGNOSIS — Z20822 Contact with and (suspected) exposure to covid-19: Secondary | ICD-10-CM | POA: Diagnosis present

## 2021-07-16 DIAGNOSIS — R059 Cough, unspecified: Secondary | ICD-10-CM | POA: Diagnosis not present

## 2021-07-16 DIAGNOSIS — R5383 Other fatigue: Secondary | ICD-10-CM | POA: Diagnosis not present

## 2021-07-16 DIAGNOSIS — R5381 Other malaise: Secondary | ICD-10-CM

## 2021-07-16 DIAGNOSIS — N281 Cyst of kidney, acquired: Secondary | ICD-10-CM | POA: Diagnosis not present

## 2021-07-16 DIAGNOSIS — K5900 Constipation, unspecified: Secondary | ICD-10-CM | POA: Diagnosis present

## 2021-07-16 DIAGNOSIS — E875 Hyperkalemia: Secondary | ICD-10-CM | POA: Diagnosis present

## 2021-07-16 DIAGNOSIS — L259 Unspecified contact dermatitis, unspecified cause: Secondary | ICD-10-CM | POA: Diagnosis not present

## 2021-07-16 DIAGNOSIS — N3001 Acute cystitis with hematuria: Secondary | ICD-10-CM | POA: Diagnosis not present

## 2021-07-16 DIAGNOSIS — Z66 Do not resuscitate: Secondary | ICD-10-CM

## 2021-07-16 DIAGNOSIS — Z7401 Bed confinement status: Secondary | ICD-10-CM | POA: Diagnosis not present

## 2021-07-16 DIAGNOSIS — R52 Pain, unspecified: Secondary | ICD-10-CM | POA: Diagnosis not present

## 2021-07-16 DIAGNOSIS — F32A Depression, unspecified: Secondary | ICD-10-CM | POA: Diagnosis not present

## 2021-07-16 DIAGNOSIS — W19XXXS Unspecified fall, sequela: Secondary | ICD-10-CM | POA: Diagnosis not present

## 2021-07-16 DIAGNOSIS — Z9181 History of falling: Secondary | ICD-10-CM | POA: Diagnosis not present

## 2021-07-16 DIAGNOSIS — Z888 Allergy status to other drugs, medicaments and biological substances status: Secondary | ICD-10-CM | POA: Diagnosis not present

## 2021-07-16 DIAGNOSIS — N189 Chronic kidney disease, unspecified: Secondary | ICD-10-CM | POA: Diagnosis not present

## 2021-07-16 DIAGNOSIS — J9601 Acute respiratory failure with hypoxia: Secondary | ICD-10-CM | POA: Diagnosis not present

## 2021-07-16 DIAGNOSIS — D649 Anemia, unspecified: Secondary | ICD-10-CM | POA: Diagnosis not present

## 2021-07-16 DIAGNOSIS — F4322 Adjustment disorder with anxiety: Secondary | ICD-10-CM | POA: Diagnosis not present

## 2021-07-16 DIAGNOSIS — L120 Bullous pemphigoid: Secondary | ICD-10-CM | POA: Diagnosis present

## 2021-07-16 DIAGNOSIS — I110 Hypertensive heart disease with heart failure: Secondary | ICD-10-CM | POA: Diagnosis present

## 2021-07-16 DIAGNOSIS — R0989 Other specified symptoms and signs involving the circulatory and respiratory systems: Secondary | ICD-10-CM | POA: Diagnosis not present

## 2021-07-16 DIAGNOSIS — Y92007 Garden or yard of unspecified non-institutional (private) residence as the place of occurrence of the external cause: Secondary | ICD-10-CM | POA: Diagnosis not present

## 2021-07-16 DIAGNOSIS — I5033 Acute on chronic diastolic (congestive) heart failure: Secondary | ICD-10-CM | POA: Diagnosis not present

## 2021-07-16 DIAGNOSIS — J9620 Acute and chronic respiratory failure, unspecified whether with hypoxia or hypercapnia: Secondary | ICD-10-CM | POA: Diagnosis not present

## 2021-07-16 DIAGNOSIS — L97919 Non-pressure chronic ulcer of unspecified part of right lower leg with unspecified severity: Secondary | ICD-10-CM | POA: Diagnosis not present

## 2021-07-16 DIAGNOSIS — N400 Enlarged prostate without lower urinary tract symptoms: Secondary | ICD-10-CM | POA: Diagnosis present

## 2021-07-16 DIAGNOSIS — I5032 Chronic diastolic (congestive) heart failure: Secondary | ICD-10-CM | POA: Diagnosis present

## 2021-07-16 DIAGNOSIS — L309 Dermatitis, unspecified: Secondary | ICD-10-CM | POA: Diagnosis not present

## 2021-07-16 DIAGNOSIS — E559 Vitamin D deficiency, unspecified: Secondary | ICD-10-CM | POA: Diagnosis not present

## 2021-07-16 DIAGNOSIS — E871 Hypo-osmolality and hyponatremia: Secondary | ICD-10-CM | POA: Diagnosis present

## 2021-07-16 DIAGNOSIS — R051 Acute cough: Secondary | ICD-10-CM | POA: Diagnosis not present

## 2021-07-16 DIAGNOSIS — R609 Edema, unspecified: Secondary | ICD-10-CM | POA: Diagnosis not present

## 2021-07-16 DIAGNOSIS — W19XXXA Unspecified fall, initial encounter: Secondary | ICD-10-CM | POA: Diagnosis not present

## 2021-07-16 DIAGNOSIS — M1990 Unspecified osteoarthritis, unspecified site: Secondary | ICD-10-CM | POA: Diagnosis present

## 2021-07-16 DIAGNOSIS — Z23 Encounter for immunization: Secondary | ICD-10-CM | POA: Diagnosis present

## 2021-07-16 DIAGNOSIS — L0390 Cellulitis, unspecified: Secondary | ICD-10-CM | POA: Diagnosis present

## 2021-07-16 DIAGNOSIS — R42 Dizziness and giddiness: Secondary | ICD-10-CM | POA: Diagnosis not present

## 2021-07-16 DIAGNOSIS — I959 Hypotension, unspecified: Secondary | ICD-10-CM | POA: Diagnosis present

## 2021-07-16 DIAGNOSIS — I952 Hypotension due to drugs: Secondary | ICD-10-CM | POA: Diagnosis not present

## 2021-07-16 DIAGNOSIS — Z96642 Presence of left artificial hip joint: Secondary | ICD-10-CM | POA: Diagnosis present

## 2021-07-16 DIAGNOSIS — E860 Dehydration: Secondary | ICD-10-CM | POA: Diagnosis present

## 2021-07-16 DIAGNOSIS — F411 Generalized anxiety disorder: Secondary | ICD-10-CM | POA: Diagnosis not present

## 2021-07-16 DIAGNOSIS — N390 Urinary tract infection, site not specified: Secondary | ICD-10-CM | POA: Diagnosis present

## 2021-07-16 DIAGNOSIS — Z96653 Presence of artificial knee joint, bilateral: Secondary | ICD-10-CM | POA: Diagnosis present

## 2021-07-16 DIAGNOSIS — N289 Disorder of kidney and ureter, unspecified: Secondary | ICD-10-CM | POA: Diagnosis not present

## 2021-07-16 DIAGNOSIS — I10 Essential (primary) hypertension: Secondary | ICD-10-CM | POA: Diagnosis not present

## 2021-07-16 DIAGNOSIS — E785 Hyperlipidemia, unspecified: Secondary | ICD-10-CM | POA: Diagnosis present

## 2021-07-16 DIAGNOSIS — I517 Cardiomegaly: Secondary | ICD-10-CM | POA: Diagnosis not present

## 2021-07-16 DIAGNOSIS — R410 Disorientation, unspecified: Secondary | ICD-10-CM | POA: Diagnosis not present

## 2021-07-16 DIAGNOSIS — Z79899 Other long term (current) drug therapy: Secondary | ICD-10-CM | POA: Diagnosis not present

## 2021-07-16 DIAGNOSIS — L03115 Cellulitis of right lower limb: Secondary | ICD-10-CM | POA: Diagnosis present

## 2021-07-16 DIAGNOSIS — R2689 Other abnormalities of gait and mobility: Secondary | ICD-10-CM | POA: Diagnosis not present

## 2021-07-16 DIAGNOSIS — D529 Folate deficiency anemia, unspecified: Secondary | ICD-10-CM | POA: Diagnosis not present

## 2021-07-16 DIAGNOSIS — Z7982 Long term (current) use of aspirin: Secondary | ICD-10-CM | POA: Diagnosis not present

## 2021-07-16 DIAGNOSIS — R0902 Hypoxemia: Secondary | ICD-10-CM | POA: Diagnosis not present

## 2021-07-16 DIAGNOSIS — Z885 Allergy status to narcotic agent status: Secondary | ICD-10-CM | POA: Diagnosis not present

## 2021-07-16 DIAGNOSIS — M6281 Muscle weakness (generalized): Secondary | ICD-10-CM | POA: Diagnosis not present

## 2021-07-16 DIAGNOSIS — R531 Weakness: Secondary | ICD-10-CM | POA: Diagnosis not present

## 2021-07-16 DIAGNOSIS — N3000 Acute cystitis without hematuria: Secondary | ICD-10-CM | POA: Diagnosis not present

## 2021-07-16 DIAGNOSIS — N179 Acute kidney failure, unspecified: Secondary | ICD-10-CM | POA: Diagnosis present

## 2021-07-16 DIAGNOSIS — R600 Localized edema: Secondary | ICD-10-CM | POA: Diagnosis not present

## 2021-07-16 DIAGNOSIS — F064 Anxiety disorder due to known physiological condition: Secondary | ICD-10-CM | POA: Diagnosis not present

## 2021-07-16 DIAGNOSIS — Z8249 Family history of ischemic heart disease and other diseases of the circulatory system: Secondary | ICD-10-CM | POA: Diagnosis not present

## 2021-07-16 LAB — CBC
HCT: 37.8 % — ABNORMAL LOW (ref 39.0–52.0)
Hemoglobin: 12.6 g/dL — ABNORMAL LOW (ref 13.0–17.0)
MCH: 35.1 pg — ABNORMAL HIGH (ref 26.0–34.0)
MCHC: 33.3 g/dL (ref 30.0–36.0)
MCV: 105.3 fL — ABNORMAL HIGH (ref 80.0–100.0)
Platelets: 273 10*3/uL (ref 150–400)
RBC: 3.59 MIL/uL — ABNORMAL LOW (ref 4.22–5.81)
RDW: 13 % (ref 11.5–15.5)
WBC: 7.7 10*3/uL (ref 4.0–10.5)
nRBC: 0 % (ref 0.0–0.2)

## 2021-07-16 LAB — RESP PANEL BY RT-PCR (FLU A&B, COVID) ARPGX2
Influenza A by PCR: NEGATIVE
Influenza B by PCR: NEGATIVE
SARS Coronavirus 2 by RT PCR: NEGATIVE

## 2021-07-16 LAB — RENAL FUNCTION PANEL
Albumin: 3 g/dL — ABNORMAL LOW (ref 3.5–5.0)
Anion gap: 11 (ref 5–15)
BUN: 27 mg/dL — ABNORMAL HIGH (ref 8–23)
CO2: 32 mmol/L (ref 22–32)
Calcium: 9.1 mg/dL (ref 8.9–10.3)
Chloride: 88 mmol/L — ABNORMAL LOW (ref 98–111)
Creatinine, Ser: 1.19 mg/dL (ref 0.61–1.24)
GFR, Estimated: 59 mL/min — ABNORMAL LOW (ref >60)
Glucose, Bld: 117 mg/dL — ABNORMAL HIGH (ref 70–99)
Phosphorus: 3.2 mg/dL (ref 2.5–4.6)
Potassium: 3.7 mmol/L (ref 3.5–5.1)
Sodium: 131 mmol/L — ABNORMAL LOW (ref 135–145)

## 2021-07-16 LAB — BRAIN NATRIURETIC PEPTIDE: B Natriuretic Peptide: 60.6 pg/mL (ref 0.0–100.0)

## 2021-07-16 LAB — MAGNESIUM: Magnesium: 2 mg/dL (ref 1.7–2.4)

## 2021-07-16 MED ORDER — TRIAMCINOLONE 0.1 % CREAM:EUCERIN CREAM 1:1
TOPICAL_CREAM | Freq: Two times a day (BID) | CUTANEOUS | Status: DC
  Filled 2021-07-16: qty 1

## 2021-07-16 MED ORDER — DOXYCYCLINE HYCLATE 100 MG PO TABS: 100.0000 mg | ORAL_TABLET | Freq: Two times a day (BID) | ORAL | 0 refills | Status: AC

## 2021-07-16 MED ORDER — FUROSEMIDE 40 MG PO TABS: 40.0000 mg | ORAL_TABLET | Freq: Every day | ORAL | Status: DC

## 2021-07-16 MED ORDER — EUCERIN EX CREA: TOPICAL_CREAM | CUTANEOUS | 0 refills | Status: AC

## 2021-07-16 MED ORDER — SENNOSIDES-DOCUSATE SODIUM 8.6-50 MG PO TABS: 1.0000 | ORAL_TABLET | Freq: Two times a day (BID) | ORAL | Status: AC | PRN

## 2021-07-16 NOTE — Discharge Summary (Addendum)
Physician Discharge Summary   Patient: [name] MRN: [id number] DOB: Apr 07, 1933  Admit date:     07/10/2021  Discharge date: 07/16/21  Discharge Physician: Mercy Riding   PCP: Lajean Manes, MD   Recommendations at discharge:   Reassess fluid status and adjust diuretics as appropriate. Reassess blood pressure and adjust antihypertensive meds as appropriate. Recheck renal functions and electrolytes in 1 week Reassess LLE edema/wound.  Dressing change as below.  Discharge Diagnoses: Principal Problem:   Acute respiratory failure with hypoxia (HCC) Active Problems:   Hypertension   Asthma, chronic   Hyponatremia   Cellulitis of right leg   Bullous pemphigoid   Renal insufficiency   Acute on chronic diastolic CHF (congestive heart failure) (HCC)   CHF exacerbation (HCC)  Resolved Problems:   * No resolved hospital problems. *   Hospital Course: 88 y.o. M with PMH of diastolic CHF, HTN, asthma, bullous pemphigoid on MTX, chronic RLE swelling, chronic venous insufficiency and morbid obesity presented to ED after he slipped and fell when he tried to get out of the car.  Earlier on the day of presentation, he was evaluated by PCP with RLE swelling and redness for which he was started on doxycycline.he was previously on Keflex.  He denies head trauma, LOC or any significant pain but was noted to have periorbital hematoma on the right, some skin tears, and bruising to his chest, prompting family to bring him into the ED.  He does not feel particularly short of breath but family reports that he was hypoxic in the PCP office.  He was admitted with working diagnosis of acute respiratory failure with hypoxia in the setting of diastolic CHF exacerbation and RLE cellulitis.  Briefly received IV diuretics which was later discontinued due to rising creatinine.  He was liberated off oxygen but not able to ambulate for saturation assessment.  He was continued on doxycycline for cellulitis.   Therapy recommended SNF.  See individual problem list below for more hospital course.   Assessment and Plan: Acute respiratory failure with hypoxia: Resolved.  Likely due to CHF.  Acute on chronic diastolic CHF: Appears euvolemic except for BLE edema which is difficult to assess given underlying chronic venous insufficiency.  He was on p.o. Lasix as needed prior to admission.  Weight down from 287 pounds to 252 pounds.  BNP down to 60.6 on the day of discharge. -Scheduled Lasix 40 mg daily -Continue home eplerenone -Discontinued lisinopril and quinapril -Continue home Toprol-XL -Reassess fluid status and renal function -Sodium restriction to less than 2 g and fluid restriction to less than 1500 cc a day  Essential hypertension: Normotensive. -Discontinue lisinopril and quinapril to allow room for diuretics with scheduled Lasix. -Discontinued amlodipine given chronic renal insufficiency/edema. -Continue Toprol-XL  History of bullous pemphigoid -Continue home methotrexate  RLE swelling: Reportedly chronic.  He has elevated D-dimer to 2.2 but LE Doppler negative for DVT. -Diuretics as above -Discontinued amlodipine -Encourage leg elevation  RLE cellulitis/laceration -Continue doxycycline for 3 more days to complete course  AKI: Resolved. Recent Labs    07/10/21 1824 07/11/21 0556 07/12/21 0546 07/13/21 0939 07/14/21 1149 07/15/21 0515 07/16/21 0757  BUN 33* 27* 22 26* 24* 25* 27*  CREATININE 1.56* 1.20 1.13 1.18 1.12 1.28* 1.19  -Discontinued ACE inhibitors as above -Recheck renal function in 1 to 2 weeks  History of asthma?  No formal PFT or spirometry on chart.  Does not seem to be on inhalers. -May benefit from spirometry outpatient  Generalized weakness/physical deconditioning: -PT/OT at SNF.  Contact dermatitis: Likely from lying in bed. -Eucerin cream twice daily  Goal of care: DNR/DNI.   Consultants: None Procedures performed: None Disposition: Skilled  nursing facility Diet recommendation:  Discharge Diet Orders (From admission, onward)     Start     Ordered   07/16/21 0000  Diet - low sodium heart healthy        07/16/21 1033           Cardiac diet  DISCHARGE MEDICATION: Allergies as of 07/16/2021       Reactions   Spironolactone Nausea Only   Hydrocodone Nausea And Vomiting        Medication List     STOP taking these medications    amLODipine 2.5 MG tablet Commonly known as: NORVASC   cephALEXin 500 MG capsule Commonly known as: KEFLEX   lisinopril 20 MG tablet Commonly known as: ZESTRIL   quinapril 40 MG tablet Commonly known as: ACCUPRIL   traMADol 50 MG tablet Commonly known as: ULTRAM       TAKE these medications    aspirin EC 81 MG tablet Take 81 mg by mouth every morning. Swallow whole.   atorvastatin 10 MG tablet Commonly known as: LIPITOR Take 10 mg by mouth daily.   CENTRUM SILVER 50+MEN PO Take 1 tablet by mouth every morning.   cloNIDine 0.2 MG tablet Commonly known as: CATAPRES Take 0.2 mg by mouth 2 (two) times daily.   diclofenac 75 MG EC tablet Commonly known as: VOLTAREN Take 75-150 mg by mouth See admin instructions. 150mg  oral in the morning 75mg  oral in the evening   doxycycline 100 MG tablet Commonly known as: VIBRA-TABS Take 1 tablet (100 mg total) by mouth 2 (two) times daily for 3 days. 10 day supply   eplerenone 25 MG tablet Commonly known as: INSPRA Take 25 mg by mouth daily.   eucerin cream Apply to back and other affected area of skin  twice a day   finasteride 5 MG tablet Commonly known as: PROSCAR Take 5 mg by mouth every morning.   folic acid 1 MG tablet Commonly known as: FOLVITE Take 1 mg by mouth every morning.   furosemide 40 MG tablet Commonly known as: LASIX Take 1 tablet (40 mg total) by mouth daily. What changed:  when to take this reasons to take this   gabapentin 100 MG capsule Commonly known as: NEURONTIN Take 100 mg by mouth  in the morning and at bedtime. What changed: Another medication with the same name was removed. Continue taking this medication, and follow the directions you see here.   methotrexate 2.5 MG tablet Commonly known as: RHEUMATREX Take 10 mg by mouth every Friday. Caution:Chemotherapy. Protect from light.  Friday   metoprolol succinate 100 MG 24 hr tablet Commonly known as: TOPROL-XL Take 150 mg by mouth every morning.   montelukast 10 MG tablet Commonly known as: SINGULAIR Take 10 mg by mouth daily.   omega-3 acid ethyl esters 1 g capsule Commonly known as: LOVAZA Take 1 g by mouth 2 (two) times daily.   polyethylene glycol 17 g packet Commonly known as: MIRALAX / GLYCOLAX Take 17 g by mouth daily as needed for mild constipation.   Restasis 0.05 % ophthalmic emulsion Generic drug: cycloSPORINE Place 1 drop into both eyes 2 (two) times daily as needed (dry eyes).   senna-docusate 8.6-50 MG tablet Commonly known as: Senokot-S Take 1 tablet by mouth 2 (two) times daily as  needed for mild constipation.   Vitamin D3 125 MCG (5000 UT) Caps Take 5,000 Units by mouth daily.               Discharge Care Instructions  (From admission, onward)           Start     Ordered   07/16/21 0000  Discharge wound care:       Comments: Wound care  Daily      Wound care to left lateral LE and right lateral LE, also right hand skin tears (full thickness skin loss):  Cleanse with NS, pat gently dry. Cover with folded pieces of xeroform gauze Kellie Simmering # 294), top with ABD pads or dry gauze and secure with silicone foam or Kerlix roll gauze/paper tape. Change daily.   07/16/21 1033             Discharge Exam: Filed Weights   07/14/21 0806 07/15/21 0503 07/16/21 0629  Weight: 116.3 kg 115.2 kg 114.7 kg   Vitals:   07/15/21 1323 07/15/21 2103 07/16/21 0629 07/16/21 1000  BP: 102/64 112/61 (!) 129/56   Pulse: 78 82 84   Resp: 18 14 14    Temp: 97.7 F (36.5 C) 98.6 F (37 C)  98.5 F (36.9 C)   TempSrc: Oral Oral Oral   SpO2: 97% 96% 99% 91%  Weight:   114.7 kg   Height:        GENERAL: No apparent distress.  Nontoxic. HEENT: MMM.  Vision and hearing grossly intact.  NECK: Supple.  No apparent JVD.  RESP:  No IWOB.  Fair aeration bilaterally. CVS:  RRR. Heart sounds normal.  ABD/GI/GU: BS+. Abd soft, NTND.  MSK/EXT:  Moves extremities.  BLE edema, right> left.  Signs of chronic venous sufficiency.  RLE skin wound SKIN: Diffuse skin erythema over right back NEURO: Awake and alert. Oriented appropriately.  No apparent focal neuro deficit. PSYCH: Calm. Normal affect.   Condition at discharge: fair  The results of significant diagnostics from this hospitalization (including imaging, microbiology, ancillary and laboratory) are listed below for reference.   Imaging Studies: DG Ribs Unilateral W/Chest Right  Result Date: 07/10/2021 CLINICAL DATA:  Fall with skin tear EXAM: RIGHT RIBS AND CHEST - 3+ VIEW COMPARISON:  01/11/2017 FINDINGS: Single view chest demonstrates chronic elevation of right diaphragm with subsegmental atelectasis. No acute consolidation, pleural effusion or pneumothorax. Stable cardiomediastinal silhouette. Right rib series demonstrates no acute displaced right rib fracture IMPRESSION: Negative. Electronically Signed   By: Donavan Foil M.D.   On: 07/10/2021 18:54   DG Tibia/Fibula Right  Result Date: 07/10/2021 CLINICAL DATA:  Fall with skin tear EXAM: RIGHT TIBIA AND FIBULA - 2 VIEW COMPARISON:  None. FINDINGS: Right knee replacement. No fracture or malalignment. Edema within the soft tissues. IMPRESSION: Right knee replacement without acute osseous abnormality Electronically Signed   By: Donavan Foil M.D.   On: 07/10/2021 18:51   CT HEAD WO CONTRAST (5MM)  Result Date: 07/10/2021 CLINICAL DATA:  Fall and hit right side with hematoma to eyebrow EXAM: CT HEAD WITHOUT CONTRAST CT MAXILLOFACIAL WITHOUT CONTRAST CT CERVICAL SPINE WITHOUT  CONTRAST TECHNIQUE: Multidetector CT imaging of the head, cervical spine, and maxillofacial structures were performed using the standard protocol without intravenous contrast. Multiplanar CT image reconstructions of the cervical spine and maxillofacial structures were also generated. RADIATION DOSE REDUCTION: This exam was performed according to the departmental dose-optimization program which includes automated exposure control, adjustment of the mA and/or kV according to patient  size and/or use of iterative reconstruction technique. COMPARISON:  CT report 05/05/2019 FINDINGS: CT HEAD FINDINGS Brain: No acute territorial infarction, hemorrhage or intracranial mass. Hypodensity within the periventricular white matter some of which is likely due to chronic small vessel ischemic change though suspect a component of transependymal CSF extravasation. Moderate enlargement of the lateral third and fourth ventricles without significant atrophy Vascular: No hyperdense vessels.  Carotid vascular calcification Skull: Normal. Negative for fracture or focal lesion. Other: Large right forehead and periorbital hematoma CT MAXILLOFACIAL FINDINGS Osseous: Motion degradation limits the exam. Mastoid air cells are clear. Mandibular heads are normally position. No mandibular fracture. Pterygoid plates and zygomatic arches are intact. No acute nasal bone fracture Orbits: Negative. No traumatic or inflammatory finding. Sinuses: Clear. Soft tissues: Moderate right periorbital and forehead hematoma CT CERVICAL SPINE FINDINGS Alignment: Mild reversal of cervical lordosis. Trace anterolisthesis C2 on C3, C3 on C4 and C4 on C5. Facet alignment within normal limits. Skull base and vertebrae: No acute fracture. No primary bone lesion or focal pathologic process. Soft tissues and spinal canal: No prevertebral fluid or swelling. No visible canal hematoma. Disc levels: Multilevel degenerative change with advanced disc space narrowing C5-C6 and  C6-C7. Facet degenerative changes at multiple levels with foraminal stenosis Upper chest: Apical emphysema Other: None IMPRESSION: 1. No CT evidence for acute intracranial abnormality. 2. Moderate enlargement of the ventricles without significant cortical atrophy suggesting normal pressure hydrocephalus. Periventricular white matter hypodensity some of which is felt secondary to chronic small vessel ischemic change though suspect that there is a component of transependymal CSF extravasation. 3. No definite acute facial bone fracture allowing for motion. Moderate to large right periorbital and forehead hematoma 4. Mild reversal of cervical lordosis with degenerative changes. No definite acute fracture 5. Apical emphysema Electronically Signed   By: Donavan Foil M.D.   On: 07/10/2021 18:12   CT Angio Chest PE W and/or Wo Contrast  Result Date: 07/10/2021 CLINICAL DATA:  Positive D-dimer, hypoxia EXAM: CT ANGIOGRAPHY CHEST WITH CONTRAST TECHNIQUE: Multidetector CT imaging of the chest was performed using the standard protocol during bolus administration of intravenous contrast. Multiplanar CT image reconstructions and MIPs were obtained to evaluate the vascular anatomy. RADIATION DOSE REDUCTION: This exam was performed according to the departmental dose-optimization program which includes automated exposure control, adjustment of the mA and/or kV according to patient size and/or use of iterative reconstruction technique. CONTRAST:  53mL OMNIPAQUE IOHEXOL 350 MG/ML SOLN COMPARISON:  04/07/2011 FINDINGS: Cardiovascular: There is homogeneous enhancement in thoracic aorta. There is ectasia of ascending thoracic aorta measuring 4.6 cm. Coronary artery calcifications are seen. There are no intraluminal filling defects in the pulmonary artery branches. There is ectasia of main pulmonary artery measuring 3.6 cm. Mediastinum/Nodes: No significant lymphadenopathy is seen. Lungs/Pleura: Right hemidiaphragm is elevated. There  are linear densities in the right lower lung fields. Subtle increased interstitial markings are seen in the right parahilar region. There are multiple blebs and bullae in the periphery of both lungs. There is no focal pulmonary consolidation. There is no pleural effusion or pneumothorax. Upper Abdomen: There is fatty infiltration in the liver. Calcified granulomas are seen in the spleen. Musculoskeletal: Unremarkable. Review of the MIP images confirms the above findings. IMPRESSION: There is no evidence of pulmonary artery embolism. There is ectasia of main pulmonary artery suggesting pulmonary arterial hypertension. There is no evidence of thoracic aortic dissection. There is ectasia of ascending thoracic aorta. Coronary artery calcifications are seen. Elevation of right hemidiaphragm may be due  to eventration or paralysis. Linear densities in the right lower lung fields suggest scarring or subsegmental atelectasis. Subtle increase in interstitial markings in the right parahilar region may suggest scarring. Less likely possibility would be interstitial pneumonia. Blebs and bullae are seen in the periphery of both lungs. Fatty liver. Electronically Signed   By: Elmer Picker M.D.   On: 07/10/2021 20:17   CT Cervical Spine Wo Contrast  Result Date: 07/10/2021 CLINICAL DATA:  Fall and hit right side with hematoma to eyebrow EXAM: CT HEAD WITHOUT CONTRAST CT MAXILLOFACIAL WITHOUT CONTRAST CT CERVICAL SPINE WITHOUT CONTRAST TECHNIQUE: Multidetector CT imaging of the head, cervical spine, and maxillofacial structures were performed using the standard protocol without intravenous contrast. Multiplanar CT image reconstructions of the cervical spine and maxillofacial structures were also generated. RADIATION DOSE REDUCTION: This exam was performed according to the departmental dose-optimization program which includes automated exposure control, adjustment of the mA and/or kV according to patient size and/or use of  iterative reconstruction technique. COMPARISON:  CT report 05/05/2019 FINDINGS: CT HEAD FINDINGS Brain: No acute territorial infarction, hemorrhage or intracranial mass. Hypodensity within the periventricular white matter some of which is likely due to chronic small vessel ischemic change though suspect a component of transependymal CSF extravasation. Moderate enlargement of the lateral third and fourth ventricles without significant atrophy Vascular: No hyperdense vessels.  Carotid vascular calcification Skull: Normal. Negative for fracture or focal lesion. Other: Large right forehead and periorbital hematoma CT MAXILLOFACIAL FINDINGS Osseous: Motion degradation limits the exam. Mastoid air cells are clear. Mandibular heads are normally position. No mandibular fracture. Pterygoid plates and zygomatic arches are intact. No acute nasal bone fracture Orbits: Negative. No traumatic or inflammatory finding. Sinuses: Clear. Soft tissues: Moderate right periorbital and forehead hematoma CT CERVICAL SPINE FINDINGS Alignment: Mild reversal of cervical lordosis. Trace anterolisthesis C2 on C3, C3 on C4 and C4 on C5. Facet alignment within normal limits. Skull base and vertebrae: No acute fracture. No primary bone lesion or focal pathologic process. Soft tissues and spinal canal: No prevertebral fluid or swelling. No visible canal hematoma. Disc levels: Multilevel degenerative change with advanced disc space narrowing C5-C6 and C6-C7. Facet degenerative changes at multiple levels with foraminal stenosis Upper chest: Apical emphysema Other: None IMPRESSION: 1. No CT evidence for acute intracranial abnormality. 2. Moderate enlargement of the ventricles without significant cortical atrophy suggesting normal pressure hydrocephalus. Periventricular white matter hypodensity some of which is felt secondary to chronic small vessel ischemic change though suspect that there is a component of transependymal CSF extravasation. 3. No  definite acute facial bone fracture allowing for motion. Moderate to large right periorbital and forehead hematoma 4. Mild reversal of cervical lordosis with degenerative changes. No definite acute fracture 5. Apical emphysema Electronically Signed   By: Donavan Foil M.D.   On: 07/10/2021 18:12   DG Hand Complete Right  Result Date: 07/10/2021 CLINICAL DATA:  Fall with skin tear EXAM: RIGHT HAND - COMPLETE 3+ VIEW COMPARISON:  None. FINDINGS: No fracture or malalignment. Advanced arthritis involving the first [facility] joint, STT interval and first and second MCP joints. Advanced arthritis involving the D IP joints with lesser involvement of PIP joints. Chondrocalcinosis. IMPRESSION: Arthritis.  No acute osseous abnormality Electronically Signed   By: Donavan Foil M.D.   On: 07/10/2021 18:53   ECHOCARDIOGRAM COMPLETE  Result Date: 07/11/2021    ECHOCARDIOGRAM REPORT   Patient Name:   [name] Date of Exam: 07/11/2021 Medical Rec #:  [id number]  Height:       71.0 in Accession #:    [id number]        Weight:       287.0 lb Date of Birth:  [birth year]         BSA:          2.459 m Patient Age:    88 years          BP:           122/56 mmHg Patient Gender: M                 HR:           67 bpm. Exam Location:  Inpatient Procedure: 2D Echo, Cardiac Doppler and Color Doppler Indications:    I50.31 CHF  History:        Patient has prior history of Echocardiogram examinations, most                 recent 05/04/2013. Risk Factors:Hypertension. PALPITATION,                 DYSRHYTHMIA/ PVC12/18/14.  Sonographer:    Beryle Beams Referring Phys: [id number] TIMOTHY S OPYD  Sonographer Comments: No subcostal window, patient is morbidly obese, suboptimal parasternal window and suboptimal apical window. Image acquisition challenging due to patient body habitus and Image acquisition challenging due to respiratory motion. [PATIENT UNWILLING TO ROLL ON LT SIDE. IMPRESSIONS  1. Left ventricular ejection fraction, by  estimation, is 60 to 65%. The left ventricle has normal function. The left ventricle has no regional wall motion abnormalities. There is mild left ventricular hypertrophy. Left ventricular diastolic parameters are consistent with Grade II diastolic dysfunction (pseudonormalization).  2. Right ventricular systolic function is normal. The right ventricular size is moderately enlarged.  3. The mitral valve is normal in structure. No evidence of mitral valve regurgitation. No evidence of mitral stenosis.  4. The aortic valve is normal in structure. Aortic valve regurgitation is not visualized. No aortic stenosis is present.  5. The inferior vena cava is normal in size with greater than 50% respiratory variability, suggesting right atrial pressure of 3 mmHg. FINDINGS  Left Ventricle: Left ventricular ejection fraction, by estimation, is 60 to 65%. The left ventricle has normal function. The left ventricle has no regional wall motion abnormalities. The left ventricular internal cavity size was normal in size. There is  mild left ventricular hypertrophy. Left ventricular diastolic parameters are consistent with Grade II diastolic dysfunction (pseudonormalization). Right Ventricle: The right ventricular size is moderately enlarged. No increase in right ventricular wall thickness. Right ventricular systolic function is normal. Left Atrium: Left atrial size was normal in size. Right Atrium: Right atrial size was normal in size. Pericardium: There is no evidence of pericardial effusion. Mitral Valve: The mitral valve is normal in structure. No evidence of mitral valve regurgitation. No evidence of mitral valve stenosis. Tricuspid Valve: The tricuspid valve is normal in structure. Tricuspid valve regurgitation is not demonstrated. No evidence of tricuspid stenosis. Aortic Valve: The aortic valve is normal in structure. Aortic valve regurgitation is not visualized. Aortic regurgitation PHT measures 1136 msec. No aortic stenosis  is present. Aortic valve mean gradient measures 3.0 mmHg. Aortic valve peak gradient measures 6.6 mmHg. Aortic valve area, by VTI measures 2.34 cm. Pulmonic Valve: The pulmonic valve was normal in structure. Pulmonic valve regurgitation is not visualized. No evidence of pulmonic stenosis. Aorta: The aortic root is normal in size and structure. Venous: The inferior vena cava  is normal in size with greater than 50% respiratory variability, suggesting right atrial pressure of 3 mmHg. IAS/Shunts: No atrial level shunt detected by color flow Doppler.  LEFT VENTRICLE PLAX 2D LVIDd:         4.90 cm      Diastology LVIDs:         2.30 cm      LV e' medial:    7.62 cm/s LV PW:         1.40 cm      LV E/e' medial:  10.3 LV IVS:        1.30 cm      LV e' lateral:   11.20 cm/s LVOT diam:     2.10 cm      LV E/e' lateral: 7.0 LV SV:         77 LV SV Index:   31 LVOT Area:     3.46 cm  LV Volumes (MOD) LV vol d, MOD A2C: 82.0 ml LV vol d, MOD A4C: 154.0 ml LV vol s, MOD A2C: 30.5 ml LV vol s, MOD A4C: 74.6 ml LV SV MOD A2C:     51.5 ml LV SV MOD A4C:     154.0 ml LV SV MOD BP:      63.8 ml RIGHT VENTRICLE RV Basal diam:  4.70 cm RV Mid diam:    3.30 cm RV S prime:     12.10 cm/s TAPSE (M-mode): 2.1 cm LEFT ATRIUM             Index        RIGHT ATRIUM           Index LA diam:        3.90 cm 1.59 cm/m   RA Area:     13.10 cm LA Vol (A2C):   89.8 ml 36.53 ml/m  RA Volume:   29.20 ml  11.88 ml/m LA Vol (A4C):   48.0 ml 19.52 ml/m LA Biplane Vol: 66.0 ml 26.85 ml/m  AORTIC VALVE                    PULMONIC VALVE AV Area (Vmax):    2.37 cm     PV Vmax:       0.72 m/s AV Area (Vmean):   2.45 cm     PV Vmean:      42.500 cm/s AV Area (VTI):     2.34 cm     PV VTI:        0.136 m AV Vmax:           128.00 cm/s  PV Peak grad:  2.1 mmHg AV Vmean:          88.500 cm/s  PV Mean grad:  1.0 mmHg AV VTI:            0.329 m AV Peak Grad:      6.6 mmHg AV Mean Grad:      3.0 mmHg LVOT Vmax:         87.70 cm/s LVOT Vmean:        62.700  cm/s LVOT VTI:          0.222 m LVOT/AV VTI ratio: 0.67 AI PHT:            1136 msec  AORTA Ao Root diam: 3.60 cm Ao Asc diam:  3.90 cm MITRAL VALVE               TRICUSPID VALVE MV Area (  PHT): 2.73 cm    TR Peak grad:   28.5 mmHg MV Decel Time: 278 msec    TR Mean grad:   19.0 mmHg MV E velocity: 78.80 cm/s  TR Vmax:        267.00 cm/s MV A velocity: 61.80 cm/s  TR Vmean:       208.0 cm/s MV E/A ratio:  1.28                            SHUNTS                            Systemic VTI:  0.22 m                            Systemic Diam: 2.10 cm Candee Furbish MD Electronically signed by Candee Furbish MD Signature Date/Time: 07/11/2021/4:09:14 PM    Final    VAS [place] LOWER EXTREMITY VENOUS (DVT)  Result Date: 07/11/2021  Lower Venous DVT Study Patient Name:  RICHARDSON DUBREE  Date of Exam:   07/11/2021 Medical Rec #: [id number]          Accession #:    [id number] Date of Birth: 1932-10-25          Patient Gender: M Patient Age:   82 years Exam Location:  [facility] Procedure:      VAS [place] LOWER EXTREMITY VENOUS (DVT) Referring Phys: TIMOTHY OPYD --------------------------------------------------------------------------------  Indications: Pain, Swelling, and Erythema.  Limitations: Body habitus and poor ultrasound/tissue interface. Comparison Study: Previous exam on 10/15/2019 was negative for DVT. Performing Technologist: Rogelia Rohrer RVT, RDMS  Examination Guidelines: A complete evaluation includes B-mode imaging, spectral Doppler, color Doppler, and power Doppler as needed of all accessible portions of each vessel. Bilateral testing is considered an integral part of a complete examination. Limited examinations for reoccurring indications may be performed as noted. The reflux portion of the exam is performed with the patient in reverse Trendelenburg.  +---------+---------------+---------+-----------+----------+-------------------+  RIGHT     Compressibility Phasicity Spontaneity Properties Thrombus Aging        +---------+---------------+---------+-----------+----------+-------------------+  CFV       Full            Yes       Yes                                         +---------+---------------+---------+-----------+----------+-------------------+  SFJ       Full                                                                  +---------+---------------+---------+-----------+----------+-------------------+  FV Prox   Full            Yes       Yes                                         +---------+---------------+---------+-----------+----------+-------------------+  FV Mid    Full  Yes       Yes                                         +---------+---------------+---------+-----------+----------+-------------------+  FV Distal Full            Yes       Yes                                         +---------+---------------+---------+-----------+----------+-------------------+  PFV       Full                                                                  +---------+---------------+---------+-----------+----------+-------------------+  POP       Full            Yes       Yes                                         +---------+---------------+---------+-----------+----------+-------------------+  PTV       Full                                                                  +---------+---------------+---------+-----------+----------+-------------------+  PERO                                                       Not well visualized  +---------+---------------+---------+-----------+----------+-------------------+   +----+---------------+---------+-----------+----------+--------------+  LEFT Compressibility Phasicity Spontaneity Properties Thrombus Aging  +----+---------------+---------+-----------+----------+--------------+  CFV  Full            Yes       Yes                                    +----+---------------+---------+-----------+----------+--------------+     Summary: RIGHT: - No evidence of common femoral  vein obstruction.  LEFT: - There is no evidence of deep vein thrombosis in the lower extremity. - There is no evidence of superficial venous thrombosis.  - No cystic structure found in the popliteal fossa.  *See table(s) above for measurements and observations. Electronically signed by Monica Martinez MD on 07/11/2021 at 2:30:44 PM.    Final    CT Maxillofacial Wo Contrast  Result Date: 07/10/2021 CLINICAL DATA:  Fall and hit right side with hematoma to eyebrow EXAM: CT HEAD WITHOUT CONTRAST CT MAXILLOFACIAL WITHOUT CONTRAST CT CERVICAL SPINE WITHOUT CONTRAST TECHNIQUE: Multidetector CT imaging of the head, cervical spine, and maxillofacial structures were performed using the standard protocol without intravenous contrast. Multiplanar CT image reconstructions of  the cervical spine and maxillofacial structures were also generated. RADIATION DOSE REDUCTION: This exam was performed according to the departmental dose-optimization program which includes automated exposure control, adjustment of the mA and/or kV according to patient size and/or use of iterative reconstruction technique. COMPARISON:  CT report 05/05/2019 FINDINGS: CT HEAD FINDINGS Brain: No acute territorial infarction, hemorrhage or intracranial mass. Hypodensity within the periventricular white matter some of which is likely due to chronic small vessel ischemic change though suspect a component of transependymal CSF extravasation. Moderate enlargement of the lateral third and fourth ventricles without significant atrophy Vascular: No hyperdense vessels.  Carotid vascular calcification Skull: Normal. Negative for fracture or focal lesion. Other: Large right forehead and periorbital hematoma CT MAXILLOFACIAL FINDINGS Osseous: Motion degradation limits the exam. Mastoid air cells are clear. Mandibular heads are normally position. No mandibular fracture. Pterygoid plates and zygomatic arches are intact. No acute nasal bone fracture Orbits: Negative. No  traumatic or inflammatory finding. Sinuses: Clear. Soft tissues: Moderate right periorbital and forehead hematoma CT CERVICAL SPINE FINDINGS Alignment: Mild reversal of cervical lordosis. Trace anterolisthesis C2 on C3, C3 on C4 and C4 on C5. Facet alignment within normal limits. Skull base and vertebrae: No acute fracture. No primary bone lesion or focal pathologic process. Soft tissues and spinal canal: No prevertebral fluid or swelling. No visible canal hematoma. Disc levels: Multilevel degenerative change with advanced disc space narrowing C5-C6 and C6-C7. Facet degenerative changes at multiple levels with foraminal stenosis Upper chest: Apical emphysema Other: None IMPRESSION: 1. No CT evidence for acute intracranial abnormality. 2. Moderate enlargement of the ventricles without significant cortical atrophy suggesting normal pressure hydrocephalus. Periventricular white matter hypodensity some of which is felt secondary to chronic small vessel ischemic change though suspect that there is a component of transependymal CSF extravasation. 3. No definite acute facial bone fracture allowing for motion. Moderate to large right periorbital and forehead hematoma 4. Mild reversal of cervical lordosis with degenerative changes. No definite acute fracture 5. Apical emphysema Electronically Signed   By: Donavan Foil M.D.   On: 07/10/2021 18:12    Microbiology: Results for orders placed or performed during the hospital encounter of 07/10/21  Resp Panel by RT-PCR (Flu A&B, Covid) Nasopharyngeal Swab     Status: None   Collection Time: 07/10/21  6:25 PM   Specimen: Nasopharyngeal Swab; Nasopharyngeal(NP) swabs in vial transport medium  Result Value Ref Range Status   SARS Coronavirus 2 by RT PCR NEGATIVE NEGATIVE Final    Comment: (NOTE) SARS-CoV-2 target nucleic acids are NOT DETECTED.  The SARS-CoV-2 RNA is generally detectable in upper respiratory specimens during the acute phase of infection. The  lowest concentration of SARS-CoV-2 viral copies this assay can detect is 138 copies/mL. A negative result does not preclude SARS-Cov-2 infection and should not be used as the sole basis for treatment or other patient management decisions. A negative result may occur with  improper specimen collection/handling, submission of specimen other than nasopharyngeal swab, presence of viral mutation(s) within the areas targeted by this assay, and inadequate number of viral copies(<138 copies/mL). A negative result must be combined with clinical observations, patient history, and epidemiological information. The expected result is Negative.  Fact Sheet for Patients:  EntrepreneurPulse.com.au  Fact Sheet for Healthcare Providers:  IncredibleEmployment.be  This test is no t yet approved or cleared by the [place] FDA and  has been authorized for detection and/or diagnosis of SARS-CoV-2 by FDA under an Emergency Use Authorization (EUA). This EUA will remain  in effect (meaning this test can be used) for the duration of the COVID-19 declaration under Section 564(b)(1) of the Act, 21 U.S.C.section 360bbb-3(b)(1), unless the authorization is terminated  or revoked sooner.       Influenza A by PCR NEGATIVE NEGATIVE Final   Influenza B by PCR NEGATIVE NEGATIVE Final    Comment: (NOTE) The Xpert Xpress SARS-CoV-2/FLU/RSV plus assay is intended as an aid in the diagnosis of influenza from Nasopharyngeal swab specimens and should not be used as a sole basis for treatment. Nasal washings and aspirates are unacceptable for Xpert Xpress SARS-CoV-2/FLU/RSV testing.  Fact Sheet for Patients: EntrepreneurPulse.com.au  Fact Sheet for Healthcare Providers: IncredibleEmployment.be  This test is not yet approved or cleared by the [place] FDA and has been authorized for detection and/or diagnosis of SARS-CoV-2 by FDA under  an Emergency Use Authorization (EUA). This EUA will remain in effect (meaning this test can be used) for the duration of the COVID-19 declaration under Section 564(b)(1) of the Act, 21 U.S.C. section 360bbb-3(b)(1), unless the authorization is terminated or revoked.  Performed at [facility], [place] [address] [place], [place] [address]   Resp Panel by RT-PCR (Flu A&B, Covid) Nasopharyngeal Swab     Status: None   Collection Time: 07/16/21  9:43 AM   Specimen: Nasopharyngeal Swab; Nasopharyngeal(NP) swabs in vial transport medium  Result Value Ref Range Status   SARS Coronavirus 2 by RT PCR NEGATIVE NEGATIVE Final    Comment: (NOTE) SARS-CoV-2 target nucleic acids are NOT DETECTED.  The SARS-CoV-2 RNA is generally detectable in upper respiratory specimens during the acute phase of infection. The lowest concentration of SARS-CoV-2 viral copies this assay can detect is 138 copies/mL. A negative result does not preclude SARS-Cov-2 infection and should not be used as the sole basis for treatment or other patient management decisions. A negative result may occur with  improper specimen collection/handling, submission of specimen other than nasopharyngeal swab, presence of viral mutation(s) within the areas targeted by this assay, and inadequate number of viral copies(<138 copies/mL). A negative result must be combined with clinical observations, patient history, and epidemiological information. The expected result is Negative.  Fact Sheet for Patients:  EntrepreneurPulse.com.au  Fact Sheet for Healthcare Providers:  IncredibleEmployment.be  This test is no t yet approved or cleared by the [place] FDA and  has been authorized for detection and/or diagnosis of SARS-CoV-2 by FDA under an Emergency Use Authorization (EUA). This EUA will remain  in effect (meaning this test can be used) for the duration of the COVID-19  declaration under Section 564(b)(1) of the Act, 21 U.S.C.section 360bbb-3(b)(1), unless the authorization is terminated  or revoked sooner.       Influenza A by PCR NEGATIVE NEGATIVE Final   Influenza B by PCR NEGATIVE NEGATIVE Final    Comment: (NOTE) The Xpert Xpress SARS-CoV-2/FLU/RSV plus assay is intended as an aid in the diagnosis of influenza from Nasopharyngeal swab specimens and should not be used as a sole basis for treatment. Nasal washings and aspirates are unacceptable for Xpert Xpress SARS-CoV-2/FLU/RSV testing.  Fact Sheet for Patients: EntrepreneurPulse.com.au  Fact Sheet for Healthcare Providers: IncredibleEmployment.be  This test is not yet approved or cleared by the [place] FDA and has been authorized for detection and/or diagnosis of SARS-CoV-2 by FDA under an Emergency Use Authorization (EUA). This EUA will remain in effect (meaning this test can be used) for the duration of the COVID-19 declaration under Section 564(b)(1) of the Act, 21  U.S.C. section 360bbb-3(b)(1), unless the authorization is terminated or revoked.  Performed at [facility], [place] [address] [place], [place] [address]     Labs: CBC: Recent Labs  Lab 07/10/21 1824 07/11/21 0556 07/16/21 0757  WBC 15.3* 11.1* 7.7  NEUTROABS 12.2*  --   --   HGB 11.5* 10.6* 12.6*  HCT 33.4* 30.9* 37.8*  MCV 103.7* 103.7* 105.3*  PLT 210 187 734   Basic Metabolic Panel: Recent Labs  Lab 07/11/21 0556 07/12/21 0546 07/13/21 0939 07/14/21 1149 07/15/21 0515 07/16/21 0757  NA 133* 132* 132* 131* 133* 131*  K 4.5 4.0 3.9 3.6 3.7 3.7  CL 100 94* 92* 90* 89* 88*  CO2 26 30 33* 35* 34* 32  GLUCOSE 108* 99 107* 113* 98 117*  BUN 27* 22 26* 24* 25* 27*  CREATININE 1.20 1.13 1.18 1.12 1.28* 1.19  CALCIUM 8.7* 9.0 8.9 8.6* 9.1 9.1  MG 2.1  --   --   --   --  2.0  PHOS  --   --   --   --   --  3.2   Liver Function Tests: Recent  Labs  Lab 07/10/21 1824 07/11/21 0556 07/14/21 1149 07/15/21 0515 07/16/21 0757  AST 29 20 19 25   --   ALT 45* 38 28 33  --   ALKPHOS 84 69 60 68  --   BILITOT 0.8 0.6 0.6 0.8  --   PROT 7.1 6.3* 6.9 7.1  --   ALBUMIN 2.9* 2.7* 2.8* 3.0* 3.0*   CBG: No results for input(s): GLUCAP in the last 168 hours.  Discharge time spent: greater than 30 minutes.  Signed: Mercy Riding, MD [facility] 07/16/2021

## 2021-07-16 NOTE — Progress Notes (Signed)
Report called to Webb Silversmith, Therapist, sports at [facility].  ?

## 2021-07-16 NOTE — Progress Notes (Signed)
Pt discharged to SNF with all belongings. Discharge education completed and placed in discharge packet.  ?

## 2021-07-16 NOTE — Progress Notes (Signed)
SATURATION QUALIFICATIONS: (This note is used to comply with regulatory documentation for home oxygen) ? ?Patient Saturations on Room Air at Rest = 91% ? ?Patient Saturations on Room Air while Ambulating = n/a% ? ?Patient Saturations on 0 Liters of oxygen while Ambulating = n/a% ? ?Please briefly explain why patient needs home oxygen: ? ?Pt unable to safely ambulate at this time per PT.  ?

## 2021-07-16 NOTE — TOC Transition Note (Signed)
Transition of Care (TOC) - CM/SW Discharge Note ? ? ?Patient Details  ?Name: [name] ?MRN: [id number] ?Date of Birth: 12/19/1932 ? ?Transition of Care (TOC) CM/SW Contact:  ?Arnella Pralle, Marjie Skiff, RN ?Phone Number: ?07/16/2021, 11:11 AM ? ? ?Clinical Narrative:    ?Pt to dc to [facility] today via [facility]. Yellow DNR on the shadow chart for transport. He will go to room 502 at [facility] and RN to call report to [phone]. ? ? ?Final next level of care: [place] ?Barriers to Discharge: Continued Medical Work up ? ? ?Patient Goals and CMS Choice ?Patient states their goals for this hospitalization and ongoing recovery are:: Home ?CMS Medicare.gov Compare Post Acute Care list provided to:: Patient ?Choice offered to / list presented to : Patient ? ?Discharge Plan and Services ?  ?Discharge Planning Services: CM Consult ?Post Acute Care Choice: Home Health          ?  ?  ?  ?  ?  ?HH Arranged: RN, OT, Nurse's Aide, Social Work ?[place] Agency: [place] ?Date HH Agency Contacted: 07/14/21 ?Time [place]: 9528 ?Representative spoke with at [place]: [place] ? ?Social Determinants of Health (SDOH) Interventions ?  ? ? ?Readmission Risk Interventions ?No flowsheet data found. ? ? ? ? ?

## 2021-07-17 DIAGNOSIS — Z9181 History of falling: Secondary | ICD-10-CM | POA: Diagnosis not present

## 2021-07-17 DIAGNOSIS — M6281 Muscle weakness (generalized): Secondary | ICD-10-CM | POA: Diagnosis not present

## 2021-07-17 DIAGNOSIS — R2689 Other abnormalities of gait and mobility: Secondary | ICD-10-CM | POA: Diagnosis not present

## 2021-07-17 DIAGNOSIS — R52 Pain, unspecified: Secondary | ICD-10-CM | POA: Diagnosis not present

## 2021-07-17 DIAGNOSIS — F32A Depression, unspecified: Secondary | ICD-10-CM | POA: Diagnosis not present

## 2021-07-21 DIAGNOSIS — R5381 Other malaise: Secondary | ICD-10-CM | POA: Diagnosis not present

## 2021-07-21 DIAGNOSIS — I10 Essential (primary) hypertension: Secondary | ICD-10-CM | POA: Diagnosis not present

## 2021-07-21 DIAGNOSIS — R42 Dizziness and giddiness: Secondary | ICD-10-CM | POA: Diagnosis not present

## 2021-07-21 DIAGNOSIS — F411 Generalized anxiety disorder: Secondary | ICD-10-CM | POA: Diagnosis not present

## 2021-07-21 DIAGNOSIS — W19XXXS Unspecified fall, sequela: Secondary | ICD-10-CM | POA: Diagnosis not present

## 2021-07-21 DIAGNOSIS — F32A Depression, unspecified: Secondary | ICD-10-CM | POA: Diagnosis not present

## 2021-07-23 DIAGNOSIS — L97919 Non-pressure chronic ulcer of unspecified part of right lower leg with unspecified severity: Secondary | ICD-10-CM | POA: Diagnosis not present

## 2021-07-23 DIAGNOSIS — E871 Hypo-osmolality and hyponatremia: Secondary | ICD-10-CM | POA: Diagnosis not present

## 2021-07-23 DIAGNOSIS — R0989 Other specified symptoms and signs involving the circulatory and respiratory systems: Secondary | ICD-10-CM | POA: Diagnosis not present

## 2021-07-23 DIAGNOSIS — N179 Acute kidney failure, unspecified: Secondary | ICD-10-CM | POA: Diagnosis not present

## 2021-07-23 DIAGNOSIS — E860 Dehydration: Secondary | ICD-10-CM | POA: Diagnosis not present

## 2021-07-23 DIAGNOSIS — R051 Acute cough: Secondary | ICD-10-CM | POA: Diagnosis not present

## 2021-07-23 DIAGNOSIS — W19XXXS Unspecified fall, sequela: Secondary | ICD-10-CM | POA: Diagnosis not present

## 2021-07-29 DIAGNOSIS — W19XXXS Unspecified fall, sequela: Secondary | ICD-10-CM | POA: Diagnosis not present

## 2021-07-29 DIAGNOSIS — J9601 Acute respiratory failure with hypoxia: Secondary | ICD-10-CM | POA: Diagnosis not present

## 2021-07-29 DIAGNOSIS — F32A Depression, unspecified: Secondary | ICD-10-CM | POA: Diagnosis not present

## 2021-07-29 DIAGNOSIS — R5383 Other fatigue: Secondary | ICD-10-CM | POA: Diagnosis not present

## 2021-07-29 DIAGNOSIS — F411 Generalized anxiety disorder: Secondary | ICD-10-CM | POA: Diagnosis not present

## 2021-07-30 ENCOUNTER — Inpatient Hospital Stay (HOSPITAL_COMMUNITY)
Admission: EM | Admit: 2021-07-30 | Discharge: 2021-08-05 | DRG: 603 | Disposition: A | Discharge status: Skilled Nursing Facility | Payer: Medicare PPO | Source: Skilled Nursing Facility | Attending: Family Medicine | Admitting: Family Medicine

## 2021-07-30 ENCOUNTER — Emergency Department (HOSPITAL_COMMUNITY): Payer: Medicare PPO

## 2021-07-30 ENCOUNTER — Other Ambulatory Visit: Payer: Self-pay

## 2021-07-30 ENCOUNTER — Observation Stay (HOSPITAL_COMMUNITY): Payer: Medicare PPO

## 2021-07-30 ENCOUNTER — Encounter (HOSPITAL_COMMUNITY): Payer: Self-pay | Admitting: Emergency Medicine

## 2021-07-30 DIAGNOSIS — Z885 Allergy status to narcotic agent status: Secondary | ICD-10-CM

## 2021-07-30 DIAGNOSIS — Z96642 Presence of left artificial hip joint: Secondary | ICD-10-CM | POA: Diagnosis present

## 2021-07-30 DIAGNOSIS — E860 Dehydration: Secondary | ICD-10-CM | POA: Diagnosis present

## 2021-07-30 DIAGNOSIS — N390 Urinary tract infection, site not specified: Secondary | ICD-10-CM | POA: Diagnosis present

## 2021-07-30 DIAGNOSIS — L0390 Cellulitis, unspecified: Secondary | ICD-10-CM | POA: Diagnosis present

## 2021-07-30 DIAGNOSIS — E669 Obesity, unspecified: Secondary | ICD-10-CM | POA: Diagnosis present

## 2021-07-30 DIAGNOSIS — R0902 Hypoxemia: Secondary | ICD-10-CM | POA: Diagnosis not present

## 2021-07-30 DIAGNOSIS — I5032 Chronic diastolic (congestive) heart failure: Secondary | ICD-10-CM | POA: Diagnosis present

## 2021-07-30 DIAGNOSIS — R5383 Other fatigue: Secondary | ICD-10-CM | POA: Diagnosis not present

## 2021-07-30 DIAGNOSIS — R410 Disorientation, unspecified: Secondary | ICD-10-CM | POA: Diagnosis not present

## 2021-07-30 DIAGNOSIS — K5900 Constipation, unspecified: Secondary | ICD-10-CM | POA: Diagnosis present

## 2021-07-30 DIAGNOSIS — I110 Hypertensive heart disease with heart failure: Secondary | ICD-10-CM | POA: Diagnosis present

## 2021-07-30 DIAGNOSIS — Z6834 Body mass index (BMI) 34.0-34.9, adult: Secondary | ICD-10-CM

## 2021-07-30 DIAGNOSIS — Z888 Allergy status to other drugs, medicaments and biological substances status: Secondary | ICD-10-CM | POA: Diagnosis not present

## 2021-07-30 DIAGNOSIS — Z8249 Family history of ischemic heart disease and other diseases of the circulatory system: Secondary | ICD-10-CM | POA: Diagnosis not present

## 2021-07-30 DIAGNOSIS — Z7401 Bed confinement status: Secondary | ICD-10-CM | POA: Diagnosis not present

## 2021-07-30 DIAGNOSIS — I10 Essential (primary) hypertension: Secondary | ICD-10-CM | POA: Diagnosis not present

## 2021-07-30 DIAGNOSIS — N3001 Acute cystitis with hematuria: Secondary | ICD-10-CM | POA: Diagnosis not present

## 2021-07-30 DIAGNOSIS — M1990 Unspecified osteoarthritis, unspecified site: Secondary | ICD-10-CM | POA: Diagnosis present

## 2021-07-30 DIAGNOSIS — I959 Hypotension, unspecified: Secondary | ICD-10-CM | POA: Diagnosis present

## 2021-07-30 DIAGNOSIS — E875 Hyperkalemia: Secondary | ICD-10-CM

## 2021-07-30 DIAGNOSIS — I5033 Acute on chronic diastolic (congestive) heart failure: Secondary | ICD-10-CM | POA: Diagnosis not present

## 2021-07-30 DIAGNOSIS — Z20822 Contact with and (suspected) exposure to covid-19: Secondary | ICD-10-CM | POA: Diagnosis present

## 2021-07-30 DIAGNOSIS — R1084 Generalized abdominal pain: Secondary | ICD-10-CM | POA: Diagnosis not present

## 2021-07-30 DIAGNOSIS — R2681 Unsteadiness on feet: Secondary | ICD-10-CM | POA: Diagnosis not present

## 2021-07-30 DIAGNOSIS — N281 Cyst of kidney, acquired: Secondary | ICD-10-CM | POA: Diagnosis not present

## 2021-07-30 DIAGNOSIS — E785 Hyperlipidemia, unspecified: Secondary | ICD-10-CM | POA: Diagnosis present

## 2021-07-30 DIAGNOSIS — N400 Enlarged prostate without lower urinary tract symptoms: Secondary | ICD-10-CM | POA: Diagnosis present

## 2021-07-30 DIAGNOSIS — L120 Bullous pemphigoid: Secondary | ICD-10-CM | POA: Diagnosis present

## 2021-07-30 DIAGNOSIS — J9601 Acute respiratory failure with hypoxia: Secondary | ICD-10-CM | POA: Diagnosis not present

## 2021-07-30 DIAGNOSIS — E871 Hypo-osmolality and hyponatremia: Secondary | ICD-10-CM | POA: Diagnosis present

## 2021-07-30 DIAGNOSIS — N3000 Acute cystitis without hematuria: Principal | ICD-10-CM

## 2021-07-30 DIAGNOSIS — I952 Hypotension due to drugs: Secondary | ICD-10-CM | POA: Diagnosis not present

## 2021-07-30 DIAGNOSIS — L03115 Cellulitis of right lower limb: Secondary | ICD-10-CM | POA: Diagnosis present

## 2021-07-30 DIAGNOSIS — R059 Cough, unspecified: Secondary | ICD-10-CM | POA: Diagnosis not present

## 2021-07-30 DIAGNOSIS — Z87891 Personal history of nicotine dependence: Secondary | ICD-10-CM

## 2021-07-30 DIAGNOSIS — R531 Weakness: Secondary | ICD-10-CM | POA: Diagnosis not present

## 2021-07-30 DIAGNOSIS — Z96653 Presence of artificial knee joint, bilateral: Secondary | ICD-10-CM | POA: Diagnosis present

## 2021-07-30 DIAGNOSIS — Z96612 Presence of left artificial shoulder joint: Secondary | ICD-10-CM | POA: Diagnosis present

## 2021-07-30 DIAGNOSIS — Z7982 Long term (current) use of aspirin: Secondary | ICD-10-CM

## 2021-07-30 DIAGNOSIS — R1312 Dysphagia, oropharyngeal phase: Secondary | ICD-10-CM | POA: Diagnosis not present

## 2021-07-30 DIAGNOSIS — M6281 Muscle weakness (generalized): Secondary | ICD-10-CM | POA: Diagnosis not present

## 2021-07-30 DIAGNOSIS — Z79899 Other long term (current) drug therapy: Secondary | ICD-10-CM | POA: Diagnosis not present

## 2021-07-30 DIAGNOSIS — N179 Acute kidney failure, unspecified: Secondary | ICD-10-CM

## 2021-07-30 LAB — URINALYSIS, ROUTINE W REFLEX MICROSCOPIC
Bilirubin Urine: NEGATIVE
Glucose, UA: NEGATIVE mg/dL
Ketones, ur: NEGATIVE mg/dL
Nitrite: POSITIVE — AB
Protein, ur: NEGATIVE mg/dL
Specific Gravity, Urine: 1.01 (ref 1.005–1.030)
pH: 7.5 (ref 5.0–8.0)

## 2021-07-30 LAB — CBC WITH DIFFERENTIAL/PLATELET
Abs Immature Granulocytes: 0.08 10*3/uL — ABNORMAL HIGH (ref 0.00–0.07)
Basophils Absolute: 0 10*3/uL (ref 0.0–0.1)
Basophils Relative: 0 %
Eosinophils Absolute: 0.2 10*3/uL (ref 0.0–0.5)
Eosinophils Relative: 2 %
HCT: 33.6 % — ABNORMAL LOW (ref 39.0–52.0)
Hemoglobin: 11.1 g/dL — ABNORMAL LOW (ref 13.0–17.0)
Immature Granulocytes: 1 %
Lymphocytes Relative: 9 %
Lymphs Abs: 0.8 10*3/uL (ref 0.7–4.0)
MCH: 34.5 pg — ABNORMAL HIGH (ref 26.0–34.0)
MCHC: 33 g/dL (ref 30.0–36.0)
MCV: 104.3 fL — ABNORMAL HIGH (ref 80.0–100.0)
Monocytes Absolute: 0.5 10*3/uL (ref 0.1–1.0)
Monocytes Relative: 5 %
Neutro Abs: 7.3 10*3/uL (ref 1.7–7.7)
Neutrophils Relative %: 83 %
Platelets: 243 10*3/uL (ref 150–400)
RBC: 3.22 MIL/uL — ABNORMAL LOW (ref 4.22–5.81)
RDW: 13.1 % (ref 11.5–15.5)
WBC: 8.8 10*3/uL (ref 4.0–10.5)
nRBC: 0 % (ref 0.0–0.2)

## 2021-07-30 LAB — URINALYSIS, MICROSCOPIC (REFLEX)
Bacteria, UA: NONE SEEN
Squamous Epithelial / HPF: NONE SEEN (ref 0–5)

## 2021-07-30 LAB — COMPREHENSIVE METABOLIC PANEL
ALT: 26 U/L (ref 0–44)
AST: 35 U/L (ref 15–41)
Albumin: 2.7 g/dL — ABNORMAL LOW (ref 3.5–5.0)
Alkaline Phosphatase: 63 U/L (ref 38–126)
Anion gap: 7 (ref 5–15)
BUN: 43 mg/dL — ABNORMAL HIGH (ref 8–23)
CO2: 33 mmol/L — ABNORMAL HIGH (ref 22–32)
Calcium: 9 mg/dL (ref 8.9–10.3)
Chloride: 89 mmol/L — ABNORMAL LOW (ref 98–111)
Creatinine, Ser: 1.58 mg/dL — ABNORMAL HIGH (ref 0.61–1.24)
GFR, Estimated: 42 mL/min — ABNORMAL LOW (ref >60)
Glucose, Bld: 113 mg/dL — ABNORMAL HIGH (ref 70–99)
Potassium: 5.2 mmol/L — ABNORMAL HIGH (ref 3.5–5.1)
Sodium: 129 mmol/L — ABNORMAL LOW (ref 135–145)
Total Bilirubin: 1.8 mg/dL — ABNORMAL HIGH (ref 0.3–1.2)
Total Protein: 6.9 g/dL (ref 6.5–8.1)

## 2021-07-30 LAB — LACTIC ACID, PLASMA: Lactic Acid, Venous: 1 mmol/L (ref 0.5–1.9)

## 2021-07-30 LAB — CBG MONITORING, ED: Glucose-Capillary: 127 mg/dL — ABNORMAL HIGH (ref 70–99)

## 2021-07-30 LAB — RESP PANEL BY RT-PCR (FLU A&B, COVID) ARPGX2
Influenza A by PCR: NEGATIVE
Influenza B by PCR: NEGATIVE
SARS Coronavirus 2 by RT PCR: NEGATIVE

## 2021-07-30 LAB — BRAIN NATRIURETIC PEPTIDE: B Natriuretic Peptide: 185.8 pg/mL — ABNORMAL HIGH (ref 0.0–100.0)

## 2021-07-30 LAB — TROPONIN I (HIGH SENSITIVITY): Troponin I (High Sensitivity): 3 ng/L (ref <18)

## 2021-07-30 MED ORDER — ATORVASTATIN CALCIUM 10 MG PO TABS
10.0000 mg | ORAL_TABLET | Freq: Every day | ORAL | Status: DC
  Administered 2021-07-31 – 2021-08-05 (×6): 10 mg via ORAL
  Filled 2021-07-30 (×6): qty 1

## 2021-07-30 MED ORDER — ENOXAPARIN SODIUM 40 MG/0.4ML IJ SOSY
40.0000 mg | PREFILLED_SYRINGE | INTRAMUSCULAR | Status: DC
  Administered 2021-07-30 – 2021-08-04 (×6): 40 mg via SUBCUTANEOUS
  Filled 2021-07-30 (×6): qty 0.4

## 2021-07-30 MED ORDER — VITAMIN D3 25 MCG (1000 UNIT) PO TABS
5000.0000 [IU] | ORAL_TABLET | Freq: Every day | ORAL | Status: DC
  Administered 2021-07-31 – 2021-08-05 (×6): 5000 [IU] via ORAL
  Filled 2021-07-30 (×7): qty 5

## 2021-07-30 MED ORDER — FINASTERIDE 5 MG PO TABS
5.0000 mg | ORAL_TABLET | Freq: Every day | ORAL | Status: DC
  Administered 2021-07-31 – 2021-08-05 (×6): 5 mg via ORAL
  Filled 2021-07-30 (×6): qty 1

## 2021-07-30 MED ORDER — LACTATED RINGERS IV SOLN: Freq: Once | INTRAVENOUS | Status: AC

## 2021-07-30 MED ORDER — ACETAMINOPHEN 650 MG RE SUPP
650.0000 mg | Freq: Four times a day (QID) | RECTAL | Status: DC | PRN
Start: 2021-07-30 — End: 2021-08-05

## 2021-07-30 MED ORDER — CYCLOSPORINE 0.05 % OP EMUL
1.0000 [drp] | Freq: Two times a day (BID) | OPHTHALMIC | Status: DC
  Administered 2021-08-03 – 2021-08-05 (×4): 1 [drp] via OPHTHALMIC
  Filled 2021-07-30 (×13): qty 30

## 2021-07-30 MED ORDER — LACTATED RINGERS IV SOLN: Freq: Once | INTRAVENOUS | Status: DC

## 2021-07-30 MED ORDER — MONTELUKAST SODIUM 10 MG PO TABS
10.0000 mg | ORAL_TABLET | Freq: Every day | ORAL | Status: DC
  Administered 2021-07-31 – 2021-08-05 (×6): 10 mg via ORAL
  Filled 2021-07-30 (×6): qty 1

## 2021-07-30 MED ORDER — GABAPENTIN 100 MG PO CAPS
100.0000 mg | ORAL_CAPSULE | Freq: Two times a day (BID) | ORAL | Status: DC
  Administered 2021-07-30 – 2021-08-05 (×12): 100 mg via ORAL
  Filled 2021-07-30 (×12): qty 1

## 2021-07-30 MED ORDER — ONDANSETRON HCL 4 MG PO TABS
4.0000 mg | ORAL_TABLET | Freq: Four times a day (QID) | ORAL | Status: DC | PRN
  Administered 2021-08-02 (×2): 4 mg via ORAL
  Filled 2021-07-30 (×2): qty 1

## 2021-07-30 MED ORDER — SODIUM CHLORIDE 0.9 % IV SOLN
2.0000 g | INTRAVENOUS | Status: AC
  Administered 2021-07-30 – 2021-08-03 (×5): 2 g via INTRAVENOUS
  Filled 2021-07-30 (×5): qty 20

## 2021-07-30 MED ORDER — LACTATED RINGERS IV SOLN
INTRAVENOUS | Status: DC
Start: 2021-07-30 — End: 2021-07-30

## 2021-07-30 MED ORDER — ACETAMINOPHEN 325 MG PO TABS
650.0000 mg | ORAL_TABLET | Freq: Four times a day (QID) | ORAL | Status: DC | PRN
  Administered 2021-07-31 – 2021-08-04 (×5): 650 mg via ORAL
  Filled 2021-07-30 (×5): qty 2

## 2021-07-30 MED ORDER — ONDANSETRON HCL 4 MG/2ML IJ SOLN
4.0000 mg | Freq: Four times a day (QID) | INTRAMUSCULAR | Status: DC | PRN
  Administered 2021-08-01 – 2021-08-02 (×2): 4 mg via INTRAVENOUS
  Filled 2021-07-30 (×2): qty 2

## 2021-07-30 MED ORDER — CYCLOSPORINE 0.05 % OP EMUL: 1.0000 [drp] | Freq: Two times a day (BID) | OPHTHALMIC | Status: DC | PRN

## 2021-07-30 NOTE — H&P (Signed)
?History and Physical  ? ? ?Patient: [name] DOB: August 22, 1932 ?DOA: 07/30/2021 ?DOS: the patient was seen and examined on 07/30/2021 ?PCP: Lajean Manes, MD  ?Patient coming from: SNF ? ?Chief Complaint:  ?Chief Complaint  ?Patient presents with  ? Weakness  ? Hypotension  ? ?HPI: [name] is a 88 y.o. male with medical history significant of chronic diastolic HF, HTN, bullous pemphigoid, asthma, BPH, HLD. Presenting with hypotension. History is from chart review as the patient is not sure why he was brought to the hospital. He was recently admission for acute on chronic diastolic HF. He was diuresed well. His weight went from 287 down to 252lbs at discharge. He was sent to SNF without incident. At discharge, he was placed on daily lasix '40mg'$ . He apparently has been weak at the facility. When his BP was assessed today, they found that he was low (72/45). EMS was called and he was brought to the ED for evaluation.  ? ? ?Review of Systems: As mentioned in the history of present illness. All other systems reviewed and are negative. ?Past Medical History:  ?Diagnosis Date  ? Anxiety   ? "pt. denies" 10-11-12  ? Arthritis   ? degenerative arthritis   ? Arthritis, lumbar spine   ? BPH (benign prostatic hyperplasia)   ? Dysrhythmia   ? 1st degree heart block  palpitations 04/07/2011, seen & complete eval. /w Dr. Radford Pax- stress, echo, holter monitor, has been cleared for surg.   ? Edema of both legs   ? improved with use of meds/ compression socks used.  ? Heart palpitations 2012  ? Hypertension   ? Pemphigoid   ? PONV (postoperative nausea and vomiting)   ? PVC's (premature ventricular contractions)   ? Spinal stenosis   ? ?Past Surgical History:  ?Procedure Laterality Date  ? BACK SURGERY  01/2011  ? 02/2011  ? JOINT REPLACEMENT    ? R knee replacement-  10/06, L shoulder - 3/07, R hip- replacement- '07  ? TOTAL HIP ARTHROPLASTY  2007  ? right total hip replacement  ? TOTAL HIP ARTHROPLASTY  Left 10/14/2012  ? Procedure: LEFT TOTAL HIP ARTHROPLASTY ANTERIOR APPROACH;  Surgeon: Mcarthur Rossetti, MD;  Location: [facility];  Service: Orthopedics;  Laterality: Left;  ? TOTAL KNEE ARTHROPLASTY  2007  ? right total knee  ? TOTAL KNEE ARTHROPLASTY  06/01/2011  ? Procedure: TOTAL KNEE ARTHROPLASTY;  Surgeon: Lorn Junes, MD;  Location: [facility];  Service: Orthopedics;  Laterality: Left;  ? TOTAL SHOULDER ARTHROPLASTY Left 2007  ? ?Social History:  reports that he quit smoking about 37 years ago. He has never used smokeless tobacco. He reports that he does not drink alcohol and does not use drugs. ? ?Allergies  ?Allergen Reactions  ? Spironolactone Nausea Only  ? Hydrocodone Nausea And Vomiting  ? ? ?Family History  ?Problem Relation Age of Onset  ? Heart attack Mother   ? Hypertension Mother   ? Suicidality Father   ? Heart disease Sister   ? Anesthesia problems Neg Hx   ? Hypotension Neg Hx   ? Malignant hyperthermia Neg Hx   ? Pseudochol deficiency Neg Hx   ? Colon cancer Neg Hx   ? Esophageal cancer Neg Hx   ? Rectal cancer Neg Hx   ? Stomach cancer Neg Hx   ? ? ?Prior to Admission medications   ?Medication Sig Start Date End Date Taking? Authorizing Provider  ?aspirin EC 81 MG tablet  Take 81 mg by mouth every morning. Swallow whole.    [provider]  ?atorvastatin (LIPITOR) 10 MG tablet Take 10 mg by mouth daily.    [provider]  ?Cholecalciferol (VITAMIN D3) 125 MCG (5000 UT) CAPS Take 5,000 Units by mouth daily.    [provider]  ?cloNIDine (CATAPRES) 0.2 MG tablet Take 0.2 mg by mouth 2 (two) times daily.  06/09/16   [provider]  ?diclofenac (VOLTAREN) 75 MG EC tablet Take 75-150 mg by mouth See admin instructions. '150mg'$  oral in the morning ?'75mg'$  oral in the evening    [provider]  ?eplerenone (INSPRA) 25 MG tablet Take 25 mg by mouth daily.    [provider]  ?finasteride (PROSCAR) 5 MG tablet Take 5 mg by mouth every morning.      [provider]  ?folic acid (FOLVITE) 1 MG tablet Take 1 mg by mouth every morning.    [provider]  ?furosemide (LASIX) 40 MG tablet Take 1 tablet (40 mg total) by mouth daily. 07/16/21   Mercy Riding, MD  ?gabapentin (NEURONTIN) 100 MG capsule Take 100 mg by mouth in the morning and at bedtime.    [provider]  ?methotrexate (RHEUMATREX) 2.5 MG tablet Take 10 mg by mouth every Friday. Caution:Chemotherapy. Protect from light.  ?Friday    [provider]  ?metoprolol (TOPROL-XL) 100 MG 24 hr tablet Take 150 mg by mouth every morning.     [provider]  ?montelukast (SINGULAIR) 10 MG tablet Take 10 mg by mouth daily. 05/18/18   [provider]  ?Multiple Vitamins-Minerals (CENTRUM SILVER 50+MEN PO) Take 1 tablet by mouth every morning.    [provider]  ?omega-3 acid ethyl esters (LOVAZA) 1 g capsule Take 1 g by mouth 2 (two) times daily.    [provider]  ?polyethylene glycol (MIRALAX / GLYCOLAX) packet Take 17 g by mouth daily as needed for mild constipation.    [provider]  ?RESTASIS 0.05 % ophthalmic emulsion Place 1 drop into both eyes 2 (two) times daily as needed (dry eyes). 06/02/21   [provider]  ?senna-docusate (SENOKOT-S) 8.6-50 MG tablet Take 1 tablet by mouth 2 (two) times daily as needed for mild constipation. 07/16/21   Mercy Riding, MD  ?Skin Protectants, Misc. (EUCERIN) cream Apply to back and other affected area of skin  twice a day 07/16/21   Mercy Riding, MD  ? ? ?Physical Exam: ?Vitals:  ? 07/30/21 1500 07/30/21 1515 07/30/21 1530 07/30/21 1550  ?BP: 123/83  134/79   ?Pulse: (!) 58 (!) 58 75 66  ?Resp: (!) '21 16 18 18  '$ ?Temp:      ?TempSrc:      ?SpO2: 99% 100% 94% 92%  ? ?General: 88 y.o. male resting in bed in NAD ?Eyes: PERRL, normal sclera ?ENMT: Nares patent w/o discharge, orophaynx clear, dentition normal, ears w/o discharge/lesions/ulcers ?Neck: Supple, trachea midline ?Cardiovascular:  RRR, +S1, S2, no m/g/r, equal pulses throughout ?Respiratory: CTABL, no w/r/r, normal WOB ?GI: BS+, NDNT, no masses noted, no organomegaly noted ?MSK: No c/c; chronic BLE edema, he has some erythema of the right shin that he says is chronic ?Neuro: A&O x 3, no focal deficits ?Psyc: Appropriate interaction and affect, calm/cooperative ? ?Data Reviewed: ? ?Na+ 129 ?K+ 5.2 ?Cl-  89 ?CO2  33 ?BUN  43 ?SCr  1.58 ?BNP  185.8 ?Lactic acid 1.0 ?WBC  8.8 ?Hgb 11.1 ? ?UA leukocyte/nitrite  positive ? ?CXR: Chronic elevated right hemidiaphragm with chronic volume loss at the right lung base. No active disease otherwise. No change in appearance since the prior exam. ? ?Assessment and Plan: ?No notes have been filed under this hospital service. ?Service: Hospitalist ?AKI ?    - place in obs, tele ?    - the daily lasix may be a little much for him ?    - check renal US ?    - hold lasix for now ?    - gentle fluids, follow ? ?Hypotension ?    - fluid responsive, BP is better, monitor ? ?UTI ?    - continue rocephin, follow UCx ? ?Hyponatremia ?    - chronic; getting fluids, follow ? ?Hyperkalemia ?    - mild elevation ?    - getting fluids, follow AM labs, if still high, add lokelma ? ?BPH ?    - continue home regimen when confirmed ? ?HTN ?    - hold BP meds for today; reassess in AM ? ?Chronic diastolic HF ?    - holding lasix today; reassess for resuming in AM ?    - daily wts, I&O ? ?Bulous pemphigoid ?    - continue home regimen when confirmed ? ?HLD ?    - continue home regimen when confirmed ? ? Advance Care Planning:   Code Status: DNI ? ?Consults: None ? ?Family Communication: None at bedside ? ?Severity of Illness: ?The appropriate patient status for this patient is OBSERVATION. Observation status is judged to be reasonable and necessary in order to provide the required intensity of service to ensure the patient's safety. The patient's presenting symptoms, physical exam findings, and initial radiographic and laboratory  data in the context of their medical condition is felt to place them at decreased risk for further clinical deterioration. Furthermore, it is anticipated that the patient will be medically stable for disc

## 2021-07-30 NOTE — ED Triage Notes (Signed)
BIBA ?Per EMS: Pt coming from [facility] c/o hypotension & weakness. Episode of weakness lasting approx 30 mins around 11:45. BP upon EMS arrival was 72/45. 250 fluids given en route. CBG 160 ?All other vitals WDL.  ?18G L AC  ?A&O x 4  ?Pt wears 2L O2 baseline  ?

## 2021-07-30 NOTE — ED Provider Notes (Addendum)
[name]   CSN: [id number] Arrival date & time: 07/30/21  1234     History  Chief Complaint  Patient presents with   Weakness   Hypotension    [name] is a 88 y.o. male.  88 year old male presents with cute onset of weakness that occurred just prior to arrival.  Patient states that he has not had any recent illnesses.  States that the weakness was nonfocal and diffuse.  Denies any fever or chills.  No urinary symptoms.  No chest pain or chest pressure.  No recent medication changes.  No shortness of breath or cough.  No abdominal discomfort.  EMS was called and patient's blood sugar was above 100.  Was noted to have a systolic blood pressure of 70 and given 250 of saline with good response.  Last blood pressure was systolic 110.      Home Medications Prior to Admission medications   Medication Sig Start Date End Date Taking? Authorizing Provider  aspirin EC 81 MG tablet Take 81 mg by mouth every morning. Swallow whole.    [provider]  atorvastatin (LIPITOR) 10 MG tablet Take 10 mg by mouth daily.    [provider]  Cholecalciferol (VITAMIN D3) 125 MCG (5000 UT) CAPS Take 5,000 Units by mouth daily.    [provider]  cloNIDine (CATAPRES) 0.2 MG tablet Take 0.2 mg by mouth 2 (two) times daily.  06/09/16   [provider]  diclofenac (VOLTAREN) 75 MG EC tablet Take 75-150 mg by mouth See admin instructions. 150mg  oral in the morning 75mg  oral in the evening    [provider]  eplerenone (INSPRA) 25 MG tablet Take 25 mg by mouth daily.    [provider]  finasteride (PROSCAR) 5 MG tablet Take 5 mg by mouth every morning.     [provider]  folic acid (FOLVITE) 1 MG tablet Take 1 mg by mouth every morning.    [provider]  furosemide (LASIX) 40 MG tablet Take 1 tablet (40 mg total) by mouth daily. 07/16/21   Almon Hercules, MD  gabapentin  (NEURONTIN) 100 MG capsule Take 100 mg by mouth in the morning and at bedtime.    [provider]  methotrexate (RHEUMATREX) 2.5 MG tablet Take 10 mg by mouth every Friday. Caution:Chemotherapy. Protect from light.  Friday    [provider]  metoprolol (TOPROL-XL) 100 MG 24 hr tablet Take 150 mg by mouth every morning.     [provider]  montelukast (SINGULAIR) 10 MG tablet Take 10 mg by mouth daily. 05/18/18   [provider]  Multiple Vitamins-Minerals (CENTRUM SILVER 50+MEN PO) Take 1 tablet by mouth every morning.    [provider]  omega-3 acid ethyl esters (LOVAZA) 1 g capsule Take 1 g by mouth 2 (two) times daily.    [provider]  polyethylene glycol (MIRALAX / GLYCOLAX) packet Take 17 g by mouth daily as needed for mild constipation.    [provider]  RESTASIS 0.05 % ophthalmic emulsion Place 1 drop into both eyes 2 (two) times daily as needed (dry eyes). 06/02/21   [provider]  senna-docusate (SENOKOT-S) 8.6-50 MG tablet Take 1 tablet by mouth 2 (two) times daily as needed for mild constipation. 07/16/21   Almon Hercules, MD  Skin Protectants, Misc. (EUCERIN) cream Apply to back and other affected area of skin  twice a day 07/16/21   Alanda Slim,  Boyce Medici, MD      Allergies    Spironolactone and Hydrocodone    Review of Systems   Review of Systems  All other systems reviewed and are negative.  Physical Exam Updated Vital Signs BP 115/69   Resp 17   SpO2 96%  Physical Exam Vitals and nursing [name] reviewed.  Constitutional:      General: He is not in acute distress.    Appearance: Normal appearance. He is well-developed. He is not toxic-appearing.  HENT:     Head: Normocephalic and atraumatic.  Eyes:     General: Lids are normal.     Conjunctiva/sclera: Conjunctivae normal.     Pupils: Pupils are equal, round, and reactive to light.  Neck:     Thyroid: No thyroid mass.     Trachea: No tracheal deviation.   Cardiovascular:     Rate and Rhythm: Normal rate and regular rhythm.     Heart sounds: Normal heart sounds. No murmur heard.   No gallop.  Pulmonary:     Effort: Pulmonary effort is normal. No respiratory distress.     Breath sounds: Normal breath sounds. No stridor. No decreased breath sounds, wheezing, rhonchi or rales.  Abdominal:     General: There is no distension.     Palpations: Abdomen is soft.     Tenderness: There is no abdominal tenderness. There is no rebound.  Musculoskeletal:        General: No tenderness. Normal range of motion.     Cervical back: Normal range of motion and neck supple.  Lymphadenopathy:     Comments: 2+ bilateral lower extremity pitting edema  Skin:    General: Skin is warm and dry.     Findings: No abrasion or rash.  Neurological:     General: No focal deficit present.     Mental Status: He is alert and oriented to person, place, and time. Mental status is at baseline.     GCS: GCS eye subscore is 4. GCS verbal subscore is 5. GCS motor subscore is 6.     Cranial Nerves: No cranial nerve deficit.     Sensory: No sensory deficit.     Motor: Motor function is intact.  Psychiatric:        Attention and Perception: Attention normal.        Speech: Speech normal.        Behavior: Behavior normal.    ED Results / Procedures / Treatments   Labs (all labs ordered are listed, but only abnormal results are displayed) Labs Reviewed  RESP PANEL BY RT-PCR (FLU A&B, COVID) ARPGX2  CBC WITH DIFFERENTIAL/PLATELET  COMPREHENSIVE METABOLIC PANEL  URINALYSIS, ROUTINE W REFLEX MICROSCOPIC  BRAIN NATRIURETIC PEPTIDE  TROPONIN I (HIGH SENSITIVITY)    EKG EKG Interpretation  Date/Time:  Wednesday July 30 2021 12:47:17 EDT Ventricular Rate:  56 PR Interval:  251 QRS Duration: 155 QT Interval:  461 QTC Calculation: 445 R Axis:   52 Text Interpretation: Sinus rhythm Prolonged PR interval Right bundle branch block Confirmed by Lorre Nick ([id number]) on  07/30/2021 3:07:45 PM  Radiology No results found.  Procedures Procedures    Medications Ordered in ED Medications  lactated ringers infusion (has no administration in time range)    ED Course/ Medical Decision Making/ A&P                           Medical Decision Making Amount and/or Complexity of Data  Reviewed Labs: ordered. Radiology: ordered. ECG/medicine tests: ordered.  Risk Prescription drug management.  Patient is EKG per my interpretation shows no signs of acute coronary ischemia. Patient presented with hypotension and weakness.  Was afebrile here.  Chest x-ray reviewed and per my interpretation shows no acute findings.  Consider possible sepsis.  Blood cultures and lactate ordered.  Urinalysis does show some increased white blood cells however he does have an indwelling Foley catheter.  COVID and flu test negative.  Patient blood pressure here was stable without intervention.  Does have evidence of increased creatinine indicative of acute kidney injury.  Patient given gentle hydration here due to history of CHF.  Patient will be given IV fluids here as well as started on Rocephin for possible UTI.  Plan will be to admit to the hospitalist for observation  CRITICAL CARE Performed by: Toy Baker Total critical care time: 50 minutes Critical care time was exclusive of separately billable procedures and treating other patients. Critical care was necessary to treat or prevent imminent or life-threatening deterioration. Critical care was time spent personally by me on the following activities: development of treatment plan with patient and/or surrogate as well as nursing, discussions with consultants, evaluation of patient's response to treatment, examination of patient, obtaining history from patient or surrogate, ordering and performing treatments and interventions, ordering and review of laboratory studies, ordering and review of radiographic studies, pulse oximetry and  re-evaluation of patient's condition.         Final Clinical Impression(s) / ED Diagnoses Final diagnoses:  None    Rx / DC Orders ED Discharge Orders     None         Lorre Nick, MD 07/30/21 1508    Lorre Nick, MD 07/30/21 [phone]

## 2021-07-31 DIAGNOSIS — N179 Acute kidney failure, unspecified: Secondary | ICD-10-CM | POA: Diagnosis not present

## 2021-07-31 DIAGNOSIS — N3000 Acute cystitis without hematuria: Secondary | ICD-10-CM | POA: Diagnosis not present

## 2021-07-31 LAB — CBC
HCT: 34.3 % — ABNORMAL LOW (ref 39.0–52.0)
Hemoglobin: 11.1 g/dL — ABNORMAL LOW (ref 13.0–17.0)
MCH: 34.5 pg — ABNORMAL HIGH (ref 26.0–34.0)
MCHC: 32.4 g/dL (ref 30.0–36.0)
MCV: 106.5 fL — ABNORMAL HIGH (ref 80.0–100.0)
Platelets: 151 10*3/uL (ref 150–400)
RBC: 3.22 MIL/uL — ABNORMAL LOW (ref 4.22–5.81)
RDW: 13.1 % (ref 11.5–15.5)
WBC: 8.5 10*3/uL (ref 4.0–10.5)
nRBC: 0 % (ref 0.0–0.2)

## 2021-07-31 LAB — COMPREHENSIVE METABOLIC PANEL
ALT: 32 U/L (ref 0–44)
AST: 23 U/L (ref 15–41)
Albumin: 2.9 g/dL — ABNORMAL LOW (ref 3.5–5.0)
Alkaline Phosphatase: 66 U/L (ref 38–126)
Anion gap: 9 (ref 5–15)
BUN: 43 mg/dL — ABNORMAL HIGH (ref 8–23)
CO2: 32 mmol/L (ref 22–32)
Calcium: 9.3 mg/dL (ref 8.9–10.3)
Chloride: 91 mmol/L — ABNORMAL LOW (ref 98–111)
Creatinine, Ser: 1.46 mg/dL — ABNORMAL HIGH (ref 0.61–1.24)
GFR, Estimated: 47 mL/min — ABNORMAL LOW (ref >60)
Glucose, Bld: 99 mg/dL (ref 70–99)
Potassium: 4.5 mmol/L (ref 3.5–5.1)
Sodium: 132 mmol/L — ABNORMAL LOW (ref 135–145)
Total Bilirubin: 0.5 mg/dL (ref 0.3–1.2)
Total Protein: 6.7 g/dL (ref 6.5–8.1)

## 2021-07-31 MED ORDER — SODIUM CHLORIDE 0.9 % IV SOLN: INTRAVENOUS | Status: DC

## 2021-07-31 MED ORDER — GUAIFENESIN-DM 100-10 MG/5ML PO SYRP
5.0000 mL | ORAL_SOLUTION | ORAL | Status: DC | PRN
  Administered 2021-07-31 – 2021-08-04 (×5): 5 mL via ORAL
  Filled 2021-07-31 (×7): qty 10

## 2021-07-31 MED ORDER — METOPROLOL SUCCINATE ER 100 MG PO TB24
100.0000 mg | ORAL_TABLET | Freq: Every day | ORAL | Status: DC
Start: 2021-08-01 — End: 2021-08-05
  Administered 2021-08-01 – 2021-08-05 (×5): 100 mg via ORAL
  Filled 2021-07-31 (×5): qty 1

## 2021-07-31 MED ORDER — LACTATED RINGERS IV SOLN: INTRAVENOUS | Status: DC

## 2021-07-31 MED ORDER — SODIUM CHLORIDE 0.9 % IV SOLN: INTRAVENOUS | Status: DC | PRN

## 2021-07-31 MED ORDER — CLONIDINE HCL 0.2 MG PO TABS
0.2000 mg | ORAL_TABLET | Freq: Two times a day (BID) | ORAL | Status: DC
Start: 2021-07-31 — End: 2021-07-31

## 2021-07-31 MED ORDER — CLONIDINE HCL 0.1 MG PO TABS
0.1000 mg | ORAL_TABLET | Freq: Two times a day (BID) | ORAL | Status: DC
  Administered 2021-07-31 – 2021-08-05 (×10): 0.1 mg via ORAL
  Filled 2021-07-31 (×10): qty 1

## 2021-07-31 NOTE — Progress Notes (Signed)
[facility] ? ?PROGRESS NOTE ? ?[name] ZHG:[id number] DOB: Dec 22, 1932 DOA: 07/30/2021 ?PCP: Lajean Manes, MD ? ? ?Brief HPI:   ?88 year old male with medical history of chronic diastolic heart failure, hypertension, bullous pemphigoid, asthma, BPH, hyperlipidemia presented with hypotension.  He was recently mated for acute on chronic diastolic CHF.  Diuresed well.  He was sent to skilled nursing facility.  At discharge he was placed on Lasix 40 mg p.o. daily.  At skilled facility he has been feeling weak.  His blood pressure was found to be low at 72/45.  EMS was called and was brought to ED for further evaluation. ? ? ? ?Subjective  ? ?Patient seen and examined, denies any complaints. ? ? Assessment/Plan:  ? ? ? ?Acute kidney injury ?-Creatinine improved to 1.46 with IV fluids ?-Continue normal saline at 75 mill per hour ?-Patient baseline creatinine is around 1.1-1.2 ?-Follow BMP in am ? ?UTI ?-Patient was found to have abnormal UA on admission ?-He was empirically started on IV Rocephin ?-Urine culture was not obtained in the ED, will obtain urine culture and continue with Rocephin ? ?Hypotension ?-Resolved with fluid resuscitation ? ?Hyponatremia ?-Chronic ?-Likely from dehydration ?-Sodium improved to 132 ?-Continue normal saline ? ?Hyperkalemia ?-Resolved ? ?Hypertension ?-Blood pressure is now elevated ?-Patient was on Catapres 0.2 mg p.o. twice daily, Toprol-XL 150 mg daily ?-We will restart Catapres at lower dose of 0.1 mg p.o. twice daily , also cut down Toprol XL to 100 mm daily ?-We will monitor ? ?Chronic diastolic CHF ?-Lasix on hold ? ?Bullous pemphigoid ?-Methotrexate on hold ? ? ?Medications ? ?  ? atorvastatin  10 mg Oral Daily  ? cholecalciferol  5,000 Units Oral Daily  ? cycloSPORINE  1 drop Both Eyes BID  ? enoxaparin (LOVENOX) injection  40 mg Subcutaneous Q24H  ? finasteride  5 mg Oral Daily  ? gabapentin  100 mg Oral BID  ? montelukast  10 mg Oral Daily  ? ? ? Data Reviewed:   ? ?CBG: ? ?Recent Labs  ?Lab 07/30/21 ?1317  ?GLUCAP 127*  ? ? ?SpO2: 99 % ?O2 Flow Rate (L/min): 2 L/min  ? ? ?Vitals:  ? 07/31/21 0525 07/31/21 0527 07/31/21 0850 07/31/21 1236  ?BP: (!) 171/65 (!) 146/73 (!) 152/64 (!) 178/73  ?Pulse:   67 65  ?Resp:   14 19  ?Temp:   98.7 ?F (37.1 ?C) 98.2 ?F (36.8 ?C)  ?TempSrc:   Oral Oral  ?SpO2:   98% 99%  ?Weight:  111.8 kg    ?Height:  '5\' 11"'$  (1.803 m)    ? ? ? ? ?Data Reviewed: ? ?Basic Metabolic Panel: ?Recent Labs  ?Lab 07/30/21 ?1251 07/31/21 ?1962  ?NA 129* 132*  ?K 5.2* 4.5  ?CL 89* 91*  ?CO2 33* 32  ?GLUCOSE 113* 99  ?BUN 43* 43*  ?CREATININE 1.58* 1.46*  ?CALCIUM 9.0 9.3  ? ? ?CBC: ?Recent Labs  ?Lab 07/30/21 ?1251 07/31/21 ?2297  ?WBC 8.8 8.5  ?NEUTROABS 7.3  --   ?HGB 11.1* 11.1*  ?HCT 33.6* 34.3*  ?MCV 104.3* 106.5*  ?PLT 243 151  ? ? ?LFT ?Recent Labs  ?Lab 07/30/21 ?1251 07/31/21 ?9892  ?AST 35 23  ?ALT 26 32  ?ALKPHOS 63 66  ?BILITOT 1.8* 0.5  ?PROT 6.9 6.7  ?ALBUMIN 2.7* 2.9*  ? ?  ?Antibiotics: ?Anti-infectives (From admission, onward)  ? ? Start     Dose/Rate Route Frequency Ordered Stop  ? 07/30/21 1515  cefTRIAXone (ROCEPHIN) 2  g in sodium chloride 0.9 % 100 mL IVPB       ? 2 g ?200 mL/hr over 30 Minutes Intravenous Every 24 hours 07/30/21 1503    ? ?  ? ? ? ?DVT prophylaxis: Lovenox ? ?Code Status: Full code ? ?Family Communication: No family at bedside ? ? ?CONSULTS  ? ? ?Objective  ? ? ?Physical Examination: ? ? ?General-appears in no acute distress ?Heart-S1-S2, regular, no murmur auscultated ?Lungs-clear to auscultation bilaterally, no wheezing or crackles auscultated ?Abdomen-soft, nontender, no organomegaly ?Extremities-mild erythema in both lower extremities, right more than left, nontender to palpation ?Neuro-alert, oriented x3, no focal deficit noted ? ? ?Status is: Inpatient: Patient admitted for UTI, AKI ? ? ? ?  ? ? ? ?[name] ?  ?[facility] ?If 7PM-7AM, please contact night-coverage at [email or web address], ?Office   [phone] ? ? ?07/31/2021, 3:24 PM  LOS: 0 days  ? ? ? ? ? ? ? ? ? ? ?  ?

## 2021-07-31 NOTE — Consult Note (Signed)
WOC Nurse Consult Note: ?Patient receiving care in [place]. ?Reason for Consult: multiple areas ?Wound type: ?Scabbed over areas x 2 on dorsum of right hand. Nothing needed here except routine hygiene measures with soap and water. ?Healing abrasion to RLE. ?Erythema and edema to BLE ?Pressure Injury POA: Yes/No/NA ?Measurement: na ?Wound bed: abrasion to RLE is pink, clean, no s/s of infection ?Drainage (amount, consistency, odor) none ?Periwound: erythematous to RLE in general and edema. ?Dressing procedure/placement/frequency: ?Gently cleanse the abrasion area on the RLE with saline, pat dry. Place a small piece of Xeroform gauze Kellie Simmering [id number]) over the open wound area, then a small foam dressing. Change both every 3 days. ? ?I have also added the use of bilateral Prevalon Boots. ? ?Monitor the wound area(s) for worsening of condition such as: ?Signs/symptoms of infection,  ?Increase in size,  ?Development of or worsening of odor, ?Development of pain, or increased pain at the affected locations.  Notify the medical team if any of these develop. ? ?Thank you for the consult.  Discussed plan of care with the patient.  [facility] nurse will not follow at this time.  Please re-consult the [facility] team if needed. ? ?Val Riles, RN, MSN, CWOCN, CNS-BC, pager [phone]  ?  ?

## 2021-07-31 NOTE — Progress Notes (Signed)
Received report from off going nurse. No change in previous nurse's assessment. Call light and belongings within reach.  ?

## 2021-07-31 NOTE — Care Management Obs Status (Signed)
MEDICARE OBSERVATION STATUS NOTIFICATION ? ? ?Patient Details  ?Name: [name] ?MRN: [id number] ?Date of Birth: 1933-03-12 ? ? ?Medicare Observation Status Notification Given:  Yes ? ? ? ?Dessa Phi, RN ?07/31/2021, 12:17 PM ?

## 2021-07-31 NOTE — TOC Initial Note (Signed)
Transition of Care (TOC) - Initial/Assessment Note  ? ? ?Patient Details  ?Name: [name] ?MRN: [id number] ?Date of Birth: 1933/04/12 ? ?Transition of Care [facility]) CM/SW Contact:    ?Dessa Phi, RN ?Phone Number: ?07/31/2021, 12:14 PM ? ?Clinical Narrative: From [facility] patient plans to return if possible or go to [place] if [facility] not available. PT cons-await recc.               ? ? ?Expected Discharge Plan: [place] ?Barriers to Discharge: Continued Medical Work up ? ? ?Patient Goals and CMS Choice ?Patient states their goals for this hospitalization and ongoing recovery are:: Return to [facility] ?CMS Medicare.gov Compare Post Acute Care list provided to:: Patient ?Choice offered to / list presented to : Patient ? ?Expected Discharge Plan and Services ?Expected Discharge Plan: [place] ?  ?Discharge Planning Services: CM Consult ?  ?Living arrangements for the past 2 months: [place] ?                ?  ?  ?  ?  ?  ?  ?  ?  ?  ?  ? ?Prior Living Arrangements/Services ?Living arrangements for the past 2 months: [place] ?Lives with:: Facility Resident ?Patient language and need for interpreter reviewed:: Yes ?Do you feel safe going back to the place where you live?: Yes      ?Need for Family Participation in Patient Care: Yes (Comment) ?Care giver support system in place?: Yes (comment) ?  ?Criminal Activity/Legal Involvement Pertinent to Current Situation/Hospitalization: No - Comment as needed ? ?Activities of Daily Living ?Home Assistive Devices/Equipment: Wheelchair ?ADL Screening (condition at time of admission) ?Patient's cognitive ability adequate to safely complete daily activities?: Yes ?Is the patient deaf or have difficulty hearing?: No ?Does the patient have difficulty seeing, even when wearing glasses/contacts?: No ?Does the patient have difficulty concentrating, remembering, or making decisions?: No ?Patient able to  express need for assistance with ADLs?: Yes ?Does the patient have difficulty dressing or bathing?: Yes ?Independently performs ADLs?: Yes (appropriate for developmental age) ?Communication: Independent ?Dressing (OT): Needs assistance ?Does the patient have difficulty walking or climbing stairs?: Yes ?Weakness of Legs: Both ?Weakness of Arms/Hands: None ? ?Permission Sought/Granted ?Permission sought to share information with : Case Manager ?Permission granted to share information with : Yes, Verbal Permission Granted ? Share Information with NAME: Case Manager ?   ?   ?   ? ?Emotional Assessment ?Appearance:: Appears stated age ?Attitude/Demeanor/Rapport: Gracious ?Affect (typically observed): Accepting ?Orientation: : Oriented to Self, Oriented to Place, Oriented to  Time, Oriented to Situation ?Alcohol / Substance Use: Not Applicable ?Psych Involvement: No (comment) ? ?Admission diagnosis:  UTI (urinary tract infection) [N39.0] ?Acute cystitis without hematuria [N30.00] ?AKI (acute kidney injury) ([place]) [N17.9] ?Patient Active Problem List  ? Diagnosis Date Noted  ? UTI (urinary tract infection) 07/30/2021  ? Hypotension 07/30/2021  ? Hyperkalemia 07/30/2021  ? Renal insufficiency 07/11/2021  ? Acute on chronic diastolic CHF (congestive heart failure) ([place]) 07/11/2021  ? CHF exacerbation ([place]) 07/11/2021  ? Acute respiratory failure with hypoxia ([place]) 07/10/2021  ? Cellulitis of right leg 10/15/2019  ? Hyperlipidemia 10/15/2019  ? AKI (acute kidney injury) ([place]) 10/15/2019  ? Bullous pemphigoid 10/15/2019  ? Cellulitis and abscess of toe of right foot 07/25/2019  ? Foot osteomyelitis, right ([place]) 06/22/2019  ? Left leg cellulitis 12/31/2015  ? Edema of extremities 07/06/2013  ? PVC's (premature ventricular contractions)   ?  Degenerative arthritis of hip 10/14/2012  ? Postoperative anemia due to acute blood loss 06/04/2011  ? Asthma, chronic 06/04/2011  ? Hyponatremia 06/04/2011  ? Tachycardia 04/07/2011  ?  Hypertension 04/07/2011  ? BPH (benign prostatic hyperplasia) 04/07/2011  ? DJD (degenerative joint disease) 04/07/2011  ? ?PCP:  Lajean Manes, MD ?Pharmacy:   ?[id number] [address]?Phone: [phone]: [phone]? ? ? ? ?Social Determinants of Health (SDOH) Interventions ?  ? ?Readmission Risk Interventions ?No flowsheet data found. ? ? ?

## 2021-08-01 DIAGNOSIS — L0390 Cellulitis, unspecified: Secondary | ICD-10-CM | POA: Diagnosis present

## 2021-08-01 DIAGNOSIS — E871 Hypo-osmolality and hyponatremia: Secondary | ICD-10-CM

## 2021-08-01 DIAGNOSIS — N390 Urinary tract infection, site not specified: Secondary | ICD-10-CM | POA: Diagnosis present

## 2021-08-01 DIAGNOSIS — Z96642 Presence of left artificial hip joint: Secondary | ICD-10-CM | POA: Diagnosis present

## 2021-08-01 DIAGNOSIS — I110 Hypertensive heart disease with heart failure: Secondary | ICD-10-CM | POA: Diagnosis present

## 2021-08-01 DIAGNOSIS — Z79899 Other long term (current) drug therapy: Secondary | ICD-10-CM | POA: Diagnosis not present

## 2021-08-01 DIAGNOSIS — Z96653 Presence of artificial knee joint, bilateral: Secondary | ICD-10-CM | POA: Diagnosis present

## 2021-08-01 DIAGNOSIS — N3000 Acute cystitis without hematuria: Secondary | ICD-10-CM | POA: Diagnosis not present

## 2021-08-01 DIAGNOSIS — E875 Hyperkalemia: Secondary | ICD-10-CM | POA: Diagnosis present

## 2021-08-01 DIAGNOSIS — Z8249 Family history of ischemic heart disease and other diseases of the circulatory system: Secondary | ICD-10-CM | POA: Diagnosis not present

## 2021-08-01 DIAGNOSIS — K5900 Constipation, unspecified: Secondary | ICD-10-CM | POA: Diagnosis present

## 2021-08-01 DIAGNOSIS — Z20822 Contact with and (suspected) exposure to covid-19: Secondary | ICD-10-CM | POA: Diagnosis present

## 2021-08-01 DIAGNOSIS — Z7982 Long term (current) use of aspirin: Secondary | ICD-10-CM | POA: Diagnosis not present

## 2021-08-01 DIAGNOSIS — I952 Hypotension due to drugs: Secondary | ICD-10-CM | POA: Diagnosis not present

## 2021-08-01 DIAGNOSIS — L120 Bullous pemphigoid: Secondary | ICD-10-CM | POA: Diagnosis present

## 2021-08-01 DIAGNOSIS — Z888 Allergy status to other drugs, medicaments and biological substances status: Secondary | ICD-10-CM | POA: Diagnosis not present

## 2021-08-01 DIAGNOSIS — E860 Dehydration: Secondary | ICD-10-CM | POA: Diagnosis present

## 2021-08-01 DIAGNOSIS — Z885 Allergy status to narcotic agent status: Secondary | ICD-10-CM | POA: Diagnosis not present

## 2021-08-01 DIAGNOSIS — E669 Obesity, unspecified: Secondary | ICD-10-CM | POA: Diagnosis present

## 2021-08-01 DIAGNOSIS — N400 Enlarged prostate without lower urinary tract symptoms: Secondary | ICD-10-CM | POA: Diagnosis present

## 2021-08-01 DIAGNOSIS — E785 Hyperlipidemia, unspecified: Secondary | ICD-10-CM | POA: Diagnosis present

## 2021-08-01 DIAGNOSIS — I5032 Chronic diastolic (congestive) heart failure: Secondary | ICD-10-CM | POA: Diagnosis present

## 2021-08-01 DIAGNOSIS — I959 Hypotension, unspecified: Secondary | ICD-10-CM | POA: Diagnosis present

## 2021-08-01 DIAGNOSIS — N179 Acute kidney failure, unspecified: Secondary | ICD-10-CM | POA: Diagnosis present

## 2021-08-01 DIAGNOSIS — L03115 Cellulitis of right lower limb: Secondary | ICD-10-CM | POA: Diagnosis present

## 2021-08-01 DIAGNOSIS — R0902 Hypoxemia: Secondary | ICD-10-CM | POA: Diagnosis not present

## 2021-08-01 DIAGNOSIS — M1990 Unspecified osteoarthritis, unspecified site: Secondary | ICD-10-CM | POA: Diagnosis present

## 2021-08-01 LAB — BASIC METABOLIC PANEL
Anion gap: 10 (ref 5–15)
BUN: 29 mg/dL — ABNORMAL HIGH (ref 8–23)
CO2: 26 mmol/L (ref 22–32)
Calcium: 8.7 mg/dL — ABNORMAL LOW (ref 8.9–10.3)
Chloride: 96 mmol/L — ABNORMAL LOW (ref 98–111)
Creatinine, Ser: 1.02 mg/dL (ref 0.61–1.24)
GFR, Estimated: >60 mL/min (ref >60)
Glucose, Bld: 101 mg/dL — ABNORMAL HIGH (ref 70–99)
Potassium: 4.3 mmol/L (ref 3.5–5.1)
Sodium: 132 mmol/L — ABNORMAL LOW (ref 135–145)

## 2021-08-01 MED ORDER — BISACODYL 10 MG RE SUPP
10.0000 mg | Freq: Once | RECTAL | Status: AC
Start: 2021-08-01 — End: 2021-08-01
  Administered 2021-08-01: 10 mg via RECTAL
  Filled 2021-08-01: qty 1

## 2021-08-01 MED ORDER — HYDROXYZINE HCL 25 MG PO TABS
25.0000 mg | ORAL_TABLET | Freq: Once | ORAL | Status: AC
  Administered 2021-08-02: 25 mg via ORAL
  Filled 2021-08-01: qty 1

## 2021-08-01 MED ORDER — FUROSEMIDE 20 MG PO TABS
20.0000 mg | ORAL_TABLET | Freq: Every day | ORAL | Status: DC
  Administered 2021-08-01 – 2021-08-05 (×5): 20 mg via ORAL
  Filled 2021-08-01 (×5): qty 1

## 2021-08-01 MED ORDER — LIP MEDEX EX OINT
TOPICAL_OINTMENT | CUTANEOUS | Status: DC | PRN
  Administered 2021-08-01: 75 via TOPICAL
  Filled 2021-08-01: qty 7

## 2021-08-01 NOTE — Progress Notes (Addendum)
[facility] ? ?PROGRESS NOTE ? ?[name] PYK:[id number] DOB: February 11, 1933 DOA: 07/30/2021 ?PCP: Roy Manes, MD ? ? ?Brief HPI:   ?88 year old male with medical history of chronic diastolic heart failure, hypertension, bullous pemphigoid, asthma, BPH, hyperlipidemia presented with hypotension.  He was recently mated for acute on chronic diastolic CHF.  Diuresed well.  He was sent to skilled nursing facility.  At discharge he was placed on Lasix 40 mg p.o. daily.  At skilled facility he has been feeling weak.  His blood pressure was found to be low at 72/45.  EMS was called and was brought to ED for further evaluation. ? ? ? ?Subjective  ? ?Patient seen and examined, denies any complaints. ? ? Assessment/Plan:  ? ? ? ?Acute kidney injury ?-Creatinine improved to 1.46 with IV fluids ?-Continue normal saline at 75 mill per hour ?-Patient baseline creatinine is around 1.1-1.2 ?-Follow BMP in am ? ?Right lower extremity cellulitis ?-Patient noted to have significant erythema of right lower extremity ?-Started on IV Rocephin ? ?UTI ?-Patient was found to have abnormal UA on admission ?-He was empirically started on IV Rocephin ?-Urine culture was not obtained in the ED ?-Urine culture obtained yesterday shows 20,000 colonies per mL gram-negative rods ?-Continue IV Rocephin ? ?Hypotension ?-Resolved with fluid resuscitation ? ?Hyponatremia ?-Chronic ?-Likely from dehydration ?-Sodium improved to 132 ?-Continue normal saline ? ?Hyperkalemia ?-Resolved ? ?Hypertension ?-Blood pressure is now elevated ?-Patient was on Catapres 0.2 mg p.o. twice daily, Toprol-XL 150 mg daily ?-Restarted at low-dose of Catapres 0.1 mg p.o. twice daily and Toprol XL 100 mg daily ?-Blood pressure is stable ? ? ?Chronic diastolic CHF ?-We will restart Lasix 20 mg daily as patient had brief hypoxemia last night requiring 2 L pulmonary off oxygen ? ?Bullous pemphigoid ?-Methotrexate on hold ? ? ?Medications ? ?  ? atorvastatin  10 mg  Oral Daily  ? bisacodyl  10 mg Rectal Once  ? cholecalciferol  5,000 Units Oral Daily  ? cloNIDine  0.1 mg Oral BID  ? cycloSPORINE  1 drop Both Eyes BID  ? enoxaparin (LOVENOX) injection  40 mg Subcutaneous Q24H  ? finasteride  5 mg Oral Daily  ? gabapentin  100 mg Oral BID  ? metoprolol succinate  100 mg Oral Daily  ? montelukast  10 mg Oral Daily  ? ? ? Data Reviewed:  ? ?CBG: ? ?Recent Labs  ?Lab 07/30/21 ?1317  ?GLUCAP 127*  ? ? ?SpO2: 93 % ?O2 Flow Rate (L/min): 2 L/min  ? ? ?Vitals:  ? 07/31/21 2055 08/01/21 0545 08/01/21 0936 08/01/21 1020  ?BP: (!) 155/79 137/72 135/68 135/68  ?Pulse: 70 99 89 92  ?Resp: 19 19    ?Temp: 98.3 ?F (36.8 ?C) 97.7 ?F (36.5 ?C)    ?TempSrc: Oral Oral    ?SpO2: 100% (!) 89%  93%  ?Weight:  108 kg    ?Height:      ? ? ? ? ?Data Reviewed: ? ?Basic Metabolic Panel: ?Recent Labs  ?Lab 07/30/21 ?1251 07/31/21 ?5397 08/01/21 ?0440  ?NA 129* 132* 132*  ?K 5.2* 4.5 4.3  ?CL 89* 91* 96*  ?CO2 33* 32 26  ?GLUCOSE 113* 99 101*  ?BUN 43* 43* 29*  ?CREATININE 1.58* 1.46* 1.02  ?CALCIUM 9.0 9.3 8.7*  ? ? ?CBC: ?Recent Labs  ?Lab 07/30/21 ?1251 07/31/21 ?6734  ?WBC 8.8 8.5  ?NEUTROABS 7.3  --   ?HGB 11.1* 11.1*  ?HCT 33.6* 34.3*  ?MCV 104.3* 106.5*  ?PLT 243  151  ? ? ?LFT ?Recent Labs  ?Lab 07/30/21 ?1251 07/31/21 ?3664  ?AST 35 23  ?ALT 26 32  ?ALKPHOS 63 66  ?BILITOT 1.8* 0.5  ?PROT 6.9 6.7  ?ALBUMIN 2.7* 2.9*  ? ?  ?Antibiotics: ?Anti-infectives (From admission, onward)  ? ? Start     Dose/Rate Route Frequency Ordered Stop  ? 07/30/21 1515  cefTRIAXone (ROCEPHIN) 2 g in sodium chloride 0.9 % 100 mL IVPB       ? 2 g ?200 mL/hr over 30 Minutes Intravenous Every 24 hours 07/30/21 1503    ? ?  ? ? ? ?DVT prophylaxis: Lovenox ? ?Code Status: Full code ? ?Family Communication: No family at bedside ? ? ?CONSULTS  ? ? ?Objective  ? ? ?Physical Examination: ? ? ?General-appears in no acute distress ?Heart-S1-S2, regular, no murmur auscultated ?Lungs-clear to auscultation bilaterally, no wheezing or  crackles auscultated ?Abdomen-soft, nontender, no organomegaly ?Extremities-marked erythema noted in right lower extremity ?Neuro-alert, oriented x3, no focal deficit noted ? ? ?Status is: Inpatient: Patient admitted for UTI, AKI ? ? ? ?  ? ? ? ?[name] ?  ?[facility] ?If 7PM-7AM, please contact night-coverage at [email or web address], ?Office  [phone] ? ? ?08/01/2021, 11:15 AM  LOS: 0 days  ? ? ? ? ? ? ? ? ? ? ?  ?

## 2021-08-01 NOTE — [place] FL2 (Signed)
?[place] MEDICAID FL2 LEVEL OF CARE SCREENING TOOL  ?  ? ?IDENTIFICATION  ?Patient Name: ?[name] Birthdate: 19-Apr-1933 Sex: male Admission Date (Current Location): ?07/30/2021  ?[place] and [place] Number: ? [place] ?  Facility and Address:  ?[facility],  [place], [place] ?     Provider Number: ?[id number]  ?Attending Physician Name and Address:  ?[name], MD ? Relative Name and Phone Number:  ?Dorena Dew [phone] ?   ?Current Level of Care: ?Hospital Recommended Level of Care: ?[place] Prior Approval Number: ?  ? ?Date Approved/Denied: ?  PASRR Number: ?[id number] A ? ?Discharge Plan: ?SNF ?  ? ?Current Diagnoses: ?Patient Active Problem List  ? Diagnosis Date Noted  ? Cellulitis 08/01/2021  ? UTI (urinary tract infection) 07/30/2021  ? Hypotension 07/30/2021  ? Hyperkalemia 07/30/2021  ? Renal insufficiency 07/11/2021  ? Acute on chronic diastolic CHF (congestive heart failure) ([place]) 07/11/2021  ? CHF exacerbation ([place]) 07/11/2021  ? Acute respiratory failure with hypoxia ([place]) 07/10/2021  ? Cellulitis of right leg 10/15/2019  ? Hyperlipidemia 10/15/2019  ? AKI (acute kidney injury) ([place]) 10/15/2019  ? Bullous pemphigoid 10/15/2019  ? Cellulitis and abscess of toe of right foot 07/25/2019  ? Foot osteomyelitis, right ([place]) 06/22/2019  ? Left leg cellulitis 12/31/2015  ? Edema of extremities 07/06/2013  ? PVC's (premature ventricular contractions)   ? Degenerative arthritis of hip 10/14/2012  ? Postoperative anemia due to acute blood loss 06/04/2011  ? Asthma, chronic 06/04/2011  ? Hyponatremia 06/04/2011  ? Tachycardia 04/07/2011  ? Hypertension 04/07/2011  ? BPH (benign prostatic hyperplasia) 04/07/2011  ? DJD (degenerative joint disease) 04/07/2011  ? ? ?Orientation RESPIRATION BLADDER Height & Weight   ?  ?Self, Time, Situation, Place ? O2 Continent Weight: 108 kg ?Height:  '5\' 11"'$  (180.3 cm)  ?BEHAVIORAL SYMPTOMS/MOOD  NEUROLOGICAL BOWEL NUTRITION STATUS  ?    Continent Diet (Heart Healthy;1500 fluid restriction)  ?AMBULATORY STATUS COMMUNICATION OF NEEDS Skin   ?Limited Assist Verbally Other (Comment) (BLE wounds-soap,h20,xerform gauze,foam dsg change Q 3 dys.) ?  ?  ?  ?    ?     ?     ? ? ?Personal Care Assistance Level of Assistance  ?Bathing, Feeding, Dressing Bathing Assistance: Limited assistance ?Feeding assistance: Limited assistance ?Dressing Assistance: Limited assistance ?   ? ?Functional Limitations Info  ?Sight, Hearing, Speech Sight Info: Impaired (eyeglasses) ?Hearing Info: Adequate ?Speech Info: Impaired (Partial bottom dentures;Full dentures top.)  ? ? ?SPECIAL CARE FACTORS FREQUENCY  ?PT (By licensed PT), OT (By licensed OT)   ?  ?PT Frequency:  (5x week) ?OT Frequency:  (5x week) ?  ?  ?  ?   ? ? ?Contractures Contractures Info: Not present  ? ? ?Additional Factors Info  ?Code Status, Allergies Code Status Info:  (DNR) ?Allergies Info:  (Spironolactone, Hydrocodone) ?  ?  ?  ?   ? ?Current Medications (08/01/2021):  This is the current hospital active medication list ?Current Facility-Administered Medications  ?Medication Dose Route Frequency Provider Last Rate Last Admin  ? 0.9 %  sodium chloride infusion   Intravenous PRN [name], MD 10 mL/hr at 07/31/21 1527 New Bag at 07/31/21 1527  ? acetaminophen (TYLENOL) tablet 650 mg  650 mg Oral Q6H PRN Cherylann Ratel A, DO   650 mg at 07/31/21 2120  ? Or  ? acetaminophen (TYLENOL) suppository 650 mg  650 mg Rectal Q6H PRN Cherylann Ratel A, DO      ?  atorvastatin (LIPITOR) tablet 10 mg  10 mg Oral Daily Kyle, Tyrone A, DO   10 mg at 08/01/21 0936  ? bisacodyl (DULCOLAX) suppository 10 mg  10 mg Rectal Once [name], MD      ? cefTRIAXone (ROCEPHIN) 2 g in sodium chloride 0.9 % 100 mL IVPB  2 g Intravenous Q24H Kyle, Tyrone A, DO 200 mL/hr at 07/31/21 1532 2 g at 07/31/21 1532  ? cholecalciferol (VITAMIN D) tablet 5,000 Units  5,000 Units Oral Daily Cherylann Ratel  A, DO   5,000 Units at 08/01/21 0936  ? cloNIDine (CATAPRES) tablet 0.1 mg  0.1 mg Oral BID [name], MD   0.1 mg at 08/01/21 0936  ? cycloSPORINE (RESTASIS) 0.05 % ophthalmic emulsion 1 drop  1 drop Both Eyes BID Kathryne Eriksson, NP      ? enoxaparin (LOVENOX) injection 40 mg  40 mg Subcutaneous Q24H Kyle, Tyrone A, DO   40 mg at 07/31/21 2106  ? finasteride (PROSCAR) tablet 5 mg  5 mg Oral Daily Kyle, Tyrone A, DO   5 mg at 08/01/21 7096  ? furosemide (LASIX) tablet 20 mg  20 mg Oral Daily [name], MD      ? gabapentin (NEURONTIN) capsule 100 mg  100 mg Oral BID Marylyn Ishihara, Tyrone A, DO   100 mg at 08/01/21 2836  ? guaiFENesin-dextromethorphan (ROBITUSSIN DM) 100-10 MG/5ML syrup 5 mL  5 mL Oral Q4H PRN [name], MD   5 mL at 07/31/21 2120  ? lip balm (CARMEX) ointment   Topical PRN [name], MD      ? metoprolol succinate (TOPROL-XL) 24 hr tablet 100 mg  100 mg Oral Daily [name], MD   100 mg at 08/01/21 0936  ? montelukast (SINGULAIR) tablet 10 mg  10 mg Oral Daily Kyle, Tyrone A, DO   10 mg at 08/01/21 0936  ? ondansetron (ZOFRAN) tablet 4 mg  4 mg Oral Q6H PRN Cherylann Ratel A, DO      ? Or  ? ondansetron (ZOFRAN) injection 4 mg  4 mg Intravenous Q6H PRN Marylyn Ishihara, Tyrone A, DO   4 mg at 08/01/21 0021  ? ? ? ?Discharge Medications: ?Please see discharge summary for a list of discharge medications. ? ?Relevant Imaging Results: ? ?Relevant Lab Results: ? ? ?Additional Information ?SSN [id number];TKPTW vaccine [employer] x2 ? ?Dessa Phi, RN ? ? ? ? ?

## 2021-08-01 NOTE — TOC Progression Note (Signed)
Transition of Care (TOC) - Progression Note  ? ? ?Patient Details  ?Name: [name] ?MRN: [id number] ?Date of Birth: 1932/11/22 ? ?Transition of Care (TOC) CM/SW Contact  ?Dessa Phi, RN ?Phone Number: ?08/01/2021, 1:04 PM ? ?Clinical Narrative: PT recc SNF-faxed out per patient permission-prefers [facility],declines returning back to [facility].Await bed offers prior auth.   ? ? ? ?Expected Discharge Plan: [place] ?Barriers to Discharge: Continued Medical Work up ? ?Expected Discharge Plan and Services ?Expected Discharge Plan: [place] ?  ?Discharge Planning Services: CM Consult ?  ?Living arrangements for the past 2 months: [place] ?                ?  ?  ?  ?  ?  ?  ?  ?  ?  ?  ? ? ?Social Determinants of Health (SDOH) Interventions ?  ? ?Readmission Risk Interventions ?No flowsheet data found. ? ?

## 2021-08-01 NOTE — Plan of Care (Signed)
  Problem: Education: Goal: Knowledge of General Education information will improve Description: Including pain rating scale, medication(s)/side effects and non-pharmacologic comfort measures Outcome: Progressing   Problem: Clinical Measurements: Goal: Will remain free from infection Outcome: Progressing Goal: Diagnostic test results will improve Outcome: Progressing Goal: Respiratory complications will improve Outcome: Progressing   

## 2021-08-01 NOTE — Evaluation (Signed)
Physical Therapy Evaluation ?Patient Details ?Name: [name] ?MRN: [id number] ?DOB: September 15, 1932 ?Today's Date: 08/01/2021 ? ?History of Present Illness ? 88 year old male with medical history of chronic diastolic heart failure, hypertension, bullous pemphigoid, asthma, BPH, hyperlipidemia presented with hypotension.  He was recently mated for acute on chronic diastolic CHF.  Diuresed well.  He was sent to skilled nursing facility.  At discharge he was placed on Lasix 40 mg p.o. daily.  At skilled facility he has been feeling weak.  His blood pressure was found to be low at 72/45.  EMS was called and was brought to ED for further evaluation.  ?Clinical Impression ? Pt presents with dependencies in mobility secondary to the above diagnosis. Pt reports he did live alone and was Independent with RW. Pt had a recent fall and hospitalization and was d/c to [facility]. Pt reported he did not receive therapy at the SNF. Pt is currently being treated for a UTI. Pt demonstrates significant general weakness and poor mobility. Pt sat EOB, but unable to stand or transfer to the chair today. Pt is fearful and reluctant to stand stand. Pt will continue to benefit from continued acute skilled PT to maximize mobility and independence for the next venue of care.  ?   ? ?Recommendations for follow up therapy are one component of a multi-disciplinary discharge planning process, led by the attending physician.  Recommendations may be updated based on patient status, additional functional criteria and insurance authorization. ? ?Follow Up Recommendations Skilled nursing-short term rehab (<3 hours/day) ? ?  ?Assistance Recommended at Discharge Frequent or constant Supervision/Assistance  ?Patient can return home with the following ? A lot of help with walking and/or transfers;Assistance with cooking/housework;A lot of help with bathing/dressing/bathroom;Assist for transportation ? ?  ?Equipment Recommendations None recommended by PT   ?Recommendations for Other Services ?    ?  ?Functional Status Assessment Patient has had a recent decline in their functional status and demonstrates the ability to make significant improvements in function in a reasonable and predictable amount of time.  ? ?  ?Precautions / Restrictions Precautions ?Precautions: Fall ?Precaution Comments: BLE wounds ?Restrictions ?Weight Bearing Restrictions: No  ? ?  ? ?Mobility ? Bed Mobility ?Overal bed mobility: Needs Assistance ?Bed Mobility: Supine to Sit ?  ?  ?Supine to sit: Mod assist, HOB elevated ?Sit to supine: Mod assist ?  ?  ?Patient Response: Flat affect ? ?Transfers ?  ?  ?  ?  ?  ?  ?  ?  ?  ?General transfer comment: Pt sat EOB, pt was reluctant to attempt standing. Elevated bed to assist with stand attempt. Pt's feet kept sliding out from under him and he reported he did not want to try and stand. We decided to try lateral scoots over to [facility] and pt was unable. Pt layed back to sidelying and therapist assisted him back to [facility] with max verbal cues, use of bed rails and mod assist. ?  ? ?Ambulation/Gait ?  ?  ?  ?  ?  ?  ?Pre-gait activities: pt unable to do lateral scoot transfer on EOB with therapist assist. Pt fearful and declined trying further. ?General Gait Details: unable ? ?Stairs ?  ?  ?  ?  ?  ? ?Wheelchair Mobility ?  ? ?Modified Rankin (Stroke Patients Only) ?  ? ?  ? ?Balance Overall balance assessment: Needs assistance ?Sitting-balance support: Feet supported, Bilateral upper extremity supported ?Sitting balance-Leahy Scale: Fair ?  ?  ?  ?  ?  ?  ?  ?  ?  ?  ?  ?  ?  ?  ?  ?  ?   ? ? ? ?  Pertinent Vitals/Pain Pain Assessment ?Pain Assessment: No/denies pain  ? ? ?Home Living Family/patient expects to be discharged to:: Private residence ?Living Arrangements: Alone ?Available Help at Discharge: Friend(s);Available PRN/intermittently ?Type of Home: House ?Home Access: Stairs to enter ?Entrance Stairs-Rails: None ?Entrance Stairs-Number of Steps: 1 ?   ?Home Layout: One level ?Home Equipment: Air cabin crew (4 wheels);Cane - single point ?   ?  ?Prior Function Prior Level of Function : Independent/Modified Independent;Driving ?  ?  ?  ?  ?  ?  ?Mobility Comments: ambulates with cane or RW for ambulation ; pt reports moving around well and fall was accident due to rain - friends arrived and report pt has been getting weak and having difficulty moving past 2 weeks. Went to SNF and did not have therapy per pt and felt weak and transfered to [facility] ?  ?  ? ? ?Hand Dominance  ?   ? ?  ?Extremity/Trunk Assessment  ? Upper Extremity Assessment ?Upper Extremity Assessment: Defer to OT evaluation ?  ? ?Lower Extremity Assessment ?Lower Extremity Assessment: Generalized weakness ?  ? ?Cervical / Trunk Assessment ?Cervical / Trunk Assessment: Kyphotic  ?Communication  ? Communication: No difficulties  ?Cognition Arousal/Alertness: Awake/alert ?Behavior During Therapy: Flat affect ?Overall Cognitive Status: Within Functional Limits for tasks assessed ?  ?  ?  ?  ?  ?  ?  ?  ?  ?  ?  ?  ?  ?  ?  ?  ?  ?  ?  ? ?  ?General Comments General comments (skin integrity, edema, etc.): sores on LE, edema noted and hammer toes ? ?  ?Exercises General Exercises - Lower Extremity ?Ankle Circles/Pumps: AROM, 10 reps, Supine ?Heel Slides: AROM, Strengthening, Both, 5 reps, Supine ?Straight Leg Raises: AROM, Strengthening, Both, 5 reps, Supine  ? ?Assessment/Plan  ?  ?PT Assessment Patient needs continued PT services  ?PT Problem List Decreased strength;Decreased mobility;Decreased safety awareness;Decreased range of motion;Decreased coordination;Decreased activity tolerance;Cardiopulmonary status limiting activity;Decreased balance;Decreased knowledge of use of DME ? ?   ?  ?PT Treatment Interventions DME instruction;Therapeutic activities;Gait training;Therapeutic exercise;Patient/family education;Balance training;Functional mobility training   ? ?PT Goals (Current goals can be found in  the Care Plan section)  ?Acute Rehab PT Goals ?Patient Stated Goal: return home ?PT Goal Formulation: With patient/family ?Time For Goal Achievement: 08/15/21 ?Potential to Achieve Goals: Good ? ?  ?Frequency Min 2X/week ?  ? ? ?Co-evaluation   ?  ?  ?  ?  ? ? ?  ?AM-PAC PT "6 Clicks" Mobility  ?Outcome Measure Help needed turning from your back to your side while in a flat bed without using bedrails?: A Lot ?Help needed moving from lying on your back to sitting on the side of a flat bed without using bedrails?: A Lot ?Help needed moving to and from a bed to a chair (including a wheelchair)?: Total ?Help needed standing up from a chair using your arms (e.g., wheelchair or bedside chair)?: Total ?Help needed to walk in hospital room?: Total ?Help needed climbing 3-5 steps with a railing? : Total ?6 Click Score: 8 ? ?  ?End of Session   ?Activity Tolerance: Patient limited by fatigue ?Patient left: in bed;with bed alarm set;with call bell/phone within reach;with family/visitor present ?Nurse Communication: Mobility status ?PT Visit Diagnosis: Other abnormalities of gait and mobility (R26.89);Muscle weakness (generalized) (M62.81) ?  ? ?Time: 1001-1033 ?PT Time Calculation (min) (ACUTE ONLY): 32 min ? ? ?Charges:   PT Evaluation ?$PT Eval  Moderate Complexity: 1 Mod ?PT Treatments ?$Therapeutic Activity: 8-22 mins ?  ?   ? ? ? ?Lelon Mast ?08/01/2021, 10:34 AM ? ?

## 2021-08-02 ENCOUNTER — Inpatient Hospital Stay (HOSPITAL_COMMUNITY): Payer: Medicare PPO

## 2021-08-02 DIAGNOSIS — N3000 Acute cystitis without hematuria: Secondary | ICD-10-CM | POA: Diagnosis not present

## 2021-08-02 DIAGNOSIS — N179 Acute kidney failure, unspecified: Secondary | ICD-10-CM | POA: Diagnosis not present

## 2021-08-02 DIAGNOSIS — E871 Hypo-osmolality and hyponatremia: Secondary | ICD-10-CM | POA: Diagnosis not present

## 2021-08-02 LAB — CBC
HCT: 31.5 % — ABNORMAL LOW (ref 39.0–52.0)
Hemoglobin: 10.4 g/dL — ABNORMAL LOW (ref 13.0–17.0)
MCH: 34.3 pg — ABNORMAL HIGH (ref 26.0–34.0)
MCHC: 33 g/dL (ref 30.0–36.0)
MCV: 104 fL — ABNORMAL HIGH (ref 80.0–100.0)
Platelets: 124 10*3/uL — ABNORMAL LOW (ref 150–400)
RBC: 3.03 MIL/uL — ABNORMAL LOW (ref 4.22–5.81)
RDW: 13.2 % (ref 11.5–15.5)
WBC: 5.5 10*3/uL (ref 4.0–10.5)
nRBC: 0 % (ref 0.0–0.2)

## 2021-08-02 LAB — COMPREHENSIVE METABOLIC PANEL
ALT: 31 U/L (ref 0–44)
AST: 26 U/L (ref 15–41)
Albumin: 2.6 g/dL — ABNORMAL LOW (ref 3.5–5.0)
Alkaline Phosphatase: 57 U/L (ref 38–126)
Anion gap: 9 (ref 5–15)
BUN: 21 mg/dL (ref 8–23)
CO2: 27 mmol/L (ref 22–32)
Calcium: 8.9 mg/dL (ref 8.9–10.3)
Chloride: 97 mmol/L — ABNORMAL LOW (ref 98–111)
Creatinine, Ser: 1 mg/dL (ref 0.61–1.24)
GFR, Estimated: >60 mL/min (ref >60)
Glucose, Bld: 91 mg/dL (ref 70–99)
Potassium: 4.2 mmol/L (ref 3.5–5.1)
Sodium: 133 mmol/L — ABNORMAL LOW (ref 135–145)
Total Bilirubin: 0.6 mg/dL (ref 0.3–1.2)
Total Protein: 6.2 g/dL — ABNORMAL LOW (ref 6.5–8.1)

## 2021-08-02 MED ORDER — POLYETHYLENE GLYCOL 3350 17 G PO PACK
17.0000 g | PACK | Freq: Every day | ORAL | Status: DC
  Administered 2021-08-02 – 2021-08-04 (×3): 17 g via ORAL
  Filled 2021-08-02 (×4): qty 1

## 2021-08-02 MED ORDER — FAMOTIDINE 20 MG PO TABS
20.0000 mg | ORAL_TABLET | Freq: Every day | ORAL | Status: DC
  Administered 2021-08-02 – 2021-08-05 (×4): 20 mg via ORAL
  Filled 2021-08-02 (×4): qty 1

## 2021-08-02 MED ORDER — HYDROXYZINE HCL 25 MG PO TABS
25.0000 mg | ORAL_TABLET | Freq: Once | ORAL | Status: AC
  Administered 2021-08-02: 25 mg via ORAL
  Filled 2021-08-02: qty 1

## 2021-08-02 MED ORDER — SODIUM CHLORIDE 0.9 % IV SOLN
12.5000 mg | Freq: Once | INTRAVENOUS | Status: AC
  Administered 2021-08-02: 12.5 mg via INTRAVENOUS
  Filled 2021-08-02: qty 0.5

## 2021-08-02 NOTE — Progress Notes (Signed)
[facility] ? ?PROGRESS NOTE ? ?[name] RXV:[id number] DOB: 1933/02/05 DOA: 07/30/2021 ?PCP: Lajean Manes, MD ? ? ?Brief HPI:   ?88 year old male with medical history of chronic diastolic heart failure, hypertension, bullous pemphigoid, asthma, BPH, hyperlipidemia presented with hypotension.  He was recently mated for acute on chronic diastolic CHF.  Diuresed well.  He was sent to skilled nursing facility.  At discharge he was placed on Lasix 40 mg p.o. daily.  At skilled facility he has been feeling weak.  His blood pressure was found to be low at 72/45.  EMS was called and was brought to ED for further evaluation. ? ? ?Subjective  ? ?Patient seen and examined, denies any complaints.  He is now requiring 2 L/min of oxygen ? ? Assessment/Plan:  ? ? ? ?Acute kidney injury ?-Creatinine improved to 1.0 ?-IV fluids have been discontinued due to mild fluid overload ?-Patient baseline creatinine is around 1.1-1.2 ?-Follow BMP in am ? ?Right lower extremity cellulitis ?-Patient noted to have significant erythema of right lower extremity ?-Improving with IV Rocephin ? ?UTI ?-Patient was found to have abnormal UA on admission ?-He was empirically started on IV Rocephin ?-Urine culture was not obtained in the ED ?-Urine culture obtained yesterday shows 20,000 colonies per mL E. Coli; final sensitivity pending ?-Continue IV Rocephin ? ?Hypotension ?-Resolved with fluid resuscitation ? ?Hyponatremia ?-Chronic ?-Likely from dehydration ?-Sodium improved to 133 ?-Continue normal saline ? ?Hyperkalemia ?-Resolved ? ?Hypertension ?-Blood pressure is now elevated ?-Patient was on Catapres 0.2 mg p.o. twice daily, Toprol-XL 150 mg daily ?-Restarted at low-dose of Catapres 0.1 mg p.o. twice daily and Toprol XL 100 mg daily ?-Blood pressure is stable ? ? ?Chronic diastolic CHF ?-Restarted Lasix 20 mg daily as patient requiring 2 L pulmonary off oxygen ? ?Bullous pemphigoid ?-Methotrexate on hold ? ? ?Medications ? ?  ?  atorvastatin  10 mg Oral Daily  ? cholecalciferol  5,000 Units Oral Daily  ? cloNIDine  0.1 mg Oral BID  ? cycloSPORINE  1 drop Both Eyes BID  ? enoxaparin (LOVENOX) injection  40 mg Subcutaneous Q24H  ? finasteride  5 mg Oral Daily  ? furosemide  20 mg Oral Daily  ? gabapentin  100 mg Oral BID  ? metoprolol succinate  100 mg Oral Daily  ? montelukast  10 mg Oral Daily  ? ? ? Data Reviewed:  ? ?CBG: ? ?Recent Labs  ?Lab 07/30/21 ?1317  ?GLUCAP 127*  ? ? ?SpO2: 92 % ?O2 Flow Rate (L/min): 2 L/min  ? ? ?Vitals:  ? 08/02/21 0439 08/02/21 0443 08/02/21 0853 08/02/21 1000  ?BP:  (!) 164/77 138/76   ?Pulse:  73 75   ?Resp:  20    ?Temp:  98.1 ?F (36.7 ?C) (!) 97.5 ?F (36.4 ?C) 98.3 ?F (36.8 ?C)  ?TempSrc:  Oral Oral   ?SpO2:  90% 92%   ?Weight: 111.4 kg     ?Height:      ? ? ? ? ?Data Reviewed: ? ?Basic Metabolic Panel: ?Recent Labs  ?Lab 07/30/21 ?1251 07/31/21 ?5093 08/01/21 ?0440 08/02/21 ?0432  ?NA 129* 132* 132* 133*  ?K 5.2* 4.5 4.3 4.2  ?CL 89* 91* 96* 97*  ?CO2 33* 32 26 27  ?GLUCOSE 113* 99 101* 91  ?BUN 43* 43* 29* 21  ?CREATININE 1.58* 1.46* 1.02 1.00  ?CALCIUM 9.0 9.3 8.7* 8.9  ? ? ?CBC: ?Recent Labs  ?Lab 07/30/21 ?1251 07/31/21 ?2671 08/02/21 ?0432  ?WBC 8.8 8.5 5.5  ?NEUTROABS  7.3  --   --   ?HGB 11.1* 11.1* 10.4*  ?HCT 33.6* 34.3* 31.5*  ?MCV 104.3* 106.5* 104.0*  ?PLT 243 151 124*  ? ? ?LFT ?Recent Labs  ?Lab 07/30/21 ?1251 07/31/21 ?8453 08/02/21 ?0432  ?AST 35 23 26  ?ALT 26 32 31  ?ALKPHOS 64 68 03  ?BILITOT 1.8* 0.5 0.6  ?PROT 6.9 6.7 6.2*  ?ALBUMIN 2.7* 2.9* 2.6*  ? ?  ?Antibiotics: ?Anti-infectives (From admission, onward)  ? ? Start     Dose/Rate Route Frequency Ordered Stop  ? 07/30/21 1515  cefTRIAXone (ROCEPHIN) 2 g in sodium chloride 0.9 % 100 mL IVPB       ? 2 g ?200 mL/hr over 30 Minutes Intravenous Every 24 hours 07/30/21 1503    ? ?  ? ? ? ?DVT prophylaxis: Lovenox ? ?Code Status: Full code ? ?Family Communication: No family at bedside ? ? ?CONSULTS  ? ? ?Objective  ? ? ?Physical  Examination: ? ? ?General-appears in no acute distress ?Heart-S1-S2, regular, no murmur auscultated ?Lungs-clear to auscultation bilaterally, no wheezing or crackles auscultated ?Abdomen-soft, nontender, no organomegaly ?Extremities-no edema in the lower extremities ?Neuro-alert, oriented x3, no focal deficit noted ? ?Status is: Inpatient: Patient admitted for UTI, AKI ? ? ? ?  ? ? ? ?[name] ?  ?[facility] ?If 7PM-7AM, please contact night-coverage at [email or web address], ?Office  [phone] ? ? ?08/02/2021, 3:05 PM  LOS: 1 day  ? ? ? ? ? ? ? ? ? ? ?  ?

## 2021-08-03 DIAGNOSIS — N179 Acute kidney failure, unspecified: Secondary | ICD-10-CM | POA: Diagnosis not present

## 2021-08-03 DIAGNOSIS — L03115 Cellulitis of right lower limb: Secondary | ICD-10-CM | POA: Diagnosis not present

## 2021-08-03 DIAGNOSIS — N3000 Acute cystitis without hematuria: Secondary | ICD-10-CM | POA: Diagnosis not present

## 2021-08-03 DIAGNOSIS — E871 Hypo-osmolality and hyponatremia: Secondary | ICD-10-CM | POA: Diagnosis not present

## 2021-08-03 LAB — URINE CULTURE: Culture: 20000 — AB

## 2021-08-03 MED ORDER — HYDROXYZINE HCL 25 MG PO TABS
25.0000 mg | ORAL_TABLET | Freq: Once | ORAL | Status: AC
  Administered 2021-08-03: 25 mg via ORAL
  Filled 2021-08-03: qty 1

## 2021-08-03 MED ORDER — MILK AND MOLASSES ENEMA
1.0000 | Freq: Once | RECTAL | Status: AC
  Administered 2021-08-03: 240 mL via RECTAL
  Filled 2021-08-03: qty 240

## 2021-08-03 NOTE — Progress Notes (Addendum)
[facility] ? ?PROGRESS NOTE ? ?[name] [id number]:[id number] DOB: March 29, 1933 DOA: 07/30/2021 ?PCP: Lajean Manes, MD ? ? ?Brief HPI:   ?88 year old male with medical history of chronic diastolic heart failure, hypertension, bullous pemphigoid, asthma, BPH, hyperlipidemia presented with hypotension.  He was recently mated for acute on chronic diastolic CHF.  Diuresed well.  He was sent to skilled nursing facility.  At discharge he was placed on Lasix 40 mg p.o. daily.  At skilled facility he has been feeling weak.  His blood pressure was found to be low at 72/45.  EMS was called and was brought to ED for further evaluation. ? ? ?Subjective  ? ?Patient seen and examined, denies abdominal pain.  Abdominal x-ray shows moderate stool burden.  Started on MiraLAX with no significant improvement. ? ? Assessment/Plan:  ? ? ? ?Acute kidney injury ?-Creatinine improved to 1.0 ?-IV fluids have been discontinued due to mild fluid overload ?-Patient baseline creatinine is around 1.1-1.2 ?-Follow BMP in am ? ?Right lower extremity cellulitis ?-Patient noted to have significant erythema of right lower extremity ?-Improving with IV Rocephin ?-Today day 5 of treatment ? ?UTI ?-Patient was found to have abnormal UA on admission ?-He was empirically started on IV Rocephin ?-Urine culture was not obtained in the ED ?-Urine culture obtained yesterday shows 20,000 colonies per mL E. Coli; f sensitive to Rocephin ?-Completed 5 days of treatment ? ?Hypotension ?-Resolved with fluid resuscitation ? ?Hyponatremia ?-Chronic ?-Likely from dehydration ?-Sodium improved to 133 ?-Continue normal saline ? ?Hyperkalemia ?-Resolved ? ?Hypertension ?-Blood pressure is now elevated ?-Patient was on Catapres 0.2 mg p.o. twice daily, Toprol-XL 150 mg daily ?-Restarted at low-dose of Catapres 0.1 mg p.o. twice daily and Toprol XL 100 mg daily ?-Blood pressure is stable ? ? ?Chronic diastolic CHF ?-Restarted Lasix 20 mg daily as patient requiring  2 L pulmonary off oxygen ? ?Bullous pemphigoid ?-Methotrexate on hold ? ?Constipation ?-No improvement with MiraLAX, milk and molasses enema x1 ? ?Medications ? ?  ? atorvastatin  10 mg Oral Daily  ? cholecalciferol  5,000 Units Oral Daily  ? cloNIDine  0.1 mg Oral BID  ? cycloSPORINE  1 drop Both Eyes BID  ? enoxaparin (LOVENOX) injection  40 mg Subcutaneous Q24H  ? famotidine  20 mg Oral Daily  ? finasteride  5 mg Oral Daily  ? furosemide  20 mg Oral Daily  ? gabapentin  100 mg Oral BID  ? metoprolol succinate  100 mg Oral Daily  ? montelukast  10 mg Oral Daily  ? polyethylene glycol  17 g Oral Daily  ? ? ? Data Reviewed:  ? ?CBG: ? ?Recent Labs  ?Lab 07/30/21 ?1317  ?GLUCAP 127*  ? ? ?SpO2: 92 % ?O2 Flow Rate (L/min): 2 L/min  ? ? ?Vitals:  ? 08/02/21 2014 08/03/21 0646 08/03/21 0917 08/03/21 1401  ?BP:  124/66 (!) 149/71 (!) 117/57  ?Pulse:  (!) 56 67 62  ?Resp: '20 18  16  '$ ?Temp: 97.9 ?F (36.6 ?C) 98 ?F (36.7 ?C)  97.9 ?F (36.6 ?C)  ?TempSrc: Oral Oral  Oral  ?SpO2: 91% (!) 87%  92%  ?Weight:      ?Height:      ? ? ? ? ?Data Reviewed: ? ?Basic Metabolic Panel: ?Recent Labs  ?Lab 07/30/21 ?1251 07/31/21 ?5537 08/01/21 ?0440 08/02/21 ?0432  ?NA 129* 132* 132* 133*  ?K 5.2* 4.5 4.3 4.2  ?CL 89* 91* 96* 97*  ?CO2 33* 32 26 27  ?GLUCOSE 113*  99 101* 91  ?BUN 43* 43* 29* 21  ?CREATININE 1.58* 1.46* 1.02 1.00  ?CALCIUM 9.0 9.3 8.7* 8.9  ? ? ?CBC: ?Recent Labs  ?Lab 07/30/21 ?1251 07/31/21 ?2426 08/02/21 ?0432  ?WBC 8.8 8.5 5.5  ?NEUTROABS 7.3  --   --   ?HGB 11.1* 11.1* 10.4*  ?HCT 33.6* 34.3* 31.5*  ?MCV 104.3* 106.5* 104.0*  ?PLT 243 151 124*  ? ? ?LFT ?Recent Labs  ?Lab 07/30/21 ?1251 07/31/21 ?8341 08/02/21 ?0432  ?AST 35 23 26  ?ALT 26 32 31  ?ALKPHOS 96 22 29  ?BILITOT 1.8* 0.5 0.6  ?PROT 6.9 6.7 6.2*  ?ALBUMIN 2.7* 2.9* 2.6*  ? ?  ?Antibiotics: ?Anti-infectives (From admission, onward)  ? ? Start     Dose/Rate Route Frequency Ordered Stop  ? 07/30/21 1515  cefTRIAXone (ROCEPHIN) 2 g in sodium chloride 0.9 % 100  mL IVPB       ? 2 g ?200 mL/hr over 30 Minutes Intravenous Every 24 hours 07/30/21 1503 08/04/21 1514  ? ?  ? ? ? ?DVT prophylaxis: Lovenox ? ?Code Status: Full code ? ?Family Communication: No family at bedside ? ? ?CONSULTS  ? ? ?Objective  ? ? ?Physical Examination: ? ?General-appears in no acute distress ?Heart-S1-S2, regular, no murmur auscultated ?Lungs-clear to auscultation bilaterally, no wheezing or crackles auscultated ?Abdomen-soft, nontender, no organomegaly ?Extremities-no edema in the lower extremities ?Neuro-alert, oriented x3, no focal deficit noted ? ?Status is: Inpatient: Patient admitted for UTI, AKI ? ? ? ?  ? ? ? ?[name] ?  ?[facility] ?If 7PM-7AM, please contact night-coverage at [email or web address], ?Office  [phone] ? ? ?08/03/2021, 3:10 PM  LOS: 2 days  ? ? ? ? ? ? ? ? ? ? ?  ?

## 2021-08-03 NOTE — Plan of Care (Signed)
?  Problem: Clinical Measurements: ?Goal: Will remain free from infection ?Outcome: Progressing ?Goal: Diagnostic test results will improve ?Outcome: Progressing ?Goal: Respiratory complications will improve ?Outcome: Progressing ?  ?Problem: Activity: ?Goal: Risk for activity intolerance will decrease ?Outcome: Not Progressing ?  ?

## 2021-08-04 DIAGNOSIS — N179 Acute kidney failure, unspecified: Secondary | ICD-10-CM | POA: Diagnosis not present

## 2021-08-04 DIAGNOSIS — N3000 Acute cystitis without hematuria: Secondary | ICD-10-CM | POA: Diagnosis not present

## 2021-08-04 DIAGNOSIS — E871 Hypo-osmolality and hyponatremia: Secondary | ICD-10-CM | POA: Diagnosis not present

## 2021-08-04 LAB — CULTURE, BLOOD (ROUTINE X 2): Culture: NO GROWTH

## 2021-08-04 MED ORDER — HYDROXYZINE HCL 25 MG PO TABS
25.0000 mg | ORAL_TABLET | Freq: Once | ORAL | Status: AC
  Administered 2021-08-04: 25 mg via ORAL
  Filled 2021-08-04: qty 1

## 2021-08-04 MED ORDER — PROSOURCE PLUS PO LIQD
30.0000 mL | Freq: Two times a day (BID) | ORAL | Status: DC
  Administered 2021-08-04: 30 mL via ORAL
  Filled 2021-08-04 (×2): qty 30

## 2021-08-04 MED ORDER — ADULT MULTIVITAMIN W/MINERALS CH
1.0000 | ORAL_TABLET | Freq: Every day | ORAL | Status: DC
Start: 2021-08-04 — End: 2021-08-05
  Administered 2021-08-04 – 2021-08-05 (×2): 1 via ORAL
  Filled 2021-08-04 (×2): qty 1

## 2021-08-04 NOTE — TOC Progression Note (Signed)
Transition of Care (TOC) - Progression Note  ? ? ?Patient Details  ?Name: [name] ?MRN: [id number] ?Date of Birth: 1932-08-20 ? ?Transition of Care (TOC) CM/SW Contact  ?Dessa Phi, RN ?Phone Number: ?08/04/2021, 1:03 PM ? ?Clinical Narrative: Josem Kaufmann initiated for [facility] through [facility] [id number] auth ID.   ? ? ? ?Expected Discharge Plan: [place] ?Barriers to Discharge: Insurance Authorization ? ?Expected Discharge Plan and Services ?Expected Discharge Plan: [place] ?  ?Discharge Planning Services: CM Consult ?  ?Living arrangements for the past 2 months: [place] ?                ?  ?  ?  ?  ?  ?  ?  ?  ?  ?  ? ? ?Social Determinants of Health (SDOH) Interventions ?  ? ?Readmission Risk Interventions ?No flowsheet data found. ? ?

## 2021-08-04 NOTE — Progress Notes (Signed)
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  Immediately [name] expressed his distress around not knowing where he was going upon discharge.  He thought that the hospital was going to discharge him without anywhere to go.  Chaplain provided some explanation around the process of discharge for [name] and how the Case Worker was looking for options for him.  Chaplain assured him that we would never "just put him out on the street" as he stated.   ? ?During visit, [name] also shared about his friends and healthcare agents.  He has friends that are from [place] that stay at his home when he is hospitalized or in need of some direct care.  [name] voiced that he doesn't have any family left.  He chose his friends as his POA's because they were the closest people to him at the time.   ? ?[name] throughout the visit shared that he has been feeling some emotions around not knowing what comes next.  He voiced not being able to name those feelings. Chaplain helped to name that as "grief" especially through all the transitions he has been experiencing.  Chaplain assessed that changes and transitions may be hard for [name], but he is also thinking deeply about what his future looks like as someone who doesn't have any family with his "best interest at heart."  Chaplain affirmed his current state of emotions and also offered assurance that as long as he has the capacity to make decisions for himself, the medical team will always ask him what he would like to do.  ? ?The Case Worker dropped off a list of places [name] would be able to go to for rehab.  [name] found it challenging at first to understand that he could only choose from what was highlighted.  Chaplain explained that to him a number of times.  [name] and Chaplain looked at each one and looked at the ratings of each facility.  [name] ultimately chose two facilities that he was able to relay back to the Case Worker.  Those choices included: [name]  and [name].  ? ?Chaplain spent some time with [name] as he shared about being overwhelmed and fearful of what is to come.  Chaplain worked to offer him some assurance and clarity around the process he is going through.  Ultimately, he would like to get back to his home. Chaplain offered a compassionate presence, reflective listening and support.  ? ? ? 08/04/21 1200  ?Clinical Encounter Type  ?Visited With Patient  ?Visit Type Initial;Spiritual support;Social support  ?Consult/Referral To Chaplain  ?Stress Factors  ?Patient Stress Factors Exhausted;Lack of caregivers;Lack of knowledge;Major life changes  ? ? ?

## 2021-08-04 NOTE — TOC Progression Note (Addendum)
Transition of Care (TOC) - Progression Note  ? ? ?Patient Details  ?Name: [name] ?MRN: [id number] ?Date of Birth: [birth year] ? ?Transition of Care (TOC) CM/SW Contact  ?Dessa Phi, RN ?Phone Number: ?08/04/2021, 9:51 AM ? ?Clinical Narrative:  Bed offers will be provided;Rep Eduard Clos also wants [facility] as a choice-will confirm if able to provide. ? ?-spoke to patient about choices-informed the 2 ways the hospital assts with providing the resource for you to make an independent decision-medicare.gov website(on the list of bed offers provided;also calling the facility directly) patient asked for [facility] to call him so he can ask questions-[facility] rep Janie called-patient had no questions. ?-[facility] rep Claiborne Billings has no male beds. ?-patient chose [facility] rep Janie aware. Starting auth. ?-patient has now spoke to [facility] rep Audelia Acton & chose [facility]-will start auth once PT available. ?-patient adamant about stating he is the decision Darlyn Chamber will talk to Eduard Clos himself w/updates on his choice. ?.1. ?1.3 mi ?[facility] ?[address] ?[place], [place] [address] ?[phone] ?Overall rating ?Average ?2. ?1.6 mi ?[facility] ?[place] ?[place], [place] [address] ?[phone] ?Overall rating ?Much below average ?3. ?2.1 mi ?[place] ?[place] ?[place], [place] [address] ?[phone] ?Overall rating ?Much below average ?4. ?2.5 mi ?[facility] ?[address] ?[place], [place] [address] ?[phone] ?Overall rating ?Below average ?5. ?2.8 mi ?[facility] ?[place], [place] [address] ?[phone] ?Overall rating ?Average ?6. ?2.8 mi ?[place] ?[address] ?[place], [place] [address] ?[phone] ?Overall  rating ?Much below average ?7. ?3 mi ?[place] ?[place] ?[place], [place] [address] ?[phone] ?Overall rating ?Much above average ?8. ?3.6 mi ?[place] ?[place] ?[place], [place] [address] ?[phone] ?Overall rating ?Average ?9. ?3.6 mi ?[facility] ?[place] ?[place], [place] [address] ?[phone] ?Overall rating ?Much below average ?10. ?3.9 mi ?[place] ?[place] ?[place], [place] [address] ?[phone] ?Overall rating ?Much below average ?11. ?4.4 mi ?[facility] ?[place] ?[place], [place] [address] ?[phone] ?Overall rating ?Much above average ?12. ?5.5 mi ?[place] ?[address] ?[place], [place] [address] ?[phone] ?Overall rating ?Above average ?13. ?8.2 mi ?[place] ?[place] ?[place], [place] [address] ?[phone] ?Overall rating ?Much above average ?14. ?9 mi ?[facility] ?[address] ?[place], [place] [address] ?[phone] ?Overall rating ?Above average ?15. ?9.1 mi ?[facility] ?[place] ?[place],  [address] ?[phone] ?Overall rating ?Average ?16. ?9.2 mi ?[place] ?[address] ?[place], [place] [address] ?[phone] ?Overall rating ?Much above average ?17. ?10.8 mi ?[place] [facility] [place] ?[place] ?[place],  [address] ?[phone] ?Overall rating ?Much above average ?18. ?12.6 mi ?[facility] ?[place],  [address] ?[phone] ?Overall rating ?Much below average ?19. ?12.8 mi ?[place] ?[address] ?[place],  [address] ?[phone] ?Overall rating ?Much below average ?20. ?14.2 mi ?[facility] ?[address] ?[place],  [address] ?[phone] ?Overall rating ?Below  average ?21. ?14.4 mi ?[facility] ?[address] ?[place], [place] [address] ?[phone] ?Overall rating ?Above average ?22. ?14.8 mi ?[place] ?[address] ?[place], [place] [address] ?[phone] ?Overall rating ?Much above average ?23. ?14.9 mi ?[place] ?[place] ?[place], [place] [address] ?[phone] ?Overall rating ?Much below average ?24. ?16.5 mi ?[facility] ?[address] ?[place], [place] [address] ?[phone] ?Overall rating ?Average ?25. ?16.7 mi ?[place] ?[place] ?[place], [place] [address] ?[phone] ?Overall rating ?Much above average ?26. ?17.9 mi ?[place] ?[address] ?[place], [place] [address] ?[phone] ?Overall rating ?Below average ?27. ?18.1 mi ?[facility] ?[address] ?[place], [place] [address] ?[phone] ?Overall rating ?Much below average ?28. ?19.7 mi ?[place] ?[place], [place] [address] ?[phone] ?Overall rating ?Much below average ?29. ?20 mi ?[facility] [place] ?[place], [place] [address] ?[phone] ?Overall rating ?Much above average ?30. ?21.1 mi ?[place] ?[place] ?[place], [place] [address] ?[phone] ?Overall rating ?Much below average ?31. ?21.6 mi ?[facility] ?[place] ?[place], [place] [address] ?[phone] ?Overall rating ?Below average ?32. ?21.6 mi ?[facility] ?[place] ?[place], [place] [address] ?[phone] ?Overall rating ?Average ?33. ?21.8 mi ?[place] ?[place] ?[place], [place] [address] ?[phone] ?Overall rating ?Much above average ?34. ?22 mi ?[facility] ?[address] ?[place], [place]  [address] ?[phone] ?Overall rating ?Much above average ?35. ?22.6 mi ?[place] ?[address] ?[place], [place] [address] ?[phone] ?Overall rating ?Average ?36. ?22.7 mi ?[facility] ?[place], [place] [address] ?[phone] ?Overall rating ?Much below average ?37. ?23.3 mi ?[facility] ?[address] ?[place], [place] [address] ?[phone] ?Overall rating ?Above average ?38. ?23.5 mi ?[place] ?[address] ?[place], [place] [address] ?[phone] ?Overall rating ?Not available18 ?39. ?24.1 mi ?[facility] ?[address] ?[place], [place] [address] ?[phone] ?Overall rating ?Below average ?40. ?24.2 mi ?[place] ?[address] ?[place], [place] [address] ?[phone] ?Overall rating ?Below average ?41. ?24.4 mi ?[facility] ?[place] ?[place], [place] [address] ?[phone] ?Overall rating ?Much below average ?42. ?24.5 mi ?[place] ?[address] ?[place], [place] [address] ?[phone] ?Overall rating ?Above average ?To explore and download  ? ? ? ?Expected Discharge Plan: [place] ?Barriers to Discharge: Continued Medical Work up ? ?Expected Discharge Plan and Services ?Expected Discharge Plan: [place] ?  ?Discharge Planning Services: CM Consult ?  ?Living arrangements for the past 2 months: [place] ?                ?  ?  ?  ?  ?  ?  ?  ?  ?  ?  ? ? ?Social Determinants of Health (SDOH) Interventions ?  ? ?Readmission Risk Interventions ?No flowsheet data found. ? ?

## 2021-08-04 NOTE — Care Management Important Message (Signed)
Important Message ? ?Patient Details IM Letter given to the Patient. ?Name: [name] ?MRN: [id number] ?Date of Birth: [birth year] ? ? ?Medicare Important Message Given:  Yes ? ? ? ? ?Kerin Salen ?08/04/2021, 2:41 PM ?

## 2021-08-04 NOTE — Progress Notes (Signed)
Physical Therapy Treatment ?Patient Details ?Name: [name] ?MRN: [id number] ?DOB: [birth year] ?Today's Date: 08/04/2021 ? ? ?History of Present Illness 88 year old male with medical history of chronic diastolic heart failure, hypertension, bullous pemphigoid, asthma, BPH, hyperlipidemia presented with hypotension.  He was recently mated for acute on chronic diastolic CHF.  Diuresed well.  He was sent to skilled nursing facility.  At discharge he was placed on Lasix 40 mg p.o. daily.  At skilled facility he has been feeling weak.  His blood pressure was found to be low at 72/45.  EMS was called and was brought to ED for further evaluation. ? ?  ?PT Comments  ? ? 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  "I haven't been up in a whaile".  Pt could not recall what he was doing/performing at SNF prior to hospital.  Assisted OOB to recliner required + 2 assist. ?General bed mobility comments: required Max Assist + 2 due to weakness and c/o R shoulder pain with limited functional use.  Also used bed pad to complete scooting to EOB.  Once EOB present with Mod posterior lean and inablility to self correct.  Pt c/o he can not feel his feet on the floor.  "numb".  Poor self correction with heavy use b UE's to stay upright/midline. General transfer comment: with poor sitting balance and increased fear of falling/anxiety.  Used + 2 side by side assist no AD so we could be tight and close to pt, pt was able to rise from elevated bed at Mod Assist and able to support his own weight. Required 75% VC's to decrease anxiety and guide pt to side step 1/4 turn to recliner.  Pt was able to take a few steps and complete turn but needed guidance to lower self to recliner.  "I'm falling". General Gait Details: transfer only this session due to pt limited transfer ability. ?Pt will need ST Rehab at SNF to address his decline in functional mobility  and level of prior Indep. ?  ?Recommendations for follow up therapy are one component of a multi-disciplinary discharge planning process, led by the attending physician.  Recommendations may be updated based on patient status, additional functional criteria and insurance authorization. ? ?Follow Up Recommendations ? Skilled nursing-short term rehab (<3 hours/day) ?  ?  ?Assistance Recommended at Discharge Frequent or constant Supervision/Assistance  ?Patient can return home with the following A lot of help with walking and/or transfers;Assistance with cooking/housework;A lot of help with bathing/dressing/bathroom;Assist for transportation ?  ?Equipment Recommendations ? None recommended by PT  ?  ?Recommendations for Other Services   ? ? ?  ?Precautions / Restrictions Precautions ?Precautions: Fall ?Precaution Comments: BLE wounds + R LE cellultis ?Restrictions ?Weight Bearing Restrictions: No  ?  ? ?Mobility ? Bed Mobility ?Overal bed mobility: Needs Assistance ?Bed Mobility: Supine to Sit ?  ?  ?Supine to sit: Max assist, +2 for physical assistance, +2 for safety/equipment ?  ?  ?General bed mobility comments: required Max Assist + 2 due to weakness and c/o R shoulder pain with limited functional use.  Also used bed pad to complete scooting to EOB.  Once EOB present with Mod posterior lean and inablility to self correct.  Pt c/o he can not feel his feet on the floor.  "numb".  Poor self correction with heavy use b UE's to stay upright/midline. ?  ? ?Transfers ?Overall transfer level: Needs assistance ?Equipment used:  None ?Transfers: Bed to chair/wheelchair/BSC ?  ?Stand pivot transfers: Max assist, +2 physical assistance, +2 safety/equipment ?  ?  ?  ?  ?General transfer comment: with poor sitting balance and increased fear of falling/anxiety.  Used + 2 side by side assist no AD so we could be tight and close to pt, pt was able to rise from elevated bed at Mod Assist and able to support his own weight. Required  75% VC's to decrease anxiety and guide pt to side step 1/4 turn to recliner.  Pt was able to take a few steps and complete turn but needed guidance to lower self to recliner.  "I'm falling". ?  ? ?Ambulation/Gait ?  ?  ?  ?  ?  ?  ?  ?General Gait Details: transfer only this session due to pt limited transfer ability. ? ? ?Stairs ?  ?  ?  ?  ?  ? ? ?Wheelchair Mobility ?  ? ?Modified Rankin (Stroke Patients Only) ?  ? ? ?  ?Balance   ?  ?  ?  ?  ?  ?  ?  ?  ?  ?  ?  ?  ?  ?  ?  ?  ?  ?  ?  ? ?  ?Cognition Arousal/Alertness: Awake/alert ?Behavior During Therapy: Anxious ?Overall Cognitive Status: No family/caregiver present to determine baseline cognitive functioning ?  ?  ?  ?  ?  ?  ?  ?  ?  ?  ?  ?  ?  ?  ?  ?  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  "I haven't been up in a whaile".  Pt could not recall what he was doing/performing at SNF prior to hospital ?  ?  ? ?  ?Exercises   ? ?  ?General Comments   ?  ?  ? ?Pertinent Vitals/Pain Pain Assessment ?Pain Assessment: Faces ?Faces Pain Scale: Hurts a little bit ?Pain Location: R shoulder pain ?Pain Descriptors / Indicators: Aching, Grimacing, Guarding ?Pain Intervention(s): Monitored during session, Repositioned  ? ? ?Home Living   ?  ?  ?  ?  ?  ?  ?  ?  ?  ?   ?  ?Prior Function    ?  ?  ?   ? ?PT Goals (current goals can now be found in the care plan section) Progress towards PT goals: Progressing toward goals ? ?  ?Frequency ? ? ? Min 2X/week ? ? ? ?  ?PT Plan Current plan remains appropriate  ? ? ?Co-evaluation   ?  ?  ?  ?  ? ?  ?AM-PAC PT "6 Clicks" Mobility   ?Outcome Measure ? Help needed turning from your back to your side while in a flat bed without using bedrails?: A Lot ?Help needed moving from lying on your back to sitting on the side of a flat bed without using bedrails?: A Lot ?Help needed moving to and from a bed to a chair (including a wheelchair)?: A  Lot ?Help needed standing up from a chair using your arms (e.g., wheelchair or bedside chair)?: A Lot ?Help needed to walk in hospital room?: Total ?Help needed climbing 3-5 steps with a railing? : Total ?6 Click Score: 10 ? ?  ?End of Session Equipment Utilized During Treatment: Gait belt ?Activity Tolerance: Patient limited by fatigue;Other (comment) (anxiety) ?Patient left: in chair;with call bell/phone within reach;with  chair alarm set ?Nurse Communication: Mobility status ?PT Visit Diagnosis: Other abnormalities of gait and mobility (R26.89);Muscle weakness (generalized) (M62.81) ?  ? ? ?Time: 0277-4128 ?PT Time Calculation (min) (ACUTE ONLY): 17 min ? ?Charges:  $Therapeutic Activity: 8-22 mins          ?          ?Rica Koyanagi  PTA ?Acute  Rehabilitation Services ?Pager      [phone] ?Office      [phone] ? ?

## 2021-08-04 NOTE — Progress Notes (Signed)
[facility] ? ?PROGRESS NOTE ? ?MORT SMELSER EHO:[id number] DOB: [birth year] DOA: 07/30/2021 ?PCP: Lajean Manes, MD ? ? ?Brief HPI:   ?88 year old male with medical history of chronic diastolic heart failure, hypertension, bullous pemphigoid, asthma, BPH, hyperlipidemia presented with hypotension.  He was recently mated for acute on chronic diastolic CHF.  Diuresed well.  He was sent to skilled nursing facility.  At discharge he was placed on Lasix 40 mg p.o. daily.  At skilled facility he has been feeling weak.  His blood pressure was found to be low at 72/45.  EMS was called and was brought to ED for further evaluation. ? ? ?Subjective  ? ?Patient seen and examined, denies abdominal pain.  Had large BM with milk and molasses enema given yesterday. ? ? Assessment/Plan:  ? ? ? ?Acute kidney injury ?-Creatinine improved to 1.0 ?-IV fluids have been discontinued due to mild fluid overload ?-Patient baseline creatinine is around 1.1-1.2 ?-Follow BMP in am ? ?Right lower extremity cellulitis ?-Patient noted to have significant erythema of right lower extremity ?-Improving with IV Rocephin ?-Completed 5 days of antibiotic treatment with Rocephin. ? ?UTI ?-Patient was found to have abnormal UA on admission ?-He was empirically started on IV Rocephin ?-Urine culture was not obtained in the ED ?-Urine culture obtained yesterday shows 20,000 colonies per mL E. Coli;  sensitive to Rocephin ?-Completed 5 days of treatment ? ?Hypotension ?-Resolved with fluid resuscitation ? ?Hyponatremia ?-Chronic ?-Likely from dehydration ?-Sodium improved to 133 ?-Continue normal saline ? ?Hyperkalemia ?-Resolved ? ?Hypertension ?-Blood pressure is now elevated ?-Patient was on Catapres 0.2 mg p.o. twice daily, Toprol-XL 150 mg daily ?-Restarted at low-dose of Catapres 0.1 mg p.o. twice daily and Toprol XL 100 mg daily ?-Blood pressure is stable ? ? ?Chronic diastolic CHF ?-Restarted Lasix 20 mg daily as patient requiring 2 L  pulmonary off oxygen ? ?Bullous pemphigoid ?-Methotrexate on hold ? ?Constipation ?-No improvement with MiraLAX, milk and molasses enema x1 ? ?Medications ? ?  ? (feeding supplement) PROSource Plus  30 mL Oral BID BM  ? atorvastatin  10 mg Oral Daily  ? cholecalciferol  5,000 Units Oral Daily  ? cloNIDine  0.1 mg Oral BID  ? cycloSPORINE  1 drop Both Eyes BID  ? enoxaparin (LOVENOX) injection  40 mg Subcutaneous Q24H  ? famotidine  20 mg Oral Daily  ? finasteride  5 mg Oral Daily  ? furosemide  20 mg Oral Daily  ? gabapentin  100 mg Oral BID  ? metoprolol succinate  100 mg Oral Daily  ? montelukast  10 mg Oral Daily  ? multivitamin with minerals  1 tablet Oral Daily  ? polyethylene glycol  17 g Oral Daily  ? ? ? Data Reviewed:  ? ?CBG: ? ?Recent Labs  ?Lab 07/30/21 ?1317  ?GLUCAP 127*  ? ? ?SpO2: 95 % ?O2 Flow Rate (L/min): 2 L/min  ? ? ?Vitals:  ? 08/04/21 0634 08/04/21 0830 08/04/21 0832 08/04/21 1224  ?BP: (!) 155/88 (!) 164/82  (!) 142/77  ?Pulse: 65 (!) 58 68 68  ?Resp: '20 16  18  '$ ?Temp: 98.1 ?F (36.7 ?C) 98.2 ?F (36.8 ?C)  97.8 ?F (36.6 ?C)  ?TempSrc: Oral Oral    ?SpO2: 93% 95%  95%  ?Weight:      ?Height:      ? ? ? ? ?Data Reviewed: ? ?Basic Metabolic Panel: ?Recent Labs  ?Lab 07/30/21 ?1251 07/31/21 ?3704 08/01/21 ?0440 08/02/21 ?0432  ?NA 129* 132* 132* 133*  ?  K 5.2* 4.5 4.3 4.2  ?CL 89* 91* 96* 97*  ?CO2 33* 32 26 27  ?GLUCOSE 113* 99 101* 91  ?BUN 43* 43* 29* 21  ?CREATININE 1.58* 1.46* 1.02 1.00  ?CALCIUM 9.0 9.3 8.7* 8.9  ? ? ?CBC: ?Recent Labs  ?Lab 07/30/21 ?1251 07/31/21 ?6629 08/02/21 ?0432  ?WBC 8.8 8.5 5.5  ?NEUTROABS 7.3  --   --   ?HGB 11.1* 11.1* 10.4*  ?HCT 33.6* 34.3* 31.5*  ?MCV 104.3* 106.5* 104.0*  ?PLT 243 151 124*  ? ? ?LFT ?Recent Labs  ?Lab 07/30/21 ?1251 07/31/21 ?4765 08/02/21 ?0432  ?AST 35 23 26  ?ALT 26 32 31  ?ALKPHOS 46 50 35  ?BILITOT 1.8* 0.5 0.6  ?PROT 6.9 6.7 6.2*  ?ALBUMIN 2.7* 2.9* 2.6*  ? ?  ?Antibiotics: ?Anti-infectives (From admission, onward)  ? ? Start      Dose/Rate Route Frequency Ordered Stop  ? 07/30/21 1515  cefTRIAXone (ROCEPHIN) 2 g in sodium chloride 0.9 % 100 mL IVPB       ? 2 g ?200 mL/hr over 30 Minutes Intravenous Every 24 hours 07/30/21 1503 08/03/21 1628  ? ?  ? ? ? ?DVT prophylaxis: Lovenox ? ?Code Status: Full code ? ?Family Communication: No family at bedside ? ? ?CONSULTS  ? ? ?Objective  ? ? ?Physical Examination: ? ?General-appears in no acute distress ?Heart-S1-S2, regular, no murmur auscultated ?Lungs-clear to auscultation bilaterally, no wheezing or crackles auscultated ?Abdomen-soft, nontender, no organomegaly ?Extremities-no edema in the lower extremities ?Neuro-alert, oriented x3, no focal deficit noted ? ?Status is: Inpatient: Patient admitted for UTI, AKI ? ? ? ?  ? ? ? ?[name] ?  ?[facility] ?If 7PM-7AM, please contact night-coverage at [email or web address], ?Office  [phone] ? ? ?08/04/2021, 1:52 PM  LOS: 3 days  ? ? ? ? ? ? ? ? ? ? ?  ?

## 2021-08-04 NOTE — Progress Notes (Signed)
Initial Nutrition Assessment ? ?DOCUMENTATION CODES:  ? ?Obesity unspecified ? ?INTERVENTION:  ?- will order 30 ml Prosource Plus BID, each supplement provides 100 kcal and 15 grams protein.  ?- will order 1 tablet multivitamin with minerals daily ?- complete NFPE when feasible. ?- will place Low Sodium Nutrition Therapy handout in AVS. ? ? ?NUTRITION DIAGNOSIS:  ? ?Increased nutrient needs related to acute illness as evidenced by estimated needs. ? ?GOAL:  ? ?Patient will meet greater than or equal to 90% of their needs ? ?MONITOR:  ? ?PO intake, Supplement acceptance, Labs, Weight trends, I & O's ? ?REASON FOR ASSESSMENT:  ? ?Consult ?Assessment of nutrition requirement/status ? ?ASSESSMENT:  ? ?88 year old male with medical history of CHF, HTN, bullous pemphigoid, asthma, BPH, and HLD. He was recently admitted for acute on chronic CHF and was diuresed before being discharged to SNF. He returned to the ED due to hypotension and weakness at SNF and was admitted for UTI. ? ?Patient laying in bed with Chaplain at bedside. PT had stopped in and planned to return shortly to work with patient.  ? ?Visit and discussion brief and patient requests that NFPE not be done at the time of RD visit this AM.  ? ?He shares that appetite is usually very good but that this AM he has not been hungry d/t so much going on and feeling a bit overwhelmed. A bowl of frosted flakes with milk in front of him on bedside table.  ? ?Patient has not been seen by a [place] RD at any time in the past.  ? ?Weight on 3/18 was 245 lb, weight on 3/1 was 252 lb, and prior to this the most recently documented weight was 286 lb on 11/30/19. ? ?This indicates he has lost 7 lb (2.8% body weight) in the past 2.5 weeks. Suspect this is at least in part d/t fluid.  ? ?Moderate pitting edema to BLE documented in the edema section of flow sheet.  ? ? ?Labs reviewed; Na: 133 mmol/l, Cl: 97 mmol/l. ? ?Medications reviewed; 5000 units cholecalciferol/day, 20 mg  oral lasix/day, 17 g miralax/day. ?  ? ?NUTRITION - FOCUSED PHYSICAL EXAM: ? ?Patient requests this be completed another time. ? ?Diet Order:   ?Diet Order   ? ?       ?  Diet Heart Room service appropriate? Yes; Fluid consistency: Thin; Fluid restriction: 1500 mL Fluid  Diet effective now       ?  ? ?  ?  ? ?  ? ? ?EDUCATION NEEDS:  ? ?Not appropriate for education at this time ? ?Skin:  Skin Assessment: Skin Integrity Issues: ?Skin Integrity Issues:: Other (Comment) ?Other: Skin tears to R pre-tibia and R hand ? ?Last BM:  PTA/unknown ? ?Height:  ? ?Ht Readings from Last 1 Encounters:  ?07/31/21 '5\' 11"'$  (1.803 m)  ? ? ?Weight:  ? ?Wt Readings from Last 1 Encounters:  ?08/02/21 111.4 kg  ? ? ? ?BMI:  Body mass index is 34.25 kg/m?. ? ?Estimated Nutritional Needs:  ?Kcal:  1800-2000 ?Protein:  90-100 grams ?Fluid:  >/= 1.5 L/day ? ? ? ? ?Jarome Matin, MS, RD, LDN ?Registered Dietitian II ?Inpatient Clinical Nutrition ?RD pager # and on-call/weekend pager # available in [place]  ? ?

## 2021-08-04 NOTE — Discharge Instructions (Signed)
Low-Sodium Nutrition Therapy Eating less sodium can help you if you have high blood pressure, heart failure, or kidney or liver disease. Your body needs a little sodium, but too much sodium can cause your body to hold onto extra water.  This extra water will raise your blood pressure and can cause damage to your heart, kidneys, or liver as they are forced to work harder. Sometimes you can see how the extra fluid affects you because your hands, legs, or belly swell.  You may also hold water around your heart and lungs, which makes it hard to breathe. Even if you take medication for blood pressure or a water pill (diuretic) to remove fluid, it is still important to have less salt in your diet. Check with your primary care provider before drinking alcohol since it may affect the amount of fluid in your body and how your heart, kidneys, or liver work.  Sodium in Food A low-sodium meal plan limits the sodium that you get from food and beverages to 1,500-2,000 milligrams (mg) per day. Salt is the main source of sodium.  Read the nutrition label on the package to find out how much sodium is in one serving of a food. Select foods with 140 milligrams (mg) of sodium or less per serving. You may be able to eat one or two servings of foods with a little more than 140 milligrams (mg) of sodium if you are closely watching how much sodium you eat in a day. Check the serving size on the label. The amount of sodium listed on the label shows the amount in one serving of the food. So, if you eat more than one serving, you will get more sodium than the amount listed.  Cutting Back on Sodium Eat more fresh foods. Fresh fruits and vegetables are low in sodium, as well as frozen vegetables and fruits that have no added juices or sauces. Fresh meats are lower in sodium than processed meats, such as bacon, sausage, and hotdogs. Not all processed foods are unhealthy, but some processed foods may have too much  sodium. Eat less salt at the table and when cooking.  One of the ingredients in salt is sodium. One teaspoon of table salt has 2,300 milligrams of sodium. Leave the salt out of recipes for pasta, casseroles, and soups. Be a smart shopper. Food packages that say "Salt-free", sodium-free", "very low sodium," and "low sodium" have less than 140 milligrams of sodium per serving. Beware of products identified as "Unsalted," "No Salt Added," "Reduced Sodium," or "Lower Sodium."  These items may still be high in sodium. You should always check the nutrition label. Add flavors to your food without adding sodium. Try lemon juice, lime juice, or vinegar. Dry or fresh herbs add flavor. Buy a sodium-free seasoning blend or make your own at home. You can purchase salt-free or sodium-free condiments like barbeque sauce in stores and online.   Eating in Restaurants Choose foods carefully when you eat outside your home. Restaurant foods can be very high in sodium.  Many restaurants provide nutrition facts on their menus or their websites. If you cannot find that information, ask your server.  Let your server know that you want your food to be cooked without salt and that you would like your salad dressing and sauces to be served on the side.  Foods Recommended Grains Bread and rolls without salted tops Homemade bread made with reduced-sodium baking powder Cold cereals, especially shredded wheat and puffed rice Oats, grits, or   cream of wheat Pastas, quinoa, and rice Popcorn, pretzels or crackers without salt Corn tortillas Protein Foods Fresh meats and fish (check the nutrition labels - make sure they are not packaged in a sodium solution) Canned or packed tuna (no more than 4 ounces at 1 serving) Beans and peas Soybeans and tofu Eggs Nuts or nut butters without salt Dairy Milk or milk powder Plant milks, such as rice and soy Yogurt, including Greek yogurt Small amounts of natural cheese  (blocks of cheese) or reduced-sodium cheese can be used in moderation.  (Swiss, ricotta, and fresh mozzarella cheese are lower in sodium than the others) Cream Cheese Low sodium cottage cheese Vegetables Fresh and frozen vegetables without added sauces or salt Homemade soups (without salt) Low-sodium, salt-free or sodium-free canned vegetables and soups Fruit Fresh and canned fruits Dried fruits, such as raisins, cranberries, and prunes Oils Tub or liquid margarine, regular or without salt Canola, corn, peanut, olive, safflower, or sunflower oils Condiments Fresh or dried herbs such as basil, bay leaf, dill, mustard (dry), nutmeg, paprika, parsley, rosemary, sage, or thyme. Low sodium ketchup Vinegar Lemon or lime juice Pepper, red pepper flakes, and cayenne. Hot sauce contains sodium, but if you use just a drop or two, it will not add up to much. Salt-free or sodium-free seasoning mixes and marinades Simple salad dressings: vinegar and oil  Foods Not Recommended Grains Breads or crackers topped with salt Cereals (hot/cold) with more than 300 mg sodium per serving Biscuits, cornbread, and other "quick" breads prepared with baking soda Pre-packaged bread crumbs Seasoned and packaged rice and pasta mixes Self-rising flours Protein Foods Cured meats: Bacon, ham, sausage, pepperoni and hot dogs Canned meats (chili, vienna sausage, or sardines) Smoked fish and meats Frozen meals that have more than 600 mg of sodium per serving Egg substitute (with added sodium) Dairy Buttermilk Processed cheese spreads Cottage cheese (1 cup may have over 500 mg of sodium; look for low-sodium.) American or feta cheese Shredded cheese has more sodium than blocks of cheese String cheese Vegetables Canned vegetables (unless they are salt-free, sodium-free or low sodium) Frozen vegetables with seasoning and sauces Sauerkraut and pickled vegetables Canned or dried soups (unless they are  salt-free, sodium-free, or low sodium) French fries and onion rings Fruit  Dried fruits preserved with additives that have sodium Oils  Salted butter or margarine, all types of olives Condiments Salt, sea salt, kosher salt, onion salt, and garlic salt Seasoning mixes with salt Bouillon cubes Ketchup Barbeque sauce and Worcestershire sauce unless low sodium Soy sauce Salsa, pickles, olives, relish Salad dressings: ranch, blue cheese, Italian, and French.  

## 2021-08-05 DIAGNOSIS — R2681 Unsteadiness on feet: Secondary | ICD-10-CM | POA: Diagnosis not present

## 2021-08-05 DIAGNOSIS — D649 Anemia, unspecified: Secondary | ICD-10-CM | POA: Diagnosis not present

## 2021-08-05 DIAGNOSIS — E871 Hypo-osmolality and hyponatremia: Secondary | ICD-10-CM | POA: Diagnosis not present

## 2021-08-05 DIAGNOSIS — N3000 Acute cystitis without hematuria: Secondary | ICD-10-CM | POA: Diagnosis not present

## 2021-08-05 DIAGNOSIS — I952 Hypotension due to drugs: Secondary | ICD-10-CM

## 2021-08-05 DIAGNOSIS — R296 Repeated falls: Secondary | ICD-10-CM | POA: Diagnosis not present

## 2021-08-05 DIAGNOSIS — M1991 Primary osteoarthritis, unspecified site: Secondary | ICD-10-CM | POA: Diagnosis not present

## 2021-08-05 DIAGNOSIS — I890 Lymphedema, not elsewhere classified: Secondary | ICD-10-CM | POA: Diagnosis not present

## 2021-08-05 DIAGNOSIS — L03115 Cellulitis of right lower limb: Secondary | ICD-10-CM | POA: Diagnosis not present

## 2021-08-05 DIAGNOSIS — J45909 Unspecified asthma, uncomplicated: Secondary | ICD-10-CM | POA: Diagnosis not present

## 2021-08-05 DIAGNOSIS — N400 Enlarged prostate without lower urinary tract symptoms: Secondary | ICD-10-CM | POA: Diagnosis not present

## 2021-08-05 DIAGNOSIS — I959 Hypotension, unspecified: Secondary | ICD-10-CM | POA: Diagnosis not present

## 2021-08-05 DIAGNOSIS — M6281 Muscle weakness (generalized): Secondary | ICD-10-CM | POA: Diagnosis not present

## 2021-08-05 DIAGNOSIS — J9601 Acute respiratory failure with hypoxia: Secondary | ICD-10-CM | POA: Diagnosis not present

## 2021-08-05 DIAGNOSIS — R1312 Dysphagia, oropharyngeal phase: Secondary | ICD-10-CM | POA: Diagnosis not present

## 2021-08-05 DIAGNOSIS — F4320 Adjustment disorder, unspecified: Secondary | ICD-10-CM | POA: Diagnosis not present

## 2021-08-05 DIAGNOSIS — I5032 Chronic diastolic (congestive) heart failure: Secondary | ICD-10-CM | POA: Diagnosis not present

## 2021-08-05 DIAGNOSIS — S41102A Unspecified open wound of left upper arm, initial encounter: Secondary | ICD-10-CM | POA: Diagnosis not present

## 2021-08-05 DIAGNOSIS — I10 Essential (primary) hypertension: Secondary | ICD-10-CM | POA: Diagnosis not present

## 2021-08-05 DIAGNOSIS — S81801A Unspecified open wound, right lower leg, initial encounter: Secondary | ICD-10-CM | POA: Diagnosis not present

## 2021-08-05 DIAGNOSIS — N390 Urinary tract infection, site not specified: Secondary | ICD-10-CM | POA: Diagnosis not present

## 2021-08-05 DIAGNOSIS — I5033 Acute on chronic diastolic (congestive) heart failure: Secondary | ICD-10-CM | POA: Diagnosis not present

## 2021-08-05 DIAGNOSIS — Z7401 Bed confinement status: Secondary | ICD-10-CM | POA: Diagnosis not present

## 2021-08-05 LAB — CULTURE, BLOOD (ROUTINE X 2)
Culture: NO GROWTH
Special Requests: ADEQUATE

## 2021-08-05 MED ORDER — LORAZEPAM 0.5 MG PO TABS: 0.5000 mg | ORAL_TABLET | Freq: Four times a day (QID) | ORAL | 0 refills | Status: DC | PRN

## 2021-08-05 MED ORDER — TRAMADOL HCL 50 MG PO TABS: 50.0000 mg | ORAL_TABLET | Freq: Four times a day (QID) | ORAL | 0 refills | Status: DC | PRN

## 2021-08-05 MED ORDER — CLONIDINE HCL 0.1 MG PO TABS: 0.1000 mg | ORAL_TABLET | Freq: Two times a day (BID) | ORAL | 11 refills | Status: DC

## 2021-08-05 MED ORDER — FUROSEMIDE 20 MG PO TABS: 20.0000 mg | ORAL_TABLET | Freq: Every day | ORAL | 2 refills | Status: DC

## 2021-08-05 NOTE — Discharge Summary (Signed)
?Physician Discharge Summary ?  ?Patient: [name] MRN: [id number] DOB: Jun 01, 1932  ?Admit date:     07/30/2021  ?Discharge date: 08/05/21  ?Discharge Physician: [name]  ? ?PCP: Lajean Manes, MD  ? ?Recommendations at discharge:  ? ?Is done all was done patient to go to [facility] ? ?Discharge Diagnoses: ?Principal Problem: ?  UTI (urinary tract infection) ?Active Problems: ?  BPH (benign prostatic hyperplasia) ?  Hyponatremia ?  Hyperlipidemia ?  AKI (acute kidney injury) ([place]) ?  Bullous pemphigoid ?  Hypotension ?  Hyperkalemia ?  Cellulitis ? ?Resolved Problems: ?  * No resolved hospital problems. * ? ?Hospital Course: ?88 year old male with medical history of chronic diastolic heart failure, hypertension, bullous pemphigoid, asthma, BPH, hyperlipidemia presented with hypotension.  He was recently mated for acute on chronic diastolic CHF.  Diuresed well.  He was sent to skilled nursing facility.  At discharge he was placed on Lasix 40 mg p.o. daily.  At skilled facility he has been feeling weak.  His blood pressure was found to be low at 72/45.  EMS was called and was brought to ED for further evaluation. ?  ? ?Assessment and Plan: ? ?Acute kidney injury ?-Creatinine improved to 1.0 ?-IV fluids have been discontinued due to mild fluid overload ?-Patient baseline creatinine is around 1.1-1.2 ? ?  ?Right lower extremity cellulitis ?-Patient noted to have significant erythema of right lower extremity ?-Resolved with antibiotics ?-Completed 5 days of antibiotic treatment with Rocephin. ?  ?UTI ?-Patient was found to have abnormal UA on admission ?-He was empirically started on IV Rocephin ?-Urine culture was not obtained in the ED ?-Urine culture obtained yesterday shows 20,000 colonies per mL E. Coli;  sensitive to Rocephin ?-Completed 5 days of treatment ?  ?Hypotension ?-Resolved with fluid resuscitation ?  ?Hyponatremia ?-Chronic ?-Likely from dehydration ?-Sodium improved to 133 ? ?   ?Hyperkalemia ?-Resolved ?  ?Hypertension ?-Blood pressure was elevated after stopping Catapres ?-Patient was on Catapres 0.2 mg p.o. twice daily, Toprol-XL 150 mg daily ?-Restarted at low-dose of Catapres 0.1 mg p.o. twice daily  ?-Continue Toprol XL 150 mg p.o. daily ?-Blood pressure is stable ?  ?  ?Chronic diastolic CHF ?-Restarted Lasix 20 mg daily  ?-Oxygen has been weaned off to room air ?  ?Bullous pemphigoid ?-Methotrexate on hold ?  ?Constipation ?-Resolved after patient received milk and molasses enema ? ?  ? ? ?Consultants:  ?Procedures performed:  ?Disposition: Skilled nursing facility ?Diet recommendation:  ?Discharge Diet Orders (From admission, onward)  ? ?  Start     Ordered  ? 08/05/21 0000  Diet - low sodium heart healthy       ? 08/05/21 1119  ? ?  ?  ? ?  ? ?Cardiac diet ?DISCHARGE MEDICATION: ?Allergies as of 08/05/2021   ? ?   Reactions  ? Spironolactone Nausea Only  ? Hydrocodone Nausea And Vomiting  ? ?  ? ?  ?Medication List  ?  ? ?STOP taking these medications   ? ?eplerenone 25 MG tablet ?Commonly known as: INSPRA ?  ? ?  ? ?TAKE these medications   ? ?acetaminophen 325 MG tablet ?Commonly known as: TYLENOL ?Take 650 mg by mouth every 6 (six) hours as needed for moderate pain. ?  ?aspirin EC 81 MG tablet ?Take 81 mg by mouth every morning. Swallow whole. ?  ?atorvastatin 10 MG tablet ?Commonly known as: LIPITOR ?Take 10 mg by mouth daily. ?  ?CENTRUM SILVER 50+MEN  PO ?Take 1 tablet by mouth every morning. ?  ?cloNIDine 0.1 MG tablet ?Commonly known as: CATAPRES ?Take 1 tablet (0.1 mg total) by mouth 2 (two) times daily. ?What changed:  ?medication strength ?how much to take ?  ?diclofenac 75 MG EC tablet ?Commonly known as: VOLTAREN ?Take 75-150 mg by mouth See admin instructions. '150mg'$  oral in the morning ?'75mg'$  oral in the evening ?  ?eucerin cream ?Apply to back and other affected area of skin  twice a day ?What changed:  ?how much to take ?when to take this ?additional instructions ?   ?finasteride 5 MG tablet ?Commonly known as: PROSCAR ?Take 5 mg by mouth daily. ?  ?folic acid 1 MG tablet ?Commonly known as: FOLVITE ?Take 1 mg by mouth every morning. ?  ?furosemide 20 MG tablet ?Commonly known as: LASIX ?Take 1 tablet (20 mg total) by mouth daily. ?Start taking on: August 06, 2021 ?What changed:  ?medication strength ?how much to take ?  ?gabapentin 100 MG capsule ?Commonly known as: NEURONTIN ?Take 100 mg by mouth in the morning and at bedtime. ?  ?LORazepam 0.5 MG tablet ?Commonly known as: ATIVAN ?Take 1 tablet (0.5 mg total) by mouth every 6 (six) hours as needed for anxiety. ?  ?methotrexate 2.5 MG tablet ?Commonly known as: RHEUMATREX ?Take 10 mg by mouth every Friday. Caution:Chemotherapy. Protect from light.  ?Friday ?  ?metoprolol succinate 100 MG 24 hr tablet ?Commonly known as: TOPROL-XL ?Take 150 mg by mouth every morning. ?  ?montelukast 10 MG tablet ?Commonly known as: SINGULAIR ?Take 10 mg by mouth daily. ?  ?omega-3 acid ethyl esters 1 g capsule ?Commonly known as: LOVAZA ?Take 1 g by mouth 2 (two) times daily. ?  ?ondansetron 4 MG tablet ?Commonly known as: ZOFRAN ?Take 4 mg by mouth every 4 (four) hours as needed for nausea or vomiting. ?  ?OXYGEN ?Inhale 1 L into the lungs daily as needed (shortness of breath). ?  ?polyethylene glycol 17 g packet ?Commonly known as: MIRALAX / GLYCOLAX ?Take 17 g by mouth daily as needed for mild constipation. ?  ?Restasis 0.05 % ophthalmic emulsion ?Generic drug: cycloSPORINE ?Place 1 drop into both eyes 2 (two) times daily as needed (dry eyes). ?  ?senna-docusate 8.6-50 MG tablet ?Commonly known as: Senokot-S ?Take 1 tablet by mouth 2 (two) times daily as needed for mild constipation. ?What changed: when to take this ?  ?traMADol 50 MG tablet ?Commonly known as: ULTRAM ?Take 1 tablet (50 mg total) by mouth every 6 (six) hours as needed. ?  ?triamcinolone cream 0.5 % ?Commonly known as: KENALOG ?Apply 1 application. topically 3 (three) times  daily. ?  ?Vitamin D3 125 MCG (5000 UT) Caps ?Take 5,000 Units by mouth daily. ?  ? ?  ? ? Contact information for after-discharge care   ? ? Destination   ? ? [facility] .   ?Service: Skilled Nursing ?Contact information: ?[facility] ?[place] ?[phone] ? ?  ?  ? ?  ?  ? ?  ?  ? ?  ? ?Discharge Exam: ?Filed Weights  ? 08/01/21 0545 08/02/21 0439 08/05/21 0619  ?Weight: 108 kg 111.4 kg 109 kg  ? ?General-appears in no acute distress ?Heart-S1-S2, regular, no murmur auscultated ?Lungs-clear to auscultation bilaterally, no wheezing or crackles auscultated ?Abdomen-soft, nontender, no organomegaly ?Extremities-no edema in the lower extremities ?Neuro-alert, oriented x3, no focal deficit noted ? ?Condition at discharge: good ? ?The results of significant  diagnostics from this hospitalization (including imaging, microbiology, ancillary and laboratory) are listed below for reference.  ? ?Imaging Studies: ?DG Ribs Unilateral W/Chest Right ? ?Result Date: 07/10/2021 ?CLINICAL DATA:  Fall with skin tear EXAM: RIGHT RIBS AND CHEST - 3+ VIEW COMPARISON:  01/11/2017 FINDINGS: Single view chest demonstrates chronic elevation of right diaphragm with subsegmental atelectasis. No acute consolidation, pleural effusion or pneumothorax. Stable cardiomediastinal silhouette. Right rib series demonstrates no acute displaced right rib fracture IMPRESSION: Negative. Electronically Signed   By: Donavan Foil M.D.   On: 07/10/2021 18:54  ? ?DG Tibia/Fibula Right ? ?Result Date: 07/10/2021 ?CLINICAL DATA:  Fall with skin tear EXAM: RIGHT TIBIA AND FIBULA - 2 VIEW COMPARISON:  None. FINDINGS: Right knee replacement. No fracture or malalignment. Edema within the soft tissues. IMPRESSION: Right knee replacement without acute osseous abnormality Electronically Signed   By: Donavan Foil M.D.   On: 07/10/2021 18:51  ? ?CT HEAD WO CONTRAST (5MM) ? ?Result Date: 07/10/2021 ?CLINICAL DATA:   Fall and hit right side with hematoma to eyebrow EXAM: CT HEAD WITHOUT CONTRAST CT MAXILLOFACIAL WITHOUT CONTRAST CT CERVICAL SPINE WITHOUT CONTRAST TECHNIQUE: Multidetector CT imaging of the head, cervical spine, an

## 2021-08-05 NOTE — TOC Transition Note (Signed)
Transition of Care (TOC) - CM/SW Discharge Note ? ? ?Patient Details  ?Name: [name] ?MRN: [id number] ?Date of Birth: 1932-06-26 ? ?Transition of Care (TOC) CM/SW Contact:  ?Trish Mage, LCSW ?Phone Number: ?08/05/2021, 11:40 AM ? ? ?Clinical Narrative:   Patient who is stable for d/c will transfer to [facility] today.  Family informed.  PTAR arranged.  Nursing, please call report to [phone], room 306A. TOC sign off. ? ? ? ?Final next level of care: [place] ?Barriers to Discharge: Barriers Resolved ? ? ?Patient Goals and CMS Choice ?Patient states their goals for this hospitalization and ongoing recovery are:: Return to [facility] ?CMS Medicare.gov Compare Post Acute Care list provided to:: Patient ?Choice offered to / list presented to : Patient ? ?Discharge Placement ?  ?           ?  ?  ?  ?  ? ?Discharge Plan and Services ?  ?Discharge Planning Services: CM Consult ?           ?  ?  ?  ?  ?  ?  ?  ?  ?  ?  ? ?Social Determinants of Health (SDOH) Interventions ?  ? ? ?Readmission Risk Interventions ?No flowsheet data found. ? ? ? ? ?

## 2021-08-05 NOTE — Progress Notes (Signed)
Attempted call x2 to [facility].  This time upon transferring from receptionist "Destiny" the call was forwarded, rang several times and then the line cut off.   ?

## 2021-08-05 NOTE — Progress Notes (Signed)
Attempted to call report to [facility]. Transferred and reached voicemail that states it is full.  Will attempt to call back. ?

## 2021-08-06 DIAGNOSIS — I5032 Chronic diastolic (congestive) heart failure: Secondary | ICD-10-CM | POA: Diagnosis not present

## 2021-08-06 DIAGNOSIS — N390 Urinary tract infection, site not specified: Secondary | ICD-10-CM | POA: Diagnosis not present

## 2021-08-06 DIAGNOSIS — E871 Hypo-osmolality and hyponatremia: Secondary | ICD-10-CM | POA: Diagnosis not present

## 2021-08-06 DIAGNOSIS — D649 Anemia, unspecified: Secondary | ICD-10-CM | POA: Diagnosis not present

## 2021-08-06 DIAGNOSIS — L03115 Cellulitis of right lower limb: Secondary | ICD-10-CM | POA: Diagnosis not present

## 2021-08-07 DIAGNOSIS — D649 Anemia, unspecified: Secondary | ICD-10-CM | POA: Diagnosis not present

## 2021-08-07 DIAGNOSIS — I5032 Chronic diastolic (congestive) heart failure: Secondary | ICD-10-CM | POA: Diagnosis not present

## 2021-08-07 DIAGNOSIS — L03115 Cellulitis of right lower limb: Secondary | ICD-10-CM | POA: Diagnosis not present

## 2021-08-07 DIAGNOSIS — M1991 Primary osteoarthritis, unspecified site: Secondary | ICD-10-CM | POA: Diagnosis not present

## 2021-08-07 DIAGNOSIS — I10 Essential (primary) hypertension: Secondary | ICD-10-CM | POA: Diagnosis not present

## 2021-08-07 DIAGNOSIS — E871 Hypo-osmolality and hyponatremia: Secondary | ICD-10-CM | POA: Diagnosis not present

## 2021-08-07 DIAGNOSIS — N390 Urinary tract infection, site not specified: Secondary | ICD-10-CM | POA: Diagnosis not present

## 2021-08-07 DIAGNOSIS — J45909 Unspecified asthma, uncomplicated: Secondary | ICD-10-CM | POA: Diagnosis not present

## 2021-08-07 DIAGNOSIS — N400 Enlarged prostate without lower urinary tract symptoms: Secondary | ICD-10-CM | POA: Diagnosis not present

## 2021-08-08 DIAGNOSIS — I5032 Chronic diastolic (congestive) heart failure: Secondary | ICD-10-CM | POA: Diagnosis not present

## 2021-08-08 DIAGNOSIS — L03115 Cellulitis of right lower limb: Secondary | ICD-10-CM | POA: Diagnosis not present

## 2021-08-08 DIAGNOSIS — D649 Anemia, unspecified: Secondary | ICD-10-CM | POA: Diagnosis not present

## 2021-08-08 DIAGNOSIS — E871 Hypo-osmolality and hyponatremia: Secondary | ICD-10-CM | POA: Diagnosis not present

## 2021-08-08 DIAGNOSIS — N390 Urinary tract infection, site not specified: Secondary | ICD-10-CM | POA: Diagnosis not present

## 2021-08-11 DIAGNOSIS — I5032 Chronic diastolic (congestive) heart failure: Secondary | ICD-10-CM | POA: Diagnosis not present

## 2021-08-11 DIAGNOSIS — E871 Hypo-osmolality and hyponatremia: Secondary | ICD-10-CM | POA: Diagnosis not present

## 2021-08-11 DIAGNOSIS — N390 Urinary tract infection, site not specified: Secondary | ICD-10-CM | POA: Diagnosis not present

## 2021-08-11 DIAGNOSIS — L03115 Cellulitis of right lower limb: Secondary | ICD-10-CM | POA: Diagnosis not present

## 2021-08-11 DIAGNOSIS — D649 Anemia, unspecified: Secondary | ICD-10-CM | POA: Diagnosis not present

## 2021-08-12 DIAGNOSIS — S81801A Unspecified open wound, right lower leg, initial encounter: Secondary | ICD-10-CM | POA: Diagnosis not present

## 2021-08-13 DIAGNOSIS — I5032 Chronic diastolic (congestive) heart failure: Secondary | ICD-10-CM | POA: Diagnosis not present

## 2021-08-13 DIAGNOSIS — D649 Anemia, unspecified: Secondary | ICD-10-CM | POA: Diagnosis not present

## 2021-08-13 DIAGNOSIS — N390 Urinary tract infection, site not specified: Secondary | ICD-10-CM | POA: Diagnosis not present

## 2021-08-13 DIAGNOSIS — L03115 Cellulitis of right lower limb: Secondary | ICD-10-CM | POA: Diagnosis not present

## 2021-08-13 DIAGNOSIS — E871 Hypo-osmolality and hyponatremia: Secondary | ICD-10-CM | POA: Diagnosis not present

## 2021-08-15 DIAGNOSIS — E871 Hypo-osmolality and hyponatremia: Secondary | ICD-10-CM | POA: Diagnosis not present

## 2021-08-15 DIAGNOSIS — D649 Anemia, unspecified: Secondary | ICD-10-CM | POA: Diagnosis not present

## 2021-08-15 DIAGNOSIS — I5032 Chronic diastolic (congestive) heart failure: Secondary | ICD-10-CM | POA: Diagnosis not present

## 2021-08-15 DIAGNOSIS — N390 Urinary tract infection, site not specified: Secondary | ICD-10-CM | POA: Diagnosis not present

## 2021-08-15 DIAGNOSIS — L03115 Cellulitis of right lower limb: Secondary | ICD-10-CM | POA: Diagnosis not present

## 2021-08-18 DIAGNOSIS — R296 Repeated falls: Secondary | ICD-10-CM | POA: Diagnosis not present

## 2021-08-19 DIAGNOSIS — I5032 Chronic diastolic (congestive) heart failure: Secondary | ICD-10-CM | POA: Diagnosis not present

## 2021-08-19 DIAGNOSIS — E871 Hypo-osmolality and hyponatremia: Secondary | ICD-10-CM | POA: Diagnosis not present

## 2021-08-19 DIAGNOSIS — I890 Lymphedema, not elsewhere classified: Secondary | ICD-10-CM | POA: Diagnosis not present

## 2021-08-19 DIAGNOSIS — S41102A Unspecified open wound of left upper arm, initial encounter: Secondary | ICD-10-CM | POA: Diagnosis not present

## 2021-08-19 DIAGNOSIS — L03115 Cellulitis of right lower limb: Secondary | ICD-10-CM | POA: Diagnosis not present

## 2021-08-19 DIAGNOSIS — N390 Urinary tract infection, site not specified: Secondary | ICD-10-CM | POA: Diagnosis not present

## 2021-08-19 DIAGNOSIS — D649 Anemia, unspecified: Secondary | ICD-10-CM | POA: Diagnosis not present

## 2021-08-19 DIAGNOSIS — S81801A Unspecified open wound, right lower leg, initial encounter: Secondary | ICD-10-CM | POA: Diagnosis not present

## 2021-08-22 DIAGNOSIS — L03115 Cellulitis of right lower limb: Secondary | ICD-10-CM | POA: Diagnosis not present

## 2021-08-22 DIAGNOSIS — D649 Anemia, unspecified: Secondary | ICD-10-CM | POA: Diagnosis not present

## 2021-08-22 DIAGNOSIS — E871 Hypo-osmolality and hyponatremia: Secondary | ICD-10-CM | POA: Diagnosis not present

## 2021-08-22 DIAGNOSIS — N390 Urinary tract infection, site not specified: Secondary | ICD-10-CM | POA: Diagnosis not present

## 2021-08-22 DIAGNOSIS — I5032 Chronic diastolic (congestive) heart failure: Secondary | ICD-10-CM | POA: Diagnosis not present

## 2021-08-28 DIAGNOSIS — F419 Anxiety disorder, unspecified: Secondary | ICD-10-CM | POA: Diagnosis not present

## 2021-08-28 DIAGNOSIS — D649 Anemia, unspecified: Secondary | ICD-10-CM | POA: Diagnosis not present

## 2021-08-28 DIAGNOSIS — S81812A Laceration without foreign body, left lower leg, initial encounter: Secondary | ICD-10-CM | POA: Diagnosis not present

## 2021-08-28 DIAGNOSIS — N179 Acute kidney failure, unspecified: Secondary | ICD-10-CM | POA: Diagnosis not present

## 2021-08-28 DIAGNOSIS — I10 Essential (primary) hypertension: Secondary | ICD-10-CM | POA: Diagnosis not present

## 2021-08-28 DIAGNOSIS — I5032 Chronic diastolic (congestive) heart failure: Secondary | ICD-10-CM | POA: Diagnosis not present

## 2021-09-01 ENCOUNTER — Inpatient Hospital Stay (HOSPITAL_COMMUNITY)
Admission: EM | Admit: 2021-09-01 | Discharge: 2021-09-08 | DRG: 602 | Disposition: A | Discharge status: Skilled Nursing Facility | Payer: Medicare PPO | Attending: Internal Medicine | Admitting: Internal Medicine

## 2021-09-01 ENCOUNTER — Emergency Department (HOSPITAL_COMMUNITY): Payer: Medicare PPO

## 2021-09-01 ENCOUNTER — Other Ambulatory Visit: Payer: Self-pay

## 2021-09-01 ENCOUNTER — Encounter (HOSPITAL_COMMUNITY): Payer: Self-pay

## 2021-09-01 DIAGNOSIS — M7989 Other specified soft tissue disorders: Secondary | ICD-10-CM | POA: Diagnosis not present

## 2021-09-01 DIAGNOSIS — F419 Anxiety disorder, unspecified: Secondary | ICD-10-CM | POA: Diagnosis present

## 2021-09-01 DIAGNOSIS — J9601 Acute respiratory failure with hypoxia: Secondary | ICD-10-CM | POA: Diagnosis not present

## 2021-09-01 DIAGNOSIS — Z20822 Contact with and (suspected) exposure to covid-19: Secondary | ICD-10-CM | POA: Diagnosis present

## 2021-09-01 DIAGNOSIS — L03116 Cellulitis of left lower limb: Secondary | ICD-10-CM | POA: Diagnosis present

## 2021-09-01 DIAGNOSIS — I96 Gangrene, not elsewhere classified: Secondary | ICD-10-CM | POA: Diagnosis present

## 2021-09-01 DIAGNOSIS — Z79899 Other long term (current) drug therapy: Secondary | ICD-10-CM

## 2021-09-01 DIAGNOSIS — L03119 Cellulitis of unspecified part of limb: Secondary | ICD-10-CM | POA: Diagnosis not present

## 2021-09-01 DIAGNOSIS — I878 Other specified disorders of veins: Secondary | ICD-10-CM | POA: Diagnosis present

## 2021-09-01 DIAGNOSIS — S301XXA Contusion of abdominal wall, initial encounter: Secondary | ICD-10-CM | POA: Diagnosis present

## 2021-09-01 DIAGNOSIS — L03115 Cellulitis of right lower limb: Secondary | ICD-10-CM | POA: Diagnosis present

## 2021-09-01 DIAGNOSIS — L0390 Cellulitis, unspecified: Secondary | ICD-10-CM | POA: Diagnosis present

## 2021-09-01 DIAGNOSIS — R2689 Other abnormalities of gait and mobility: Secondary | ICD-10-CM | POA: Diagnosis not present

## 2021-09-01 DIAGNOSIS — Z888 Allergy status to other drugs, medicaments and biological substances status: Secondary | ICD-10-CM

## 2021-09-01 DIAGNOSIS — Z885 Allergy status to narcotic agent status: Secondary | ICD-10-CM | POA: Diagnosis not present

## 2021-09-01 DIAGNOSIS — L120 Bullous pemphigoid: Secondary | ICD-10-CM | POA: Diagnosis present

## 2021-09-01 DIAGNOSIS — Z96653 Presence of artificial knee joint, bilateral: Secondary | ICD-10-CM | POA: Diagnosis present

## 2021-09-01 DIAGNOSIS — W010XXA Fall on same level from slipping, tripping and stumbling without subsequent striking against object, initial encounter: Secondary | ICD-10-CM | POA: Diagnosis present

## 2021-09-01 DIAGNOSIS — I10 Essential (primary) hypertension: Secondary | ICD-10-CM | POA: Diagnosis not present

## 2021-09-01 DIAGNOSIS — Z6836 Body mass index (BMI) 36.0-36.9, adult: Secondary | ICD-10-CM | POA: Diagnosis not present

## 2021-09-01 DIAGNOSIS — Z043 Encounter for examination and observation following other accident: Secondary | ICD-10-CM | POA: Diagnosis not present

## 2021-09-01 DIAGNOSIS — E669 Obesity, unspecified: Secondary | ICD-10-CM | POA: Diagnosis present

## 2021-09-01 DIAGNOSIS — Z66 Do not resuscitate: Secondary | ICD-10-CM | POA: Diagnosis present

## 2021-09-01 DIAGNOSIS — D539 Nutritional anemia, unspecified: Secondary | ICD-10-CM | POA: Diagnosis present

## 2021-09-01 DIAGNOSIS — N179 Acute kidney failure, unspecified: Secondary | ICD-10-CM | POA: Diagnosis not present

## 2021-09-01 DIAGNOSIS — S81812A Laceration without foreign body, left lower leg, initial encounter: Secondary | ICD-10-CM | POA: Diagnosis present

## 2021-09-01 DIAGNOSIS — Z9981 Dependence on supplemental oxygen: Secondary | ICD-10-CM

## 2021-09-01 DIAGNOSIS — Y92019 Unspecified place in single-family (private) house as the place of occurrence of the external cause: Secondary | ICD-10-CM

## 2021-09-01 DIAGNOSIS — Z87891 Personal history of nicotine dependence: Secondary | ICD-10-CM

## 2021-09-01 DIAGNOSIS — R410 Disorientation, unspecified: Secondary | ICD-10-CM | POA: Diagnosis not present

## 2021-09-01 DIAGNOSIS — E871 Hypo-osmolality and hyponatremia: Secondary | ICD-10-CM | POA: Diagnosis present

## 2021-09-01 DIAGNOSIS — N400 Enlarged prostate without lower urinary tract symptoms: Secondary | ICD-10-CM | POA: Diagnosis present

## 2021-09-01 DIAGNOSIS — Z96612 Presence of left artificial shoulder joint: Secondary | ICD-10-CM | POA: Diagnosis present

## 2021-09-01 DIAGNOSIS — G629 Polyneuropathy, unspecified: Secondary | ICD-10-CM | POA: Diagnosis present

## 2021-09-01 DIAGNOSIS — J45909 Unspecified asthma, uncomplicated: Secondary | ICD-10-CM | POA: Diagnosis present

## 2021-09-01 DIAGNOSIS — I110 Hypertensive heart disease with heart failure: Secondary | ICD-10-CM | POA: Diagnosis present

## 2021-09-01 DIAGNOSIS — Z993 Dependence on wheelchair: Secondary | ICD-10-CM | POA: Diagnosis not present

## 2021-09-01 DIAGNOSIS — I5033 Acute on chronic diastolic (congestive) heart failure: Secondary | ICD-10-CM | POA: Diagnosis present

## 2021-09-01 DIAGNOSIS — W19XXXA Unspecified fall, initial encounter: Secondary | ICD-10-CM | POA: Diagnosis not present

## 2021-09-01 DIAGNOSIS — E876 Hypokalemia: Secondary | ICD-10-CM | POA: Diagnosis not present

## 2021-09-01 DIAGNOSIS — M6281 Muscle weakness (generalized): Secondary | ICD-10-CM | POA: Diagnosis not present

## 2021-09-01 DIAGNOSIS — R278 Other lack of coordination: Secondary | ICD-10-CM | POA: Diagnosis not present

## 2021-09-01 DIAGNOSIS — M47816 Spondylosis without myelopathy or radiculopathy, lumbar region: Secondary | ICD-10-CM | POA: Diagnosis present

## 2021-09-01 DIAGNOSIS — R609 Edema, unspecified: Secondary | ICD-10-CM | POA: Diagnosis not present

## 2021-09-01 DIAGNOSIS — Z7401 Bed confinement status: Secondary | ICD-10-CM | POA: Diagnosis not present

## 2021-09-01 DIAGNOSIS — R0902 Hypoxemia: Secondary | ICD-10-CM | POA: Diagnosis not present

## 2021-09-01 DIAGNOSIS — S81802A Unspecified open wound, left lower leg, initial encounter: Secondary | ICD-10-CM | POA: Diagnosis not present

## 2021-09-01 DIAGNOSIS — E785 Hyperlipidemia, unspecified: Secondary | ICD-10-CM | POA: Diagnosis present

## 2021-09-01 DIAGNOSIS — Z8249 Family history of ischemic heart disease and other diseases of the circulatory system: Secondary | ICD-10-CM

## 2021-09-01 DIAGNOSIS — Z96641 Presence of right artificial hip joint: Secondary | ICD-10-CM | POA: Diagnosis present

## 2021-09-01 DIAGNOSIS — R062 Wheezing: Secondary | ICD-10-CM | POA: Diagnosis not present

## 2021-09-01 DIAGNOSIS — R531 Weakness: Secondary | ICD-10-CM | POA: Diagnosis not present

## 2021-09-01 DIAGNOSIS — J439 Emphysema, unspecified: Secondary | ICD-10-CM | POA: Diagnosis not present

## 2021-09-01 LAB — BASIC METABOLIC PANEL
Anion gap: 8 (ref 5–15)
BUN: 25 mg/dL — ABNORMAL HIGH (ref 8–23)
CO2: 26 mmol/L (ref 22–32)
Calcium: 8.5 mg/dL — ABNORMAL LOW (ref 8.9–10.3)
Chloride: 94 mmol/L — ABNORMAL LOW (ref 98–111)
Creatinine, Ser: 1.21 mg/dL (ref 0.61–1.24)
GFR, Estimated: 58 mL/min — ABNORMAL LOW (ref >60)
Glucose, Bld: 102 mg/dL — ABNORMAL HIGH (ref 70–99)
Potassium: 3.5 mmol/L (ref 3.5–5.1)
Sodium: 128 mmol/L — ABNORMAL LOW (ref 135–145)

## 2021-09-01 LAB — CBC WITH DIFFERENTIAL/PLATELET
Abs Immature Granulocytes: 0.11 10*3/uL — ABNORMAL HIGH (ref 0.00–0.07)
Basophils Absolute: 0 10*3/uL (ref 0.0–0.1)
Basophils Relative: 0 %
Eosinophils Absolute: 0 10*3/uL (ref 0.0–0.5)
Eosinophils Relative: 0 %
HCT: 27.1 % — ABNORMAL LOW (ref 39.0–52.0)
Hemoglobin: 8.9 g/dL — ABNORMAL LOW (ref 13.0–17.0)
Immature Granulocytes: 1 %
Lymphocytes Relative: 7 %
Lymphs Abs: 1.2 10*3/uL (ref 0.7–4.0)
MCH: 34.4 pg — ABNORMAL HIGH (ref 26.0–34.0)
MCHC: 32.8 g/dL (ref 30.0–36.0)
MCV: 104.6 fL — ABNORMAL HIGH (ref 80.0–100.0)
Monocytes Absolute: 1 10*3/uL (ref 0.1–1.0)
Monocytes Relative: 7 %
Neutro Abs: 13.4 10*3/uL — ABNORMAL HIGH (ref 1.7–7.7)
Neutrophils Relative %: 85 %
Platelets: 155 10*3/uL (ref 150–400)
RBC: 2.59 MIL/uL — ABNORMAL LOW (ref 4.22–5.81)
RDW: 15.1 % (ref 11.5–15.5)
WBC: 15.8 10*3/uL — ABNORMAL HIGH (ref 4.0–10.5)
nRBC: 0 % (ref 0.0–0.2)

## 2021-09-01 LAB — BRAIN NATRIURETIC PEPTIDE: B Natriuretic Peptide: 342.3 pg/mL — ABNORMAL HIGH (ref 0.0–100.0)

## 2021-09-01 LAB — URINALYSIS, DIPSTICK ONLY
Bilirubin Urine: NEGATIVE
Glucose, UA: NEGATIVE mg/dL
Hgb urine dipstick: NEGATIVE
Ketones, ur: NEGATIVE mg/dL
Nitrite: NEGATIVE
Protein, ur: NEGATIVE mg/dL
Specific Gravity, Urine: 1.013 (ref 1.005–1.030)
pH: 5 (ref 5.0–8.0)

## 2021-09-01 LAB — RESP PANEL BY RT-PCR (FLU A&B, COVID) ARPGX2
Influenza A by PCR: NEGATIVE
Influenza B by PCR: NEGATIVE
SARS Coronavirus 2 by RT PCR: NEGATIVE

## 2021-09-01 LAB — TROPONIN I (HIGH SENSITIVITY)
Troponin I (High Sensitivity): 5 ng/L (ref <18)
Troponin I (High Sensitivity): 5 ng/L (ref <18)

## 2021-09-01 MED ORDER — GABAPENTIN 300 MG PO CAPS
300.0000 mg | ORAL_CAPSULE | Freq: Two times a day (BID) | ORAL | Status: DC
  Administered 2021-09-01 – 2021-09-08 (×14): 300 mg via ORAL
  Filled 2021-09-01 (×14): qty 1

## 2021-09-01 MED ORDER — TRAMADOL HCL 50 MG PO TABS: 50.0000 mg | ORAL_TABLET | Freq: Four times a day (QID) | ORAL | Status: DC | PRN

## 2021-09-01 MED ORDER — FAMOTIDINE 20 MG PO TABS
20.0000 mg | ORAL_TABLET | Freq: Two times a day (BID) | ORAL | Status: DC
  Administered 2021-09-01: 20 mg via ORAL
  Filled 2021-09-01: qty 1

## 2021-09-01 MED ORDER — MELATONIN 3 MG PO TABS
3.0000 mg | ORAL_TABLET | Freq: Every evening | ORAL | Status: DC | PRN
  Administered 2021-09-05 – 2021-09-07 (×2): 3 mg via ORAL
  Filled 2021-09-01 (×2): qty 1

## 2021-09-01 MED ORDER — VANCOMYCIN HCL 1250 MG/250ML IV SOLN: 1250.0000 mg | Freq: Once | INTRAVENOUS | Status: DC

## 2021-09-01 MED ORDER — VANCOMYCIN HCL 2000 MG/400ML IV SOLN
2000.0000 mg | Freq: Once | INTRAVENOUS | Status: AC
  Administered 2021-09-01: 2000 mg via INTRAVENOUS
  Filled 2021-09-01: qty 400

## 2021-09-01 NOTE — ED Notes (Signed)
Verbal report received from [place] at this time ?

## 2021-09-01 NOTE — H&P (Addendum)
History and Physical    [name] NWG:[id number] DOB: 1932/09/16 DOA: 09/01/2021  PCP: [facility]  Patient coming from: Home  Chief Complaint: Generalized weakness  HPI: [name] is a 88 y.o. male with medical history significant of chronic diastolic CHF, hypertension, BPH, anxiety, bullous pemphigoid, asthma, hyperlipidemia, chronic hyponatremia.  Admitted last month for hypotension, AKI, right lower extremity cellulitis, and UTI.  He presents to the ED today for evaluation of generalized weakness and hypoxia.  Placed on 2 L supplemental oxygen.  WBC 15.8, hemoglobin 8.9 (baseline 11-12), MCV 104.6, platelet count 155k.  Sodium 128 (baseline low 130s), potassium 3.5, chloride 94, bicarb 26, BUN 25, creatinine 1.2, glucose 102.  Urine dipstick showing negative nitrite, trace leukocytes.  BNP 342.  High-sensitivity troponin negative x2.  COVID and influenza PCR negative.  Chest x-ray showing no active cardiopulmonary disease.  Patient noted to have chronic left lower extremity wound with signs of cellulitis.  He was given vancomycin.  Patient reports pain in his left anterior lower ribs since after a fall 10 days ago.  Reports bruising in this area.  States he tripped over something and fell but did not hit his head or lose consciousness.  He does not take any blood thinners.  After this fall, he had another incident where he hit his left lower leg against an object and had a skin tear.  States his PCP placed him on doxycycline for leg infection.  He has a wound care nurse who visits him 3 times a week and also has a physical therapist who visits him twice a week.  Reports generalized weakness and now has to use a wheelchair to ambulate.  Also reports cough and shortness of breath for several days.  He does not use oxygen at home.  Denies fevers, chest pain, nausea, vomiting, abdominal pain, diarrhea, or constipation.  Review of Systems:  Review of Systems   All other systems reviewed and are negative.  Past Medical History:  Diagnosis Date   Anxiety    "pt. denies" 10-11-12   Arthritis    degenerative arthritis    Arthritis, lumbar spine    BPH (benign prostatic hyperplasia)    Dysrhythmia    1st degree heart block  palpitations 04/07/2011, seen & complete eval. /w Dr. Mayford Knife- stress, echo, holter monitor, has been cleared for surg.    Edema of both legs    improved with use of meds/ compression socks used.   Heart palpitations 2012   Hypertension    Pemphigoid    PONV (postoperative nausea and vomiting)    PVC's (premature ventricular contractions)    Spinal stenosis     Past Surgical History:  Procedure Laterality Date   BACK SURGERY  01/2011   02/2011   JOINT REPLACEMENT     R knee replacement-  10/06, L shoulder - 3/07, R hip- replacement- '07   TOTAL HIP ARTHROPLASTY  2007   right total hip replacement   TOTAL HIP ARTHROPLASTY Left 10/14/2012   Procedure: LEFT TOTAL HIP ARTHROPLASTY ANTERIOR APPROACH;  Surgeon: Kathryne Hitch, MD;  Location: [facility];  Service: Orthopedics;  Laterality: Left;   TOTAL KNEE ARTHROPLASTY  2007   right total knee   TOTAL KNEE ARTHROPLASTY  06/01/2011   Procedure: TOTAL KNEE ARTHROPLASTY;  Surgeon: Nilda Simmer, MD;  Location: [facility] OR;  Service: Orthopedics;  Laterality: Left;   TOTAL SHOULDER ARTHROPLASTY Left 2007     reports that he quit smoking  about 37 years ago. He has never used smokeless tobacco. He reports that he does not drink alcohol and does not use drugs.  Allergies  Allergen Reactions   Spironolactone Nausea Only   Hydrocodone Nausea And Vomiting    Family History  Problem Relation Age of Onset   Heart attack Mother    Hypertension Mother    Suicidality Father    Heart disease Sister    Anesthesia problems Neg Hx    Hypotension Neg Hx    Malignant hyperthermia Neg Hx    Pseudochol deficiency Neg Hx    Colon cancer Neg Hx    Esophageal cancer Neg Hx    Rectal  cancer Neg Hx    Stomach cancer Neg Hx     Prior to Admission medications   Medication Sig Start Date End Date Taking? Authorizing Provider  Cholecalciferol (VITAMIN D3) 125 MCG (5000 UT) CAPS Take 5,000 Units by mouth daily.   Yes [provider]  cloNIDine (CATAPRES) 0.2 MG tablet Take 0.2 mg by mouth 2 (two) times daily.   Yes [provider]  diclofenac (VOLTAREN) 75 MG EC tablet Take 75 mg by mouth 2 (two) times daily.   Yes [provider]  eplerenone (INSPRA) 25 MG tablet Take 25 mg by mouth daily.   Yes [provider]  finasteride (PROSCAR) 5 MG tablet Take 5 mg by mouth daily.   Yes [provider]  folic acid (FOLVITE) 1 MG tablet Take 1 mg by mouth every morning.   Yes [provider]  furosemide (LASIX) 40 MG tablet Take 40 mg by mouth 2 (two) times daily.   Yes [provider]  gabapentin (NEURONTIN) 300 MG capsule Take 300 mg by mouth 2 (two) times daily.   Yes [provider]  LORazepam (ATIVAN) 0.5 MG tablet Take 1 tablet (0.5 mg total) by mouth every 6 (six) hours as needed for anxiety. 08/05/21  Yes Meredeth Ide, MD  methotrexate (RHEUMATREX) 2.5 MG tablet Take 7.5 mg by mouth every Friday. Caution:Chemotherapy. Protect from light.  Friday   Yes [provider]  metoprolol (TOPROL-XL) 100 MG 24 hr tablet Take 150 mg by mouth daily.   Yes [provider]  montelukast (SINGULAIR) 10 MG tablet Take 10 mg by mouth daily. 05/18/18  Yes [provider]  Multiple Vitamins-Minerals (CENTRUM SILVER 50+MEN PO) Take 1 tablet by mouth daily.   Yes [provider]  omega-3 acid ethyl esters (LOVAZA) 1 g capsule Take 1 g by mouth 2 (two) times daily.   Yes [provider]  OXYGEN Inhale 1 L into the lungs daily as needed (shortness of breath).   Yes [provider]  polyethylene glycol (MIRALAX / GLYCOLAX) packet Take 17 g by mouth daily as needed for mild  constipation.   Yes [provider]  traMADol (ULTRAM) 50 MG tablet Take 1 tablet (50 mg total) by mouth every 6 (six) hours as needed. Patient taking differently: Take 50 mg by mouth every 6 (six) hours as needed for moderate pain. 08/05/21  Yes Meredeth Ide, MD  cloNIDine (CATAPRES) 0.1 MG tablet Take 1 tablet (0.1 mg total) by mouth 2 (two) times daily. Patient not taking: Reported on 09/01/2021 08/05/21   Meredeth Ide, MD  furosemide (LASIX) 20 MG tablet Take 1 tablet (20 mg total) by mouth daily. Patient not taking: Reported on 09/01/2021 08/06/21   Meredeth Ide, MD  senna-docusate (SENOKOT-S) 8.6-50 MG tablet Take 1 tablet  by mouth 2 (two) times daily as needed for mild constipation. Patient not taking: Reported on 09/01/2021 07/16/21   Almon Hercules, MD  Skin Protectants, Misc. (EUCERIN) cream Apply to back and other affected area of skin  twice a day Patient not taking: Reported on 09/01/2021 07/16/21   Almon Hercules, MD    Physical Exam: Vitals:   09/01/21 1930 09/01/21 2000 09/01/21 2030 09/01/21 2100  BP: (!) 129/55 (!) 98/56 109/74 (!) 153/54  Pulse: 71 72 71 77  Resp: (!) 21 20 20  (!) 22  Temp:      TempSrc:      SpO2: 100% 100% 99% 100%  Weight:      Height:        Physical Exam Vitals reviewed.  Constitutional:      General: He is not in acute distress. HENT:     Head: Normocephalic and atraumatic.  Eyes:     Extraocular Movements: Extraocular movements intact.     Conjunctiva/sclera: Conjunctivae normal.  Cardiovascular:     Rate and Rhythm: Normal rate and regular rhythm.     Pulses: Normal pulses.  Pulmonary:     Effort: Pulmonary effort is normal. No respiratory distress.     Breath sounds: Normal breath sounds. No wheezing or rales.  Abdominal:     General: Bowel sounds are normal. There is no distension.     Palpations: Abdomen is soft.     Tenderness: There is no abdominal tenderness.  Musculoskeletal:     Cervical back: Normal range of motion.      Right lower leg: Edema present.     Left lower leg: Edema present.     Comments: 4+ pitting edema of bilateral lower extremities  Skin:    General: Skin is warm and dry.     Findings: Erythema present.     Comments: Bilateral lower extremities erythematous and warm to touch, L >R Anterior left lower leg superficial skin tear with old clotted blood Extensive area of bruising noted in the upper abdomen on the left  Neurological:     General: No focal deficit present.     Mental Status: He is alert and oriented to person, place, and time.       Labs on Admission: I have personally reviewed following labs and imaging studies  CBC: Recent Labs  Lab 09/01/21 1905  WBC 15.8*  NEUTROABS 13.4*  HGB 8.9*  HCT 27.1*  MCV 104.6*  PLT 155   Basic Metabolic Panel: Recent Labs  Lab 09/01/21 1905  NA 128*  K 3.5  CL 94*  CO2 26  GLUCOSE 102*  BUN 25*  CREATININE 1.21  CALCIUM 8.5*   GFR: Estimated Creatinine Clearance: 55.7 mL/min (by C-G formula based on SCr of 1.21 mg/dL). Liver Function Tests: No results for input(s): AST, ALT, ALKPHOS, BILITOT, PROT, ALBUMIN in the last 168 hours. No results for input(s): LIPASE, AMYLASE in the last 168 hours. No results for input(s): AMMONIA in the last 168 hours. Coagulation Profile: No results for input(s): INR, PROTIME in the last 168 hours. Cardiac Enzymes: No results for input(s): CKTOTAL, CKMB, CKMBINDEX, TROPONINI in the last 168 hours. BNP (last 3 results) No results for input(s): PROBNP in the last 8760 hours. HbA1C: No results for input(s): HGBA1C in the last 72 hours. CBG: No results for input(s): GLUCAP in the last 168 hours. Lipid Profile: No results for input(s): CHOL, HDL, LDLCALC, TRIG, CHOLHDL, LDLDIRECT in the last 72 hours. Thyroid Function Tests: No  results for input(s): TSH, T4TOTAL, FREET4, T3FREE, THYROIDAB in the last 72 hours. Anemia Panel: No results for input(s): VITAMINB12, FOLATE, FERRITIN, TIBC, IRON,  RETICCTPCT in the last 72 hours. Urine analysis:    Component Value Date/Time   COLORURINE YELLOW 09/01/2021 1829   APPEARANCEUR HAZY (A) 09/01/2021 1829   LABSPEC 1.013 09/01/2021 1829   PHURINE 5.0 09/01/2021 1829   GLUCOSEU NEGATIVE 09/01/2021 1829   HGBUR NEGATIVE 09/01/2021 1829   BILIRUBINUR NEGATIVE 09/01/2021 1829   KETONESUR NEGATIVE 09/01/2021 1829   PROTEINUR NEGATIVE 09/01/2021 1829   UROBILINOGEN 0.2 10/11/2012 1105   NITRITE NEGATIVE 09/01/2021 1829   LEUKOCYTESUR TRACE (A) 09/01/2021 1829    Radiological Exams on Admission: I have personally reviewed images DG Chest 2 View  Result Date: 09/01/2021 CLINICAL DATA:  New hypoxia. EXAM: CHEST - 2 VIEW COMPARISON:  Chest x-ray 08/02/2021 FINDINGS: There is stable mild elevation of the right hemidiaphragm. There is no focal lung infiltrate, pleural effusion or pneumothorax. Cardiomediastinal silhouette is within normal limits and stable. Left shoulder arthroplasty is again seen. IMPRESSION: No active cardiopulmonary disease. Electronically Signed   By: Darliss Cheney M.D.   On: 09/01/2021 19:35    Assessment and Plan  Cellulitis of lower legs Admitted last month for right lower extremity cellulitis and treated with antibiotics.  Suffered from a superficial skin tear of the anterior left lower leg several days ago and now has signs of cellulitis.  Both lower extremities are erythematous and warm to touch, left lower extremity appears worse.  WBC 15.8.  Does not meet any other SIRS criteria at this time. -Continue vancomycin -Monitor WBC count -Wound care consult  Acute on chronic diastolic CHF BNP 409. Has 4+ pitting edema of bilateral lower extremities but no pulmonary edema on imaging.  He is on Lasix 40 mg twice daily. Echo done in February 2023 showing normal systolic function and grade 2 diastolic dysfunction. -Renal function stable, IV Lasix 40 mg x 1 now, then twice daily starting in the morning. -Monitor renal  function -Intake and output -Daily weights -Low-sodium diet with fluid restriction  Bilateral lower extremity edema Likely due to decompensated CHF and chronic venous stasis. -Dopplers ordered to rule out DVT  Addendum 4/18 at 12:23 AM: Checking D-dimer level first, if elevated, then order Dopplers to rule out DVT.  Hypoxia Per RN, sats were in the 70s to 80s but unclear whether pulse ox was showing good waveform at that time.  Currently stable on 2 L supplemental oxygen with no respiratory distress.  Lungs clear on exam.  Patient does endorse cough and shortness of breath.  However, chest x-ray showing no acute finding.  COVID and influenza PCR negative.  PE less likely as he is not tachycardic and not endorsing any pleuritic chest pain.  ACS less likely given high-sensitivity troponin negative x2. -Continuous pulse ox -Supplemental oxygen as needed to keep oxygen saturation above 92% -Check D-dimer level, if elevated, CTA to rule out PE.  Rib pain Abdominal bruising Patient is endorsing pain in his anterior left lower ribs since after a fall several days ago.  No rib fractures reported on chest x-ray.  He has extensive bruising on his upper abdomen on the left. -Continue home tramadol as needed for pain, gabapentin as needed -Dedicated x-ray of ribs ordered -Avoid anticoagulation for DVT prophylaxis at this time  Abnormal urinalysis Treated for UTI last month.  Urine dipstick showing negative nitrite, trace leukocytes.  He is not endorsing any urinary symptoms. -Urine microscopy  Generalized weakness -PT/OT eval -Fall precautions  Chronic macrocytic anemia Hemoglobin 8.9, baseline 11-12.  He is not endorsing any symptoms of GI bleed. -Continue home folic acid supplement -Check B12 and folate levels -Continue to monitor hemoglobin  Acute on chronic hyponatremia Likely due to hypervolemia.  Sodium 128, baseline in the low 130s. -Lasix for diuresis as above -Continue to  monitor sodium level  Hypertension Stable. -Continue home clonidine and metoprolol  BPH -Continue finasteride  Anxiety -Continue home Ativan as needed  Bullous pemphigoid -Hold methotrexate given concern for active infection  Asthma Stable, no wheezing. -Continue Singulair -Albuterol as needed  DVT prophylaxis: Avoiding anticoagulation for DVT prophylaxis at this time given extensive bruising on his abdomen.  Avoiding SCDs at this time until Dopplers are done to rule out DVT. Code Status: DNR (discussed with the patient) Family Communication: No family available at this time. Level of care: Med-Surg Admission status: It is my clinical opinion that admission to INPATIENT is reasonable and necessary because of the expectation that this patient will require hospital care that crosses at least 2 midnights to treat this condition based on the medical complexity of the problems presented.  Given the aforementioned information, the predictability of an adverse outcome is felt to be significant.   John Giovanni MD [facility]  If 7PM-7AM, please contact night-coverage [email or web address]  09/01/2021, 10:39 PM

## 2021-09-01 NOTE — ED Notes (Signed)
Patient transported to X-ray 

## 2021-09-01 NOTE — Progress Notes (Signed)
Pharmacy Antibiotic Note ? ?[name] is a 88 y.o. male for which pharmacy has been consulted for vancomycin dosing for cellulitis. Patient presenting with fall. ? ?SCr 1.21 - near baseline ?WBC 15.8; T 98.2 F; HR 77; RR 22 ? ?Plan: ?Vancomycin 2000 mg once then 1250 mg q24hr (eAUC 517) unless change in renal function ?Trend WBC, Fever, Renal function, & Clinical course ?F/u cultures, clinical course, WBC, fever ?De-escalate when able ?Levels at steady state ? ?Height: '5\' 10"'$  (177.8 cm) ?Weight: 115.2 kg (254 lb) ?IBW/kg (Calculated) : 73 ? ?Temp (24hrs), Avg:98.2 ?F (36.8 ?C), Min:98.2 ?F (36.8 ?C), Max:98.2 ?F (36.8 ?C) ? ?Recent Labs  ?Lab 09/01/21 ?1905  ?WBC 15.8*  ?CREATININE 1.21  ?  ?Estimated Creatinine Clearance: 55.7 mL/min (by C-G formula based on SCr of 1.21 mg/dL).   ? ?Allergies  ?Allergen Reactions  ? Spironolactone Nausea Only  ? Hydrocodone Nausea And Vomiting  ? ? ?Antimicrobials this admission: ?vancomycin 4/17 >>  ? ?Microbiology results: ?Pending ? ?Thank you for allowing pharmacy to be a part of this patientRoys care. ? ?Lorelei Pont, PharmD, BCPS ?09/01/2021 9:03 PM ?ED Clinical Pharmacist -  [phone] ?  ?

## 2021-09-01 NOTE — ED Notes (Signed)
Admit provider at bedside 

## 2021-09-01 NOTE — ED Notes (Signed)
Pt moved to room at this time 

## 2021-09-01 NOTE — ED Provider Notes (Signed)
?[facility] ?Provider Note ? ? ?CSN: [id number] ?Arrival date & time: 09/01/21  1758 ? ?  ? ?History ? ?Chief Complaint  ?Patient presents with  ? Fall  ? ? ?[name] is a 88 y.o. male w/ hx of UTI presenting to the emergency department generalized weakness, hypoxia.  Supplemental history is provided by the paramedics as well as the patient's family friend Eduard Clos at bedside, who helps care for the patient.  They report that the patient had a PT and OT evaluation today, no one can get a reliable oxygen reading on his finger.  On his own pulse oximeter he is on oxygen reading reporting 70%.  They called her doctor told him to come to the ER.  The patient generally feels at baseline levels from when he was discharged in the hospital, admits that he has had some gradual progressive decline in both his breathing and physical function, that he is essentially wheelchair-bound aside from some small transfers with a walker.  He has chronic lower extremity edema with venous stasis ulceration of the left lower extremity, for which he has wound care nursing come out to the house.  His friend Eduard Clos at the bedside reports the patient has had some memory issues and cognitive decline over the past few months, but is still able to live largely independently, and takes his medications reliably with an Environmental education officer. ? ?Per review of the medical chart, the patient is discharged in the hospital approximately 1 month ago after hospitalization for UTI, also found to be hyponatremic, transient hypotension and hyperkalemic with an AKI. ? ? ?HPI ? ?  ? ?Home Medications ?Prior to Admission medications   ?Medication Sig Start Date End Date Taking? Authorizing Provider  ?Cholecalciferol (VITAMIN D3) 125 MCG (5000 UT) CAPS Take 5,000 Units by mouth daily.   Yes [provider]  ?cloNIDine (CATAPRES) 0.2 MG tablet Take 0.2 mg by mouth 2 (two) times daily.   Yes [provider]   ?diclofenac (VOLTAREN) 75 MG EC tablet Take 75 mg by mouth 2 (two) times daily.   Yes [provider]  ?eplerenone (INSPRA) 25 MG tablet Take 25 mg by mouth daily.   Yes [provider]  ?finasteride (PROSCAR) 5 MG tablet Take 5 mg by mouth daily.   Yes [provider]  ?folic acid (FOLVITE) 1 MG tablet Take 1 mg by mouth every morning.   Yes [provider]  ?furosemide (LASIX) 40 MG tablet Take 40 mg by mouth 2 (two) times daily.   Yes [provider]  ?gabapentin (NEURONTIN) 300 MG capsule Take 300 mg by mouth 2 (two) times daily.   Yes [provider]  ?LORazepam (ATIVAN) 0.5 MG tablet Take 1 tablet (0.5 mg total) by mouth every 6 (six) hours as needed for anxiety. 08/05/21  Yes [name], MD  ?methotrexate (RHEUMATREX) 2.5 MG tablet Take 7.5 mg by mouth every Friday. Caution:Chemotherapy. Protect from light.  ?Friday   Yes [provider]  ?metoprolol (TOPROL-XL) 100 MG 24 hr tablet Take 150 mg by mouth daily.   Yes [provider]  ?montelukast (SINGULAIR) 10 MG tablet Take 10 mg by mouth daily. 05/18/18  Yes [provider]  ?Multiple Vitamins-Minerals (CENTRUM SILVER 50+MEN PO) Take 1 tablet by mouth daily.   Yes [provider]  ?omega-3 acid ethyl esters (LOVAZA) 1 g capsule Take 1 g by mouth 2 (two) times daily.   Yes [provider]  ?OXYGEN  Inhale 1 L into the lungs daily as needed (shortness of breath).   Yes [provider]  ?polyethylene glycol (MIRALAX / GLYCOLAX) packet Take 17 g by mouth daily as needed for mild constipation.   Yes [provider]  ?traMADol (ULTRAM) 50 MG tablet Take 1 tablet (50 mg total) by mouth every 6 (six) hours as needed. ?Patient taking differently: Take 50 mg by mouth every 6 (six) hours as needed for moderate pain. 08/05/21  Yes [name], MD  ?cloNIDine (CATAPRES) 0.1 MG tablet Take 1 tablet (0.1 mg total) by mouth 2 (two) times daily. ?Patient not  taking: Reported on 09/01/2021 08/05/21   [name], MD  ?furosemide (LASIX) 20 MG tablet Take 1 tablet (20 mg total) by mouth daily. ?Patient not taking: Reported on 09/01/2021 08/06/21   [name], MD  ?senna-docusate (SENOKOT-S) 8.6-50 MG tablet Take 1 tablet by mouth 2 (two) times daily as needed for mild constipation. ?Patient not taking: Reported on 09/01/2021 07/16/21   Mercy Riding, MD  ?Skin Protectants, Misc. (EUCERIN) cream Apply to back and other affected area of skin  twice a day ?Patient not taking: Reported on 09/01/2021 07/16/21   Mercy Riding, MD  ?   ? ?Allergies    ?Spironolactone and Hydrocodone   ? ?Review of Systems   ?Review of Systems ? ?Physical Exam ?Updated Vital Signs ?BP (!) 153/54   Pulse 77   Temp 98.2 ?F (36.8 ?C) (Oral)   Resp (!) 22   Ht '5\' 10"'$  (1.778 m)   Wt 115.2 kg   SpO2 100%   BMI 36.45 kg/m?  ?Physical Exam ?Constitutional:   ?   General: He is not in acute distress. ?HENT:  ?   Head: Normocephalic and atraumatic.  ?Eyes:  ?   Conjunctiva/sclera: Conjunctivae normal.  ?   Pupils: Pupils are equal, round, and reactive to light.  ?Cardiovascular:  ?   Rate and Rhythm: Normal rate and regular rhythm.  ?Pulmonary:  ?   Effort: Pulmonary effort is normal. No respiratory distress.  ?   Comments: 88% on room air, 95% on 2L [place] ?Crackles lower lungs ?Abdominal:  ?   General: There is no distension.  ?   Tenderness: There is no abdominal tenderness.  ?Musculoskeletal:  ?   Right lower leg: Edema present.  ?   Left lower leg: Edema present.  ?   Comments: Redness and warmth of the bilateral lower extremities, left more than right, with an ulceration that is appropriately dressed in the left lower leg  ?Skin: ?   General: Skin is warm and dry.  ?Neurological:  ?   General: No focal deficit present.  ?   Mental Status: He is alert. Mental status is at baseline.  ?Psychiatric:     ?   Mood and Affect: Mood normal.     ?   Behavior: Behavior normal.  ? ? ?ED Results / Procedures /  Treatments   ?Labs ?(all labs ordered are listed, but only abnormal results are displayed) ?Labs Reviewed  ?BRAIN NATRIURETIC PEPTIDE - Abnormal; Notable for the following components:  ?    Result Value  ? B Natriuretic Peptide 342.3 (*)   ? All other components within normal limits  ?BASIC METABOLIC PANEL - Abnormal; Notable for the following components:  ? Sodium 128 (*)   ? Chloride 94 (*)   ? Glucose, Bld 102 (*)   ? BUN 25 (*)   ? Calcium  8.5 (*)   ? GFR, Estimated 58 (*)   ? All other components within normal limits  ?CBC WITH DIFFERENTIAL/PLATELET - Abnormal; Notable for the following components:  ? WBC 15.8 (*)   ? RBC 2.59 (*)   ? Hemoglobin 8.9 (*)   ? HCT 27.1 (*)   ? MCV 104.6 (*)   ? MCH 34.4 (*)   ? Neutro Abs 13.4 (*)   ? Abs Immature Granulocytes 0.11 (*)   ? All other components within normal limits  ?URINALYSIS, DIPSTICK ONLY - Abnormal; Notable for the following components:  ? APPearance HAZY (*)   ? Leukocytes,Ua TRACE (*)   ? All other components within normal limits  ?RESP PANEL BY RT-PCR (FLU A&B, COVID) ARPGX2  ?TROPONIN I (HIGH SENSITIVITY)  ?TROPONIN I (HIGH SENSITIVITY)  ? ? ?EKG ?None ? ?Radiology ?DG Chest 2 View ? ?Result Date: 09/01/2021 ?CLINICAL DATA:  New hypoxia. EXAM: CHEST - 2 VIEW COMPARISON:  Chest x-ray 08/02/2021 FINDINGS: There is stable mild elevation of the right hemidiaphragm. There is no focal lung infiltrate, pleural effusion or pneumothorax. Cardiomediastinal silhouette is within normal limits and stable. Left shoulder arthroplasty is again seen. IMPRESSION: No active cardiopulmonary disease. Electronically Signed   By: Ronney Asters M.D.   On: 09/01/2021 19:35   ? ?Procedures ?Procedures  ? ? ?Medications Ordered in ED ?Medications  ?famotidine (PEPCID) tablet 20 mg (20 mg Oral Given 09/01/21 2009)  ?traMADol (ULTRAM) tablet 50 mg (has no administration in time range)  ?gabapentin (NEURONTIN) capsule 300 mg (300 mg Oral Given 09/01/21 2136)  ?melatonin tablet 3 mg (has  no administration in time range)  ?vancomycin (VANCOREADY) IVPB 2000 mg/400 mL (2,000 mg Intravenous New Bag/Given 09/01/21 2139)  ?vancomycin (VANCOREADY) IVPB 1250 mg/250 mL (has no administration in time range)  ?

## 2021-09-02 ENCOUNTER — Inpatient Hospital Stay (HOSPITAL_COMMUNITY): Payer: Medicare PPO

## 2021-09-02 DIAGNOSIS — N400 Enlarged prostate without lower urinary tract symptoms: Secondary | ICD-10-CM | POA: Diagnosis present

## 2021-09-02 DIAGNOSIS — L03115 Cellulitis of right lower limb: Secondary | ICD-10-CM | POA: Diagnosis present

## 2021-09-02 DIAGNOSIS — Z6836 Body mass index (BMI) 36.0-36.9, adult: Secondary | ICD-10-CM | POA: Diagnosis not present

## 2021-09-02 DIAGNOSIS — E871 Hypo-osmolality and hyponatremia: Secondary | ICD-10-CM | POA: Diagnosis present

## 2021-09-02 DIAGNOSIS — Z79899 Other long term (current) drug therapy: Secondary | ICD-10-CM | POA: Diagnosis not present

## 2021-09-02 DIAGNOSIS — L0390 Cellulitis, unspecified: Secondary | ICD-10-CM | POA: Diagnosis present

## 2021-09-02 DIAGNOSIS — I5033 Acute on chronic diastolic (congestive) heart failure: Secondary | ICD-10-CM | POA: Diagnosis present

## 2021-09-02 DIAGNOSIS — Z9981 Dependence on supplemental oxygen: Secondary | ICD-10-CM | POA: Diagnosis not present

## 2021-09-02 DIAGNOSIS — G629 Polyneuropathy, unspecified: Secondary | ICD-10-CM | POA: Diagnosis present

## 2021-09-02 DIAGNOSIS — S81802A Unspecified open wound, left lower leg, initial encounter: Secondary | ICD-10-CM | POA: Diagnosis not present

## 2021-09-02 DIAGNOSIS — R609 Edema, unspecified: Secondary | ICD-10-CM

## 2021-09-02 DIAGNOSIS — I96 Gangrene, not elsewhere classified: Secondary | ICD-10-CM | POA: Diagnosis present

## 2021-09-02 DIAGNOSIS — L03119 Cellulitis of unspecified part of limb: Secondary | ICD-10-CM | POA: Diagnosis not present

## 2021-09-02 DIAGNOSIS — S81812A Laceration without foreign body, left lower leg, initial encounter: Secondary | ICD-10-CM | POA: Diagnosis present

## 2021-09-02 DIAGNOSIS — Z888 Allergy status to other drugs, medicaments and biological substances status: Secondary | ICD-10-CM | POA: Diagnosis not present

## 2021-09-02 DIAGNOSIS — Z20822 Contact with and (suspected) exposure to covid-19: Secondary | ICD-10-CM | POA: Diagnosis present

## 2021-09-02 DIAGNOSIS — W19XXXA Unspecified fall, initial encounter: Secondary | ICD-10-CM | POA: Diagnosis not present

## 2021-09-02 DIAGNOSIS — Z66 Do not resuscitate: Secondary | ICD-10-CM | POA: Diagnosis present

## 2021-09-02 DIAGNOSIS — J45909 Unspecified asthma, uncomplicated: Secondary | ICD-10-CM | POA: Diagnosis present

## 2021-09-02 DIAGNOSIS — I110 Hypertensive heart disease with heart failure: Secondary | ICD-10-CM | POA: Diagnosis present

## 2021-09-02 DIAGNOSIS — Z993 Dependence on wheelchair: Secondary | ICD-10-CM | POA: Diagnosis not present

## 2021-09-02 DIAGNOSIS — E669 Obesity, unspecified: Secondary | ICD-10-CM | POA: Diagnosis present

## 2021-09-02 DIAGNOSIS — I10 Essential (primary) hypertension: Secondary | ICD-10-CM

## 2021-09-02 DIAGNOSIS — Z885 Allergy status to narcotic agent status: Secondary | ICD-10-CM | POA: Diagnosis not present

## 2021-09-02 DIAGNOSIS — R0902 Hypoxemia: Secondary | ICD-10-CM

## 2021-09-02 DIAGNOSIS — L120 Bullous pemphigoid: Secondary | ICD-10-CM | POA: Diagnosis present

## 2021-09-02 DIAGNOSIS — Y92019 Unspecified place in single-family (private) house as the place of occurrence of the external cause: Secondary | ICD-10-CM | POA: Diagnosis not present

## 2021-09-02 DIAGNOSIS — M47816 Spondylosis without myelopathy or radiculopathy, lumbar region: Secondary | ICD-10-CM | POA: Diagnosis present

## 2021-09-02 DIAGNOSIS — L03116 Cellulitis of left lower limb: Secondary | ICD-10-CM | POA: Diagnosis present

## 2021-09-02 DIAGNOSIS — M7989 Other specified soft tissue disorders: Secondary | ICD-10-CM

## 2021-09-02 DIAGNOSIS — Z96653 Presence of artificial knee joint, bilateral: Secondary | ICD-10-CM | POA: Diagnosis present

## 2021-09-02 DIAGNOSIS — W010XXA Fall on same level from slipping, tripping and stumbling without subsequent striking against object, initial encounter: Secondary | ICD-10-CM | POA: Diagnosis present

## 2021-09-02 DIAGNOSIS — D539 Nutritional anemia, unspecified: Secondary | ICD-10-CM | POA: Diagnosis present

## 2021-09-02 DIAGNOSIS — F419 Anxiety disorder, unspecified: Secondary | ICD-10-CM | POA: Diagnosis present

## 2021-09-02 LAB — CBC
HCT: 29.8 % — ABNORMAL LOW (ref 39.0–52.0)
Hemoglobin: 9.7 g/dL — ABNORMAL LOW (ref 13.0–17.0)
MCH: 34.6 pg — ABNORMAL HIGH (ref 26.0–34.0)
MCHC: 32.6 g/dL (ref 30.0–36.0)
MCV: 106.4 fL — ABNORMAL HIGH (ref 80.0–100.0)
Platelets: 155 10*3/uL (ref 150–400)
RBC: 2.8 MIL/uL — ABNORMAL LOW (ref 4.22–5.81)
RDW: 15.2 % (ref 11.5–15.5)
WBC: 15.4 10*3/uL — ABNORMAL HIGH (ref 4.0–10.5)
nRBC: 0 % (ref 0.0–0.2)

## 2021-09-02 LAB — VITAMIN B12: Vitamin B-12: 420 pg/mL (ref 180–914)

## 2021-09-02 LAB — BASIC METABOLIC PANEL
Anion gap: 11 (ref 5–15)
BUN: 21 mg/dL (ref 8–23)
CO2: 27 mmol/L (ref 22–32)
Calcium: 8.7 mg/dL — ABNORMAL LOW (ref 8.9–10.3)
Chloride: 93 mmol/L — ABNORMAL LOW (ref 98–111)
Creatinine, Ser: 1.18 mg/dL (ref 0.61–1.24)
GFR, Estimated: >60 mL/min (ref >60)
Glucose, Bld: 104 mg/dL — ABNORMAL HIGH (ref 70–99)
Potassium: 3.3 mmol/L — ABNORMAL LOW (ref 3.5–5.1)
Sodium: 131 mmol/L — ABNORMAL LOW (ref 135–145)

## 2021-09-02 LAB — URINALYSIS, ROUTINE W REFLEX MICROSCOPIC
Bilirubin Urine: NEGATIVE
Glucose, UA: NEGATIVE mg/dL
Hgb urine dipstick: NEGATIVE
Ketones, ur: NEGATIVE mg/dL
Leukocytes,Ua: NEGATIVE
Nitrite: NEGATIVE
Protein, ur: NEGATIVE mg/dL
Specific Gravity, Urine: 1.009 (ref 1.005–1.030)
pH: 6 (ref 5.0–8.0)

## 2021-09-02 LAB — D-DIMER, QUANTITATIVE: D-Dimer, Quant: 1.92 ug/mL-FEU — ABNORMAL HIGH (ref 0.00–0.50)

## 2021-09-02 LAB — MAGNESIUM: Magnesium: 1.7 mg/dL (ref 1.7–2.4)

## 2021-09-02 LAB — FOLATE: Folate: 26.3 ng/mL (ref >5.9)

## 2021-09-02 MED ORDER — CLONIDINE HCL 0.2 MG PO TABS
0.2000 mg | ORAL_TABLET | Freq: Two times a day (BID) | ORAL | Status: DC
  Administered 2021-09-02 – 2021-09-08 (×14): 0.2 mg via ORAL
  Filled 2021-09-02 (×14): qty 1

## 2021-09-02 MED ORDER — IOHEXOL 350 MG/ML SOLN
100.0000 mL | Freq: Once | INTRAVENOUS | Status: AC | PRN
  Administered 2021-09-02: 50 mL via INTRAVENOUS

## 2021-09-02 MED ORDER — FOLIC ACID 1 MG PO TABS
1.0000 mg | ORAL_TABLET | Freq: Every morning | ORAL | Status: DC
  Administered 2021-09-02 – 2021-09-08 (×7): 1 mg via ORAL
  Filled 2021-09-02 (×7): qty 1

## 2021-09-02 MED ORDER — ALBUTEROL SULFATE (2.5 MG/3ML) 0.083% IN NEBU
2.5000 mg | INHALATION_SOLUTION | Freq: Four times a day (QID) | RESPIRATORY_TRACT | Status: DC | PRN
  Administered 2021-09-02: 2.5 mg via RESPIRATORY_TRACT
  Filled 2021-09-02: qty 3

## 2021-09-02 MED ORDER — FUROSEMIDE 10 MG/ML IJ SOLN
40.0000 mg | Freq: Once | INTRAMUSCULAR | Status: AC
  Administered 2021-09-02: 40 mg via INTRAVENOUS
  Filled 2021-09-02: qty 4

## 2021-09-02 MED ORDER — FINASTERIDE 5 MG PO TABS
5.0000 mg | ORAL_TABLET | Freq: Every day | ORAL | Status: DC
  Administered 2021-09-02 – 2021-09-08 (×7): 5 mg via ORAL
  Filled 2021-09-02 (×7): qty 1

## 2021-09-02 MED ORDER — GUAIFENESIN-DM 100-10 MG/5ML PO SYRP
5.0000 mL | ORAL_SOLUTION | ORAL | Status: DC | PRN
  Administered 2021-09-02: 5 mL via ORAL
  Filled 2021-09-02: qty 5

## 2021-09-02 MED ORDER — LORAZEPAM 0.5 MG PO TABS
0.5000 mg | ORAL_TABLET | Freq: Four times a day (QID) | ORAL | Status: DC | PRN
  Administered 2021-09-03 – 2021-09-07 (×8): 0.5 mg via ORAL
  Filled 2021-09-02 (×8): qty 1

## 2021-09-02 MED ORDER — MONTELUKAST SODIUM 10 MG PO TABS
10.0000 mg | ORAL_TABLET | Freq: Every day | ORAL | Status: DC
  Administered 2021-09-02 – 2021-09-08 (×7): 10 mg via ORAL
  Filled 2021-09-02 (×7): qty 1

## 2021-09-02 MED ORDER — VANCOMYCIN HCL 1250 MG/250ML IV SOLN
1250.0000 mg | INTRAVENOUS | Status: DC
  Administered 2021-09-02 – 2021-09-07 (×6): 1250 mg via INTRAVENOUS
  Filled 2021-09-02 (×8): qty 250

## 2021-09-02 MED ORDER — METOPROLOL SUCCINATE ER 25 MG PO TB24
150.0000 mg | ORAL_TABLET | Freq: Every day | ORAL | Status: DC
  Administered 2021-09-02 – 2021-09-08 (×6): 150 mg via ORAL
  Filled 2021-09-02 (×7): qty 2

## 2021-09-02 MED ORDER — FUROSEMIDE 10 MG/ML IJ SOLN
40.0000 mg | Freq: Two times a day (BID) | INTRAMUSCULAR | Status: DC
  Administered 2021-09-02 – 2021-09-08 (×13): 40 mg via INTRAVENOUS
  Filled 2021-09-02 (×13): qty 4

## 2021-09-02 NOTE — Progress Notes (Signed)
Heart Failure Navigator Progress Note ? ?Assessed for Heart & Vascular TOC clinic readiness.  ?Patient does not meet criteria due to No benefit at this time. .  ? ? ? ?Earnestine Leys, BSN, RN ?Heart Failure Nurse Navigator ?Secure Chat Only   ?

## 2021-09-02 NOTE — ED Notes (Signed)
Pt to vascular.

## 2021-09-02 NOTE — Progress Notes (Signed)
Lower extremity venous has been completed.  ? ?Preliminary results in CV Proc.  ? ?Dari Carpenito Hayslee Casebolt ?09/02/2021 10:37 AM    ?

## 2021-09-02 NOTE — ED Notes (Signed)
Pt returned from radiology. Pt is currently c/o SOB at this time. Audible wheezing noted. Admit provider paged at this time ?

## 2021-09-02 NOTE — Consult Note (Addendum)
WOC Nurse Consult Note: ?Reason for Consult: Skin tear to left LE ?Wound type:Trauma ?Pressure Injury POA: N/A ?Measurement:8cm x 10.5cm with purple, soft and fluctuant flap re approximated to 75% ?Wound bed: As noted above.  25% red, moist ?Drainage (amount, consistency, odor) Small serous on old dressing ?Periwound: erythematous, edematous from midfoot to knee ?Dressing procedure/placement/frequency: I have provided Nursing with topical care guidance using a NS cleanse and pat dry followed by placement of a folded layer of xeroform gauze (antimicrobial, nonadherent) topped with an ABD pad. This is to be secured with Kerlix roll gauze wrapped from just below toes to just below knee and the Kerlix topped with a 6-inch ACE bandage wrapped in a similar manner for gentle compression. ? ?Recommend consideration of CCS (Surgical consult) for removal of skin flap and possible hematoma to prevent delay in healing due to weight on wound bed and also to remove source of bacteria. If you agree, please order/arrange. ? ? ?[place] nursing team will not follow, but will remain available to this patient, the nursing and medical teams.  Please re-consult if needed. ?Thanks, ?Maudie Flakes, MSN, RN, [place], [place], [facility], [place]  ?Pager# [phone]  ? ? ? ? ?  ?

## 2021-09-02 NOTE — ED Notes (Signed)
Admit provider and resp at bedside at this time ?

## 2021-09-02 NOTE — ED Notes (Signed)
Breakfast order placed ?

## 2021-09-02 NOTE — Plan of Care (Signed)
?  Problem: Education: ?Goal: Knowledge of General Education information will improve ?Description: Including pain rating scale, medication(s)/side effects and non-pharmacologic comfort measures ?09/02/2021 1832 by Santa Lighter, RN ?Outcome: Progressing ?09/02/2021 1634 by Santa Lighter, RN ?Outcome: Progressing ?  ?Problem: Health Behavior/Discharge Planning: ?Goal: Ability to manage health-related needs will improve ?Outcome: Progressing ?  ?Problem: Clinical Measurements: ?Goal: Ability to maintain clinical measurements within normal limits will improve ?09/02/2021 1832 by Santa Lighter, RN ?Outcome: Progressing ?09/02/2021 1634 by Santa Lighter, RN ?Outcome: Progressing ?  ?

## 2021-09-02 NOTE — Consult Note (Signed)
Reason for Consult/CC: Left leg wound  [name] is an 88 y.o. male.  HPI: Patient presents to the hospital with a number of medical issues.  He had a fall a few days back and avulsed a segment of skin from his left shin.  He has been doing conservative measures with wound care.  Our wound care team was consulted and recommended surgical debridement of the eschar.  General surgery and orthopedic surgery have been called but recommended my evaluation.  Patient has limited sensation in the distal lower extremities due to neuropathy.  Allergies:  Allergies  Allergen Reactions   Spironolactone Nausea Only   Hydrocodone Nausea And Vomiting    Medications:  Current Facility-Administered Medications:    albuterol (PROVENTIL) (2.5 MG/3ML) 0.083% nebulizer solution 2.5 mg, 2.5 mg, Nebulization, Q6H PRN, Shela Leff, MD, 2.5 mg at 09/02/21 0054   cloNIDine (CATAPRES) tablet 0.2 mg, 0.2 mg, Oral, BID, Shela Leff, MD, 0.2 mg at 09/02/21 0926   finasteride (PROSCAR) tablet 5 mg, 5 mg, Oral, Daily, Shela Leff, MD, 5 mg at 54/62/70 3500   folic acid (FOLVITE) tablet 1 mg, 1 mg, Oral, q morning, Shela Leff, MD, 1 mg at 09/02/21 1103   furosemide (LASIX) injection 40 mg, 40 mg, Intravenous, BID, Shela Leff, MD, 40 mg at 09/02/21 0740   gabapentin (NEURONTIN) capsule 300 mg, 300 mg, Oral, BID, Shela Leff, MD, 300 mg at 09/02/21 0926   LORazepam (ATIVAN) tablet 0.5 mg, 0.5 mg, Oral, Q6H PRN, Shela Leff, MD   melatonin tablet 3 mg, 3 mg, Oral, QHS PRN, Shela Leff, MD   metoprolol succinate (TOPROL-XL) 24 hr tablet 150 mg, 150 mg, Oral, Daily, Shela Leff, MD, 150 mg at 09/02/21 0925   montelukast (SINGULAIR) tablet 10 mg, 10 mg, Oral, Daily, Shela Leff, MD, 10 mg at 09/02/21 1103   traMADol (ULTRAM) tablet 50 mg, 50 mg, Oral, Q6H PRN, Shela Leff, MD   vancomycin (VANCOREADY) IVPB 1250 mg/250 mL, 1,250 mg, Intravenous,  Q24H, Heloise Purpura, RPH  Current Outpatient Medications:    Cholecalciferol (VITAMIN D3) 125 MCG (5000 UT) CAPS, Take 5,000 Units by mouth daily., Disp: , Rfl:    cloNIDine (CATAPRES) 0.2 MG tablet, Take 0.2 mg by mouth 2 (two) times daily., Disp: , Rfl:    diclofenac (VOLTAREN) 75 MG EC tablet, Take 75 mg by mouth 2 (two) times daily., Disp: , Rfl:    eplerenone (INSPRA) 25 MG tablet, Take 25 mg by mouth daily., Disp: , Rfl:    finasteride (PROSCAR) 5 MG tablet, Take 5 mg by mouth daily., Disp: , Rfl:    folic acid (FOLVITE) 1 MG tablet, Take 1 mg by mouth every morning., Disp: , Rfl:    furosemide (LASIX) 40 MG tablet, Take 40 mg by mouth 2 (two) times daily., Disp: , Rfl:    gabapentin (NEURONTIN) 300 MG capsule, Take 300 mg by mouth 2 (two) times daily., Disp: , Rfl:    LORazepam (ATIVAN) 0.5 MG tablet, Take 1 tablet (0.5 mg total) by mouth every 6 (six) hours as needed for anxiety., Disp: 5 tablet, Rfl: 0   methotrexate (RHEUMATREX) 2.5 MG tablet, Take 7.5 mg by mouth every Friday. Caution:Chemotherapy. Protect from light.  Friday, Disp: , Rfl:    metoprolol (TOPROL-XL) 100 MG 24 hr tablet, Take 150 mg by mouth daily., Disp: , Rfl:    montelukast (SINGULAIR) 10 MG tablet, Take 10 mg by mouth daily., Disp: , Rfl:    Multiple Vitamins-Minerals (CENTRUM SILVER  50+MEN PO), Take 1 tablet by mouth daily., Disp: , Rfl:    omega-3 acid ethyl esters (LOVAZA) 1 g capsule, Take 1 g by mouth 2 (two) times daily., Disp: , Rfl:    OXYGEN, Inhale 1 L into the lungs daily as needed (shortness of breath)., Disp: , Rfl:    polyethylene glycol (MIRALAX / GLYCOLAX) packet, Take 17 g by mouth daily as needed for mild constipation., Disp: , Rfl:    traMADol (ULTRAM) 50 MG tablet, Take 1 tablet (50 mg total) by mouth every 6 (six) hours as needed. (Patient taking differently: Take 50 mg by mouth every 6 (six) hours as needed for moderate pain.), Disp: 5 tablet, Rfl: 0   cloNIDine (CATAPRES) 0.1 MG tablet,  Take 1 tablet (0.1 mg total) by mouth 2 (two) times daily. (Patient not taking: Reported on 09/01/2021), Disp: 60 tablet, Rfl: 11   furosemide (LASIX) 20 MG tablet, Take 1 tablet (20 mg total) by mouth daily. (Patient not taking: Reported on 09/01/2021), Disp: 30 tablet, Rfl: 2   senna-docusate (SENOKOT-S) 8.6-50 MG tablet, Take 1 tablet by mouth 2 (two) times daily as needed for mild constipation. (Patient not taking: Reported on 09/01/2021), Disp: , Rfl:    Skin Protectants, Misc. (EUCERIN) cream, Apply to back and other affected area of skin  twice a day (Patient not taking: Reported on 09/01/2021), Disp: 454 g, Rfl: 0  Past Medical History:  Diagnosis Date   Anxiety    "pt. denies" 10-11-12   Arthritis    degenerative arthritis    Arthritis, lumbar spine    BPH (benign prostatic hyperplasia)    Dysrhythmia    1st degree heart block  palpitations 04/07/2011, seen & complete eval. /w Dr. Radford Pax- stress, echo, holter monitor, has been cleared for surg.    Edema of both legs    improved with use of meds/ compression socks used.   Heart palpitations 2012   Hypertension    Pemphigoid    PONV (postoperative nausea and vomiting)    PVC's (premature ventricular contractions)    Spinal stenosis     Past Surgical History:  Procedure Laterality Date   BACK SURGERY  01/2011   02/2011   JOINT REPLACEMENT     R knee replacement-  10/06, L shoulder - 3/07, R hip- replacement- '07   TOTAL HIP ARTHROPLASTY  2007   right total hip replacement   TOTAL HIP ARTHROPLASTY Left 10/14/2012   Procedure: LEFT TOTAL HIP ARTHROPLASTY ANTERIOR APPROACH;  Surgeon: Mcarthur Rossetti, MD;  Location: [facility];  Service: Orthopedics;  Laterality: Left;   TOTAL KNEE ARTHROPLASTY  2007   right total knee   TOTAL KNEE ARTHROPLASTY  06/01/2011   Procedure: TOTAL KNEE ARTHROPLASTY;  Surgeon: Lorn Junes, MD;  Location: [facility];  Service: Orthopedics;  Laterality: Left;   TOTAL SHOULDER ARTHROPLASTY Left 2007     Family History  Problem Relation Age of Onset   Heart attack Mother    Hypertension Mother    Suicidality Father    Heart disease Sister    Anesthesia problems Neg Hx    Hypotension Neg Hx    Malignant hyperthermia Neg Hx    Pseudochol deficiency Neg Hx    Colon cancer Neg Hx    Esophageal cancer Neg Hx    Rectal cancer Neg Hx    Stomach cancer Neg Hx     Social History:  reports that he quit smoking about 37 years ago. He has never used smokeless  tobacco. He reports that he does not drink alcohol and does not use drugs.  Physical Exam Blood pressure (!) 145/50, pulse 72, temperature 98.2 F (36.8 C), temperature source Oral, resp. rate 20, height '5\' 10"'$  (1.778 m), weight 115.2 kg, SpO2 94 %. General: No acute distress.  Alert and oriented Left leg: Foot and toes are well-perfused with normal capillary refill.  He has severe pitting edema bilateral lower extremities.  There is a 6 x 6 cm necrotic eschar on the anterior aspect in the distal third of the left lower leg.  No purulence or signs of infection.  Results for orders placed or performed during the hospital encounter of 09/01/21 (from the past 48 hour(s))  Urinalysis, dipstick only     Status: Abnormal   Collection Time: 09/01/21  6:29 PM  Result Value Ref Range   Color, Urine YELLOW YELLOW   APPearance HAZY (A) CLEAR   Specific Gravity, Urine 1.013 1.005 - 1.030   pH 5.0 5.0 - 8.0   Glucose, UA NEGATIVE NEGATIVE mg/dL   Hgb urine dipstick NEGATIVE NEGATIVE   Bilirubin Urine NEGATIVE NEGATIVE   Ketones, ur NEGATIVE NEGATIVE mg/dL   Protein, ur NEGATIVE NEGATIVE mg/dL   Nitrite NEGATIVE NEGATIVE   Leukocytes,Ua TRACE (A) NEGATIVE    Comment: Performed at [facility] [address] [place], [place] [address]  Brain natriuretic peptide     Status: Abnormal   Collection Time: 09/01/21  7:05 PM  Result Value Ref Range   B Natriuretic Peptide 342.3 (H) 0.0 - 100.0 pg/mL    Comment: Performed at [facility] [address] [place], [place] [address]  Basic metabolic panel     Status: Abnormal   Collection Time: 09/01/21  7:05 PM  Result Value Ref Range   Sodium 128 (L) 135 - 145 mmol/L   Potassium 3.5 3.5 - 5.1 mmol/L   Chloride 94 (L) 98 - 111 mmol/L   CO2 26 22 - 32 mmol/L   Glucose, Bld 102 (H) 70 - 99 mg/dL    Comment: Glucose reference range applies only to samples taken after fasting for at least 8 hours.   BUN 25 (H) 8 - 23 mg/dL   Creatinine, Ser 1.21 0.61 - 1.24 mg/dL   Calcium 8.5 (L) 8.9 - 10.3 mg/dL   GFR, Estimated 58 (L) >60 mL/min    Comment: (NOTE) Calculated using the CKD-EPI Creatinine Equation (2021)    Anion gap 8 5 - 15    Comment: Performed at [facility] [address], [place], [place] [address]  CBC with Differential     Status: Abnormal   Collection Time: 09/01/21  7:05 PM  Result Value Ref Range   WBC 15.8 (H) 4.0 - 10.5 K/uL   RBC 2.59 (L) 4.22 - 5.81 MIL/uL   Hemoglobin 8.9 (L) 13.0 - 17.0 g/dL   HCT 27.1 (L) 39.0 - 52.0 %   MCV 104.6 (H) 80.0 - 100.0 fL   MCH 34.4 (H) 26.0 - 34.0 pg   MCHC 32.8 30.0 - 36.0 g/dL   RDW 15.1 11.5 - 15.5 %   Platelets 155 150 - 400 K/uL   nRBC 0.0 0.0 - 0.2 %   Neutrophils Relative % 85 %   Neutro Abs 13.4 (H) 1.7 - 7.7 K/uL   Lymphocytes Relative 7 %   Lymphs Abs 1.2 0.7 - 4.0 K/uL   Monocytes Relative 7 %   Monocytes Absolute 1.0 0.1 - 1.0  K/uL   Eosinophils Relative 0 %   Eosinophils Absolute 0.0 0.0 - 0.5 K/uL   Basophils Relative 0 %   Basophils Absolute 0.0 0.0 - 0.1 K/uL   Immature Granulocytes 1 %   Abs Immature Granulocytes 0.11 (H) 0.00 - 0.07 K/uL    Comment: Performed at [facility] [address] [place], [place] [address]  Troponin I (High Sensitivity)     Status: None   Collection Time: 09/01/21  7:05 PM  Result Value Ref Range   Troponin I (High Sensitivity) 5 <18 ng/L    Comment: (NOTE) Elevated high sensitivity troponin I (hsTnI) values and significant  changes  across serial measurements may suggest ACS but many other  chronic and acute conditions are known to elevate hsTnI results.  Refer to the "Links" section for chest pain algorithms and additional  guidance. Performed at [facility], [place] [address], [place], [place] [address]   Resp Panel by RT-PCR (Flu A&B, Covid)     Status: None   Collection Time: 09/01/21  7:05 PM   Specimen: Nasopharyngeal(NP) swabs in vial transport medium  Result Value Ref Range   SARS Coronavirus 2 by RT PCR NEGATIVE NEGATIVE    Comment: (NOTE) SARS-CoV-2 target nucleic acids are NOT DETECTED.  The SARS-CoV-2 RNA is generally detectable in upper respiratory specimens during the acute phase of infection. The lowest concentration of SARS-CoV-2 viral copies this assay can detect is 138 copies/mL. A negative result does not preclude SARS-Cov-2 infection and should not be used as the sole basis for treatment or other patient management decisions. A negative result may occur with  improper specimen collection/handling, submission of specimen other than nasopharyngeal swab, presence of viral mutation(s) within the areas targeted by this assay, and inadequate number of viral copies(<138 copies/mL). A negative result must be combined with clinical observations, patient history, and epidemiological information. The expected result is Negative.  Fact Sheet for Patients:  EntrepreneurPulse.com.au  Fact Sheet for Healthcare Providers:  IncredibleEmployment.be  This test is no t yet approved or cleared by the [place] FDA and  has been authorized for detection and/or diagnosis of SARS-CoV-2 by FDA under an Emergency Use Authorization (EUA). This EUA will remain  in effect (meaning this test can be used) for the duration of the COVID-19 declaration under Section 564(b)(1) of the Act, 21 U.S.C.section 360bbb-3(b)(1), unless the authorization is terminated  or revoked  sooner.       Influenza A by PCR NEGATIVE NEGATIVE   Influenza B by PCR NEGATIVE NEGATIVE    Comment: (NOTE) The Xpert Xpress SARS-CoV-2/FLU/RSV plus assay is intended as an aid in the diagnosis of influenza from Nasopharyngeal swab specimens and should not be used as a sole basis for treatment. Nasal washings and aspirates are unacceptable for Xpert Xpress SARS-CoV-2/FLU/RSV testing.  Fact Sheet for Patients: EntrepreneurPulse.com.au  Fact Sheet for Healthcare Providers: IncredibleEmployment.be  This test is not yet approved or cleared by the [place] FDA and has been authorized for detection and/or diagnosis of SARS-CoV-2 by FDA under an Emergency Use Authorization (EUA). This EUA will remain in effect (meaning this test can be used) for the duration of the COVID-19 declaration under Section 564(b)(1) of the Act, 21 U.S.C. section 360bbb-3(b)(1), unless the authorization is terminated or revoked.  Performed at [facility], [place] [address], [place], [place] [address]   Troponin I (High Sensitivity)     Status: None   Collection Time: 09/01/21  9:30 PM  Result Value Ref Range   Troponin I (High Sensitivity) 5 <18 ng/L    Comment: (NOTE) Elevated high sensitivity troponin I (hsTnI) values and significant  changes across serial measurements may suggest ACS but many other  chronic and acute conditions are known to elevate hsTnI results.  Refer to the "Links" section for chest pain algorithms and additional  guidance. Performed at [facility], [place] [address], [place], [place] [address]   Urinalysis, Routine w reflex microscopic Urine, Clean Catch     Status: None   Collection Time: 09/02/21 12:12 AM  Result Value Ref Range   Color, Urine YELLOW YELLOW   APPearance CLEAR CLEAR   Specific Gravity, Urine 1.009 1.005 - 1.030   pH 6.0 5.0 - 8.0   Glucose, UA NEGATIVE NEGATIVE mg/dL   Hgb urine dipstick NEGATIVE NEGATIVE    Bilirubin Urine NEGATIVE NEGATIVE   Ketones, ur NEGATIVE NEGATIVE mg/dL   Protein, ur NEGATIVE NEGATIVE mg/dL   Nitrite NEGATIVE NEGATIVE   Leukocytes,Ua NEGATIVE NEGATIVE    Comment: Performed at [facility] [address] [place], [place] [address]  CBC     Status: Abnormal   Collection Time: 09/02/21  2:48 AM  Result Value Ref Range   WBC 15.4 (H) 4.0 - 10.5 K/uL   RBC 2.80 (L) 4.22 - 5.81 MIL/uL   Hemoglobin 9.7 (L) 13.0 - 17.0 g/dL   HCT 29.8 (L) 39.0 - 52.0 %   MCV 106.4 (H) 80.0 - 100.0 fL   MCH 34.6 (H) 26.0 - 34.0 pg   MCHC 32.6 30.0 - 36.0 g/dL   RDW 15.2 11.5 - 15.5 %   Platelets 155 150 - 400 K/uL   nRBC 0.0 0.0 - 0.2 %    Comment: Performed at [facility], [place] [address] [place], [place] [address]  Basic metabolic panel     Status: Abnormal   Collection Time: 09/02/21  2:48 AM  Result Value Ref Range   Sodium 131 (L) 135 - 145 mmol/L   Potassium 3.3 (L) 3.5 - 5.1 mmol/L   Chloride 93 (L) 98 - 111 mmol/L   CO2 27 22 - 32 mmol/L   Glucose, Bld 104 (H) 70 - 99 mg/dL    Comment: Glucose reference range applies only to samples taken after fasting for at least 8 hours.   BUN 21 8 - 23 mg/dL   Creatinine, Ser 1.18 0.61 - 1.24 mg/dL   Calcium 8.7 (L) 8.9 - 10.3 mg/dL   GFR, Estimated >60 >60 mL/min    Comment: (NOTE) Calculated using the CKD-EPI Creatinine Equation (2021)    Anion gap 11 5 - 15    Comment: Performed at [facility] [address], [place], [place] [address]  Vitamin B12     Status: None   Collection Time: 09/02/21  2:48 AM  Result Value Ref Range   Vitamin B-12 420 180 - 914 pg/mL    Comment: (NOTE) This assay is not validated for testing neonatal or myeloproliferative syndrome specimens for Vitamin B12 levels. Performed at [facility], [place] [address], [place],  [address]   D-dimer, quantitative     Status: Abnormal   Collection Time: 09/02/21  2:48 AM  Result Value Ref Range   D-Dimer, Quant 1.92 (H) 0.00  - 0.50 ug/mL-FEU    Comment: (NOTE) At the manufacturer cut-off value of 0.5 g/mL FEU, this assay has a negative predictive value of 95-100%.This assay is intended  for use in conjunction with a clinical pretest probability (PTP) assessment model to exclude pulmonary embolism (PE) and deep venous thrombosis (DVT) in outpatients suspected of PE or DVT. Results should be correlated with clinical presentation. Performed at [facility], [place] [address], [place], [place] [address]   Folate     Status: None   Collection Time: 09/02/21  2:48 AM  Result Value Ref Range   Folate 26.3 >5.9 ng/mL    Comment: Performed at [facility] [address] [place], [place] [address]    DG Chest 2 View  Result Date: 09/01/2021 CLINICAL DATA:  New hypoxia. EXAM: CHEST - 2 VIEW COMPARISON:  Chest x-ray 08/02/2021 FINDINGS: There is stable mild elevation of the right hemidiaphragm. There is no focal lung infiltrate, pleural effusion or pneumothorax. Cardiomediastinal silhouette is within normal limits and stable. Left shoulder arthroplasty is again seen. IMPRESSION: No active cardiopulmonary disease. Electronically Signed   By: Ronney Asters M.D.   On: 09/01/2021 19:35   DG Ribs Unilateral Left  Result Date: 09/02/2021 CLINICAL DATA:  Fall. EXAM: LEFT RIBS - 2 VIEW COMPARISON:  Chest x-ray 09/01/2021.  Chest CT 07/10/2021. FINDINGS: No fracture or other bone lesions are seen involving the ribs. Left shoulder arthroplasty is present. IMPRESSION: Negative. Electronically Signed   By: Ronney Asters M.D.   On: 09/02/2021 00:52   CT Angio Chest Pulmonary Embolism (PE) W or WO Contrast  Result Date: 09/02/2021 CLINICAL DATA:  88 year old with shortness of breath and wheezing. Pulmonary embolism suspected. EXAM: CT ANGIOGRAPHY CHEST WITH CONTRAST TECHNIQUE: Multidetector CT imaging of the chest was performed using the standard protocol during bolus administration of intravenous contrast. Multiplanar CT  image reconstructions and MIPs were obtained to evaluate the vascular anatomy. RADIATION DOSE REDUCTION: This exam was performed according to the departmental dose-optimization program which includes automated exposure control, adjustment of the mA and/or kV according to patient size and/or use of iterative reconstruction technique. CONTRAST:  13m OMNIPAQUE IOHEXOL 350 MG/ML SOLN COMPARISON:  Chest CTA 07/10/2021 FINDINGS: Cardiovascular: Suboptimal evaluation of the pulmonary arteries due to poor contrast opacification in the pulmonary arteries and motion artifact. No evidence for a large pulmonary embolism within the main pulmonary arteries. Limited evaluation of the lobar and segmental branches. Coronary artery calcifications. Ascending thoracic aorta measures up to 4.2 cm and stable. Heart size is normal. No significant pericardial effusion. Mediastinum/Nodes: Again noted are multiple small mediastinal lymph nodes. Overall, no significant lymph node enlargement in the mediastinum, hila or axillary regions. Lungs/Pleura: Centrilobular emphysema. Volume loss in the right lower lobe associated with the elevated right hemidiaphragm. Dependent densities in the posterior left upper lobe. No large areas of airspace disease in the lungs. No large pleural effusions. Upper Abdomen: Again noted is elevation of the right hemidiaphragm. No acute abnormality in the visualized upper abdomen. Musculoskeletal: Left shoulder replacement. Severe degenerative changes in the right shoulder. No acute osseous abnormality. Review of the MIP images confirms the above findings. IMPRESSION: 1. Suboptimal evaluation for pulmonary embolism due to technical limitations. No evidence for a large central pulmonary embolism as described. Limited evaluation of the lobar and more distal pulmonary artery branches. 2. Centrilobular emphysema with volume loss in the right lower lobe associated with the chronically elevated right hemidiaphragm.  Dependent densities in lungs but no significant new airspace disease. 3. Fusiform aneurysm of the ascending thoracic aorta measuring up to 4.2 cm. Recommend annual imaging followup by CTA or MRA. This recommendation follows 2010 ACCF/AHA/AATS/ACR/ASA/SCA/SCAI/SIR/STS/SVM Guidelines  for the Diagnosis and Management of Patients with Thoracic Aortic Disease. Circulation. 2010; 121: O032-Z224. Aortic aneurysm NOS (ICD10-I71.9) 4.  Aortic Atherosclerosis (ICD10-I70.0). Electronically Signed   By: Markus Daft M.D.   On: 09/02/2021 08:08   VAS [place] LOWER EXTREMITY VENOUS (DVT)  Result Date: 09/02/2021  Lower Venous DVT Study Patient Name:  TERRIS GERMANO  Date of Exam:   09/02/2021 Medical Rec #: [id number]          Accession #:    [id number] Date of Birth: 07/03/1932          Patient Gender: M Patient Age:   88 years Exam Location:  [facility] Procedure:      VAS [place] LOWER EXTREMITY VENOUS (DVT) Referring Phys: Wandra Feinstein RATHORE --------------------------------------------------------------------------------  Indications: Edema, and Swelling.  Comparison Study: 07/11/21 prior Performing Technologist: Archie Patten RVS  Examination Guidelines: A complete evaluation includes B-mode imaging, spectral Doppler, color Doppler, and power Doppler as needed of all accessible portions of each vessel. Bilateral testing is considered an integral part of a complete examination. Limited examinations for reoccurring indications may be performed as noted. The reflux portion of the exam is performed with the patient in reverse Trendelenburg.  +---------+---------------+---------+-----------+----------+--------------+ RIGHT    CompressibilityPhasicitySpontaneityPropertiesThrombus Aging +---------+---------------+---------+-----------+----------+--------------+ CFV      Full           Yes      Yes                                 +---------+---------------+---------+-----------+----------+--------------+ SFJ       Full                                                        +---------+---------------+---------+-----------+----------+--------------+ FV Prox  Full                                                        +---------+---------------+---------+-----------+----------+--------------+ FV Mid   Full                                                        +---------+---------------+---------+-----------+----------+--------------+ FV DistalFull                                                        +---------+---------------+---------+-----------+----------+--------------+ PFV      Full                                                        +---------+---------------+---------+-----------+----------+--------------+ POP      Full           Yes  Yes                                 +---------+---------------+---------+-----------+----------+--------------+ PTV      Full                                                        +---------+---------------+---------+-----------+----------+--------------+ PERO     Full           Yes      Yes                                 +---------+---------------+---------+-----------+----------+--------------+   +---------+---------------+---------+-----------+----------+-------------------+ LEFT     CompressibilityPhasicitySpontaneityPropertiesThrombus Aging      +---------+---------------+---------+-----------+----------+-------------------+ CFV      Full           Yes      Yes                                      +---------+---------------+---------+-----------+----------+-------------------+ SFJ      Full                                                             +---------+---------------+---------+-----------+----------+-------------------+ FV Prox  Full                                                             +---------+---------------+---------+-----------+----------+-------------------+ FV Mid    Full                                                             +---------+---------------+---------+-----------+----------+-------------------+ FV DistalFull                                                             +---------+---------------+---------+-----------+----------+-------------------+ PFV      Full                                                             +---------+---------------+---------+-----------+----------+-------------------+ POP      Full           Yes      Yes                                      +---------+---------------+---------+-----------+----------+-------------------+  PTV      Full                                                             +---------+---------------+---------+-----------+----------+-------------------+ PERO                                                  Not well visualized +---------+---------------+---------+-----------+----------+-------------------+     Summary: BILATERAL: - No evidence of deep vein thrombosis seen in the lower extremities, bilaterally. -No evidence of popliteal cyst, bilaterally.   *See table(s) above for measurements and observations.    Preliminary     Assessment/Plan: Patient presents with a necrotic skin flap left lower extremity wound.  We discussed a number of options but ultimately I recommended debridement of the necrotic skin here in the emergency room which should be able to be accomplished given his relative lack of sensation in that area.  We discussed risks and benefits and he is interested in moving forward.  Procedure note: The 6 x 6 cm area of necrotic skin was grasped with pickups and elevated and debrided with scissors.  The deep tissue look to have good perfusion.  This looked to be a partial-thickness wound with still some residual dermis in place that was viable.  Hemostasis was obtained with pressure and the wound was dressed with Xeroform and a compressive wrap.  Now the  patient has been debrided I would not recommend any further surgery at this time.  I do not believe he would benefit from any further debridement in the operating room.  He does have several risk factors for surgery.  Recommend following wound care team's recommendations in terms of dressings.  I am happy to see again if needed.  Upon discharge he can follow-up in the wound care clinic.  Please call with any further questions or concerns.  Cindra Presume 09/02/2021, 2:52 PM

## 2021-09-02 NOTE — ED Notes (Signed)
Pt off unit at this time

## 2021-09-02 NOTE — ED Notes (Signed)
Radiology at bedside at this time.

## 2021-09-02 NOTE — ED Notes (Signed)
MD at bedside debriding wound.

## 2021-09-02 NOTE — ED Notes (Signed)
Pt sitting in bed eating breakfast.

## 2021-09-02 NOTE — Progress Notes (Signed)
?PROGRESS NOTE ? ? ? ?[name]  WER:[id number] DOB: 1932/05/22 DOA: 09/01/2021 ?PCP: [facility]  ? ? ?Brief Narrative:  ?[name] is a 88 y.o. male with medical history significant of chronic diastolic CHF, hypertension, BPH, anxiety, bullous pemphigoid, asthma, hyperlipidemia, chronic hyponatremia presented to the hospital this time with generalized weakness and low oxygen.  He reported a fall 10 days back with pain over the left anterior lower ribs.  Patient also had left lower extremity tear and patient was given  doxycycline for leg infection recently.  Patient now reported increasing generalized weakness to the point that he has been using wheelchair at home.  Of note, patient was admitted last month for hypotension, AKI, right lower extremity cellulitis, and UTI.  On this presentation, patient had mild leukocytosis at 15.8, hemoglobin of 8.9, elevated MCV,  Sodium 128 (baseline low 130s), potassium 3.5, chloride 94, bicarb 26, BUN 25, creatinine 1.2, glucose 102.  Urine dipstick showing negative nitrite, trace leukocytes.  BNP 342.  High-sensitivity troponin negative x2.  COVID and influenza PCR negative.  Chest x-ray showing no active cardiopulmonary disease.  Patient was then admitted hospital for further evaluation and treatment ? ?Assessment and plan ? ?Cellulitis of lower legs, left leg skin tear with flap. ?Patient was recently admitted last month for lower extremity cellulitis and was treated with antibiotic.  Again had superficial skin tear few days back.  Patient with erythema and leukocytosis at this time.  On vancomycin.  Wound care has been consulted for dressing needs.  Wound care recommending general surgery consultation for flap removal.  We will consult general surgery. ? ?Acute on chronic diastolic CHF ?With gross bilateral lower extremity edema.  2D echocardiogram from February 2023 with normal systolic function and grade 2 diastolic dysfunction.  On  Lasix 40 IV twice daily.  Continue strict intake and output charting, daily weights and low-salt diet.   ?  ?Bilateral lower extremity edema ?Likely secondary to chronic venous stasis and decompensated heart failure.  Check lower extremity ultrasound for DVT.   ?  ?Hypoxia ?Provide supplemental oxygen at 2 L/min.  Chest x-ray without acute findings.  COVID and influenza negative.  Troponins negative. ?  ?Rib pain/ Abdominal bruising ?Status post fall.  Likely hypoxia from decreased chest wall excursion.  Continue pain management with tramadol gabapentin.  DVT prophylaxis on hold. ?  ?Abnormal urinalysis ?No urinary symptoms.  We will hold off with antibiotic for now.  Was treated for UTI last month.   ? ?Generalized weakness ?We will get PT OT evaluation.  Might need rehabilitation. ? ?Chronic macrocytic anemia ?Hemoglobin 8.9 presentation., baseline 11-12.  Continue folic acid from home.  Folic acid level at 26, vitamin B12 420. ? ?,Acute on chronic hyponatremia ?Likely due to hypervolemia.  Sodium 128 on presentation., baseline in the low 130s. ?Continue IV diuresis.  Sodium level has improved to 131 this morning. ?  ?Hypertension ?Continue clonidine and metoprolol blood pressure is controlled at this time. ?  ?BPH ?-Continue finasteride ?  ?Anxiety ?-Continue home Ativan as needed ?  ?Bullous pemphigoid ?-Hold methotrexate given concern for active infection ?  ?Asthma ?Continue Singulair and albuterol  ? ? DVT prophylaxis: none ? ? ?Code Status:   ?  Code Status: DNR ? ?Disposition: Lives by himself at home and is independent.  We will get PT evaluation. ? ?Status is: Inpatient ?Remains inpatient appropriate because: IV antibiotic, wound care, diuretics ? ? Family Communication:  ?Communicated with the  patient at bedside.  Patient does have a friend from [place] who visits him. ? ?Consultants:  ?Wound care ? ?Procedures:  ?None ? ?Antimicrobials:  ?Vancomycin IV ? ?Anti-infectives (From admission, onward)  ? ?  Start     Dose/Rate Route Frequency Ordered Stop  ? 09/02/21 2100  vancomycin (VANCOREADY) IVPB 1250 mg/250 mL  Status:  Discontinued       ? 1,250 mg ?166.7 mL/hr over 90 Minutes Intravenous  Once 09/01/21 2201 09/02/21 0022  ? 09/02/21 2000  vancomycin (VANCOREADY) IVPB 1250 mg/250 mL       ? 1,250 mg ?166.7 mL/hr over 90 Minutes Intravenous Every 24 hours 09/02/21 0025    ? 09/01/21 2115  vancomycin (VANCOREADY) IVPB 2000 mg/400 mL       ? 2,000 mg ?200 mL/hr over 120 Minutes Intravenous  Once 09/01/21 2103 09/02/21 0016  ? ?  ? ?Subjective: ?Today, patient was seen and examined at bedside.  Patient denies any nausea vomiting fever chills or rigor.  Has generalized weakness.  Denies overt pain. ? ?Objective: ?Vitals:  ? 09/02/21 0500 09/02/21 0600 09/02/21 0925 09/02/21 0945  ?BP: (!) 159/86 134/69 (!) 145/0 (!) 145/50  ?Pulse: 69 64 75 72  ?Resp: '17 15  20  '$ ?Temp:      ?TempSrc:      ?SpO2: 100% 100%  94%  ?Weight:      ?Height:      ? ? ?Intake/Output Summary (Last 24 hours) at 09/02/2021 1030 ?Last data filed at 09/02/2021 0016 ?Gross per 24 hour  ?Intake 400.18 ml  ?Output --  ?Net 400.18 ml  ? ?Filed Weights  ? 09/01/21 1803  ?Weight: 115.2 kg  ? ? ?Physical Examination: ? ?General:  Average built, not in obvious distress, elderly male, alert awake and communicative. ?HENT:   No scleral pallor or icterus noted. Oral mucosa is moist.  ?Chest:  Clear breath sounds.  Diminished breath sounds bilaterally. No crackles or wheezes.  Tenderness over the left chest wall on palpation. ?CVS: S1 &S2 heard. No murmur.  Regular rate and rhythm. ?Abdomen: Soft, nontender, nondistended.  Bowel sounds are heard.   ?Extremities: No cyanosis, clubbing with bilateral lower extremity pitting edema and left leg with skin tear.  Peripheral pulses are palpable. ?Psych: Alert, awake and oriented, normal mood ?CNS:  No cranial nerve deficits.  Power equal in all extremities.   ?Skin: Warm and dry.  Bruises noted over the lower chest  wall. ? ?Data Reviewed:  ? ?CBC: ?Recent Labs  ?Lab 09/01/21 ?1905 09/02/21 ?0248  ?WBC 15.8* 15.4*  ?NEUTROABS 13.4*  --   ?HGB 8.9* 9.7*  ?HCT 27.1* 29.8*  ?MCV 104.6* 106.4*  ?PLT 155 155  ? ? ?Basic Metabolic Panel: ?Recent Labs  ?Lab 09/01/21 ?1905 09/02/21 ?0248  ?NA 128* 131*  ?K 3.5 3.3*  ?CL 94* 93*  ?CO2 26 27  ?GLUCOSE 102* 104*  ?BUN 25* 21  ?CREATININE 1.21 1.18  ?CALCIUM 8.5* 8.7*  ? ? ?Liver Function Tests: ?No results for input(s): AST, ALT, ALKPHOS, BILITOT, PROT, ALBUMIN in the last 168 hours. ? ? ?Radiology Studies: ?DG Chest 2 View ? ?Result Date: 09/01/2021 ?CLINICAL DATA:  New hypoxia. EXAM: CHEST - 2 VIEW COMPARISON:  Chest x-ray 08/02/2021 FINDINGS: There is stable mild elevation of the right hemidiaphragm. There is no focal lung infiltrate, pleural effusion or pneumothorax. Cardiomediastinal silhouette is within normal limits and stable. Left shoulder arthroplasty is again seen. IMPRESSION: No active cardiopulmonary disease. Electronically Signed   By:  Ronney Asters M.D.   On: 09/01/2021 19:35  ? ?DG Ribs Unilateral Left ? ?Result Date: 09/02/2021 ?CLINICAL DATA:  Fall. EXAM: LEFT RIBS - 2 VIEW COMPARISON:  Chest x-ray 09/01/2021.  Chest CT 07/10/2021. FINDINGS: No fracture or other bone lesions are seen involving the ribs. Left shoulder arthroplasty is present. IMPRESSION: Negative. Electronically Signed   By: Ronney Asters M.D.   On: 09/02/2021 00:52  ? ?CT Angio Chest Pulmonary Embolism (PE) W or WO Contrast ? ?Result Date: 09/02/2021 ?CLINICAL DATA:  88 year old with shortness of breath and wheezing. Pulmonary embolism suspected. EXAM: CT ANGIOGRAPHY CHEST WITH CONTRAST TECHNIQUE: Multidetector CT imaging of the chest was performed using the standard protocol during bolus administration of intravenous contrast. Multiplanar CT image reconstructions and MIPs were obtained to evaluate the vascular anatomy. RADIATION DOSE REDUCTION: This exam was performed according to the departmental  dose-optimization program which includes automated exposure control, adjustment of the mA and/or kV according to patient size and/or use of iterative reconstruction technique. CONTRAST:  41m OMNIPAQUE IOHEXOL 350

## 2021-09-02 NOTE — ED Notes (Signed)
Wound care RN at bedside  

## 2021-09-02 NOTE — Plan of Care (Signed)
  Problem: Education: Goal: Knowledge of General Education information will improve Description: Including pain rating scale, medication(s)/side effects and non-pharmacologic comfort measures Outcome: Progressing   Problem: Clinical Measurements: Goal: Ability to maintain clinical measurements within normal limits will improve Outcome: Progressing   

## 2021-09-02 NOTE — ED Notes (Signed)
Pt transported to radiology at this time °

## 2021-09-02 NOTE — Hospital Course (Addendum)
[name] is a 88 y.o. male with medical history significant of chronic diastolic CHF, hypertension, BPH, anxiety, bullous pemphigoid, asthma, hyperlipidemia, chronic hyponatremia presented to the hospital this time with generalized weakness and low oxygen.  He reported a fall 10 days back with pain over the left anterior lower ribs.  Patient also had left lower extremity tear and patient was given  doxycycline for leg infection recently.  Patient now reported increasing generalized weakness to the point that he has been using wheelchair at home.  Of note, patient was admitted last month for hypotension, AKI, right lower extremity cellulitis, and UTI.  On this presentation, patient had mild leukocytosis at 15.8, hemoglobin of 8.9, elevated MCV,  Sodium 128 (baseline low 130s), potassium 3.5, chloride 94, bicarb 26, BUN 25, creatinine 1.2, glucose 102.  Urine dipstick showing negative nitrite, trace leukocytes.  BNP 342.  High-sensitivity troponin negative x2.  COVID and influenza PCR negative.  Chest x-ray showed no active cardiopulmonary disease.  Patient was then admitted to the hospital for further evaluation and treatment. ? ?Assessment and plan ? ?Principal Problem: ?  Cellulitis ?Active Problems: ?  Hypertension ?  BPH (benign prostatic hyperplasia) ?  Asthma, chronic ?  Hyponatremia ?  Bullous pemphigoid ?  Acute on chronic diastolic CHF (congestive heart failure) ([place]) ?  Hypoxia ?  Hypokalemia ?  ?Cellulitis of lower legs, left leg skin tear with flap. ?Patient received IV vancomycin.  Patient was seen by wound care and plastic surgery Dr. Claudia Desanctis.  He underwent debridement at bedside by plastic surgery Dr. Claudia Desanctis.  Please continue wound dressing on discharge.  We will continue oral doxycycline for next 3 days to complete the course of antibiotic ? ?Acute on chronic diastolic CHF ?With gross bilateral lower extremity edema on presentation.  Patient has significantly diuresed with IV Lasix twice a day  during hospitalization.  Edema has significantly improved..  2D echocardiogram from February 2023 with normal systolic function and grade 2 diastolic dysfunction.  .  Latest creatinine at 1.02.  Would recommend daily weights, low-salt diet, fluid restriction 1500 MLS per day on discharge.  Patient will be resumed on home Lasix regimen with Lasix orally 40 twice daily.  Patient was negative balance for 14 L prior to discharge. ? ?Hypokalemia.  Improved after replacement.  Patient prior to discharge was 4.1.  Will need monitoring in the next visit. ? ?Bilateral lower extremity edema ?Likely secondary to chronic venous stasis and decompensated heart failure.  Significantly improved after diuresis.  Bilateral lower extremity ultrasound was negative for DVT.  We will change this to oral Lasix on discharge. ? ?Hypoxia ? Chest x-ray without acute findings.  COVID and influenza negative.  Troponins negative.   Looks like patient was using as needed oxygen 1 L at home.  ?  ?Rib pain/ Abdominal bruising ?Status post fall.  Continue pain management with tramadol, gabapentin.  Denies active chest pain at this time. ?  ?Abnormal urinalysis ?No urinary symptoms.  No indication for antibiotic.  Was recently treated with antibiotic ? ?Chronic macrocytic anemia ?Hemoglobin 8.9 on presentation., baseline 11-12.  Continue folic acid from home.  Folic acid level at 26, vitamin B12 420.  Latest hemoglobin of 9.7 ? ?,Acute on chronic hyponatremia ?Likely due to hypervolemia.  Sodium 128 on presentation.,  Sodium level has improved to 132 at this time.  Will need monitoring of BMP as outpatient on diuretics. ? ?Hypertension ?Continue clonidine and metoprolol ?  ?BPH ?-Continue finasteride ?  ?Anxiety ?-  Continue home Ativan as needed ?  ?Bullous pemphigoid ?Resume methotrexate on discharge. ?  ?Asthma ?Continue Singulair and albuterol. ? ?Generalized weakness, deconditioning, debility ?Physical therapy has recommended skilled nursing  facility placement at this time.  Patient lives by himself at home. ?

## 2021-09-03 DIAGNOSIS — L03119 Cellulitis of unspecified part of limb: Secondary | ICD-10-CM | POA: Diagnosis not present

## 2021-09-03 DIAGNOSIS — I5033 Acute on chronic diastolic (congestive) heart failure: Secondary | ICD-10-CM | POA: Diagnosis not present

## 2021-09-03 DIAGNOSIS — J45909 Unspecified asthma, uncomplicated: Secondary | ICD-10-CM | POA: Diagnosis not present

## 2021-09-03 DIAGNOSIS — L120 Bullous pemphigoid: Secondary | ICD-10-CM | POA: Diagnosis not present

## 2021-09-03 DIAGNOSIS — E876 Hypokalemia: Secondary | ICD-10-CM

## 2021-09-03 LAB — BASIC METABOLIC PANEL
Anion gap: 8 (ref 5–15)
BUN: 16 mg/dL (ref 8–23)
CO2: 30 mmol/L (ref 22–32)
Calcium: 8.3 mg/dL — ABNORMAL LOW (ref 8.9–10.3)
Chloride: 95 mmol/L — ABNORMAL LOW (ref 98–111)
Creatinine, Ser: 0.87 mg/dL (ref 0.61–1.24)
GFR, Estimated: >60 mL/min (ref >60)
Glucose, Bld: 109 mg/dL — ABNORMAL HIGH (ref 70–99)
Potassium: 3 mmol/L — ABNORMAL LOW (ref 3.5–5.1)
Sodium: 133 mmol/L — ABNORMAL LOW (ref 135–145)

## 2021-09-03 LAB — CBC
HCT: 24.6 % — ABNORMAL LOW (ref 39.0–52.0)
Hemoglobin: 8.5 g/dL — ABNORMAL LOW (ref 13.0–17.0)
MCH: 35.7 pg — ABNORMAL HIGH (ref 26.0–34.0)
MCHC: 34.6 g/dL (ref 30.0–36.0)
MCV: 103.4 fL — ABNORMAL HIGH (ref 80.0–100.0)
Platelets: 134 10*3/uL — ABNORMAL LOW (ref 150–400)
RBC: 2.38 MIL/uL — ABNORMAL LOW (ref 4.22–5.81)
RDW: 15.3 % (ref 11.5–15.5)
WBC: 5.2 10*3/uL (ref 4.0–10.5)
nRBC: 0 % (ref 0.0–0.2)

## 2021-09-03 MED ORDER — MAGNESIUM OXIDE -MG SUPPLEMENT 400 (240 MG) MG PO TABS
400.0000 mg | ORAL_TABLET | Freq: Two times a day (BID) | ORAL | Status: DC
  Administered 2021-09-03 – 2021-09-08 (×11): 400 mg via ORAL
  Filled 2021-09-03 (×11): qty 1

## 2021-09-03 MED ORDER — POTASSIUM CHLORIDE CRYS ER 20 MEQ PO TBCR
40.0000 meq | EXTENDED_RELEASE_TABLET | Freq: Once | ORAL | Status: AC
  Administered 2021-09-03: 40 meq via ORAL
  Filled 2021-09-03: qty 2

## 2021-09-03 MED ORDER — VITAMIN D 25 MCG (1000 UNIT) PO TABS
5000.0000 [IU] | ORAL_TABLET | Freq: Every day | ORAL | Status: DC
  Administered 2021-09-03 – 2021-09-08 (×6): 5000 [IU] via ORAL
  Filled 2021-09-03 (×6): qty 5

## 2021-09-03 NOTE — Progress Notes (Signed)
SATURATION QUALIFICATIONS: (This note is used to comply with regulatory documentation for home oxygen) ? ?Patient Saturations on Room Air at Rest = 87% ? ?Patient Saturations on Room Air while Ambulating = Not tested ? ?Patient Saturations on 2 Liters of oxygen while Ambulating = 91% ? ?Please briefly explain why patient needs home oxygen: Pt unable to maintain O2 sats >87% while at rest on RA.  ? ?Rolinda Roan, PT, DPT ?Acute Rehabilitation Services ?Secure Chat Preferred ?Office: [phone]  ?

## 2021-09-03 NOTE — Evaluation (Signed)
Physical Therapy Evaluation ? ?Patient Details ?Name: [name] ?MRN: [id number] ?DOB: 09/09/1932 ?Today's Date: 09/03/2021 ? ?History of Present Illness ? Pt is an 88 y/o male who presents with shortness of breath and hypoxia. PMH significant for fall 10 days ago with L anterior lower rib pain and LLE skin tear, hypotension, AKI, R LE cellulitis, dCHF, neuropathy, vascular insufficiency. ?  ?Clinical Impression ? Pt admitted with above diagnosis. Pt currently with functional limitations due to the deficits listed below (see PT Problem List). At the time of PT eval pt was able to perform transfers with up to mod assist and RW for support. Pt was unable to progress to gait training due to fatigue/decreased tolerance for functional activity. Recommend continued rehab at the SNF level to maximize functional independence and safety prior to return home with family support. Pt will benefit from skilled PT to increase their independence and safety with mobility to allow discharge to the venue listed below.      ?   ? ?Recommendations for follow up therapy are one component of a multi-disciplinary discharge planning process, led by the attending physician.  Recommendations may be updated based on patient status, additional functional criteria and insurance authorization. ? ?Follow Up Recommendations Skilled nursing-short term rehab (<3 hours/day) ? ?  ?Assistance Recommended at Discharge Frequent or constant Supervision/Assistance  ?Patient can return home with the following ? A lot of help with walking and/or transfers;Assistance with cooking/housework;Assist for transportation;Help with stairs or ramp for entrance ? ?  ?Equipment Recommendations None recommended by PT  ?Recommendations for Other Services ?    ?  ?Functional Status Assessment Patient has had a recent decline in their functional status and demonstrates the ability to make significant improvements in function in a reasonable and predictable amount of  time.  ? ?  ?Precautions / Restrictions Precautions ?Precautions: Fall ?Precaution Comments: Watch O2 ?Restrictions ?Weight Bearing Restrictions: No  ? ?  ? ?Mobility ? Bed Mobility ?Overal bed mobility: Needs Assistance ?Bed Mobility: Rolling, Sidelying to Sit ?Rolling: Min assist ?Sidelying to sit: Mod assist ?  ?  ?  ?General bed mobility comments: Pt able to initiate and therapist assisted with completion of log roll. Pt reports "bad" R shoulder and had difficulty propping up on it. Heavy mod assist for sidelying to sit. ?  ? ?Transfers ?Overall transfer level: Needs assistance ?Equipment used: Rolling walker (2 wheels) ?Transfers: Sit to/from Stand, Bed to chair/wheelchair/BSC ?Sit to Stand: Mod assist ?  ?Step pivot transfers: Min assist ?  ?  ?  ?General transfer comment: Posterior lean and difficulty with anterior weight shift to get COM over BOS. Pt required assist with placing feet flat on the floor - he has difficulty with proprioception 2? neuropathy. Increased time to transition around to recliner with increased cues to align with center of chair before initiating stand>sit. ?  ? ?Ambulation/Gait ?  ?  ?  ?  ?  ?  ?  ?General Gait Details: Unable to progress at this time. ? ?Stairs ?  ?  ?  ?  ?  ? ?Wheelchair Mobility ?  ? ?Modified Rankin (Stroke Patients Only) ?  ? ?  ? ?Balance Overall balance assessment: Needs assistance ?Sitting-balance support: Feet supported, No upper extremity supported ?Sitting balance-Leahy Scale: Poor ?Sitting balance - Comments: posterior and R lateral lean. Increased time required for pt to gain/maintain sitting balance. ?Postural control: Posterior lean, Right lateral lean ?Standing balance support: Bilateral upper extremity supported, Reliant on assistive device  for balance ?Standing balance-Leahy Scale: Poor ?  ?  ?  ?  ?  ?  ?  ?  ?  ?  ?  ?  ?   ? ? ? ?Pertinent Vitals/Pain Pain Assessment ?Pain Assessment: No/denies pain  ? ? ?Home Living Family/patient expects to be  discharged to:: Private residence ?Living Arrangements: Alone ?Available Help at Discharge: Family;Available 24 hours/day ?Type of Home: House ?Home Access: Stairs to enter ?Entrance Stairs-Rails: None ?Entrance Stairs-Number of Steps: 3 ?  ?Home Layout: One level ?Home Equipment: Conservation officer, nature (2 wheels);Rollator (4 wheels);Wheelchair - manual;Shower seat - built in;BSC/3in1;Grab bars - tub/shower ?   ?  ?Prior Function Prior Level of Function : Needs assist ?  ?  ?  ?Physical Assist : Mobility (physical);ADLs (physical) ?Mobility (physical): Bed mobility;Transfers;Gait;Stairs ?ADLs (physical): Grooming;Bathing;Dressing;Toileting ?Mobility Comments: Using the Rollator for mobility. Son assisting for transfers on/off the commode. ?ADLs Comments: Not taking many showers as son couldnt get him in the shower safely. ?  ? ? ?Hand Dominance  ? Dominant Hand: Right ? ?  ?Extremity/Trunk Assessment  ? Upper Extremity Assessment ?Upper Extremity Assessment: RUE deficits/detail ?RUE Deficits / Details: Pt reports "bad" right shoulder. ?  ? ?Lower Extremity Assessment ?Lower Extremity Assessment: Generalized weakness;LLE deficits/detail ?LLE Deficits / Details: Noted skin tear on LLE. Pt with baseline neuropathy. ?LLE:  (foot and lower leg wrapped with ACE) ?  ? ?Cervical / Trunk Assessment ?Cervical / Trunk Assessment: Other exceptions ?Cervical / Trunk Exceptions: Forward head posture with rounded shoulders  ?Communication  ? Communication: No difficulties  ?Cognition Arousal/Alertness: Awake/alert ?Behavior During Therapy: Flat affect (Mild) ?Overall Cognitive Status: Within Functional Limits for tasks assessed ?  ?  ?  ?  ?  ?  ?  ?  ?  ?  ?  ?  ?  ?  ?  ?  ?  ?  ?  ? ?  ?General Comments   ? ?  ?Exercises    ? ?Assessment/Plan  ?  ?PT Assessment Patient needs continued PT services  ?PT Problem List Decreased strength;Decreased activity tolerance;Decreased balance;Decreased mobility;Decreased knowledge of use of  DME;Decreased safety awareness;Decreased knowledge of precautions;Cardiopulmonary status limiting activity ? ?   ?  ?PT Treatment Interventions DME instruction;Gait training;Stair training;Functional mobility training;Therapeutic activities;Therapeutic exercise;Balance training;Patient/family education   ? ?PT Goals (Current goals can be found in the Care Plan section)  ?Acute Rehab PT Goals ?Patient Stated Goal: Return to PLOF and eventually get home ?PT Goal Formulation: With patient/family ?Time For Goal Achievement: 09/17/21 ?Potential to Achieve Goals: Good ? ?  ?Frequency Min 3X/week ?  ? ? ?Co-evaluation   ?  ?  ?  ?  ? ? ?  ?AM-PAC PT "6 Clicks" Mobility  ?Outcome Measure Help needed turning from your back to your side while in a flat bed without using bedrails?: A Little ?Help needed moving from lying on your back to sitting on the side of a flat bed without using bedrails?: A Lot ?Help needed moving to and from a bed to a chair (including a wheelchair)?: A Little ?Help needed standing up from a chair using your arms (e.g., wheelchair or bedside chair)?: A Lot ?Help needed to walk in hospital room?: Total ?Help needed climbing 3-5 steps with a railing? : Total ?6 Click Score: 12 ? ?  ?End of Session Equipment Utilized During Treatment: Gait belt;Oxygen ?Activity Tolerance: Patient limited by fatigue ?Patient left: in chair;with call bell/phone within reach;with chair alarm set;with family/visitor present ?  Nurse Communication: Mobility status;Need for lift equipment Charlaine Dalton) ?PT Visit Diagnosis: Unsteadiness on feet (R26.81);Difficulty in walking, not elsewhere classified (R26.2) ?  ? ?Time: 6047-9987 ?PT Time Calculation (min) (ACUTE ONLY): 40 min ? ? ?Charges:   PT Evaluation ?$PT Eval Moderate Complexity: 1 Mod ?PT Treatments ?$Gait Training: 23-37 mins ?  ?   ? ? ?Rolinda Roan, PT, DPT ?Acute Rehabilitation Services ?Secure Chat Preferred ?Office: [phone]  ? ?Thelma Comp ?09/03/2021, 12:20 PM ? ?

## 2021-09-03 NOTE — Plan of Care (Signed)
?  Problem: Education: ?Goal: Knowledge of General Education information will improve ?Description: Including pain rating scale, medication(s)/side effects and non-pharmacologic comfort measures ?09/03/2021 0801 by Santa Lighter, RN ?Outcome: Progressing ?09/02/2021 1832 by Santa Lighter, RN ?Outcome: Progressing ?  ?Problem: Health Behavior/Discharge Planning: ?Goal: Ability to manage health-related needs will improve ?09/03/2021 0801 by Santa Lighter, RN ?Outcome: Progressing ?09/02/2021 1832 by Santa Lighter, RN ?Outcome: Progressing ?  ?Problem: Clinical Measurements: ?Goal: Ability to maintain clinical measurements within normal limits will improve ?09/03/2021 0801 by Santa Lighter, RN ?Outcome: Progressing ?09/02/2021 1832 by Santa Lighter, RN ?Outcome: Progressing ?  ?

## 2021-09-03 NOTE — Progress Notes (Addendum)
?PROGRESS NOTE ? ? ? ?[name]  ACZ:[id number] DOB: [birth year] DOA: 09/01/2021 ?PCP: [facility]  ? ? ?Brief Narrative:  ?[name] is a 88 y.o. male with medical history significant of chronic diastolic CHF, hypertension, BPH, anxiety, bullous pemphigoid, asthma, hyperlipidemia, chronic hyponatremia presented to the hospital this time with generalized weakness and low oxygen.  He reported a fall 10 days back with pain over the left anterior lower ribs.  Patient also had left lower extremity tear and patient was given  doxycycline for leg infection recently.  Patient now reported increasing generalized weakness to the point that he has been using wheelchair at home.  Of note, patient was admitted last month for hypotension, AKI, right lower extremity cellulitis, and UTI.  On this presentation, patient had mild leukocytosis at 15.8, hemoglobin of 8.9, elevated MCV,  Sodium 128 (baseline low 130s), potassium 3.5, chloride 94, bicarb 26, BUN 25, creatinine 1.2, glucose 102.  Urine dipstick showing negative nitrite, trace leukocytes.  BNP 342.  High-sensitivity troponin negative x2.  COVID and influenza PCR negative.  Chest x-ray showing no active cardiopulmonary disease.  Patient was then admitted hospital for further evaluation and treatment ? ?Assessment and plan ? ?Principal Problem: ?  Cellulitis ?Active Problems: ?  Hypertension ?  BPH (benign prostatic hyperplasia) ?  Asthma, chronic ?  Hyponatremia ?  Bullous pemphigoid ?  Acute on chronic diastolic CHF (congestive heart failure) ([place]) ?  Hypoxia ?  Hypokalemia ?  ?Cellulitis of lower legs, left leg skin tear with flap. ?Patient was recently admitted last month for lower extremity cellulitis and was treated with antibiotic.  Again had superficial skin tear few days back.  Patient presented with erythema and leukocytosis at this time.  On IV vancomycin.  Wound care was consulted for dressing needs and recommended surgical  intervention for flap removal.  Patient was seen by Dr. Claudia Desanctis plastic surgery yesterday and bedside debridement was done.  At this time patient will continue wound care.  Continue antibiotic.   ? ?Acute on chronic diastolic CHF ?With gross bilateral lower extremity edema.  2D echocardiogram from February 2023 with normal systolic function and grade 2 diastolic dysfunction.  On Lasix 40 mg IV twice daily.  Continue strict intake and output charting, daily weights and low-salt diet.  Patient is negative balance for 1849 ml at this time.  We will continue to monitor ? ?Hypokalemia..  We will replace orally.  Check levels in AM.  Continue to monitor while on diuretics. ?  ?Bilateral lower extremity edema ?Likely secondary to chronic venous stasis and decompensated heart failure.  Bilateral lower extremity ultrasound was negative for DVT.  Edema has improved. ?  ?Hypoxia ?Requiring supplemental oxygen at 2 L/min.  Chest x-ray without acute findings.  COVID and influenza negative.  Troponins negative.  Continue to wean as able as we diurese, pain controlled.  Patient has qualified for oxygen at this time.  Looks like patient was using as needed oxygen 1 L at home. ?  ?Rib pain/ Abdominal bruising ?Status post fall.  Likely hypoxia from decreased chest wall excursion.  Continue pain management with tramadol gabapentin.  DVT prophylaxis on hold. ?  ?Abnormal urinalysis ?No urinary symptoms.  We will hold off with antibiotic for now.  Was treated for UTI last month.   ? ?Generalized weakness ?Physical therapy has been consulted at this time and recommended skilled nursing facility placement. ? ?Chronic macrocytic anemia ?Hemoglobin 8.9 presentation., baseline 11-12.  Continue  folic acid from home.  Folic acid level at 26, vitamin B12 420.  Latest hemoglobin of 8.5. ? ?,Acute on chronic hyponatremia ?Likely due to hypervolemia.  Sodium 128 on presentation., baseline in the low 130s. Continue IV diuresis.  Sodium level has  improved to 133 this morning. ?  ?Hypertension ?Continue clonidine and metoprolol, blood pressure is controlled at this time.  Continue with diuresis. ?  ?BPH ?-Continue finasteride ?  ?Anxiety ?-Continue home Ativan as needed ?  ?Bullous pemphigoid ?-Hold methotrexate given concern for active infection ?  ?Asthma ?Continue Singulair and albuterol  ? ? DVT prophylaxis:  ?none due to bilateral lower extremity edema/wound and bruising over the abdominal wall.   ? ?Code Status:   ?  Code Status: DNR ? ?Disposition: ? Lives by himself at home and is independent.  PT recommends skilled nursing facility placement at this time. ? ?Status is: Inpatient ? ?Remains inpatient appropriate because: IV antibiotic, wound care, diuretics, need for rehabilitation. ? ? Family Communication:  ?Communicated with patient's friend on the phone and updated him about the clinical condition of the patient.   ? ?Consultants:  ?Wound care ? ?Procedures:  ?None ? ?Antimicrobials:  ?Vancomycin IV ? ?Anti-infectives (From admission, onward)  ? ? Start     Dose/Rate Route Frequency Ordered Stop  ? 09/02/21 2100  vancomycin (VANCOREADY) IVPB 1250 mg/250 mL  Status:  Discontinued       ? 1,250 mg ?166.7 mL/hr over 90 Minutes Intravenous  Once 09/01/21 2201 09/02/21 0022  ? 09/02/21 2000  vancomycin (VANCOREADY) IVPB 1250 mg/250 mL       ? 1,250 mg ?166.7 mL/hr over 90 Minutes Intravenous Every 24 hours 09/02/21 0025    ? 09/01/21 2115  vancomycin (VANCOREADY) IVPB 2000 mg/400 mL       ? 2,000 mg ?200 mL/hr over 120 Minutes Intravenous  Once 09/01/21 2103 09/02/21 0016  ? ?  ? ?Subjective: ? ?Today, patient was seen and examined at bedside.  Denies any nausea vomiting fever chills or rigor.  Has generalized weakness.  Denies overt pain.  Denies dyspnea or chest pain.  Leg swelling has improved. ? ?Objective: ?Vitals:  ? 09/02/21 2233 09/03/21 0500 09/03/21 0521 09/03/21 0731  ?BP: 116/60  (!) 144/73 (!) 170/95  ?Pulse: 60  64 74  ?Resp: '17  17 17   '$ ?Temp: 98 ?F (36.7 ?C)  98.2 ?F (36.8 ?C) 97.6 ?F (36.4 ?C)  ?TempSrc: Oral  Oral Oral  ?SpO2: 97%  95% 96%  ?Weight:  116.9 kg    ?Height:      ? ? ?Intake/Output Summary (Last 24 hours) at 09/03/2021 1337 ?Last data filed at 09/03/2021 1104 ?Gross per 24 hour  ?Intake 250 ml  ?Output 2500 ml  ?Net -2250 ml  ? ?Filed Weights  ? 09/01/21 1803 09/03/21 0500  ?Weight: 115.2 kg 116.9 kg  ? ? ?Physical Examination: ? ?General:  Average built, not in obvious distress, elderly male, deconditioned, alert awake and communicative ?HENT:   No scleral pallor or icterus noted. Oral mucosa is moist.  ?Chest:   Diminished breath sounds bilaterally.  Bruise noted over the lower chest wall. ?CVS: S1 &S2 heard. No murmur.  Regular rate and rhythm. ?Abdomen: Soft, nontender, nondistended.  Bowel sounds are heard.   ?Extremities: No cyanosis, clubbing with bilateral pitting edema, left leg with dressing.  Peripheral pulses are palpable. ?Psych: Alert, awake and oriented, normal mood ?CNS:  No cranial nerve deficits.  Power equal in all extremities.   ?  Skin: Warm and dry.  Bruise over the left chest wall, legs with edema and dressing on the left ? ?Data Reviewed:  ? ?CBC: ?Recent Labs  ?Lab 09/01/21 ?1905 09/02/21 ?0248 09/03/21 ?0827  ?WBC 15.8* 15.4* 5.2  ?NEUTROABS 13.4*  --   --   ?HGB 8.9* 9.7* 8.5*  ?HCT 27.1* 29.8* 24.6*  ?MCV 104.6* 106.4* 103.4*  ?PLT 155 155 134*  ? ? ?Basic Metabolic Panel: ?Recent Labs  ?Lab 09/01/21 ?1905 09/02/21 ?0248 09/02/21 ?0932 09/03/21 ?0827  ?NA 128* 131*  --  133*  ?K 3.5 3.3*  --  3.0*  ?CL 94* 93*  --  95*  ?CO2 26 27  --  30  ?GLUCOSE 102* 104*  --  109*  ?BUN 25* 21  --  16  ?CREATININE 1.21 1.18  --  0.87  ?CALCIUM 8.5* 8.7*  --  8.3*  ?MG  --   --  1.7  --   ? ? ?Liver Function Tests: ?No results for input(s): AST, ALT, ALKPHOS, BILITOT, PROT, ALBUMIN in the last 168 hours. ? ? ?Radiology Studies: ?DG Chest 2 View ? ?Result Date: 09/01/2021 ?CLINICAL DATA:  New hypoxia. EXAM: CHEST - 2 VIEW  COMPARISON:  Chest x-ray 08/02/2021 FINDINGS: There is stable mild elevation of the right hemidiaphragm. There is no focal lung infiltrate, pleural effusion or pneumothorax. Cardiomediastinal silhouette is wit

## 2021-09-03 NOTE — Progress Notes (Signed)
Patient refused Q2 turns. He is able to move himself in the bed and sit on the side of bed, and can transfer to [facility]. ?

## 2021-09-03 NOTE — TOC Initial Note (Signed)
Transition of Care (TOC) - Initial/Assessment Note  ? ? ?Patient Details  ?Name: [name] ?MRN: [id number] ?Date of Birth: 07/05/1932 ? ?Transition of Care (TOC) CM/SW Contact:    ?Emeterio Reeve, LCSW ?Phone Number: ?09/03/2021, 3:45 PM ? ?Clinical Narrative:                 ? ?CSW received SNF consult. CSW met with pt at bedside. CSW introduced self and explained role at the hospital. Pt reports that PTA Pt states he lives at home alone. Pt reports he was independent with ADL's.  ? ?CSW reviewed PT/OT recommendations for SNF. Pt reports he is fine oing to SNF. Pt gave CSW permission to fax out to facilities in the area. Pt has no preference of facility at this time. CSW gave pt medicare.gov rating list to review. CSW explained insurance auth process. ? ?CSW will continue to follow. ? ?Expected Discharge Plan: [place] ?Barriers to Discharge: Continued Medical Work up, Ship broker ? ? ?Patient Goals and CMS Choice ?Patient states their goals for this hospitalization and ongoing recovery are:: to get stronger ?CMS Medicare.gov Compare Post Acute Care list provided to:: Patient ?Choice offered to / list presented to : Patient ? ?Expected Discharge Plan and Services ?Expected Discharge Plan: [place] ?  ?  ?  ?Living arrangements for the past 2 months: [place] ?                ?  ?  ?  ?  ?  ?  ?  ?  ?  ?  ? ?Prior Living Arrangements/Services ?Living arrangements for the past 2 months: [place] ?Lives with:: Self ?Patient language and need for interpreter reviewed:: Yes ?Do you feel safe going back to the place where you live?: Yes      ?Need for Family Participation in Patient Care: Yes (Comment) ?Care giver support system in place?: Yes (comment) ?  ?Criminal Activity/Legal Involvement Pertinent to Current Situation/Hospitalization: No - Comment as needed ? ?Activities of Daily Living ?Home Assistive Devices/Equipment: Chana Bode  (specify type) ?ADL Screening (condition at time of admission) ?Patient's cognitive ability adequate to safely complete daily activities?: Yes ?Is the patient deaf or have difficulty hearing?: No ?Does the patient have difficulty seeing, even when wearing glasses/contacts?: No ?Does the patient have difficulty concentrating, remembering, or making decisions?: No ?Patient able to express need for assistance with ADLs?: Yes ?Does the patient have difficulty dressing or bathing?: No ?Independently performs ADLs?: Yes (appropriate for developmental age) ?Does the patient have difficulty walking or climbing stairs?: Yes ?Weakness of Legs: None ?Weakness of Arms/Hands: None ? ?Permission Sought/Granted ?Permission sought to share information with : Family Supports, Customer service manager ?Permission granted to share information with : Yes, Verbal Permission Granted ?   ? Permission granted to share info w AGENCY: SNF ?   ?   ? ?Emotional Assessment ?Appearance:: Appears stated age ?Attitude/Demeanor/Rapport: Ambitious ?Affect (typically observed): Appropriate ?Orientation: : Oriented to Self, Oriented to Place, Oriented to  Time ?Alcohol / Substance Use: Not Applicable ?Psych Involvement: No (comment) ? ?Admission diagnosis:  Cellulitis [L03.90] ?Cellulitis of left lower extremity [L03.116] ?Patient Active Problem List  ? Diagnosis Date Noted  ? Hypokalemia 09/03/2021  ? Hypoxia 09/02/2021  ? Cellulitis 08/01/2021  ? UTI (urinary tract infection) 07/30/2021  ? Hypotension 07/30/2021  ? Hyperkalemia 07/30/2021  ? Renal insufficiency 07/11/2021  ? Acute on chronic diastolic CHF (congestive heart failure) ([place]) 07/11/2021  ?  Acute respiratory failure with hypoxia ([place]) 07/10/2021  ? Cellulitis of right leg 10/15/2019  ? Hyperlipidemia 10/15/2019  ? AKI (acute kidney injury) ([place]) 10/15/2019  ? Bullous pemphigoid 10/15/2019  ? Cellulitis and abscess of toe of right foot 07/25/2019  ? Foot osteomyelitis, right ([place])  06/22/2019  ? Left leg cellulitis 12/31/2015  ? Edema of extremities 07/06/2013  ? PVC's (premature ventricular contractions)   ? Degenerative arthritis of hip 10/14/2012  ? Postoperative anemia due to acute blood loss 06/04/2011  ? Asthma, chronic 06/04/2011  ? Hyponatremia 06/04/2011  ? Tachycardia 04/07/2011  ? Hypertension 04/07/2011  ? BPH (benign prostatic hyperplasia) 04/07/2011  ? DJD (degenerative joint disease) 04/07/2011  ? ?PCP:  [facility] ?Pharmacy:   ?[id number] [place] [address]?Phone: [phone]: [phone]? ? ? ? ?Social Determinants of Health (SDOH) Interventions ?  ? ?Readmission Risk Interventions ?   ? View : No data to display.  ?  ?  ?  ? ?MEmeterio Reeve LCSW ?Clinical Social Worker ? ? ?

## 2021-09-03 NOTE — Evaluation (Signed)
Occupational Therapy Evaluation Patient Details Name: [name] MRN: [id number] DOB: 11-27-1932 Today's Date: 09/03/2021   History of Present Illness Pt is an 88 y/o male who presents with shortness of breath and hypoxia. PMH significant for fall 10 days ago with L anterior lower rib pain and LLE skin tear, hypotension, AKI, R LE cellulitis, dCHF, neuropathy, vascular insufficiency.   Clinical Impression   Pt admitted for concerns listed above. PTA pt reported that he was fairly independent, living alone in his condo. He had friends and family come in to assist with bathing, and recently as he has become weaker, they have assisted with dressing and sit<>stands as well. At this time, pt requiring max A to power up to standing from recliner with heavy posterior lean. He is requiring max support to maintain standing and verbal cuing to lean forward onto toes, in order to flatten his feet to the floor. Recommending SNF at this time to maximize his strength and independence. OT will follow acutely.       Recommendations for follow up therapy are one component of a multi-disciplinary discharge planning process, led by the attending physician.  Recommendations may be updated based on patient status, additional functional criteria and insurance authorization.   Follow Up Recommendations  Skilled nursing-short term rehab (<3 hours/day)    Assistance Recommended at Discharge Frequent or constant Supervision/Assistance  Patient can return home with the following Two people to help with walking and/or transfers;Two people to help with bathing/dressing/bathroom;Assistance with cooking/housework;Direct supervision/assist for medications management;Assist for transportation;Help with stairs or ramp for entrance    Functional Status Assessment  Patient has had a recent decline in their functional status and demonstrates the ability to make significant improvements in function in a reasonable and  predictable amount of time.  Equipment Recommendations  Other (comment) (defer to next venue)    Recommendations for Other Services       Precautions / Restrictions Precautions Precautions: Fall Precaution Comments: Watch O2 Restrictions Weight Bearing Restrictions: No      Mobility Bed Mobility               General bed mobility comments: Pt up in recliner on entry    Transfers Overall transfer level: Needs assistance Equipment used: Rolling walker (2 wheels) Transfers: Sit to/from Stand Sit to Stand: Max assist           General transfer comment: Posterior lean and difficulty with anterior weight shift to get COM over BOS. Pt required assist with placing feet flat on the floor. Pt unable to maintain standing without max assist due to posterior lean and weakness      Balance Overall balance assessment: Needs assistance Sitting-balance support: Feet supported, No upper extremity supported Sitting balance-Leahy Scale: Poor Sitting balance - Comments: posterior and R lateral lean. Increased time required for pt to gain/maintain sitting balance. Postural control: Posterior lean, Right lateral lean Standing balance support: Bilateral upper extremity supported, Reliant on assistive device for balance Standing balance-Leahy Scale: Poor                             ADL either performed or assessed with clinical judgement   ADL Overall ADL's : Needs assistance/impaired Eating/Feeding: Independent;Sitting   Grooming: Independent;Sitting   Upper Body Bathing: Minimal assistance;Sitting   Lower Body Bathing: Maximal assistance;Sitting/lateral leans;Sit to/from stand   Upper Body Dressing : Minimal assistance;Sitting   Lower Body Dressing: Total assistance;Sitting/lateral leans;Sit to/from stand  Toilet Transfer: Maximal assistance;Stand-pivot;+2 for safety/equipment   Toileting- Clothing Manipulation and Hygiene: Maximal assistance;Sitting/lateral  lean;Sit to/from stand         General ADL Comments: Pt presenting with increased weakness, requring max assist for all LB ADL's and all OOB mobiltiy     Vision Baseline Vision/History: 0 No visual deficits Ability to See in Adequate Light: 0 Adequate Patient Visual Report: No change from baseline Vision Assessment?: No apparent visual deficits     Perception     Praxis      Pertinent Vitals/Pain Pain Assessment Pain Assessment: No/denies pain     Hand Dominance Right   Extremity/Trunk Assessment Upper Extremity Assessment Upper Extremity Assessment: RUE deficits/detail RUE Deficits / Details: Pt reports "bad" right shoulder. RUE: Shoulder pain with ROM RUE Sensation: WNL RUE Coordination: decreased gross motor   Lower Extremity Assessment Lower Extremity Assessment: Defer to PT evaluation   Cervical / Trunk Assessment Cervical / Trunk Assessment: Other exceptions Cervical / Trunk Exceptions: Forward head posture with rounded shoulders   Communication Communication Communication: No difficulties   Cognition Arousal/Alertness: Awake/alert Behavior During Therapy: Flat affect (Mild) Overall Cognitive Status: Within Functional Limits for tasks assessed                                       General Comments  VSS on 2L    Exercises     Shoulder Instructions      Home Living Family/patient expects to be discharged to:: Private residence Living Arrangements: Alone Available Help at Discharge: Family;Available 24 hours/day Type of Home: House Home Access: Stairs to enter [employer] of Steps: 3 Entrance Stairs-Rails: None Home Layout: One level     Bathroom Shower/Tub: Producer, television/film/video: Handicapped height (bsc over the toilet as well) Bathroom Accessibility: No (rollator and RW fit through the door, not the wheelchair though.)   Home Equipment: Agricultural consultant (2 wheels);Rollator (4 wheels);Wheelchair -  manual;Shower seat - built in;BSC/3in1;Grab bars - tub/shower          Prior Functioning/Environment Prior Level of Function : Needs assist       Physical Assist : Mobility (physical);ADLs (physical) Mobility (physical): Bed mobility;Transfers;Gait;Stairs ADLs (physical): Grooming;Bathing;Dressing;Toileting Mobility Comments: Using the Rollator for mobility. Son assisting for transfers on/off the commode. ADLs Comments: Not taking many showers as son couldnt get him in the shower safely.        OT Problem List: Decreased strength;Decreased activity tolerance;Impaired balance (sitting and/or standing);Decreased coordination;Decreased safety awareness;Obesity;Impaired UE functional use      OT Treatment/Interventions: Self-care/ADL training;Therapeutic exercise;Energy conservation;DME and/or AE instruction;Therapeutic activities;Patient/family education;Balance training    OT Goals(Current goals can be found in the care plan section) Acute Rehab OT Goals Patient Stated Goal: To go home OT Goal Formulation: With patient Time For Goal Achievement: 09/17/21 Potential to Achieve Goals: Good ADL Goals Pt Will Perform Lower Body Bathing: with min assist;sitting/lateral leans;sit to/from stand;with adaptive equipment Pt Will Perform Lower Body Dressing: with min assist;with adaptive equipment;sit to/from stand;sitting/lateral leans Pt Will Transfer to Toilet: with min assist;ambulating Pt Will Perform Toileting - Clothing Manipulation and hygiene: with min assist;sitting/lateral leans;sit to/from stand;with adaptive equipment Additional ADL Goal #1: Pt will tolerate 5 mins of a standing ADL task, to improve activity tolerance.  OT Frequency: Min 2X/week    Co-evaluation              AM-PAC OT "  6 Clicks" Daily Activity     Outcome Measure Help from another person eating meals?: None Help from another person taking care of personal grooming?: A Little Help from another person  toileting, which includes using toliet, bedpan, or urinal?: A Lot Help from another person bathing (including washing, rinsing, drying)?: A Lot Help from another person to put on and taking off regular upper body clothing?: A Little Help from another person to put on and taking off regular lower body clothing?: Total 6 Click Score: 15   End of Session Equipment Utilized During Treatment: Gait belt;Rolling walker (2 wheels);Oxygen Nurse Communication: Mobility status  Activity Tolerance: Patient tolerated treatment well Patient left: in chair;with call bell/phone within reach;with chair alarm set;with family/visitor present  OT Visit Diagnosis: Unsteadiness on feet (R26.81);Other abnormalities of gait and mobility (R26.89);Muscle weakness (generalized) (M62.81)                Time: 4098-1191 OT Time Calculation (min): 15 min Charges:  OT General Charges $OT Visit: 1 Visit OT Evaluation $OT Eval Moderate Complexity: 1 Mod  Seng Larch H., OTR/L Acute Rehabilitation  [place] [place] Elane Bing Plume 09/03/2021, 5:55 PM

## 2021-09-03 NOTE — [place] FL2 (Signed)
?[place] MEDICAID FL2 LEVEL OF CARE SCREENING TOOL  ?  ? ?IDENTIFICATION  ?Patient Name: ?[name] Birthdate: [birth year] Sex: male Admission Date (Current Location): ?09/01/2021  ?[place] and [place] Number: ? [place] ?  Facility and Address:  ?[facility], [place] [address], [place], [place] [address] ?     Provider Number: ?[id number]  ?Attending Physician Name and Address:  ?Pokhrel, Corrie Mckusick, MD ? Relative Name and Phone Number:  ?  ?   ?Current Level of Care: ?Hospital Recommended Level of Care: ?[place] Prior Approval Number: ?  ? ?Date Approved/Denied: ?09/03/21 PASRR Number: ?[id number] ? ?Discharge Plan: ?SNF ?  ? ?Current Diagnoses: ?Patient Active Problem List  ? Diagnosis Date Noted  ? Hypoxia 09/02/2021  ? Cellulitis 08/01/2021  ? UTI (urinary tract infection) 07/30/2021  ? Hypotension 07/30/2021  ? Hyperkalemia 07/30/2021  ? Renal insufficiency 07/11/2021  ? Acute on chronic diastolic CHF (congestive heart failure) ([place]) 07/11/2021  ? Acute respiratory failure with hypoxia ([place]) 07/10/2021  ? Cellulitis of right leg 10/15/2019  ? Hyperlipidemia 10/15/2019  ? AKI (acute kidney injury) ([place]) 10/15/2019  ? Bullous pemphigoid 10/15/2019  ? Cellulitis and abscess of toe of right foot 07/25/2019  ? Foot osteomyelitis, right ([place]) 06/22/2019  ? Left leg cellulitis 12/31/2015  ? Edema of extremities 07/06/2013  ? PVC's (premature ventricular contractions)   ? Degenerative arthritis of hip 10/14/2012  ? Postoperative anemia due to acute blood loss 06/04/2011  ? Asthma, chronic 06/04/2011  ? Hyponatremia 06/04/2011  ? Tachycardia 04/07/2011  ? Hypertension 04/07/2011  ? BPH (benign prostatic hyperplasia) 04/07/2011  ? DJD (degenerative joint disease) 04/07/2011  ? ? ?Orientation RESPIRATION BLADDER Height & Weight   ?  ?Self, Time, Situation, Place ? O2 (NC2L) External catheter Weight: 257 lb 11.5 oz (116.9 kg) ?Height:  '5\' 10"'$  (177.8 cm)  ?BEHAVIORAL SYMPTOMS/MOOD  NEUROLOGICAL BOWEL NUTRITION STATUS  ?    Continent Diet (See d/c summary)  ?AMBULATORY STATUS COMMUNICATION OF NEEDS Skin   ?Extensive Assist Verbally Normal ?  ?  ?  ?    ?     ?     ? ? ?Personal Care Assistance Level of Assistance  ?Bathing, Feeding, Dressing Bathing Assistance: Limited assistance ?Feeding assistance: Independent ?Dressing Assistance: Limited assistance ?   ? ?Functional Limitations Info  ?Sight, Hearing, Speech Sight Info: Impaired ?Hearing Info: Adequate ?Speech Info: Adequate  ? ? ?SPECIAL CARE FACTORS FREQUENCY  ?PT (By licensed PT), OT (By licensed OT)   ?  ?PT Frequency: 5x/week ?OT Frequency: 5x/week ?  ?  ?  ?   ? ? ?Contractures Contractures Info: Not present  ? ? ?Additional Factors Info  ?Code Status, Allergies Code Status Info: DNR ?Allergies Info: Spironolactone, Hydrocodone ?  ?  ?  ?   ? ?Current Medications (09/03/2021):  This is the current hospital active medication list ?Current Facility-Administered Medications  ?Medication Dose Route Frequency Provider Last Rate Last Admin  ? albuterol (PROVENTIL) (2.5 MG/3ML) 0.083% nebulizer solution 2.5 mg  2.5 mg Nebulization Q6H PRN Shela Leff, MD   2.5 mg at 09/02/21 0054  ? cloNIDine (CATAPRES) tablet 0.2 mg  0.2 mg Oral BID Shela Leff, MD   0.2 mg at 09/03/21 0940  ? finasteride (PROSCAR) tablet 5 mg  5 mg Oral Daily Shela Leff, MD   5 mg at 09/03/21 0940  ? folic acid (FOLVITE) tablet 1 mg  1 mg Oral q morning Shela Leff, MD   1  mg at 09/02/21 1103  ? furosemide (LASIX) injection 40 mg  40 mg Intravenous BID Shela Leff, MD   40 mg at 09/03/21 4854  ? gabapentin (NEURONTIN) capsule 300 mg  300 mg Oral BID Shela Leff, MD   300 mg at 09/03/21 6270  ? guaiFENesin-dextromethorphan (ROBITUSSIN DM) 100-10 MG/5ML syrup 5 mL  5 mL Oral Q4H PRN Shela Leff, MD   5 mL at 09/02/21 1839  ? LORazepam (ATIVAN) tablet 0.5 mg  0.5 mg Oral Q6H PRN Shela Leff, MD      ? magnesium oxide  (MAG-OX) tablet 400 mg  400 mg Oral BID Pokhrel, [place], MD   400 mg at 09/03/21 0940  ? melatonin tablet 3 mg  3 mg Oral QHS PRN Shela Leff, MD      ? metoprolol succinate (TOPROL-XL) 24 hr tablet 150 mg  150 mg Oral Daily Shela Leff, MD   150 mg at 09/03/21 3500  ? montelukast (SINGULAIR) tablet 10 mg  10 mg Oral Daily Shela Leff, MD   10 mg at 09/03/21 0940  ? potassium chloride SA (KLOR-CON M) CR tablet 40 mEq  40 mEq Oral Once Pokhrel, Laxman, MD      ? traMADol (ULTRAM) tablet 50 mg  50 mg Oral Q6H PRN Shela Leff, MD      ? vancomycin (VANCOREADY) IVPB 1250 mg/250 mL  1,250 mg Intravenous Q24H Heloise Purpura, RPH 166.7 mL/hr at 09/02/21 2005 1,250 mg at 09/02/21 2005  ? ? ? ?Discharge Medications: ?Please see discharge summary for a list of discharge medications. ? ?Relevant Imaging Results: ? ?Relevant Lab Results: ? ? ?Additional Information ?SSN [id number];ZJIRC vaccine [employer] x2 ? ?[place], LCSW ? ? ? ? ?

## 2021-09-04 DIAGNOSIS — I5033 Acute on chronic diastolic (congestive) heart failure: Secondary | ICD-10-CM | POA: Diagnosis not present

## 2021-09-04 DIAGNOSIS — L120 Bullous pemphigoid: Secondary | ICD-10-CM | POA: Diagnosis not present

## 2021-09-04 DIAGNOSIS — L03119 Cellulitis of unspecified part of limb: Secondary | ICD-10-CM | POA: Diagnosis not present

## 2021-09-04 DIAGNOSIS — J45909 Unspecified asthma, uncomplicated: Secondary | ICD-10-CM | POA: Diagnosis not present

## 2021-09-04 LAB — BASIC METABOLIC PANEL
Anion gap: 6 (ref 5–15)
BUN: 15 mg/dL (ref 8–23)
CO2: 31 mmol/L (ref 22–32)
Calcium: 8.5 mg/dL — ABNORMAL LOW (ref 8.9–10.3)
Chloride: 98 mmol/L (ref 98–111)
Creatinine, Ser: 0.89 mg/dL (ref 0.61–1.24)
GFR, Estimated: >60 mL/min (ref >60)
Glucose, Bld: 101 mg/dL — ABNORMAL HIGH (ref 70–99)
Potassium: 3.5 mmol/L (ref 3.5–5.1)
Sodium: 135 mmol/L (ref 135–145)

## 2021-09-04 LAB — CBC
HCT: 24.8 % — ABNORMAL LOW (ref 39.0–52.0)
Hemoglobin: 8.3 g/dL — ABNORMAL LOW (ref 13.0–17.0)
MCH: 34.9 pg — ABNORMAL HIGH (ref 26.0–34.0)
MCHC: 33.5 g/dL (ref 30.0–36.0)
MCV: 104.2 fL — ABNORMAL HIGH (ref 80.0–100.0)
Platelets: 147 10*3/uL — ABNORMAL LOW (ref 150–400)
RBC: 2.38 MIL/uL — ABNORMAL LOW (ref 4.22–5.81)
RDW: 15.2 % (ref 11.5–15.5)
WBC: 5.3 10*3/uL (ref 4.0–10.5)
nRBC: 0 % (ref 0.0–0.2)

## 2021-09-04 LAB — MAGNESIUM: Magnesium: 1.9 mg/dL (ref 1.7–2.4)

## 2021-09-04 MED ORDER — ENOXAPARIN SODIUM 60 MG/0.6ML IJ SOSY
60.0000 mg | PREFILLED_SYRINGE | INTRAMUSCULAR | Status: DC
  Administered 2021-09-05 – 2021-09-07 (×3): 60 mg via SUBCUTANEOUS
  Filled 2021-09-04 (×3): qty 0.6

## 2021-09-04 MED ORDER — SENNA 8.6 MG PO TABS
1.0000 | ORAL_TABLET | Freq: Every day | ORAL | Status: DC
  Administered 2021-09-04 – 2021-09-08 (×5): 8.6 mg via ORAL
  Filled 2021-09-04 (×5): qty 1

## 2021-09-04 MED ORDER — POTASSIUM CHLORIDE CRYS ER 20 MEQ PO TBCR
40.0000 meq | EXTENDED_RELEASE_TABLET | Freq: Every day | ORAL | Status: DC
  Administered 2021-09-04 – 2021-09-08 (×5): 40 meq via ORAL
  Filled 2021-09-04 (×5): qty 2

## 2021-09-04 MED ORDER — POLYETHYLENE GLYCOL 3350 17 G PO PACK
17.0000 g | PACK | Freq: Every day | ORAL | Status: DC
  Administered 2021-09-04 – 2021-09-08 (×5): 17 g via ORAL
  Filled 2021-09-04 (×5): qty 1

## 2021-09-04 NOTE — Plan of Care (Signed)

## 2021-09-04 NOTE — TOC Progression Note (Addendum)
Transition of Care (TOC) - Progression Note  ? ? ?Patient Details  ?Name: [name] ?MRN: [id number] ?Date of Birth: 14-Sep-1932 ? ?Transition of Care (TOC) CM/SW Contact  ?Emeterio Reeve, LCSW ?Phone Number: ?09/04/2021, 11:20 AM ? ?Clinical Narrative:    ? ?Pt was at [facility] 3/21-4/7. Pt has used 17 of his 20 paid SNF days.  ? ?3pm- pt has 11 days, not 17.  ? ?Expected Discharge Plan: [place] ?Barriers to Discharge: Continued Medical Work up, Ship broker ? ?Expected Discharge Plan and Services ?Expected Discharge Plan: [place] ?  ?  ?  ?Living arrangements for the past 2 months: [place] ?                ?  ?  ?  ?  ?  ?  ?  ?  ?  ?  ? ? ?Social Determinants of Health (SDOH) Interventions ?  ? ?Readmission Risk Interventions ?   ? View : No data to display.  ?  ?  ?  ? ?Emeterio Reeve, LCSW ?Clinical Social Worker ? ?

## 2021-09-04 NOTE — Progress Notes (Signed)
Pharmacy Antibiotic Note ? ?[name] is a 88 y.o. male for which pharmacy has been consulted for vancomycin dosing for cellulitis. Patient presenting with fall. ? ?SCr 0.89 - at baseline ?WBC trending down to 5.3; Afebrile ? ?Plan: ?Continue vancomycin 1250 mg q24h ?F/u cultures, clinical course, WBC, fever ?De-escalate when able ?Levels at steady state ? ?Height: '5\' 10"'$  (177.8 cm) ?Weight: 113.8 kg (250 lb 14.1 oz) ?IBW/kg (Calculated) : 73 ? ?Temp (24hrs), Avg:97.9 ?F (36.6 ?C), Min:97.6 ?F (36.4 ?C), Max:98.3 ?F (36.8 ?C) ? ?Recent Labs  ?Lab 09/01/21 ?1905 09/02/21 ?0248 09/03/21 ?0827 09/04/21 ?0241  ?WBC 15.8* 15.4* 5.2 5.3  ?CREATININE 1.21 1.18 0.87 0.89  ? ?  ?Estimated Creatinine Clearance: 75.3 mL/min (by C-G formula based on SCr of 0.89 mg/dL).   ? ?Allergies  ?Allergen Reactions  ? Spironolactone Nausea Only  ? Hydrocodone Nausea And Vomiting  ? ? ?Antimicrobials this admission: ?vancomycin 4/17 >>  ? ?Microbiology results: ?N/A ? ?Thank you for allowing pharmacy to be a part of this patient?s care. ? ?Luisa Hart, PharmD, BCPS ?Clinical Pharmacist ?09/04/2021 2:00 PM  ? ?Please refer to [facility] for pharmacy phone number  ?

## 2021-09-04 NOTE — Progress Notes (Addendum)
?PROGRESS NOTE ? ? ? ?[name]  [name]:[id number] DOB: [birth year] DOA: 09/01/2021 ?PCP: [facility]  ? ? ?Brief Narrative:  ?[name] is a 88 y.o. male with medical history significant of chronic diastolic CHF, hypertension, BPH, anxiety, bullous pemphigoid, asthma, hyperlipidemia, chronic hyponatremia presented to the hospital this time with generalized weakness and low oxygen.  He reported a fall 10 days back with pain over the left anterior lower ribs.  Patient also had left lower extremity tear and patient was given  doxycycline for leg infection recently.  Patient now reported increasing generalized weakness to the point that he has been using wheelchair at home.  Of note, patient was admitted last month for hypotension, AKI, right lower extremity cellulitis, and UTI.  On this presentation, patient had mild leukocytosis at 15.8, hemoglobin of 8.9, elevated MCV,  Sodium 128 (baseline low 130s), potassium 3.5, chloride 94, bicarb 26, BUN 25, creatinine 1.2, glucose 102.  Urine dipstick showing negative nitrite, trace leukocytes.  BNP 342.  High-sensitivity troponin negative x2.  COVID and influenza PCR negative.  Chest x-ray showing no active cardiopulmonary disease.  Patient was then admitted hospital for further evaluation and treatment ? ?Assessment and plan ? ?Principal Problem: ?  Cellulitis ?Active Problems: ?  Hypertension ?  BPH (benign prostatic hyperplasia) ?  Asthma, chronic ?  Hyponatremia ?  Bullous pemphigoid ?  Acute on chronic diastolic CHF (congestive heart failure) ([place]) ?  Hypoxia ?  Hypokalemia ?  ?Cellulitis of lower legs, left leg skin tear with flap. ?Patient was recently admitted last month for lower extremity cellulitis and was treated with antibiotic.  Again had superficial skin tear few days back.  Patient presented with erythema and leukocytosis at this time.  On IV vancomycin.  Wound care was consulted for dressing needs and recommended surgical  intervention for flap removal.  Patient was seen by Dr. Claudia Desanctis plastic surgery  and bedside debridement was done.  At this time patient will continue wound care.  Continue IV antibiotic.  Will change to oral antibiotic on discharge.   ? ?Acute on chronic diastolic CHF ?With gross bilateral lower extremity edema.  2D echocardiogram from February 2023 with normal systolic function and grade 2 diastolic dysfunction.  On Lasix 40 mg IV twice daily.  Continue strict intake and output charting, daily weights and low-salt diet.  Patient is negative balance for 3439 ml at this time with a 7 pound weight loss..  We will continue to monitor.  Patient takes 40 mg oral Lasix twice daily at home. ? ?Hypokalemia.  Improved after replacement.  Potassium was 3.5.  We will continue to replenish while on diuretics ?  ?Bilateral lower extremity edema ?Likely secondary to chronic venous stasis and decompensated heart failure.  Bilateral lower extremity ultrasound was negative for DVT.  Edema has improved with diuretics.. ?  ?Hypoxia ?Requiring supplemental oxygen at 2 L/min.  Could be exacerbated by rib pain.  Chest x-ray without acute findings.  COVID and influenza negative.  Troponins negative.  Continue to wean as able as we diurese, pain control. Patient has qualified for oxygen at this time.  Looks like patient was using as needed oxygen 1 L at home. ?  ?Rib pain/ Abdominal bruising ?Status post fall.  Likely hypoxia from decreased chest wall excursion.  Continue pain management with tramadol, gabapentin.   ?  ?Abnormal urinalysis ?No urinary symptoms.  We will hold off with antibiotic for now.  Was treated for UTI last  month.   ? ?Chronic macrocytic anemia ?Hemoglobin 8.9 presentation., baseline 11-12.  Continue folic acid from home.  Folic acid level at 26, vitamin B12 420.  Latest hemoglobin of 8.5. ? ?,Acute on chronic hyponatremia ?Likely due to hypervolemia.  Sodium 128 on presentation., baseline in the low 130s. Continue IV  diuresis.  Sodium level has improved to 135 this morning. ?  ?Hypertension ?Continue clonidine and metoprolol, blood pressure is controlled at this time.  Continue with diuresis. ?  ?BPH ?-Continue finasteride ?  ?Anxiety ?-Continue home Ativan as needed ?  ?Bullous pemphigoid ?-Hold methotrexate given concern for active infection ?  ?Asthma ?Continue Singulair and albuterol ? ?Generalized weakness, deconditioning, debility ?Physical therapy has been consulted at this time and recommended skilled nursing facility placement.  Patient is debating between home and skilled nursing facility.  I have spoken with the patient's friend yesterday who strongly wishes him to go to skilled nursing facility.  ? ? DVT prophylaxis:  ?We will start Lovenox subcu ?Code Status:   ?  Code Status: DNR ? ?Disposition: Skilled nursing facility in 1 to 2 days.  Lives by himself at home and is independent.  PT recommends skilled nursing facility placement at this time. ? ?Status is: Inpatient ? ?Remains inpatient appropriate because: IV antibiotic, wound care, diuretics, need for rehabilitation. ? ? Family Communication:  ?Communicated with patient's friend on the phone and updated him about the clinical condition of the patient on 09/03/2021.   ? ?Consultants:  ?Wound care ? ?Procedures:  ?None ? ?Antimicrobials:  ?Vancomycin IV ? ?Anti-infectives (From admission, onward)  ? ? Start     Dose/Rate Route Frequency Ordered Stop  ? 09/02/21 2100  vancomycin (VANCOREADY) IVPB 1250 mg/250 mL  Status:  Discontinued       ? 1,250 mg ?166.7 mL/hr over 90 Minutes Intravenous  Once 09/01/21 2201 09/02/21 0022  ? 09/02/21 2000  vancomycin (VANCOREADY) IVPB 1250 mg/250 mL       ? 1,250 mg ?166.7 mL/hr over 90 Minutes Intravenous Every 24 hours 09/02/21 0025    ? 09/01/21 2115  vancomycin (VANCOREADY) IVPB 2000 mg/400 mL       ? 2,000 mg ?200 mL/hr over 120 Minutes Intravenous  Once 09/01/21 2103 09/02/21 0016  ? ?  ? ?Subjective: ?Today, patient was  seen and examined at bedside.  Denies any nausea vomiting fever chills or rigor.   Denies any chest pain.  Leg swelling improving. ? ?Objective: ?Vitals:  ? 09/04/21 0445 09/04/21 0500 09/04/21 0827 09/04/21 1041  ?BP: (!) 141/70  (!) 155/69   ?Pulse: 62  61 68  ?Resp: 17  20   ?Temp: 97.9 ?F (36.6 ?C)  98.3 ?F (36.8 ?C)   ?TempSrc: Oral  Oral   ?SpO2: 92%  90%   ?Weight:  113.8 kg    ?Height:      ? ? ?Intake/Output Summary (Last 24 hours) at 09/04/2021 1129 ?Last data filed at 09/04/2021 0445 ?Gross per 24 hour  ?Intake 360 ml  ?Output 1950 ml  ?Net -1590 ml  ? ?Filed Weights  ? 09/01/21 1803 09/03/21 0500 09/04/21 0500  ?Weight: 115.2 kg 116.9 kg 113.8 kg  ? ? ?Physical Examination: ? ?General:  Average built, not in obvious distress, elderly male, deconditioned, ?HENT:   No scleral pallor or icterus noted. Oral mucosa is moist.  ?Chest:    Diminished breath sounds bilaterally.   Bruise over the left lower chest wall. ?CVS: S1 &S2 heard. No murmur.  Regular rate  and rhythm. ?Abdomen: Soft, nontender, nondistended.  Bowel sounds are heard.   ?Extremities: No cyanosis, clubbing with bilateral edema, left leg with dressing.   ?Psych: Alert, awake and oriented, normal mood ?CNS:  No cranial nerve deficits.  Power equal in all extremities.   ?Skin: Warm and dry.  See extremity. ? ? ?Data Reviewed:  ? ?CBC: ?Recent Labs  ?Lab 09/01/21 ?1905 09/02/21 ?0248 09/03/21 ?0827 09/04/21 ?0241  ?WBC 15.8* 15.4* 5.2 5.3  ?NEUTROABS 13.4*  --   --   --   ?HGB 8.9* 9.7* 8.5* 8.3*  ?HCT 27.1* 29.8* 24.6* 24.8*  ?MCV 104.6* 106.4* 103.4* 104.2*  ?PLT 155 155 134* 147*  ? ? ?Basic Metabolic Panel: ?Recent Labs  ?Lab 09/01/21 ?1905 09/02/21 ?0248 09/02/21 ?0932 09/03/21 ?0827 09/04/21 ?8472  ?NA 128* 131*  --  133* 135  ?K 3.5 3.3*  --  3.0* 3.5  ?CL 94* 93*  --  95* 98  ?CO2 26 27  --  30 31  ?GLUCOSE 102* 104*  --  109* 101*  ?BUN 25* 21  --  16 15  ?CREATININE 1.21 1.18  --  0.87 0.89  ?CALCIUM 8.5* 8.7*  --  8.3* 8.5*  ?MG  --   --   1.7  --  1.9  ? ? ?Liver Function Tests: ?No results for input(s): AST, ALT, ALKPHOS, BILITOT, PROT, ALBUMIN in the last 168 hours. ? ? ?Radiology Studies: ?No results found. ? ? ? LOS: 2 days  ? ? ?Lashundra Shiveley

## 2021-09-04 NOTE — Progress Notes (Signed)
Physical Therapy Treatment ?Patient Details ?Name: [name] ?MRN: [id number] ?DOB: 02/16/1933 ?Today's Date: 09/04/2021 ? ? ?History of Present Illness Pt is an 88 y/o male who presents with shortness of breath and hypoxia. PMH significant for fall 10 days ago with L anterior lower rib pain and LLE skin tear, hypotension, AKI, R LE cellulitis, dCHF, neuropathy, vascular insufficiency. ? ?  ?PT Comments  ? ? Pt progressing slowly towards physical therapy goals. Pt required increased assist this session for basic functional mobility. A majority of the session was focused on linen change due to male purewick system leak. Pt hesitant to mobilize, endorsing fear of falling. Will continue to follow and progress as able per POC.  ?  ?Recommendations for follow up therapy are one component of a multi-disciplinary discharge planning process, led by the attending physician.  Recommendations may be updated based on patient status, additional functional criteria and insurance authorization. ? ?Follow Up Recommendations ? Skilled nursing-short term rehab (<3 hours/day) ?  ?  ?Assistance Recommended at Discharge Frequent or constant Supervision/Assistance  ?Patient can return home with the following A lot of help with walking and/or transfers;Assistance with cooking/housework;Assist for transportation;Help with stairs or ramp for entrance ?  ?Equipment Recommendations ? None recommended by PT  ?  ?Recommendations for Other Services   ? ? ?  ?Precautions / Restrictions Precautions ?Precautions: Fall ?Precaution Comments: Watch O2 ?Restrictions ?Weight Bearing Restrictions: No  ?  ? ?Mobility ? Bed Mobility ?Overal bed mobility: Needs Assistance ?Bed Mobility: Rolling, Sidelying to Sit ?Rolling: Mod assist ?Sidelying to sit: Max assist ?  ?  ?  ?General bed mobility comments: Assist for all aspects of bed mobility including scooting forward to get feet on the floor. Pt required cues for railing use and increased time  throughout. ?  ? ?Transfers ?Overall transfer level: Needs assistance ?Equipment used: Rolling walker (2 wheels) ?Transfers: Sit to/from Stand, Bed to chair/wheelchair/BSC ?Sit to Stand: Max assist, +2 physical assistance ?  ?Step pivot transfers: Min assist, +2 physical assistance ?  ?  ?  ?General transfer comment: Posterior lean and difficulty with anterior weight shift to get COM over BOS. Today, +2 max assist required to achieve full stand. Intermittent instances of posterior lean with difficulty correcting. ?  ? ?Ambulation/Gait ?  ?  ?  ?  ?  ?  ?  ?General Gait Details: Unable to progress at this time. ? ? ?Stairs ?  ?  ?  ?  ?  ? ? ?Wheelchair Mobility ?  ? ?Modified Rankin (Stroke Patients Only) ?  ? ? ?  ?Balance Overall balance assessment: Needs assistance ?Sitting-balance support: Feet supported, No upper extremity supported ?Sitting balance-Leahy Scale: Poor ?Sitting balance - Comments: posterior and R lateral lean. Increased time required for pt to gain/maintain sitting balance. ?Postural control: Posterior lean, Right lateral lean ?Standing balance support: Bilateral upper extremity supported, Reliant on assistive device for balance ?Standing balance-Leahy Scale: Zero ?Standing balance comment: +2 required ?  ?  ?  ?  ?  ?  ?  ?  ?  ?  ?  ?  ? ?  ?Cognition Arousal/Alertness: Awake/alert ?Behavior During Therapy: Flat affect, Anxious (Mild) ?Overall Cognitive Status: Decreased cognition today. Short term memory deficits noted, as pt repeating his questions and does not remember that he was out of bed with therapy yesterday despite several reorientations to time and situation.  ?  ?  ?  ?  ?  ?  ?  ?  ?  ?  ?  ?  ?  ?  ?  ?  ?  General Comments: Pt reports he is fearful of falling. Anxious at times ?  ?  ? ?  ?Exercises   ? ?  ?General Comments   ?  ?  ? ?Pertinent Vitals/Pain Pain Assessment ?Pain Assessment: No/denies pain  ? ? ?Home Living   ?  ?  ?  ?  ?  ?  ?  ?  ?  ?   ?  ?Prior Function    ?  ?   ?   ? ?PT Goals (current goals can now be found in the care plan section) Acute Rehab PT Goals ?Patient Stated Goal: Return to PLOF and eventually get home ?PT Goal Formulation: With patient/family ?Time For Goal Achievement: 09/17/21 ?Potential to Achieve Goals: Good ?Progress towards PT goals: Progressing toward goals ? ?  ?Frequency ? ? ? Min 3X/week ? ? ? ?  ?PT Plan Current plan remains appropriate  ? ? ?Co-evaluation   ?  ?  ?  ?  ? ?  ?AM-PAC PT "6 Clicks" Mobility   ?Outcome Measure ? Help needed turning from your back to your side while in a flat bed without using bedrails?: A Lot ?Help needed moving from lying on your back to sitting on the side of a flat bed without using bedrails?: A Lot ?Help needed moving to and from a bed to a chair (including a wheelchair)?: A Lot ?Help needed standing up from a chair using your arms (e.g., wheelchair or bedside chair)?: Total ?Help needed to walk in hospital room?: Total ?Help needed climbing 3-5 steps with a railing? : Total ?6 Click Score: 9 ? ?  ?End of Session Equipment Utilized During Treatment: Gait belt;Oxygen ?Activity Tolerance: Patient limited by fatigue ?Patient left: in chair;with call bell/phone within reach;with chair alarm set;with family/visitor present ?Nurse Communication: Mobility status;Need for lift equipment Charlaine Dalton) ?PT Visit Diagnosis: Unsteadiness on feet (R26.81);Difficulty in walking, not elsewhere classified (R26.2) ?  ? ? ?Time: 2458-0998 ?PT Time Calculation (min) (ACUTE ONLY): 27 min ? ?Charges:  $Gait Training: 23-37 mins          ?          ? ?Rolinda Roan, PT, DPT ?Acute Rehabilitation Services ?Secure Chat Preferred ?Office: [phone]  ? ? ?Thelma Comp ?09/04/2021, 3:32 PM ? ?

## 2021-09-04 NOTE — TOC Progression Note (Signed)
Transition of Care (TOC) - Progression Note  ? ? ?Patient Details  ?Name: [name] ?MRN: [id number] ?Date of Birth: 05/28/1932 ? ?Transition of Care (TOC) CM/SW Contact  ?Emeterio Reeve, LCSW ?Phone Number: ?09/04/2021, 3:40 PM ? ?Clinical Narrative:    ? ?CSW met with pt at bedside and gave bed offers. Pt chose to return to [facility].  ? ?CSW started insurance auth. Reference number [id number]. ? ?Expected Discharge Plan: [place] ?Barriers to Discharge: Continued Medical Work up, Ship broker ? ?Expected Discharge Plan and Services ?Expected Discharge Plan: [place] ?  ?  ?  ?Living arrangements for the past 2 months: [place] ?                ?  ?  ?  ?  ?  ?  ?  ?  ?  ?  ? ? ?Social Determinants of Health (SDOH) Interventions ?  ? ?Readmission Risk Interventions ?   ? View : No data to display.  ?  ?  ?  ? ? ?

## 2021-09-05 DIAGNOSIS — L03119 Cellulitis of unspecified part of limb: Secondary | ICD-10-CM | POA: Diagnosis not present

## 2021-09-05 DIAGNOSIS — I5033 Acute on chronic diastolic (congestive) heart failure: Secondary | ICD-10-CM | POA: Diagnosis not present

## 2021-09-05 DIAGNOSIS — J45909 Unspecified asthma, uncomplicated: Secondary | ICD-10-CM | POA: Diagnosis not present

## 2021-09-05 DIAGNOSIS — L120 Bullous pemphigoid: Secondary | ICD-10-CM | POA: Diagnosis not present

## 2021-09-05 LAB — BASIC METABOLIC PANEL
Anion gap: 7 (ref 5–15)
BUN: 14 mg/dL (ref 8–23)
CO2: 32 mmol/L (ref 22–32)
Calcium: 8.6 mg/dL — ABNORMAL LOW (ref 8.9–10.3)
Chloride: 94 mmol/L — ABNORMAL LOW (ref 98–111)
Creatinine, Ser: 0.97 mg/dL (ref 0.61–1.24)
GFR, Estimated: >60 mL/min (ref >60)
Glucose, Bld: 100 mg/dL — ABNORMAL HIGH (ref 70–99)
Potassium: 3.8 mmol/L (ref 3.5–5.1)
Sodium: 133 mmol/L — ABNORMAL LOW (ref 135–145)

## 2021-09-05 MED ORDER — ORAL CARE MOUTH RINSE
15.0000 mL | Freq: Two times a day (BID) | OROMUCOSAL | Status: DC
  Administered 2021-09-06 – 2021-09-08 (×4): 15 mL via OROMUCOSAL

## 2021-09-05 NOTE — Progress Notes (Signed)
?PROGRESS NOTE ? ? ? ?[name]  QMG:[id number] DOB: 1933/03/05 DOA: 09/01/2021 ?PCP: [facility]  ? ? ?Brief Narrative:  ?[name] is a 88 y.o. male with medical history significant of chronic diastolic CHF, hypertension, BPH, anxiety, bullous pemphigoid, asthma, hyperlipidemia, chronic hyponatremia presented to the hospital this time with generalized weakness and low oxygen.  He reported a fall 10 days back with pain over the left anterior lower ribs.  Patient also had left lower extremity tear and patient was given  doxycycline for leg infection recently.  Patient now reported increasing generalized weakness to the point that he has been using wheelchair at home.  Of note, patient was admitted last month for hypotension, AKI, right lower extremity cellulitis, and UTI.  On this presentation, patient had mild leukocytosis at 15.8, hemoglobin of 8.9, elevated MCV,  Sodium 128 (baseline low 130s), potassium 3.5, chloride 94, bicarb 26, BUN 25, creatinine 1.2, glucose 102.  Urine dipstick showing negative nitrite, trace leukocytes.  BNP 342.  High-sensitivity troponin negative x2.  COVID and influenza PCR negative.  Chest x-ray showing no active cardiopulmonary disease.  Patient was then admitted to the hospital for further evaluation and treatment. ? ?Assessment and plan ? ?Principal Problem: ?  Cellulitis ?Active Problems: ?  Hypertension ?  BPH (benign prostatic hyperplasia) ?  Asthma, chronic ?  Hyponatremia ?  Bullous pemphigoid ?  Acute on chronic diastolic CHF (congestive heart failure) ([place]) ?  Hypoxia ?  Hypokalemia ?  ?Cellulitis of lower legs, left leg skin tear with flap. ?Patient was recently admitted last month for lower extremity cellulitis and was treated with antibiotic.  Patient presented with erythema and leukocytosis at this time with skin flap.  On IV vancomycin.  Wound care was consulted for dressing needs and recommended surgical intervention for flap  removal.  Patient was seen by Dr. Claudia Desanctis plastic surgery  and bedside debridement was done.  At this time, recommendation is to continue wound care.  Continue IV antibiotic.  Will change to oral antibiotic on discharge.   ? ?Acute on chronic diastolic CHF ?With gross bilateral lower extremity edema.  2D echocardiogram from February 2023 with normal systolic function and grade 2 diastolic dysfunction.  On Lasix 40 mg IV twice daily.  This creatinine of 0.9.  Continue strict intake and output charting, daily weights and low-salt diet.  Patient is negative balance for 6722m at this time with a 7 pound weight loss..  We will continue to monitor.  Patient takes 40 mg oral Lasix twice daily at home.  Will transition to oral Lasix on discharge. ? ?Hypokalemia.  Improved after replacement.  Latest potassium was 3.8. ? ?Bilateral lower extremity edema ?Likely secondary to chronic venous stasis and decompensated heart failure.  Bilateral lower extremity ultrasound was negative for DVT.  Edema has improved with diuretics.. ?  ?Hypoxia ?Requiring supplemental oxygen at 2 L/min.  Could be exacerbated by rib pain.  Chest x-ray without acute findings.  COVID and influenza negative.  Troponins negative.  Continue to wean as able as we diurese, pain control. Patient has qualified for oxygen at this time.  Looks like patient was using as needed oxygen 1 L at home. ?  ?Rib pain/ Abdominal bruising ?Status post fall.  Likely hypoxia from decreased chest wall excursion.  Continue pain management with tramadol, gabapentin.   ?  ?Abnormal urinalysis ?No urinary symptoms.  We will hold off with antibiotic for now.  Was treated for UTI last  month.   ? ?Chronic macrocytic anemia ?Hemoglobin 8.9 on presentation., baseline 11-12.  Continue folic acid from home.  Folic acid level at 26, vitamin B12 420.  Latest hemoglobin of 8.3. ? ?,Acute on chronic hyponatremia ?Likely due to hypervolemia.  Sodium 128 on presentation., baseline in the low  130s. Continue IV diuresis.  Latest sodium at 133 ?  ?Hypertension ?Continue clonidine and metoprolol, blood pressure is controlled at this time.  Continue with diuresis. ?  ?BPH ?-Continue finasteride ?  ?Anxiety ?-Continue home Ativan as needed ?  ?Bullous pemphigoid ?-Hold methotrexate given concern for active infection ?  ?Asthma ?Continue Singulair and albuterol ? ?Generalized weakness, deconditioning, debility ?Physical therapy has been consulted at this time and recommended skilled nursing facility placement.  Awaiting for skilled nursing facility placement at this time.  ? ? DVT prophylaxis:  ?We will start Lovenox subcu ?Code Status:   ?  Code Status: DNR ? ?Disposition: Skilled nursing facility when bed available.  Patient is medically stable for disposition.  As per the patient patient lives by himself at home and his friends check on him. ? ?Status is: Inpatient ? ?Remains inpatient appropriate because: IV antibiotic, wound care, diuretics, need for rehabilitation. ? ? Family Communication:  ?Communicated with patient's friend on the phone  on 09/03/2021.   ? ?Consultants:  ?Wound care ? ?Procedures:  ?None ? ?Antimicrobials:  ?Vancomycin IV 4/17> ? ?Anti-infectives (From admission, onward)  ? ? Start     Dose/Rate Route Frequency Ordered Stop  ? 09/02/21 2100  vancomycin (VANCOREADY) IVPB 1250 mg/250 mL  Status:  Discontinued       ? 1,250 mg ?166.7 mL/hr over 90 Minutes Intravenous  Once 09/01/21 2201 09/02/21 0022  ? 09/02/21 2000  vancomycin (VANCOREADY) IVPB 1250 mg/250 mL       ? 1,250 mg ?166.7 mL/hr over 90 Minutes Intravenous Every 24 hours 09/02/21 0025    ? 09/01/21 2115  vancomycin (VANCOREADY) IVPB 2000 mg/400 mL       ? 2,000 mg ?200 mL/hr over 120 Minutes Intravenous  Once 09/01/21 2103 09/02/21 0016  ? ?  ? ?Subjective: ?Today, patient was seen and examined at bedside.  Continues to feel better with breathing.  Leg swelling has improved.  Denies any chest pain dizziness lightheadedness   ? ?Objective: ?Vitals:  ? 09/04/21 1951 09/05/21 0317 09/05/21 0500 09/05/21 0843  ?BP: (!) 147/63 (!) 138/57  133/62  ?Pulse: (!) 57 61  (!) 56  ?Resp: '17 17  18  '$ ?Temp: 98.1 ?F (36.7 ?C) 98.2 ?F (36.8 ?C)  98.1 ?F (36.7 ?C)  ?TempSrc: Oral Oral  Oral  ?SpO2: 96% 92%  91%  ?Weight:   114 kg   ?Height:      ? ? ?Intake/Output Summary (Last 24 hours) at 09/05/2021 1259 ?Last data filed at 09/05/2021 [id number] ?Gross per 24 hour  ?Intake 240 ml  ?Output 2600 ml  ?Net -2360 ml  ? ?Filed Weights  ? 09/03/21 0500 09/04/21 0500 09/05/21 0500  ?Weight: 116.9 kg 113.8 kg 114 kg  ? ? ?Physical Examination: ? ?General:  Average built, not in obvious distress on nasal cannula oxygen, elderly male, deconditioned ?HENT:   No scleral pallor or icterus noted. Oral mucosa is moist.  ?Chest:  .  Diminished breath sounds bilaterally.  ?CVS: S1 &S2 heard. No murmur.  Regular rate and rhythm. ?Abdomen: Soft, nontender, nondistended.  Bowel sounds are heard.  Bruise noted over the left chest wall. ?Extremities: No cyanosis, clubbing with lower  extremity edema, left lower extremity dressing. Peripheral pulses are palpable. ?Psych: Alert, awake and oriented, normal mood ?CNS:  No cranial nerve deficits.  Power equal in all extremities.   ?Skin: Warm and dry.  See extremities. ? ?Data Reviewed:  ? ?CBC: ?Recent Labs  ?Lab 09/01/21 ?1905 09/02/21 ?0248 09/03/21 ?0827 09/04/21 ?0241  ?WBC 15.8* 15.4* 5.2 5.3  ?NEUTROABS 13.4*  --   --   --   ?HGB 8.9* 9.7* 8.5* 8.3*  ?HCT 27.1* 29.8* 24.6* 24.8*  ?MCV 104.6* 106.4* 103.4* 104.2*  ?PLT 155 155 134* 147*  ? ? ?Basic Metabolic Panel: ?Recent Labs  ?Lab 09/01/21 ?1905 09/02/21 ?0248 09/02/21 ?0932 09/03/21 ?0827 09/04/21 ?1610 09/05/21 ?0104  ?NA 128* 131*  --  133* 135 133*  ?K 3.5 3.3*  --  3.0* 3.5 3.8  ?CL 94* 93*  --  95* 98 94*  ?CO2 26 27  --  30 31 32  ?GLUCOSE 102* 104*  --  109* 101* 100*  ?BUN 25* 21  --  '16 15 14  '$ ?CREATININE 1.21 1.18  --  0.87 0.89 0.97  ?CALCIUM 8.5* 8.7*  --  8.3* 8.5*  8.6*  ?MG  --   --  1.7  --  1.9  --   ? ? ?Liver Function Tests: ?No results for input(s): AST, ALT, ALKPHOS, BILITOT, PROT, ALBUMIN in the last 168 hours. ? ? ?Radiology Studies: ?No results found. ? ? ? LOS:

## 2021-09-05 NOTE — TOC Progression Note (Signed)
Transition of Care (TOC) - Progression Note  ? ? ?Patient Details  ?Name: [name] ?MRN: [id number] ?Date of Birth: 1933-02-09 ? ?Transition of Care (TOC) CM/SW Contact  ?Emeterio Reeve, LCSW ?Phone Number: ?09/05/2021, 8:51 AM ? ?Clinical Narrative:    ? ?Pts insurance Josem Kaufmann is still pending.  ? ?Expected Discharge Plan: [place] ?Barriers to Discharge: Continued Medical Work up, Ship broker ? ?Expected Discharge Plan and Services ?Expected Discharge Plan: [place] ?  ?  ?  ?Living arrangements for the past 2 months: [place] ?                ?  ?  ?  ?  ?  ?  ?  ?  ?  ?  ? ? ?Social Determinants of Health (SDOH) Interventions ?  ? ?Readmission Risk Interventions ?   ? View : No data to display.  ?  ?  ?  ? ?Emeterio Reeve, LCSW ?Clinical Social Worker ? ?

## 2021-09-05 NOTE — Care Management Important Message (Signed)
Important Message ? ?Patient Details  ?Name: [name] ?MRN: [id number] ?Date of Birth: Nov 22, 1932 ? ? ?Medicare Important Message Given:  Yes ? ? ? ? ?[place] Luppino ?09/05/2021, 3:18 PM ?

## 2021-09-05 NOTE — Progress Notes (Signed)
Mobility Specialist Progress Note: ? ? 09/05/21 1327  ?Mobility  ?Activity Ambulated with assistance in room  ?Level of Assistance Minimal assist, patient does 75% or more  ?Assistive Device Front wheel walker  ?Distance Ambulated (ft) 6 ft  ?Activity Response Tolerated well  ?$Mobility charge 1 Mobility  ? ?Pt received wanting to go back to bed. No complaints of pain. MinA+2 to stand then contact guard to step to bed. Left in bed with call bell in reach and all needs met.  ? ?[name] ?Mobility Specialist ?Primary Phone [phone] ? ?

## 2021-09-05 NOTE — Progress Notes (Signed)
Mobility Specialist Progress Note: ? ? 09/05/21 1030  ?Mobility  ?Activity Ambulated with assistance in room;Transferred to/from [facility]  ?Level of Assistance Minimal assist, patient does 75% or more  ?Assistive Device Front wheel walker  ?Distance Ambulated (ft) 6 ft  ?Activity Response Tolerated well  ?$Mobility charge 1 Mobility  ? ?Pt received EOB needing to use BSC. MinA +2 to transfer to [facility]. Pt then required minA+2 to step to recliner. Left in chair with call bell in reach and all needs met.  ? ?Aden Sek ?Mobility Specialist ?Primary Phone [phone] ? ?

## 2021-09-06 DIAGNOSIS — L120 Bullous pemphigoid: Secondary | ICD-10-CM | POA: Diagnosis not present

## 2021-09-06 DIAGNOSIS — L03119 Cellulitis of unspecified part of limb: Secondary | ICD-10-CM | POA: Diagnosis not present

## 2021-09-06 DIAGNOSIS — I5033 Acute on chronic diastolic (congestive) heart failure: Secondary | ICD-10-CM | POA: Diagnosis not present

## 2021-09-06 DIAGNOSIS — J45909 Unspecified asthma, uncomplicated: Secondary | ICD-10-CM | POA: Diagnosis not present

## 2021-09-06 MED ORDER — ACETAMINOPHEN 325 MG PO TABS
650.0000 mg | ORAL_TABLET | Freq: Once | ORAL | Status: DC
  Filled 2021-09-06: qty 2

## 2021-09-06 NOTE — Plan of Care (Signed)

## 2021-09-06 NOTE — Progress Notes (Signed)
?PROGRESS NOTE ? ? ? ?[name]  [name] DOB: 29-Mar-1933 DOA: 09/01/2021 ?PCP: [facility]  ? ? ?Brief Narrative:  ?[name] is a 88 y.o. male with medical history significant of chronic diastolic CHF, hypertension, BPH, anxiety, bullous pemphigoid, asthma, hyperlipidemia, chronic hyponatremia presented to the hospital this time with generalized weakness and low oxygen.  He reported a fall 10 days back with pain over the left anterior lower ribs.  Patient also had left lower extremity tear and patient was given  doxycycline for leg infection recently.  Patient now reported increasing generalized weakness to the point that he has been using wheelchair at home.  Of note, patient was admitted last month for hypotension, AKI, right lower extremity cellulitis, and UTI.  On this presentation, patient had mild leukocytosis at 15.8, hemoglobin of 8.9, elevated MCV,  Sodium 128 (baseline low 130s), potassium 3.5, chloride 94, bicarb 26, BUN 25, creatinine 1.2, glucose 102.  Urine dipstick showing negative nitrite, trace leukocytes.  BNP 342.  High-sensitivity troponin negative x2.  COVID and influenza PCR negative.  Chest x-ray showing no active cardiopulmonary disease.  Patient was then admitted to the hospital for further evaluation and treatment. ? ?Assessment and plan ? ?Principal Problem: ?  Cellulitis ?Active Problems: ?  Hypertension ?  BPH (benign prostatic hyperplasia) ?  Asthma, chronic ?  Hyponatremia ?  Bullous pemphigoid ?  Acute on chronic diastolic CHF (congestive heart failure) ([place]) ?  Hypoxia ?  Hypokalemia ?  ?Cellulitis of lower legs, left leg skin tear with flap. ?Currently on IV vancomycin.  Continue wound care.  Patient had undergone debridement by plastic surgery Dr. Claudia Desanctis.  Will change to oral antibiotic on discharge.  ? ?Acute on chronic diastolic CHF ?With gross bilateral lower extremity edema.  2D echocardiogram from February 2023 with normal systolic  function and grade 2 diastolic dysfunction.  On Lasix 40 mg IV twice daily.  Latest creatinine at 0.9.  Continue strict intake and output charting, daily weights and low-salt diet.  Patient is negative balance for 8179 ml at this time with a  approx 7 pound weight loss..  We will continue to monitor.  Patient takes 40 mg oral Lasix twice daily at home.  Will transition to oral Lasix on discharge. ? ?Hypokalemia.  Improved after replacement.  Latest potassium was 3.8. ? ?Bilateral lower extremity edema ?Likely secondary to chronic venous stasis and decompensated heart failure.  Bilateral lower extremity ultrasound was negative for DVT.  Lower extremity edema has improved with diuretics.. ?  ?Hypoxia ?Requiring supplemental oxygen at 2 L/min.  Could be exacerbated by rib pain.  Chest x-ray without acute findings.  COVID and influenza negative.  Troponins negative.   Looks like patient was using as needed oxygen 1 L at home. ?  ?Rib pain/ Abdominal bruising ?Status post fall.  Continue pain management with tramadol, gabapentin.   ?  ?Abnormal urinalysis ?No urinary symptoms.  No indication for antibiotic.  Was recently treated with antibiotic ? ?Chronic macrocytic anemia ?Hemoglobin 8.9 on presentation., baseline 11-12.  Continue folic acid from home.  Folic acid level at 26, vitamin B12 420.  Latest hemoglobin of 8.3. ? ?,Acute on chronic hyponatremia ?Likely due to hypervolemia.  Sodium 128 on presentation., baseline in the low 130s. Continue IV diuresis.  Latest sodium at 133 ?  ?Hypertension ?Continue clonidine and metoprolol ?  ?BPH ?-Continue finasteride ?  ?Anxiety ?-Continue home Ativan as needed ?  ?Bullous pemphigoid ?-Hold methotrexate given  concern for active infection ?  ?Asthma ?Continue Singulair and albuterol ? ?Generalized weakness, deconditioning, debility ?Physical therapy recommended skilled nursing facility placement.  ? ? DVT prophylaxis:  ? ? Lovenox subcu ?Code Status:   ?  Code Status:  DNR ? ?Disposition: Skilled nursing facility when bed available.  Patient is medically stable for disposition.   ? ?Status is: Inpatient ? ?Remains inpatient appropriate because: IV antibiotic, wound care, diuretics, need for rehabilitation. ? ? Family Communication:  ?Spoke with the patient's other friend at bedside today. ? ?Consultants:  ?Wound care ? ?Procedures:  ?None ? ?Antimicrobials:  ?Vancomycin IV 4/17> ? ?Anti-infectives (From admission, onward)  ? ? Start     Dose/Rate Route Frequency Ordered Stop  ? 09/02/21 2100  vancomycin (VANCOREADY) IVPB 1250 mg/250 mL  Status:  Discontinued       ? 1,250 mg ?166.7 mL/hr over 90 Minutes Intravenous  Once 09/01/21 2201 09/02/21 0022  ? 09/02/21 2000  vancomycin (VANCOREADY) IVPB 1250 mg/250 mL       ? 1,250 mg ?166.7 mL/hr over 90 Minutes Intravenous Every 24 hours 09/02/21 0025    ? 09/01/21 2115  vancomycin (VANCOREADY) IVPB 2000 mg/400 mL       ? 2,000 mg ?200 mL/hr over 120 Minutes Intravenous  Once 09/01/21 2103 09/02/21 0016  ? ?  ? ?Subjective: ?Today, patient was seen and examined at bedside.  Feels frustrated about being in the hospital.  Complaining that he has not been attended.  Feels better with breathing.  Leg swelling has gone down.  Denies overt pain.  ? ?Objective: ?Vitals:  ? 09/05/21 1939 09/06/21 0445 09/06/21 0500 09/06/21 0742  ?BP: (!) 137/55 (!) 163/71  (!) 154/74  ?Pulse: 60 (!) 59  (!) 51  ?Resp: '18 19  18  '$ ?Temp: 98.3 ?F (36.8 ?C) 98.3 ?F (36.8 ?C)  98 ?F (36.7 ?C)  ?TempSrc: Oral Oral  Oral  ?SpO2: 92% 95%  92%  ?Weight:   113.7 kg   ?Height:      ? ? ?Intake/Output Summary (Last 24 hours) at 09/06/2021 1036 ?Last data filed at 09/06/2021 0445 ?Gross per 24 hour  ?Intake 120 ml  ?Output 1500 ml  ?Net -1380 ml  ? ?Filed Weights  ? 09/04/21 0500 09/05/21 0500 09/06/21 0500  ?Weight: 113.8 kg 114 kg 113.7 kg  ? ? ?Physical Examination: ? ?General:  Average built, not in obvious distress, on nasal cannula oxygen, elderly male,  deconditioned. ?HENT:   No scleral pallor or icterus noted. Oral mucosa is moist.  ?Chest:    Diminished breath sounds bilaterally. No crackles or wheezes.  ?CVS: S1 &S2 heard. No murmur.  Regular rate and rhythm. ?Abdomen: Soft, nontender, nondistended.  Bowel sounds are heard.   ?Extremities: No cyanosis, clubbing bilateral lower extremity edema erythema.  Lower extremity wrapped.  ?Psych: Alert, awake and oriented, normal mood ?CNS:  No cranial nerve deficits.  Moves all extremities  ?Skin: Warm and dry.  No rashes noted. ? ? ?Data Reviewed:  ? ?CBC: ?Recent Labs  ?Lab 09/01/21 ?1905 09/02/21 ?0248 09/03/21 ?0827 09/04/21 ?0241  ?WBC 15.8* 15.4* 5.2 5.3  ?NEUTROABS 13.4*  --   --   --   ?HGB 8.9* 9.7* 8.5* 8.3*  ?HCT 27.1* 29.8* 24.6* 24.8*  ?MCV 104.6* 106.4* 103.4* 104.2*  ?PLT 155 155 134* 147*  ? ? ?Basic Metabolic Panel: ?Recent Labs  ?Lab 09/01/21 ?1905 09/02/21 ?0248 09/02/21 ?0932 09/03/21 ?0827 09/04/21 ?7026 09/05/21 ?0104  ?NA 128* 131*  --  133* 135 133*  ?K 3.5 3.3*  --  3.0* 3.5 3.8  ?CL 94* 93*  --  95* 98 94*  ?CO2 26 27  --  30 31 32  ?GLUCOSE 102* 104*  --  109* 101* 100*  ?BUN 25* 21  --  '16 15 14  '$ ?CREATININE 1.21 1.18  --  0.87 0.89 0.97  ?CALCIUM 8.5* 8.7*  --  8.3* 8.5* 8.6*  ?MG  --   --  1.7  --  1.9  --   ? ? ?Liver Function Tests: ?No results for input(s): AST, ALT, ALKPHOS, BILITOT, PROT, ALBUMIN in the last 168 hours. ? ? ?Radiology Studies: ?No results found. ? ? ? LOS: 4 days  ? ? ?Flora Lipps, MD ?[facility] ?Available via Epic secure chat 7am-7pm ?After these hours, please refer to coverage provider listed on [email or web address] ?09/06/2021, 10:36 AM  ?  ?

## 2021-09-07 DIAGNOSIS — N400 Enlarged prostate without lower urinary tract symptoms: Secondary | ICD-10-CM | POA: Diagnosis not present

## 2021-09-07 DIAGNOSIS — J45909 Unspecified asthma, uncomplicated: Secondary | ICD-10-CM | POA: Diagnosis not present

## 2021-09-07 DIAGNOSIS — L03116 Cellulitis of left lower limb: Secondary | ICD-10-CM | POA: Diagnosis not present

## 2021-09-07 DIAGNOSIS — I5033 Acute on chronic diastolic (congestive) heart failure: Secondary | ICD-10-CM | POA: Diagnosis not present

## 2021-09-07 LAB — BASIC METABOLIC PANEL
Anion gap: 8 (ref 5–15)
BUN: 13 mg/dL (ref 8–23)
CO2: 32 mmol/L (ref 22–32)
Calcium: 9.1 mg/dL (ref 8.9–10.3)
Chloride: 95 mmol/L — ABNORMAL LOW (ref 98–111)
Creatinine, Ser: 0.89 mg/dL (ref 0.61–1.24)
GFR, Estimated: >60 mL/min (ref >60)
Glucose, Bld: 102 mg/dL — ABNORMAL HIGH (ref 70–99)
Potassium: 3.8 mmol/L (ref 3.5–5.1)
Sodium: 135 mmol/L (ref 135–145)

## 2021-09-07 LAB — CBC
HCT: 28.3 % — ABNORMAL LOW (ref 39.0–52.0)
Hemoglobin: 9.3 g/dL — ABNORMAL LOW (ref 13.0–17.0)
MCH: 33.9 pg (ref 26.0–34.0)
MCHC: 32.9 g/dL (ref 30.0–36.0)
MCV: 103.3 fL — ABNORMAL HIGH (ref 80.0–100.0)
Platelets: 196 10*3/uL (ref 150–400)
RBC: 2.74 MIL/uL — ABNORMAL LOW (ref 4.22–5.81)
RDW: 14.6 % (ref 11.5–15.5)
WBC: 7.2 10*3/uL (ref 4.0–10.5)
nRBC: 0 % (ref 0.0–0.2)

## 2021-09-07 LAB — MAGNESIUM: Magnesium: 2.1 mg/dL (ref 1.7–2.4)

## 2021-09-07 MED ORDER — HYDROCORTISONE 1 % EX CREA
1.0000 "application " | TOPICAL_CREAM | Freq: Three times a day (TID) | CUTANEOUS | Status: DC | PRN
  Filled 2021-09-07: qty 28

## 2021-09-07 NOTE — Progress Notes (Addendum)
?PROGRESS NOTE ? ? ? ?[name]  GNF:[id number] DOB: 1933/04/14 DOA: 09/01/2021 ?PCP: [facility]  ? ? ?Brief Narrative:  ?[name] is a 88 y.o. male with medical history significant of chronic diastolic CHF, hypertension, BPH, anxiety, bullous pemphigoid, asthma, hyperlipidemia, chronic hyponatremia presented to the hospital this time with generalized weakness and low oxygen.  He reported a fall 10 days back with pain over the left anterior lower ribs.  Patient also had left lower extremity tear and patient was given  doxycycline for leg infection recently.  Patient now reported increasing generalized weakness to the point that he has been using wheelchair at home.  Of note, patient was admitted last month for hypotension, AKI, right lower extremity cellulitis, and UTI.  On this presentation, patient had mild leukocytosis at 15.8, hemoglobin of 8.9, elevated MCV,  Sodium 128 (baseline low 130s), potassium 3.5, chloride 94, bicarb 26, BUN 25, creatinine 1.2, glucose 102.  Urine dipstick showing negative nitrite, trace leukocytes.  BNP 342.  High-sensitivity troponin negative x2.  COVID and influenza PCR negative.  Chest x-ray showing no active cardiopulmonary disease.  Patient was then admitted to the hospital for further evaluation and treatment. ? ?Assessment and plan ? ?Principal Problem: ?  Cellulitis ?Active Problems: ?  Hypertension ?  BPH (benign prostatic hyperplasia) ?  Asthma, chronic ?  Hyponatremia ?  Bullous pemphigoid ?  Acute on chronic diastolic CHF (congestive heart failure) ([place]) ?  Hypoxia ?  Hypokalemia ?  ?Cellulitis of lower legs, left leg skin tear with flap. ? on IV vancomycin.  Continue wound care.  Patient had undergone debridement by plastic surgery Dr. Claudia Desanctis.  Will change to oral antibiotic on discharge.  ? ?Acute on chronic diastolic CHF ?With gross bilateral lower extremity edema.  2D echocardiogram from February 2023 with normal systolic function  and grade 2 diastolic dysfunction.  On Lasix 40 mg IV twice daily.  Latest creatinine at 0.9.  Continue strict intake and output charting, daily weights and low-salt diet.  Patient is negative balance for 12039 ml at this time with a  approx 7 pound weight loss..  We will continue to monitor.  Patient takes 40 mg oral Lasix twice daily at home.  Will transition to oral Lasix on discharge/tomorrow.. ? ?Hypokalemia.  Improved after replacement.  Latest potassium was 3.8. ? ?Bilateral lower extremity edema ?Likely secondary to chronic venous stasis and decompensated heart failure.  Bilateral lower extremity ultrasound was negative for DVT.  Lower extremity edema has improved with diuretics.  Will change IV Lasix to oral starting tomorrow. ?  ?Hypoxia ? Could be exacerbated by rib pain.  Chest x-ray without acute findings.  COVID and influenza negative.  Troponins negative.   Looks like patient was using as needed oxygen 1 L at home.  Currently on room air. ?  ?Rib pain/ Abdominal bruising ?Status post fall.  Continue pain management with tramadol, gabapentin.   ?  ?Abnormal urinalysis ?No urinary symptoms.  No indication for antibiotic.  Was recently treated with antibiotic ? ?Chronic macrocytic anemia ?Hemoglobin 8.9 on presentation., baseline 11-12.  Continue folic acid from home.  Folic acid level at 26, vitamin B12 420.  Latest hemoglobin of 9.3 ? ?,Acute on chronic hyponatremia ?Likely due to hypervolemia.  Sodium 128 on presentation.,  Sodium level has improved to 135 at this time. ? ?Hypertension ?Continue clonidine and metoprolol ?  ?BPH ?-Continue finasteride ?  ?Anxiety ?-Continue home Ativan as needed ?  ?Bullous  pemphigoid ?-Hold methotrexate given concern for active infection ?  ?Asthma ?Continue Singulair and albuterol ? ?Generalized weakness, deconditioning, debility ?Physical therapy has recommended skilled nursing facility placement.  ? ? DVT prophylaxis:  ? ? Lovenox subcu ?Code Status:   ?  Code  Status: DNR ? ?Disposition: Skilled nursing facility when bed available.  Patient is medically stable for disposition.   ? ?Status is: Inpatient ? ?Remains inpatient appropriate because: IV antibiotic, wound care, diuretics, need for rehabilitation. ? ? Family Communication:  ?Spoke with the patient's other friend at bedside on 09/06/2021 ? ?Consultants:  ?Wound care ? ?Procedures:  ?None ? ?Antimicrobials:  ?Vancomycin IV 4/17> ? ?Anti-infectives (From admission, onward)  ? ? Start     Dose/Rate Route Frequency Ordered Stop  ? 09/02/21 2100  vancomycin (VANCOREADY) IVPB 1250 mg/250 mL  Status:  Discontinued       ? 1,250 mg ?166.7 mL/hr over 90 Minutes Intravenous  Once 09/01/21 2201 09/02/21 0022  ? 09/02/21 2000  vancomycin (VANCOREADY) IVPB 1250 mg/250 mL       ? 1,250 mg ?166.7 mL/hr over 90 Minutes Intravenous Every 24 hours 09/02/21 0025    ? 09/01/21 2115  vancomycin (VANCOREADY) IVPB 2000 mg/400 mL       ? 2,000 mg ?200 mL/hr over 120 Minutes Intravenous  Once 09/01/21 2103 09/02/21 0016  ? ?  ? ?Subjective: ?Today, patient was seen and examined at bedside.  Patient denies interval complaints.  Denies any chest pain or shortness of breath fever chills or rigor. ? ?Objective: ?Vitals:  ? 09/06/21 2017 09/07/21 0448 09/07/21 0455 09/07/21 9935  ?BP: (!) 117/55 140/69  137/65  ?Pulse: (!) 57 (!) 55  (!) 56  ?Resp: '18 18  18  '$ ?Temp: 97.6 ?F (36.4 ?C) 98 ?F (36.7 ?C)  98.2 ?F (36.8 ?C)  ?TempSrc: Oral Oral  Oral  ?SpO2: (!) 88% 91%  90%  ?Weight:   114 kg   ?Height:      ? ? ?Intake/Output Summary (Last 24 hours) at 09/07/2021 1137 ?Last data filed at 09/07/2021 0450 ?Gross per 24 hour  ?Intake 120 ml  ?Output 2400 ml  ?Net -2280 ml  ? ?Filed Weights  ? 09/05/21 0500 09/06/21 0500 09/07/21 0455  ?Weight: 114 kg 113.7 kg 114 kg  ? ? ?Physical Examination: ?Body mass index is 36.06 kg/m?.  ?General: Obese built, not in obvious distress, elderly male, deconditioned ?HENT:   No scleral pallor or icterus noted. Oral  mucosa is moist.  ?Chest:  Clear breath sounds.  Diminished breath sounds bilaterally. No crackles or wheezes.  ?CVS: S1 &S2 heard. No murmur.  Regular rate and rhythm. ?Abdomen: Soft, nontender, nondistended.  Bowel sounds are heard.   ?Extremities: No cyanosis, clubbing with edema of the lower extremities, ulceration with dressing..  Peripheral pulses are palpable. ?Psych: Alert, awake and oriented, normal mood ?CNS:  No cranial nerve deficits.  Moves all extremities ?Skin: Warm and dry.  See lower extremity findings. ? ?Data Reviewed:  ? ?CBC: ?Recent Labs  ?Lab 09/01/21 ?1905 09/02/21 ?0248 09/03/21 ?0827 09/04/21 ?0241 09/07/21 ?0053  ?WBC 15.8* 15.4* 5.2 5.3 7.2  ?NEUTROABS 13.4*  --   --   --   --   ?HGB 8.9* 9.7* 8.5* 8.3* 9.3*  ?HCT 27.1* 29.8* 24.6* 24.8* 28.3*  ?MCV 104.6* 106.4* 103.4* 104.2* 103.3*  ?PLT 155 155 134* 147* 196  ? ? ?Basic Metabolic Panel: ?Recent Labs  ?Lab 09/02/21 ?0248 09/02/21 ?0932 09/03/21 ?0827 09/04/21 ?7017 09/05/21 ?7939  09/07/21 ?0053  ?NA 131*  --  133* 135 133* 135  ?K 3.3*  --  3.0* 3.5 3.8 3.8  ?CL 93*  --  95* 98 94* 95*  ?CO2 27  --  30 31 32 32  ?GLUCOSE 104*  --  109* 101* 100* 102*  ?BUN 21  --  '16 15 14 13  '$ ?CREATININE 1.18  --  0.87 0.89 0.97 0.89  ?CALCIUM 8.7*  --  8.3* 8.5* 8.6* 9.1  ?MG  --  1.7  --  1.9  --  2.1  ? ? ?Liver Function Tests: ?No results for input(s): AST, ALT, ALKPHOS, BILITOT, PROT, ALBUMIN in the last 168 hours. ? ? ?Radiology Studies: ?No results found. ? ? ? LOS: 5 days  ? ? ?Flora Lipps, MD ?[facility] ?Available via Epic secure chat 7am-7pm ?After these hours, please refer to coverage provider listed on [email or web address] ?09/07/2021, 11:37 AM  ?  ?

## 2021-09-08 DIAGNOSIS — R296 Repeated falls: Secondary | ICD-10-CM | POA: Diagnosis not present

## 2021-09-08 DIAGNOSIS — Z7401 Bed confinement status: Secondary | ICD-10-CM | POA: Diagnosis not present

## 2021-09-08 DIAGNOSIS — R531 Weakness: Secondary | ICD-10-CM | POA: Diagnosis not present

## 2021-09-08 DIAGNOSIS — M6281 Muscle weakness (generalized): Secondary | ICD-10-CM | POA: Diagnosis not present

## 2021-09-08 DIAGNOSIS — L0390 Cellulitis, unspecified: Secondary | ICD-10-CM | POA: Diagnosis not present

## 2021-09-08 DIAGNOSIS — F064 Anxiety disorder due to known physiological condition: Secondary | ICD-10-CM | POA: Diagnosis not present

## 2021-09-08 DIAGNOSIS — L03115 Cellulitis of right lower limb: Secondary | ICD-10-CM | POA: Diagnosis not present

## 2021-09-08 DIAGNOSIS — E871 Hypo-osmolality and hyponatremia: Secondary | ICD-10-CM | POA: Diagnosis not present

## 2021-09-08 DIAGNOSIS — I5032 Chronic diastolic (congestive) heart failure: Secondary | ICD-10-CM | POA: Diagnosis not present

## 2021-09-08 DIAGNOSIS — R0902 Hypoxemia: Secondary | ICD-10-CM | POA: Diagnosis not present

## 2021-09-08 DIAGNOSIS — F4322 Adjustment disorder with anxiety: Secondary | ICD-10-CM | POA: Diagnosis not present

## 2021-09-08 DIAGNOSIS — D649 Anemia, unspecified: Secondary | ICD-10-CM | POA: Diagnosis not present

## 2021-09-08 DIAGNOSIS — S81801A Unspecified open wound, right lower leg, initial encounter: Secondary | ICD-10-CM | POA: Diagnosis not present

## 2021-09-08 DIAGNOSIS — I10 Essential (primary) hypertension: Secondary | ICD-10-CM | POA: Diagnosis not present

## 2021-09-08 DIAGNOSIS — L120 Bullous pemphigoid: Secondary | ICD-10-CM | POA: Diagnosis not present

## 2021-09-08 DIAGNOSIS — R278 Other lack of coordination: Secondary | ICD-10-CM | POA: Diagnosis not present

## 2021-09-08 DIAGNOSIS — N390 Urinary tract infection, site not specified: Secondary | ICD-10-CM | POA: Diagnosis not present

## 2021-09-08 DIAGNOSIS — J45909 Unspecified asthma, uncomplicated: Secondary | ICD-10-CM | POA: Diagnosis not present

## 2021-09-08 DIAGNOSIS — R2689 Other abnormalities of gait and mobility: Secondary | ICD-10-CM | POA: Diagnosis not present

## 2021-09-08 DIAGNOSIS — N179 Acute kidney failure, unspecified: Secondary | ICD-10-CM | POA: Diagnosis not present

## 2021-09-08 DIAGNOSIS — N400 Enlarged prostate without lower urinary tract symptoms: Secondary | ICD-10-CM | POA: Diagnosis not present

## 2021-09-08 DIAGNOSIS — R2681 Unsteadiness on feet: Secondary | ICD-10-CM | POA: Diagnosis not present

## 2021-09-08 DIAGNOSIS — E876 Hypokalemia: Secondary | ICD-10-CM | POA: Diagnosis not present

## 2021-09-08 DIAGNOSIS — L03119 Cellulitis of unspecified part of limb: Secondary | ICD-10-CM | POA: Diagnosis not present

## 2021-09-08 DIAGNOSIS — I5033 Acute on chronic diastolic (congestive) heart failure: Secondary | ICD-10-CM | POA: Diagnosis not present

## 2021-09-08 DIAGNOSIS — E875 Hyperkalemia: Secondary | ICD-10-CM | POA: Diagnosis not present

## 2021-09-08 DIAGNOSIS — J9601 Acute respiratory failure with hypoxia: Secondary | ICD-10-CM | POA: Diagnosis not present

## 2021-09-08 LAB — CBC
HCT: 29.9 % — ABNORMAL LOW (ref 39.0–52.0)
Hemoglobin: 9.7 g/dL — ABNORMAL LOW (ref 13.0–17.0)
MCH: 33.7 pg (ref 26.0–34.0)
MCHC: 32.4 g/dL (ref 30.0–36.0)
MCV: 103.8 fL — ABNORMAL HIGH (ref 80.0–100.0)
Platelets: 222 10*3/uL (ref 150–400)
RBC: 2.88 MIL/uL — ABNORMAL LOW (ref 4.22–5.81)
RDW: 14.8 % (ref 11.5–15.5)
WBC: 8.6 10*3/uL (ref 4.0–10.5)
nRBC: 0 % (ref 0.0–0.2)

## 2021-09-08 LAB — BASIC METABOLIC PANEL
Anion gap: 8 (ref 5–15)
BUN: 14 mg/dL (ref 8–23)
CO2: 30 mmol/L (ref 22–32)
Calcium: 9 mg/dL (ref 8.9–10.3)
Chloride: 94 mmol/L — ABNORMAL LOW (ref 98–111)
Creatinine, Ser: 1.02 mg/dL (ref 0.61–1.24)
GFR, Estimated: >60 mL/min (ref >60)
Glucose, Bld: 117 mg/dL — ABNORMAL HIGH (ref 70–99)
Potassium: 4.1 mmol/L (ref 3.5–5.1)
Sodium: 132 mmol/L — ABNORMAL LOW (ref 135–145)

## 2021-09-08 LAB — MAGNESIUM: Magnesium: 2.3 mg/dL (ref 1.7–2.4)

## 2021-09-08 MED ORDER — LORAZEPAM 0.5 MG PO TABS: 0.5000 mg | ORAL_TABLET | Freq: Four times a day (QID) | ORAL | 0 refills | Status: AC | PRN

## 2021-09-08 MED ORDER — TRAMADOL HCL 50 MG PO TABS: 50.0000 mg | ORAL_TABLET | Freq: Four times a day (QID) | ORAL | 0 refills | Status: AC | PRN

## 2021-09-08 MED ORDER — DOXYCYCLINE HYCLATE 100 MG PO TABS: 100.0000 mg | ORAL_TABLET | Freq: Two times a day (BID) | ORAL | 0 refills | Status: AC

## 2021-09-08 MED ORDER — MELATONIN 3 MG PO TABS: 3.0000 mg | ORAL_TABLET | Freq: Every evening | ORAL | 0 refills | Status: AC | PRN

## 2021-09-08 MED ORDER — SENNA 8.6 MG PO TABS: 1.0000 | ORAL_TABLET | Freq: Every day | ORAL | 0 refills | Status: AC

## 2021-09-08 MED ORDER — ALBUTEROL SULFATE HFA 108 (90 BASE) MCG/ACT IN AERS: 2.0000 | INHALATION_SPRAY | Freq: Four times a day (QID) | RESPIRATORY_TRACT | Status: AC | PRN

## 2021-09-08 NOTE — Discharge Summary (Signed)
?Physician Discharge Summary ?  ?Patient: [name] MRN: [id number] DOB: Aug 01, 1932  ?Admit date:     09/01/2021  ?Discharge date: 09/08/21  ?Discharge Physician: Corrie Mckusick Otisha Spickler  ? ?PCP: [facility]  ? ?Recommendations at discharge:  ? ?Follow-up with your primary care provider at the skilled nursing facility in 3 to 5 days. ? Check CBC BMP magnesium and liver function test in the next visit. ?Patient will continue to need wound care dressing at the facility as ordered. ?Heart failure instructions to be followed including daily weights, fluid restriction and low-salt diet. ? ?Discharge Diagnoses: ?Principal Problem: ?  Cellulitis ?Active Problems: ?  Hypertension ?  BPH (benign prostatic hyperplasia) ?  Asthma, chronic ?  Hyponatremia ?  Bullous pemphigoid ?  Acute on chronic diastolic CHF (congestive heart failure) ([place]) ?  Hypoxia ?  Hypokalemia ? ?Resolved Problems: ?  * No resolved hospital problems. * ? ?Hospital Course: ?[name] is a 88 y.o. male with medical history significant of chronic diastolic CHF, hypertension, BPH, anxiety, bullous pemphigoid, asthma, hyperlipidemia, chronic hyponatremia presented to the hospital this time with generalized weakness and low oxygen.  He reported a fall 10 days back with pain over the left anterior lower ribs.  Patient also had left lower extremity tear and patient was given  doxycycline for leg infection recently.  Patient now reported increasing generalized weakness to the point that he has been using wheelchair at home.  Of note, patient was admitted last month for hypotension, AKI, right lower extremity cellulitis, and UTI.  On this presentation, patient had mild leukocytosis at 15.8, hemoglobin of 8.9, elevated MCV,  Sodium 128 (baseline low 130s), potassium 3.5, chloride 94, bicarb 26, BUN 25, creatinine 1.2, glucose 102.  Urine dipstick showing negative nitrite, trace leukocytes.  BNP 342.  High-sensitivity troponin negative  x2.  COVID and influenza PCR negative.  Chest x-ray showed no active cardiopulmonary disease.  Patient was then admitted to the hospital for further evaluation and treatment. ? ?Assessment and plan ? ?Principal Problem: ?  Cellulitis ?Active Problems: ?  Hypertension ?  BPH (benign prostatic hyperplasia) ?  Asthma, chronic ?  Hyponatremia ?  Bullous pemphigoid ?  Acute on chronic diastolic CHF (congestive heart failure) ([place]) ?  Hypoxia ?  Hypokalemia ?  ?Cellulitis of lower legs, left leg skin tear with flap. ?Patient received IV vancomycin.  Patient was seen by wound care and plastic surgery Dr. Claudia Desanctis.  He underwent debridement at bedside by plastic surgery Dr. Claudia Desanctis.  Please continue wound dressing on discharge.  We will continue oral doxycycline for next 3 days to complete the course of antibiotic ? ?Acute on chronic diastolic CHF ?With gross bilateral lower extremity edema on presentation.  Patient has significantly diuresed with IV Lasix twice a day during hospitalization.  Edema has significantly improved..  2D echocardiogram from February 2023 with normal systolic function and grade 2 diastolic dysfunction.  .  Latest creatinine at 1.02.  Would recommend daily weights, low-salt diet, fluid restriction 1500 MLS per day on discharge.  Patient will be resumed on home Lasix regimen with Lasix orally 40 twice daily.  Patient was negative balance for 14 L prior to discharge. ? ?Hypokalemia.  Improved after replacement.  Patient prior to discharge was 4.1.  Will need monitoring in the next visit. ? ?Bilateral lower extremity edema ?Likely secondary to chronic venous stasis and decompensated heart failure.  Significantly improved after diuresis.  Bilateral lower extremity ultrasound was negative for  DVT.  We will change this to oral Lasix on discharge. ? ?Hypoxia ? Chest x-ray without acute findings.  COVID and influenza negative.  Troponins negative.   Looks like patient was using as needed oxygen 1 L at home.  ?   ?Rib pain/ Abdominal bruising ?Status post fall.  Continue pain management with tramadol, gabapentin.  Denies active chest pain at this time. ?  ?Abnormal urinalysis ?No urinary symptoms.  No indication for antibiotic.  Was recently treated with antibiotic ? ?Chronic macrocytic anemia ?Hemoglobin 8.9 on presentation., baseline 11-12.  Continue folic acid from home.  Folic acid level at 26, vitamin B12 420.  Latest hemoglobin of 9.7 ? ?,Acute on chronic hyponatremia ?Likely due to hypervolemia.  Sodium 128 on presentation.,  Sodium level has improved to 132 at this time.  Will need monitoring of BMP as outpatient on diuretics. ? ?Hypertension ?Continue clonidine and metoprolol ?  ?BPH ?-Continue finasteride ?  ?Anxiety ?-Continue home Ativan as needed ?  ?Bullous pemphigoid ?Resume methotrexate on discharge. ?  ?Asthma ?Continue Singulair and albuterol. ? ?Generalized weakness, deconditioning, debility ?Physical therapy has recommended skilled nursing facility placement at this time.  Patient lives by himself at home. ? ?Consultants: None ? ?Procedures performed: None ? ?Disposition: Skilled nursing facility ? ?Diet recommendation:  ?Discharge Diet Orders (From admission, onward)  ? ?  Start     Ordered  ? 09/08/21 0000  Diet - low sodium heart healthy       ?Comments: Low-sodium diet, fluid restriction 1500 MLS per day.  ? 09/08/21 1009  ? ?  ?  ? ?  ? ?Cardiac diet ?DISCHARGE MEDICATION: ?Allergies as of 09/08/2021   ? ?   Reactions  ? Spironolactone Nausea Only  ? Hydrocodone Nausea And Vomiting  ? ?  ? ?  ?Medication List  ?  ? ?TAKE these medications   ? ?albuterol 108 (90 Base) MCG/ACT inhaler ?Commonly known as: VENTOLIN HFA ?Inhale 2 puffs into the lungs every 6 (six) hours as needed for wheezing or shortness of breath. ?  ?CENTRUM SILVER 50+MEN PO ?Take 1 tablet by mouth daily. ?  ?cloNIDine 0.2 MG tablet ?Commonly known as: CATAPRES ?Take 0.2 mg by mouth 2 (two) times daily. ?What changed: Another  medication with the same name was removed. Continue taking this medication, and follow the directions you see here. ?  ?diclofenac 75 MG EC tablet ?Commonly known as: VOLTAREN ?Take 75 mg by mouth 2 (two) times daily. ?  ?doxycycline 100 MG tablet ?Commonly known as: VIBRA-TABS ?Take 1 tablet (100 mg total) by mouth 2 (two) times daily for 3 days. ?  ?eplerenone 25 MG tablet ?Commonly known as: INSPRA ?Take 25 mg by mouth daily. ?  ?eucerin cream ?Apply to back and other affected area of skin  twice a day ?  ?finasteride 5 MG tablet ?Commonly known as: PROSCAR ?Take 5 mg by mouth daily. ?  ?folic acid 1 MG tablet ?Commonly known as: FOLVITE ?Take 1 mg by mouth every morning. ?  ?furosemide 40 MG tablet ?Commonly known as: LASIX ?Take 40 mg by mouth 2 (two) times daily. ?What changed: Another medication with the same name was removed. Continue taking this medication, and follow the directions you see here. ?  ?gabapentin 300 MG capsule ?Commonly known as: NEURONTIN ?Take 300 mg by mouth 2 (two) times daily. ?  ?LORazepam 0.5 MG tablet ?Commonly known as: ATIVAN ?Take 1 tablet (0.5 mg total) by mouth every 6 (six) hours as needed  for anxiety. ?  ?melatonin 3 MG Tabs tablet ?Take 1 tablet (3 mg total) by mouth at bedtime as needed. ?  ?methotrexate 2.5 MG tablet ?Commonly known as: RHEUMATREX ?Take 7.5 mg by mouth every Friday. Caution:Chemotherapy. Protect from light.  ?Friday ?  ?metoprolol succinate 100 MG 24 hr tablet ?Commonly known as: TOPROL-XL ?Take 150 mg by mouth daily. ?  ?montelukast 10 MG tablet ?Commonly known as: SINGULAIR ?Take 10 mg by mouth daily. ?  ?omega-3 acid ethyl esters 1 g capsule ?Commonly known as: LOVAZA ?Take 1 g by mouth 2 (two) times daily. ?  ?OXYGEN ?Inhale 1 L into the lungs daily as needed (shortness of breath). ?  ?polyethylene glycol 17 g packet ?Commonly known as: MIRALAX / GLYCOLAX ?Take 17 g by mouth daily as needed for mild constipation. ?  ?senna 8.6 MG Tabs tablet ?Commonly  known as: SENOKOT ?Take 1 tablet (8.6 mg total) by mouth daily. ?Start taking on: September 09, 2021 ?  ?senna-docusate 8.6-50 MG tablet ?Commonly known as: Senokot-S ?Take 1 tablet by mouth 2 (two) times daily as nee

## 2021-09-08 NOTE — Progress Notes (Signed)
[name] to be D/C'd  per MD order.  Call report to [facility] place and gave report to [facility] LPN. ? ?VSS, Skin clean, dry and intact without evidence of skin break down, no evidence of skin tears noted. ? ?IV catheter discontinued intact. Site without signs and symptoms of complications. Dressing and pressure applied. ? ?An After Visit Summary was printed and given to the [place]. Prescription place with AVS to give to the facility. ? ?Patient instructed to return to ED, call 911, or call MD for any changes in condition.  ? ?Patient to be escorted via [facility]. Place condom catheter for transport. ?

## 2021-09-08 NOTE — Plan of Care (Signed)
  Problem: Clinical Measurements: Goal: Diagnostic test results will improve Outcome: Progressing   Problem: Activity: Goal: Risk for activity intolerance will decrease Outcome: Progressing   Problem: Coping: Goal: Level of anxiety will decrease Outcome: Progressing   

## 2021-09-08 NOTE — TOC Transition Note (Signed)
Transition of Care (TOC) - CM/SW Discharge Note ? ? ?Patient Details  ?Name: [name] ?MRN: [id number] ?Date of Birth: 10/03/1932 ? ?Transition of Care (TOC) CM/SW Contact:  ?Coralee Pesa, LCSWA ?Phone Number: ?09/08/2021, 10:00 AM ? ? ?Clinical Narrative:    ?Pt to be transported to [facility] via [place]. ?Nurse to call report to [phone]. ?Rm # [id number]. ? ? ?Final next level of care: [place] ?Barriers to Discharge: Barriers Resolved ? ? ?Patient Goals and CMS Choice ?Patient states their goals for this hospitalization and ongoing recovery are:: to get stronger ?CMS Medicare.gov Compare Post Acute Care list provided to:: Patient ?Choice offered to / list presented to : Patient ? ?Discharge Placement ?  ?           ?Patient [facility] bed at: [facility] ?Patient to be transferred to facility by: [facility] ?Name of family member notified: Patient, Requested friends be notified ?Patient and family notified of of transfer: 09/08/21 ? ?Discharge Plan and Services ?  ?  ?           ?  ?  ?  ?  ?  ?  ?  ?  ?  ?  ? ?Social Determinants of Health (SDOH) Interventions ?  ? ? ?Readmission Risk Interventions ?   ? View : No data to display.  ?  ?  ?  ? ? ? ? ? ?

## 2021-09-09 DIAGNOSIS — E876 Hypokalemia: Secondary | ICD-10-CM | POA: Diagnosis not present

## 2021-09-09 DIAGNOSIS — M6281 Muscle weakness (generalized): Secondary | ICD-10-CM | POA: Diagnosis not present

## 2021-09-09 DIAGNOSIS — E871 Hypo-osmolality and hyponatremia: Secondary | ICD-10-CM | POA: Diagnosis not present

## 2021-09-09 DIAGNOSIS — D649 Anemia, unspecified: Secondary | ICD-10-CM | POA: Diagnosis not present

## 2021-09-09 DIAGNOSIS — R296 Repeated falls: Secondary | ICD-10-CM | POA: Diagnosis not present

## 2021-09-09 DIAGNOSIS — L03115 Cellulitis of right lower limb: Secondary | ICD-10-CM | POA: Diagnosis not present

## 2021-09-09 DIAGNOSIS — S81801A Unspecified open wound, right lower leg, initial encounter: Secondary | ICD-10-CM | POA: Diagnosis not present

## 2021-09-09 DIAGNOSIS — I5032 Chronic diastolic (congestive) heart failure: Secondary | ICD-10-CM | POA: Diagnosis not present

## 2021-09-11 DIAGNOSIS — N390 Urinary tract infection, site not specified: Secondary | ICD-10-CM | POA: Diagnosis not present

## 2021-09-11 DIAGNOSIS — L03115 Cellulitis of right lower limb: Secondary | ICD-10-CM | POA: Diagnosis not present

## 2021-09-11 DIAGNOSIS — M6281 Muscle weakness (generalized): Secondary | ICD-10-CM | POA: Diagnosis not present

## 2021-09-11 DIAGNOSIS — R296 Repeated falls: Secondary | ICD-10-CM | POA: Diagnosis not present

## 2021-09-11 DIAGNOSIS — I5032 Chronic diastolic (congestive) heart failure: Secondary | ICD-10-CM | POA: Diagnosis not present

## 2021-09-11 DIAGNOSIS — E871 Hypo-osmolality and hyponatremia: Secondary | ICD-10-CM | POA: Diagnosis not present

## 2021-09-11 DIAGNOSIS — E876 Hypokalemia: Secondary | ICD-10-CM | POA: Diagnosis not present

## 2021-09-11 DIAGNOSIS — J45909 Unspecified asthma, uncomplicated: Secondary | ICD-10-CM | POA: Diagnosis not present

## 2021-09-11 DIAGNOSIS — D649 Anemia, unspecified: Secondary | ICD-10-CM | POA: Diagnosis not present

## 2021-09-15 DIAGNOSIS — D649 Anemia, unspecified: Secondary | ICD-10-CM | POA: Diagnosis not present

## 2021-09-15 DIAGNOSIS — M6281 Muscle weakness (generalized): Secondary | ICD-10-CM | POA: Diagnosis not present

## 2021-09-15 DIAGNOSIS — E871 Hypo-osmolality and hyponatremia: Secondary | ICD-10-CM | POA: Diagnosis not present

## 2021-09-15 DIAGNOSIS — E876 Hypokalemia: Secondary | ICD-10-CM | POA: Diagnosis not present

## 2021-09-15 DIAGNOSIS — I5032 Chronic diastolic (congestive) heart failure: Secondary | ICD-10-CM | POA: Diagnosis not present

## 2021-09-15 DIAGNOSIS — L03115 Cellulitis of right lower limb: Secondary | ICD-10-CM | POA: Diagnosis not present

## 2021-09-15 DIAGNOSIS — R296 Repeated falls: Secondary | ICD-10-CM | POA: Diagnosis not present

## 2021-09-16 DIAGNOSIS — S81801A Unspecified open wound, right lower leg, initial encounter: Secondary | ICD-10-CM | POA: Diagnosis not present

## 2021-09-18 DIAGNOSIS — R2689 Other abnormalities of gait and mobility: Secondary | ICD-10-CM | POA: Diagnosis not present

## 2021-09-18 DIAGNOSIS — L0390 Cellulitis, unspecified: Secondary | ICD-10-CM | POA: Diagnosis not present

## 2021-09-18 DIAGNOSIS — R2681 Unsteadiness on feet: Secondary | ICD-10-CM | POA: Diagnosis not present

## 2021-09-18 DIAGNOSIS — I5033 Acute on chronic diastolic (congestive) heart failure: Secondary | ICD-10-CM | POA: Diagnosis not present

## 2021-09-19 DIAGNOSIS — L0390 Cellulitis, unspecified: Secondary | ICD-10-CM | POA: Diagnosis not present

## 2021-09-19 DIAGNOSIS — R296 Repeated falls: Secondary | ICD-10-CM | POA: Diagnosis not present

## 2021-09-19 DIAGNOSIS — E871 Hypo-osmolality and hyponatremia: Secondary | ICD-10-CM | POA: Diagnosis not present

## 2021-09-19 DIAGNOSIS — D649 Anemia, unspecified: Secondary | ICD-10-CM | POA: Diagnosis not present

## 2021-09-19 DIAGNOSIS — R2689 Other abnormalities of gait and mobility: Secondary | ICD-10-CM | POA: Diagnosis not present

## 2021-09-19 DIAGNOSIS — M6281 Muscle weakness (generalized): Secondary | ICD-10-CM | POA: Diagnosis not present

## 2021-09-19 DIAGNOSIS — I5033 Acute on chronic diastolic (congestive) heart failure: Secondary | ICD-10-CM | POA: Diagnosis not present

## 2021-09-19 DIAGNOSIS — E876 Hypokalemia: Secondary | ICD-10-CM | POA: Diagnosis not present

## 2021-09-19 DIAGNOSIS — L03115 Cellulitis of right lower limb: Secondary | ICD-10-CM | POA: Diagnosis not present

## 2021-09-19 DIAGNOSIS — R2681 Unsteadiness on feet: Secondary | ICD-10-CM | POA: Diagnosis not present

## 2021-09-19 DIAGNOSIS — I5032 Chronic diastolic (congestive) heart failure: Secondary | ICD-10-CM | POA: Diagnosis not present

## 2021-09-23 DIAGNOSIS — M6281 Muscle weakness (generalized): Secondary | ICD-10-CM | POA: Diagnosis not present

## 2021-09-23 DIAGNOSIS — R296 Repeated falls: Secondary | ICD-10-CM | POA: Diagnosis not present

## 2021-09-23 DIAGNOSIS — L0390 Cellulitis, unspecified: Secondary | ICD-10-CM | POA: Diagnosis not present

## 2021-09-23 DIAGNOSIS — R2689 Other abnormalities of gait and mobility: Secondary | ICD-10-CM | POA: Diagnosis not present

## 2021-09-23 DIAGNOSIS — E871 Hypo-osmolality and hyponatremia: Secondary | ICD-10-CM | POA: Diagnosis not present

## 2021-09-23 DIAGNOSIS — I5032 Chronic diastolic (congestive) heart failure: Secondary | ICD-10-CM | POA: Diagnosis not present

## 2021-09-23 DIAGNOSIS — S81801A Unspecified open wound, right lower leg, initial encounter: Secondary | ICD-10-CM | POA: Diagnosis not present

## 2021-09-23 DIAGNOSIS — D649 Anemia, unspecified: Secondary | ICD-10-CM | POA: Diagnosis not present

## 2021-09-23 DIAGNOSIS — I5033 Acute on chronic diastolic (congestive) heart failure: Secondary | ICD-10-CM | POA: Diagnosis not present

## 2021-09-23 DIAGNOSIS — F064 Anxiety disorder due to known physiological condition: Secondary | ICD-10-CM | POA: Diagnosis not present

## 2021-09-23 DIAGNOSIS — E876 Hypokalemia: Secondary | ICD-10-CM | POA: Diagnosis not present

## 2021-09-23 DIAGNOSIS — R2681 Unsteadiness on feet: Secondary | ICD-10-CM | POA: Diagnosis not present

## 2021-09-24 DIAGNOSIS — L0390 Cellulitis, unspecified: Secondary | ICD-10-CM | POA: Diagnosis not present

## 2021-09-24 DIAGNOSIS — I5033 Acute on chronic diastolic (congestive) heart failure: Secondary | ICD-10-CM | POA: Diagnosis not present

## 2021-09-24 DIAGNOSIS — R2681 Unsteadiness on feet: Secondary | ICD-10-CM | POA: Diagnosis not present

## 2021-09-24 DIAGNOSIS — R2689 Other abnormalities of gait and mobility: Secondary | ICD-10-CM | POA: Diagnosis not present

## 2021-09-29 DIAGNOSIS — R2689 Other abnormalities of gait and mobility: Secondary | ICD-10-CM | POA: Diagnosis not present

## 2021-09-29 DIAGNOSIS — L0390 Cellulitis, unspecified: Secondary | ICD-10-CM | POA: Diagnosis not present

## 2021-09-29 DIAGNOSIS — M6259 Muscle wasting and atrophy, not elsewhere classified, multiple sites: Secondary | ICD-10-CM | POA: Diagnosis not present

## 2021-09-29 DIAGNOSIS — I5033 Acute on chronic diastolic (congestive) heart failure: Secondary | ICD-10-CM | POA: Diagnosis not present

## 2021-09-29 DIAGNOSIS — M6281 Muscle weakness (generalized): Secondary | ICD-10-CM | POA: Diagnosis not present

## 2021-09-29 DIAGNOSIS — R278 Other lack of coordination: Secondary | ICD-10-CM | POA: Diagnosis not present

## 2021-09-29 DIAGNOSIS — Z741 Need for assistance with personal care: Secondary | ICD-10-CM | POA: Diagnosis not present

## 2021-09-29 DIAGNOSIS — F4322 Adjustment disorder with anxiety: Secondary | ICD-10-CM | POA: Diagnosis not present

## 2021-09-30 DIAGNOSIS — L0390 Cellulitis, unspecified: Secondary | ICD-10-CM | POA: Diagnosis not present

## 2021-09-30 DIAGNOSIS — M6259 Muscle wasting and atrophy, not elsewhere classified, multiple sites: Secondary | ICD-10-CM | POA: Diagnosis not present

## 2021-09-30 DIAGNOSIS — M6281 Muscle weakness (generalized): Secondary | ICD-10-CM | POA: Diagnosis not present

## 2021-09-30 DIAGNOSIS — Z741 Need for assistance with personal care: Secondary | ICD-10-CM | POA: Diagnosis not present

## 2021-09-30 DIAGNOSIS — R2681 Unsteadiness on feet: Secondary | ICD-10-CM | POA: Diagnosis not present

## 2021-09-30 DIAGNOSIS — R2689 Other abnormalities of gait and mobility: Secondary | ICD-10-CM | POA: Diagnosis not present

## 2021-09-30 DIAGNOSIS — R278 Other lack of coordination: Secondary | ICD-10-CM | POA: Diagnosis not present

## 2021-09-30 DIAGNOSIS — S81801A Unspecified open wound, right lower leg, initial encounter: Secondary | ICD-10-CM | POA: Diagnosis not present

## 2021-09-30 DIAGNOSIS — I5033 Acute on chronic diastolic (congestive) heart failure: Secondary | ICD-10-CM | POA: Diagnosis not present

## 2021-10-01 DIAGNOSIS — I5033 Acute on chronic diastolic (congestive) heart failure: Secondary | ICD-10-CM | POA: Diagnosis not present

## 2021-10-01 DIAGNOSIS — R278 Other lack of coordination: Secondary | ICD-10-CM | POA: Diagnosis not present

## 2021-10-01 DIAGNOSIS — Z741 Need for assistance with personal care: Secondary | ICD-10-CM | POA: Diagnosis not present

## 2021-10-01 DIAGNOSIS — M6281 Muscle weakness (generalized): Secondary | ICD-10-CM | POA: Diagnosis not present

## 2021-10-01 DIAGNOSIS — N390 Urinary tract infection, site not specified: Secondary | ICD-10-CM | POA: Diagnosis not present

## 2021-10-01 DIAGNOSIS — M6259 Muscle wasting and atrophy, not elsewhere classified, multiple sites: Secondary | ICD-10-CM | POA: Diagnosis not present

## 2021-10-01 DIAGNOSIS — R2689 Other abnormalities of gait and mobility: Secondary | ICD-10-CM | POA: Diagnosis not present

## 2021-10-01 DIAGNOSIS — I10 Essential (primary) hypertension: Secondary | ICD-10-CM | POA: Diagnosis not present

## 2021-10-01 DIAGNOSIS — L0390 Cellulitis, unspecified: Secondary | ICD-10-CM | POA: Diagnosis not present

## 2021-10-01 DIAGNOSIS — R4182 Altered mental status, unspecified: Secondary | ICD-10-CM | POA: Diagnosis not present

## 2021-10-02 DIAGNOSIS — L0390 Cellulitis, unspecified: Secondary | ICD-10-CM | POA: Diagnosis not present

## 2021-10-02 DIAGNOSIS — R278 Other lack of coordination: Secondary | ICD-10-CM | POA: Diagnosis not present

## 2021-10-02 DIAGNOSIS — N390 Urinary tract infection, site not specified: Secondary | ICD-10-CM | POA: Diagnosis not present

## 2021-10-02 DIAGNOSIS — I5033 Acute on chronic diastolic (congestive) heart failure: Secondary | ICD-10-CM | POA: Diagnosis not present

## 2021-10-02 DIAGNOSIS — Z741 Need for assistance with personal care: Secondary | ICD-10-CM | POA: Diagnosis not present

## 2021-10-02 DIAGNOSIS — M6259 Muscle wasting and atrophy, not elsewhere classified, multiple sites: Secondary | ICD-10-CM | POA: Diagnosis not present

## 2021-10-02 DIAGNOSIS — M6281 Muscle weakness (generalized): Secondary | ICD-10-CM | POA: Diagnosis not present

## 2021-10-02 DIAGNOSIS — R2689 Other abnormalities of gait and mobility: Secondary | ICD-10-CM | POA: Diagnosis not present

## 2021-10-03 DIAGNOSIS — L0390 Cellulitis, unspecified: Secondary | ICD-10-CM | POA: Diagnosis not present

## 2021-10-03 DIAGNOSIS — N401 Enlarged prostate with lower urinary tract symptoms: Secondary | ICD-10-CM | POA: Diagnosis not present

## 2021-10-03 DIAGNOSIS — M6259 Muscle wasting and atrophy, not elsewhere classified, multiple sites: Secondary | ICD-10-CM | POA: Diagnosis not present

## 2021-10-03 DIAGNOSIS — R278 Other lack of coordination: Secondary | ICD-10-CM | POA: Diagnosis not present

## 2021-10-03 DIAGNOSIS — M6281 Muscle weakness (generalized): Secondary | ICD-10-CM | POA: Diagnosis not present

## 2021-10-03 DIAGNOSIS — I5033 Acute on chronic diastolic (congestive) heart failure: Secondary | ICD-10-CM | POA: Diagnosis not present

## 2021-10-03 DIAGNOSIS — S81802D Unspecified open wound, left lower leg, subsequent encounter: Secondary | ICD-10-CM | POA: Diagnosis not present

## 2021-10-03 DIAGNOSIS — R2689 Other abnormalities of gait and mobility: Secondary | ICD-10-CM | POA: Diagnosis not present

## 2021-10-03 DIAGNOSIS — N390 Urinary tract infection, site not specified: Secondary | ICD-10-CM | POA: Diagnosis not present

## 2021-10-03 DIAGNOSIS — Z741 Need for assistance with personal care: Secondary | ICD-10-CM | POA: Diagnosis not present

## 2021-10-04 DIAGNOSIS — L0390 Cellulitis, unspecified: Secondary | ICD-10-CM | POA: Diagnosis not present

## 2021-10-04 DIAGNOSIS — Z741 Need for assistance with personal care: Secondary | ICD-10-CM | POA: Diagnosis not present

## 2021-10-04 DIAGNOSIS — M6281 Muscle weakness (generalized): Secondary | ICD-10-CM | POA: Diagnosis not present

## 2021-10-04 DIAGNOSIS — R278 Other lack of coordination: Secondary | ICD-10-CM | POA: Diagnosis not present

## 2021-10-04 DIAGNOSIS — R2689 Other abnormalities of gait and mobility: Secondary | ICD-10-CM | POA: Diagnosis not present

## 2021-10-04 DIAGNOSIS — I5033 Acute on chronic diastolic (congestive) heart failure: Secondary | ICD-10-CM | POA: Diagnosis not present

## 2021-10-04 DIAGNOSIS — M6259 Muscle wasting and atrophy, not elsewhere classified, multiple sites: Secondary | ICD-10-CM | POA: Diagnosis not present

## 2021-10-06 DIAGNOSIS — D649 Anemia, unspecified: Secondary | ICD-10-CM | POA: Diagnosis not present

## 2021-10-06 DIAGNOSIS — F064 Anxiety disorder due to known physiological condition: Secondary | ICD-10-CM | POA: Diagnosis not present

## 2021-10-06 DIAGNOSIS — M6281 Muscle weakness (generalized): Secondary | ICD-10-CM | POA: Diagnosis not present

## 2021-10-06 DIAGNOSIS — N390 Urinary tract infection, site not specified: Secondary | ICD-10-CM | POA: Diagnosis not present

## 2021-10-06 DIAGNOSIS — I5032 Chronic diastolic (congestive) heart failure: Secondary | ICD-10-CM | POA: Diagnosis not present

## 2021-10-06 DIAGNOSIS — E876 Hypokalemia: Secondary | ICD-10-CM | POA: Diagnosis not present

## 2021-10-06 DIAGNOSIS — E871 Hypo-osmolality and hyponatremia: Secondary | ICD-10-CM | POA: Diagnosis not present

## 2021-10-15 DIAGNOSIS — M6281 Muscle weakness (generalized): Secondary | ICD-10-CM | POA: Diagnosis not present

## 2021-10-15 DIAGNOSIS — R2689 Other abnormalities of gait and mobility: Secondary | ICD-10-CM | POA: Diagnosis not present

## 2021-10-15 DIAGNOSIS — Z9181 History of falling: Secondary | ICD-10-CM | POA: Diagnosis not present

## 2021-10-15 DIAGNOSIS — M5459 Other low back pain: Secondary | ICD-10-CM | POA: Diagnosis not present

## 2021-10-15 DIAGNOSIS — R41841 Cognitive communication deficit: Secondary | ICD-10-CM | POA: Diagnosis not present

## 2021-10-16 DIAGNOSIS — I5032 Chronic diastolic (congestive) heart failure: Secondary | ICD-10-CM | POA: Diagnosis not present

## 2021-10-16 DIAGNOSIS — I10 Essential (primary) hypertension: Secondary | ICD-10-CM | POA: Diagnosis not present

## 2021-10-17 DIAGNOSIS — Z9181 History of falling: Secondary | ICD-10-CM | POA: Diagnosis not present

## 2021-10-17 DIAGNOSIS — M6281 Muscle weakness (generalized): Secondary | ICD-10-CM | POA: Diagnosis not present

## 2021-10-17 DIAGNOSIS — M6259 Muscle wasting and atrophy, not elsewhere classified, multiple sites: Secondary | ICD-10-CM | POA: Diagnosis not present

## 2021-10-17 DIAGNOSIS — R4182 Altered mental status, unspecified: Secondary | ICD-10-CM | POA: Diagnosis not present

## 2021-10-17 DIAGNOSIS — R2689 Other abnormalities of gait and mobility: Secondary | ICD-10-CM | POA: Diagnosis not present

## 2021-10-17 DIAGNOSIS — M5459 Other low back pain: Secondary | ICD-10-CM | POA: Diagnosis not present

## 2021-10-20 DIAGNOSIS — R4182 Altered mental status, unspecified: Secondary | ICD-10-CM | POA: Diagnosis not present

## 2021-10-20 DIAGNOSIS — M6259 Muscle wasting and atrophy, not elsewhere classified, multiple sites: Secondary | ICD-10-CM | POA: Diagnosis not present

## 2021-10-21 DIAGNOSIS — Z9181 History of falling: Secondary | ICD-10-CM | POA: Diagnosis not present

## 2021-10-21 DIAGNOSIS — M6281 Muscle weakness (generalized): Secondary | ICD-10-CM | POA: Diagnosis not present

## 2021-10-21 DIAGNOSIS — R2689 Other abnormalities of gait and mobility: Secondary | ICD-10-CM | POA: Diagnosis not present

## 2021-10-21 DIAGNOSIS — M5459 Other low back pain: Secondary | ICD-10-CM | POA: Diagnosis not present

## 2021-10-22 DIAGNOSIS — F4321 Adjustment disorder with depressed mood: Secondary | ICD-10-CM | POA: Diagnosis not present

## 2021-10-22 DIAGNOSIS — I5032 Chronic diastolic (congestive) heart failure: Secondary | ICD-10-CM | POA: Diagnosis not present

## 2021-10-22 DIAGNOSIS — I10 Essential (primary) hypertension: Secondary | ICD-10-CM | POA: Diagnosis not present

## 2021-10-22 DIAGNOSIS — M6259 Muscle wasting and atrophy, not elsewhere classified, multiple sites: Secondary | ICD-10-CM | POA: Diagnosis not present

## 2021-10-22 DIAGNOSIS — R4182 Altered mental status, unspecified: Secondary | ICD-10-CM | POA: Diagnosis not present

## 2021-10-23 DIAGNOSIS — M6259 Muscle wasting and atrophy, not elsewhere classified, multiple sites: Secondary | ICD-10-CM | POA: Diagnosis not present

## 2021-10-23 DIAGNOSIS — R4182 Altered mental status, unspecified: Secondary | ICD-10-CM | POA: Diagnosis not present

## 2021-10-24 DIAGNOSIS — M5459 Other low back pain: Secondary | ICD-10-CM | POA: Diagnosis not present

## 2021-10-24 DIAGNOSIS — R2689 Other abnormalities of gait and mobility: Secondary | ICD-10-CM | POA: Diagnosis not present

## 2021-10-24 DIAGNOSIS — M6281 Muscle weakness (generalized): Secondary | ICD-10-CM | POA: Diagnosis not present

## 2021-10-24 DIAGNOSIS — Z9181 History of falling: Secondary | ICD-10-CM | POA: Diagnosis not present

## 2021-10-27 DIAGNOSIS — M6259 Muscle wasting and atrophy, not elsewhere classified, multiple sites: Secondary | ICD-10-CM | POA: Diagnosis not present

## 2021-10-27 DIAGNOSIS — R4182 Altered mental status, unspecified: Secondary | ICD-10-CM | POA: Diagnosis not present

## 2021-10-28 DIAGNOSIS — Z9181 History of falling: Secondary | ICD-10-CM | POA: Diagnosis not present

## 2021-10-28 DIAGNOSIS — R2689 Other abnormalities of gait and mobility: Secondary | ICD-10-CM | POA: Diagnosis not present

## 2021-10-28 DIAGNOSIS — M6281 Muscle weakness (generalized): Secondary | ICD-10-CM | POA: Diagnosis not present

## 2021-10-28 DIAGNOSIS — M5459 Other low back pain: Secondary | ICD-10-CM | POA: Diagnosis not present

## 2021-10-29 DIAGNOSIS — M5459 Other low back pain: Secondary | ICD-10-CM | POA: Diagnosis not present

## 2021-10-29 DIAGNOSIS — R2689 Other abnormalities of gait and mobility: Secondary | ICD-10-CM | POA: Diagnosis not present

## 2021-10-29 DIAGNOSIS — Z9181 History of falling: Secondary | ICD-10-CM | POA: Diagnosis not present

## 2021-10-29 DIAGNOSIS — M6281 Muscle weakness (generalized): Secondary | ICD-10-CM | POA: Diagnosis not present

## 2021-10-30 DIAGNOSIS — Z9181 History of falling: Secondary | ICD-10-CM | POA: Diagnosis not present

## 2021-10-30 DIAGNOSIS — R4182 Altered mental status, unspecified: Secondary | ICD-10-CM | POA: Diagnosis not present

## 2021-10-30 DIAGNOSIS — R2689 Other abnormalities of gait and mobility: Secondary | ICD-10-CM | POA: Diagnosis not present

## 2021-10-30 DIAGNOSIS — M6259 Muscle wasting and atrophy, not elsewhere classified, multiple sites: Secondary | ICD-10-CM | POA: Diagnosis not present

## 2021-10-30 DIAGNOSIS — M5459 Other low back pain: Secondary | ICD-10-CM | POA: Diagnosis not present

## 2021-10-30 DIAGNOSIS — M6281 Muscle weakness (generalized): Secondary | ICD-10-CM | POA: Diagnosis not present

## 2021-10-31 DIAGNOSIS — M6259 Muscle wasting and atrophy, not elsewhere classified, multiple sites: Secondary | ICD-10-CM | POA: Diagnosis not present

## 2021-10-31 DIAGNOSIS — R4182 Altered mental status, unspecified: Secondary | ICD-10-CM | POA: Diagnosis not present

## 2021-11-03 DIAGNOSIS — M6281 Muscle weakness (generalized): Secondary | ICD-10-CM | POA: Diagnosis not present

## 2021-11-03 DIAGNOSIS — M5459 Other low back pain: Secondary | ICD-10-CM | POA: Diagnosis not present

## 2021-11-03 DIAGNOSIS — Z9181 History of falling: Secondary | ICD-10-CM | POA: Diagnosis not present

## 2021-11-03 DIAGNOSIS — R2689 Other abnormalities of gait and mobility: Secondary | ICD-10-CM | POA: Diagnosis not present

## 2021-11-03 DIAGNOSIS — R4182 Altered mental status, unspecified: Secondary | ICD-10-CM | POA: Diagnosis not present

## 2021-11-03 DIAGNOSIS — M6259 Muscle wasting and atrophy, not elsewhere classified, multiple sites: Secondary | ICD-10-CM | POA: Diagnosis not present

## 2021-11-04 DIAGNOSIS — R4182 Altered mental status, unspecified: Secondary | ICD-10-CM | POA: Diagnosis not present

## 2021-11-04 DIAGNOSIS — M6259 Muscle wasting and atrophy, not elsewhere classified, multiple sites: Secondary | ICD-10-CM | POA: Diagnosis not present

## 2021-11-05 DIAGNOSIS — F419 Anxiety disorder, unspecified: Secondary | ICD-10-CM | POA: Diagnosis not present

## 2021-11-05 DIAGNOSIS — M6259 Muscle wasting and atrophy, not elsewhere classified, multiple sites: Secondary | ICD-10-CM | POA: Diagnosis not present

## 2021-11-05 DIAGNOSIS — T148XXD Other injury of unspecified body region, subsequent encounter: Secondary | ICD-10-CM | POA: Diagnosis not present

## 2021-11-05 DIAGNOSIS — I5032 Chronic diastolic (congestive) heart failure: Secondary | ICD-10-CM | POA: Diagnosis not present

## 2021-11-05 DIAGNOSIS — R4182 Altered mental status, unspecified: Secondary | ICD-10-CM | POA: Diagnosis not present

## 2021-11-06 ENCOUNTER — Encounter: Payer: Self-pay | Admitting: Infectious Diseases

## 2021-11-06 DIAGNOSIS — R2689 Other abnormalities of gait and mobility: Secondary | ICD-10-CM | POA: Diagnosis not present

## 2021-11-06 DIAGNOSIS — M6281 Muscle weakness (generalized): Secondary | ICD-10-CM | POA: Diagnosis not present

## 2021-11-06 DIAGNOSIS — Z9181 History of falling: Secondary | ICD-10-CM | POA: Diagnosis not present

## 2021-11-06 DIAGNOSIS — M5459 Other low back pain: Secondary | ICD-10-CM | POA: Diagnosis not present

## 2021-11-07 DIAGNOSIS — M6281 Muscle weakness (generalized): Secondary | ICD-10-CM | POA: Diagnosis not present

## 2021-11-07 DIAGNOSIS — R2689 Other abnormalities of gait and mobility: Secondary | ICD-10-CM | POA: Diagnosis not present

## 2021-11-07 DIAGNOSIS — Z9181 History of falling: Secondary | ICD-10-CM | POA: Diagnosis not present

## 2021-11-07 DIAGNOSIS — M5459 Other low back pain: Secondary | ICD-10-CM | POA: Diagnosis not present

## 2021-11-10 DIAGNOSIS — M6281 Muscle weakness (generalized): Secondary | ICD-10-CM | POA: Diagnosis not present

## 2021-11-10 DIAGNOSIS — M5459 Other low back pain: Secondary | ICD-10-CM | POA: Diagnosis not present

## 2021-11-10 DIAGNOSIS — R2689 Other abnormalities of gait and mobility: Secondary | ICD-10-CM | POA: Diagnosis not present

## 2021-11-10 DIAGNOSIS — Z9181 History of falling: Secondary | ICD-10-CM | POA: Diagnosis not present

## 2021-11-11 DIAGNOSIS — Z9181 History of falling: Secondary | ICD-10-CM | POA: Diagnosis not present

## 2021-11-11 DIAGNOSIS — M6281 Muscle weakness (generalized): Secondary | ICD-10-CM | POA: Diagnosis not present

## 2021-11-11 DIAGNOSIS — M5459 Other low back pain: Secondary | ICD-10-CM | POA: Diagnosis not present

## 2021-11-11 DIAGNOSIS — R2689 Other abnormalities of gait and mobility: Secondary | ICD-10-CM | POA: Diagnosis not present

## 2021-11-12 DIAGNOSIS — I5033 Acute on chronic diastolic (congestive) heart failure: Secondary | ICD-10-CM | POA: Diagnosis not present

## 2021-11-12 DIAGNOSIS — M6259 Muscle wasting and atrophy, not elsewhere classified, multiple sites: Secondary | ICD-10-CM | POA: Diagnosis not present

## 2021-11-12 DIAGNOSIS — R4182 Altered mental status, unspecified: Secondary | ICD-10-CM | POA: Diagnosis not present

## 2021-11-12 DIAGNOSIS — M6281 Muscle weakness (generalized): Secondary | ICD-10-CM | POA: Diagnosis not present

## 2021-11-12 DIAGNOSIS — M5459 Other low back pain: Secondary | ICD-10-CM | POA: Diagnosis not present

## 2021-11-12 DIAGNOSIS — I10 Essential (primary) hypertension: Secondary | ICD-10-CM | POA: Diagnosis not present

## 2021-11-12 DIAGNOSIS — R2689 Other abnormalities of gait and mobility: Secondary | ICD-10-CM | POA: Diagnosis not present

## 2021-11-12 DIAGNOSIS — Z9181 History of falling: Secondary | ICD-10-CM | POA: Diagnosis not present

## 2021-11-13 DIAGNOSIS — Z9181 History of falling: Secondary | ICD-10-CM | POA: Diagnosis not present

## 2021-11-13 DIAGNOSIS — M5459 Other low back pain: Secondary | ICD-10-CM | POA: Diagnosis not present

## 2021-11-13 DIAGNOSIS — R4182 Altered mental status, unspecified: Secondary | ICD-10-CM | POA: Diagnosis not present

## 2021-11-13 DIAGNOSIS — R2689 Other abnormalities of gait and mobility: Secondary | ICD-10-CM | POA: Diagnosis not present

## 2021-11-13 DIAGNOSIS — M6281 Muscle weakness (generalized): Secondary | ICD-10-CM | POA: Diagnosis not present

## 2021-11-13 DIAGNOSIS — M6259 Muscle wasting and atrophy, not elsewhere classified, multiple sites: Secondary | ICD-10-CM | POA: Diagnosis not present

## 2021-11-14 DIAGNOSIS — M6281 Muscle weakness (generalized): Secondary | ICD-10-CM | POA: Diagnosis not present

## 2021-11-14 DIAGNOSIS — R2689 Other abnormalities of gait and mobility: Secondary | ICD-10-CM | POA: Diagnosis not present

## 2021-11-14 DIAGNOSIS — M6259 Muscle wasting and atrophy, not elsewhere classified, multiple sites: Secondary | ICD-10-CM | POA: Diagnosis not present

## 2021-11-14 DIAGNOSIS — M5459 Other low back pain: Secondary | ICD-10-CM | POA: Diagnosis not present

## 2021-11-14 DIAGNOSIS — R4182 Altered mental status, unspecified: Secondary | ICD-10-CM | POA: Diagnosis not present

## 2021-11-14 DIAGNOSIS — Z9181 History of falling: Secondary | ICD-10-CM | POA: Diagnosis not present

## 2021-11-17 DIAGNOSIS — M6281 Muscle weakness (generalized): Secondary | ICD-10-CM | POA: Diagnosis not present

## 2021-11-17 DIAGNOSIS — M6259 Muscle wasting and atrophy, not elsewhere classified, multiple sites: Secondary | ICD-10-CM | POA: Diagnosis not present

## 2021-11-17 DIAGNOSIS — R4182 Altered mental status, unspecified: Secondary | ICD-10-CM | POA: Diagnosis not present

## 2021-11-17 DIAGNOSIS — R2689 Other abnormalities of gait and mobility: Secondary | ICD-10-CM | POA: Diagnosis not present

## 2021-11-17 DIAGNOSIS — M5459 Other low back pain: Secondary | ICD-10-CM | POA: Diagnosis not present

## 2021-11-17 DIAGNOSIS — Z9181 History of falling: Secondary | ICD-10-CM | POA: Diagnosis not present

## 2021-11-18 DIAGNOSIS — M5459 Other low back pain: Secondary | ICD-10-CM | POA: Diagnosis not present

## 2021-11-18 DIAGNOSIS — Z9181 History of falling: Secondary | ICD-10-CM | POA: Diagnosis not present

## 2021-11-18 DIAGNOSIS — M6281 Muscle weakness (generalized): Secondary | ICD-10-CM | POA: Diagnosis not present

## 2021-11-18 DIAGNOSIS — R2689 Other abnormalities of gait and mobility: Secondary | ICD-10-CM | POA: Diagnosis not present

## 2021-11-19 DIAGNOSIS — R41841 Cognitive communication deficit: Secondary | ICD-10-CM | POA: Diagnosis not present

## 2021-11-19 DIAGNOSIS — Z9181 History of falling: Secondary | ICD-10-CM | POA: Diagnosis not present

## 2021-11-19 DIAGNOSIS — R2689 Other abnormalities of gait and mobility: Secondary | ICD-10-CM | POA: Diagnosis not present

## 2021-11-19 DIAGNOSIS — M5459 Other low back pain: Secondary | ICD-10-CM | POA: Diagnosis not present

## 2021-11-19 DIAGNOSIS — M6281 Muscle weakness (generalized): Secondary | ICD-10-CM | POA: Diagnosis not present

## 2021-11-20 DIAGNOSIS — M6259 Muscle wasting and atrophy, not elsewhere classified, multiple sites: Secondary | ICD-10-CM | POA: Diagnosis not present

## 2021-11-20 DIAGNOSIS — R2689 Other abnormalities of gait and mobility: Secondary | ICD-10-CM | POA: Diagnosis not present

## 2021-11-20 DIAGNOSIS — M5459 Other low back pain: Secondary | ICD-10-CM | POA: Diagnosis not present

## 2021-11-20 DIAGNOSIS — R4182 Altered mental status, unspecified: Secondary | ICD-10-CM | POA: Diagnosis not present

## 2021-11-20 DIAGNOSIS — M6281 Muscle weakness (generalized): Secondary | ICD-10-CM | POA: Diagnosis not present

## 2021-11-20 DIAGNOSIS — Z9181 History of falling: Secondary | ICD-10-CM | POA: Diagnosis not present

## 2021-11-21 DIAGNOSIS — M6281 Muscle weakness (generalized): Secondary | ICD-10-CM | POA: Diagnosis not present

## 2021-11-21 DIAGNOSIS — M5459 Other low back pain: Secondary | ICD-10-CM | POA: Diagnosis not present

## 2021-11-21 DIAGNOSIS — Z9181 History of falling: Secondary | ICD-10-CM | POA: Diagnosis not present

## 2021-11-21 DIAGNOSIS — R2689 Other abnormalities of gait and mobility: Secondary | ICD-10-CM | POA: Diagnosis not present

## 2021-11-24 DIAGNOSIS — M6259 Muscle wasting and atrophy, not elsewhere classified, multiple sites: Secondary | ICD-10-CM | POA: Diagnosis not present

## 2021-11-24 DIAGNOSIS — R4182 Altered mental status, unspecified: Secondary | ICD-10-CM | POA: Diagnosis not present

## 2021-11-24 DIAGNOSIS — Z9181 History of falling: Secondary | ICD-10-CM | POA: Diagnosis not present

## 2021-11-24 DIAGNOSIS — R2689 Other abnormalities of gait and mobility: Secondary | ICD-10-CM | POA: Diagnosis not present

## 2021-11-24 DIAGNOSIS — M5459 Other low back pain: Secondary | ICD-10-CM | POA: Diagnosis not present

## 2021-11-24 DIAGNOSIS — R41841 Cognitive communication deficit: Secondary | ICD-10-CM | POA: Diagnosis not present

## 2021-11-24 DIAGNOSIS — M6281 Muscle weakness (generalized): Secondary | ICD-10-CM | POA: Diagnosis not present

## 2021-11-25 DIAGNOSIS — Z9181 History of falling: Secondary | ICD-10-CM | POA: Diagnosis not present

## 2021-11-25 DIAGNOSIS — M6281 Muscle weakness (generalized): Secondary | ICD-10-CM | POA: Diagnosis not present

## 2021-11-25 DIAGNOSIS — M5459 Other low back pain: Secondary | ICD-10-CM | POA: Diagnosis not present

## 2021-11-25 DIAGNOSIS — R2689 Other abnormalities of gait and mobility: Secondary | ICD-10-CM | POA: Diagnosis not present

## 2021-11-25 DIAGNOSIS — R4182 Altered mental status, unspecified: Secondary | ICD-10-CM | POA: Diagnosis not present

## 2021-11-25 DIAGNOSIS — M6259 Muscle wasting and atrophy, not elsewhere classified, multiple sites: Secondary | ICD-10-CM | POA: Diagnosis not present

## 2021-11-26 DIAGNOSIS — M6281 Muscle weakness (generalized): Secondary | ICD-10-CM | POA: Diagnosis not present

## 2021-11-26 DIAGNOSIS — R4182 Altered mental status, unspecified: Secondary | ICD-10-CM | POA: Diagnosis not present

## 2021-11-26 DIAGNOSIS — M6259 Muscle wasting and atrophy, not elsewhere classified, multiple sites: Secondary | ICD-10-CM | POA: Diagnosis not present

## 2021-11-26 DIAGNOSIS — Z9181 History of falling: Secondary | ICD-10-CM | POA: Diagnosis not present

## 2021-11-26 DIAGNOSIS — M5459 Other low back pain: Secondary | ICD-10-CM | POA: Diagnosis not present

## 2021-11-26 DIAGNOSIS — R2689 Other abnormalities of gait and mobility: Secondary | ICD-10-CM | POA: Diagnosis not present

## 2021-11-27 DIAGNOSIS — M5459 Other low back pain: Secondary | ICD-10-CM | POA: Diagnosis not present

## 2021-11-27 DIAGNOSIS — R2689 Other abnormalities of gait and mobility: Secondary | ICD-10-CM | POA: Diagnosis not present

## 2021-11-27 DIAGNOSIS — M6281 Muscle weakness (generalized): Secondary | ICD-10-CM | POA: Diagnosis not present

## 2021-11-27 DIAGNOSIS — Z9181 History of falling: Secondary | ICD-10-CM | POA: Diagnosis not present

## 2021-11-28 DIAGNOSIS — M5459 Other low back pain: Secondary | ICD-10-CM | POA: Diagnosis not present

## 2021-11-28 DIAGNOSIS — R2689 Other abnormalities of gait and mobility: Secondary | ICD-10-CM | POA: Diagnosis not present

## 2021-11-28 DIAGNOSIS — M6281 Muscle weakness (generalized): Secondary | ICD-10-CM | POA: Diagnosis not present

## 2021-11-28 DIAGNOSIS — Z9181 History of falling: Secondary | ICD-10-CM | POA: Diagnosis not present

## 2021-12-01 DIAGNOSIS — M5459 Other low back pain: Secondary | ICD-10-CM | POA: Diagnosis not present

## 2021-12-01 DIAGNOSIS — M6259 Muscle wasting and atrophy, not elsewhere classified, multiple sites: Secondary | ICD-10-CM | POA: Diagnosis not present

## 2021-12-01 DIAGNOSIS — R4182 Altered mental status, unspecified: Secondary | ICD-10-CM | POA: Diagnosis not present

## 2021-12-01 DIAGNOSIS — M6281 Muscle weakness (generalized): Secondary | ICD-10-CM | POA: Diagnosis not present

## 2021-12-01 DIAGNOSIS — Z9181 History of falling: Secondary | ICD-10-CM | POA: Diagnosis not present

## 2021-12-01 DIAGNOSIS — R2689 Other abnormalities of gait and mobility: Secondary | ICD-10-CM | POA: Diagnosis not present

## 2021-12-02 DIAGNOSIS — R2689 Other abnormalities of gait and mobility: Secondary | ICD-10-CM | POA: Diagnosis not present

## 2021-12-02 DIAGNOSIS — Z9181 History of falling: Secondary | ICD-10-CM | POA: Diagnosis not present

## 2021-12-02 DIAGNOSIS — M6281 Muscle weakness (generalized): Secondary | ICD-10-CM | POA: Diagnosis not present

## 2021-12-02 DIAGNOSIS — M5459 Other low back pain: Secondary | ICD-10-CM | POA: Diagnosis not present

## 2021-12-03 DIAGNOSIS — R2689 Other abnormalities of gait and mobility: Secondary | ICD-10-CM | POA: Diagnosis not present

## 2021-12-03 DIAGNOSIS — M5459 Other low back pain: Secondary | ICD-10-CM | POA: Diagnosis not present

## 2021-12-03 DIAGNOSIS — M6281 Muscle weakness (generalized): Secondary | ICD-10-CM | POA: Diagnosis not present

## 2021-12-03 DIAGNOSIS — Z9181 History of falling: Secondary | ICD-10-CM | POA: Diagnosis not present

## 2021-12-03 DIAGNOSIS — R4182 Altered mental status, unspecified: Secondary | ICD-10-CM | POA: Diagnosis not present

## 2021-12-03 DIAGNOSIS — M6259 Muscle wasting and atrophy, not elsewhere classified, multiple sites: Secondary | ICD-10-CM | POA: Diagnosis not present

## 2021-12-04 DIAGNOSIS — R2689 Other abnormalities of gait and mobility: Secondary | ICD-10-CM | POA: Diagnosis not present

## 2021-12-04 DIAGNOSIS — M5459 Other low back pain: Secondary | ICD-10-CM | POA: Diagnosis not present

## 2021-12-04 DIAGNOSIS — R262 Difficulty in walking, not elsewhere classified: Secondary | ICD-10-CM | POA: Diagnosis not present

## 2021-12-04 DIAGNOSIS — M6281 Muscle weakness (generalized): Secondary | ICD-10-CM | POA: Diagnosis not present

## 2021-12-04 DIAGNOSIS — I10 Essential (primary) hypertension: Secondary | ICD-10-CM | POA: Diagnosis not present

## 2021-12-04 DIAGNOSIS — R600 Localized edema: Secondary | ICD-10-CM | POA: Diagnosis not present

## 2021-12-04 DIAGNOSIS — F419 Anxiety disorder, unspecified: Secondary | ICD-10-CM | POA: Diagnosis not present

## 2021-12-04 DIAGNOSIS — Z9981 Dependence on supplemental oxygen: Secondary | ICD-10-CM | POA: Diagnosis not present

## 2021-12-04 DIAGNOSIS — I5032 Chronic diastolic (congestive) heart failure: Secondary | ICD-10-CM | POA: Diagnosis not present

## 2021-12-04 DIAGNOSIS — Z9181 History of falling: Secondary | ICD-10-CM | POA: Diagnosis not present

## 2021-12-05 DIAGNOSIS — Z9181 History of falling: Secondary | ICD-10-CM | POA: Diagnosis not present

## 2021-12-05 DIAGNOSIS — M6259 Muscle wasting and atrophy, not elsewhere classified, multiple sites: Secondary | ICD-10-CM | POA: Diagnosis not present

## 2021-12-05 DIAGNOSIS — R4182 Altered mental status, unspecified: Secondary | ICD-10-CM | POA: Diagnosis not present

## 2021-12-05 DIAGNOSIS — M5459 Other low back pain: Secondary | ICD-10-CM | POA: Diagnosis not present

## 2021-12-05 DIAGNOSIS — M6281 Muscle weakness (generalized): Secondary | ICD-10-CM | POA: Diagnosis not present

## 2021-12-05 DIAGNOSIS — R2689 Other abnormalities of gait and mobility: Secondary | ICD-10-CM | POA: Diagnosis not present

## 2021-12-08 DIAGNOSIS — Z9181 History of falling: Secondary | ICD-10-CM | POA: Diagnosis not present

## 2021-12-08 DIAGNOSIS — M6281 Muscle weakness (generalized): Secondary | ICD-10-CM | POA: Diagnosis not present

## 2021-12-08 DIAGNOSIS — M6259 Muscle wasting and atrophy, not elsewhere classified, multiple sites: Secondary | ICD-10-CM | POA: Diagnosis not present

## 2021-12-08 DIAGNOSIS — R4182 Altered mental status, unspecified: Secondary | ICD-10-CM | POA: Diagnosis not present

## 2021-12-08 DIAGNOSIS — R2689 Other abnormalities of gait and mobility: Secondary | ICD-10-CM | POA: Diagnosis not present

## 2021-12-08 DIAGNOSIS — M5459 Other low back pain: Secondary | ICD-10-CM | POA: Diagnosis not present

## 2021-12-09 DIAGNOSIS — M5459 Other low back pain: Secondary | ICD-10-CM | POA: Diagnosis not present

## 2021-12-09 DIAGNOSIS — M6259 Muscle wasting and atrophy, not elsewhere classified, multiple sites: Secondary | ICD-10-CM | POA: Diagnosis not present

## 2021-12-09 DIAGNOSIS — M6281 Muscle weakness (generalized): Secondary | ICD-10-CM | POA: Diagnosis not present

## 2021-12-09 DIAGNOSIS — Z9181 History of falling: Secondary | ICD-10-CM | POA: Diagnosis not present

## 2021-12-09 DIAGNOSIS — R2689 Other abnormalities of gait and mobility: Secondary | ICD-10-CM | POA: Diagnosis not present

## 2021-12-09 DIAGNOSIS — R4182 Altered mental status, unspecified: Secondary | ICD-10-CM | POA: Diagnosis not present

## 2021-12-10 DIAGNOSIS — J3489 Other specified disorders of nose and nasal sinuses: Secondary | ICD-10-CM | POA: Diagnosis not present

## 2021-12-10 DIAGNOSIS — R2689 Other abnormalities of gait and mobility: Secondary | ICD-10-CM | POA: Diagnosis not present

## 2021-12-10 DIAGNOSIS — Z9181 History of falling: Secondary | ICD-10-CM | POA: Diagnosis not present

## 2021-12-10 DIAGNOSIS — R059 Cough, unspecified: Secondary | ICD-10-CM | POA: Diagnosis not present

## 2021-12-10 DIAGNOSIS — M6281 Muscle weakness (generalized): Secondary | ICD-10-CM | POA: Diagnosis not present

## 2021-12-10 DIAGNOSIS — J069 Acute upper respiratory infection, unspecified: Secondary | ICD-10-CM | POA: Diagnosis not present

## 2021-12-10 DIAGNOSIS — M5459 Other low back pain: Secondary | ICD-10-CM | POA: Diagnosis not present

## 2021-12-11 DIAGNOSIS — M5459 Other low back pain: Secondary | ICD-10-CM | POA: Diagnosis not present

## 2021-12-11 DIAGNOSIS — I5033 Acute on chronic diastolic (congestive) heart failure: Secondary | ICD-10-CM | POA: Diagnosis not present

## 2021-12-11 DIAGNOSIS — R2689 Other abnormalities of gait and mobility: Secondary | ICD-10-CM | POA: Diagnosis not present

## 2021-12-11 DIAGNOSIS — M6281 Muscle weakness (generalized): Secondary | ICD-10-CM | POA: Diagnosis not present

## 2021-12-11 DIAGNOSIS — Z9181 History of falling: Secondary | ICD-10-CM | POA: Diagnosis not present

## 2021-12-11 DIAGNOSIS — I10 Essential (primary) hypertension: Secondary | ICD-10-CM | POA: Diagnosis not present

## 2021-12-15 DIAGNOSIS — M5459 Other low back pain: Secondary | ICD-10-CM | POA: Diagnosis not present

## 2021-12-15 DIAGNOSIS — R2689 Other abnormalities of gait and mobility: Secondary | ICD-10-CM | POA: Diagnosis not present

## 2021-12-15 DIAGNOSIS — Z9181 History of falling: Secondary | ICD-10-CM | POA: Diagnosis not present

## 2021-12-15 DIAGNOSIS — M6281 Muscle weakness (generalized): Secondary | ICD-10-CM | POA: Diagnosis not present

## 2021-12-16 DIAGNOSIS — M6281 Muscle weakness (generalized): Secondary | ICD-10-CM | POA: Diagnosis not present

## 2021-12-16 DIAGNOSIS — M5459 Other low back pain: Secondary | ICD-10-CM | POA: Diagnosis not present

## 2021-12-16 DIAGNOSIS — R2689 Other abnormalities of gait and mobility: Secondary | ICD-10-CM | POA: Diagnosis not present

## 2021-12-16 DIAGNOSIS — Z9181 History of falling: Secondary | ICD-10-CM | POA: Diagnosis not present

## 2021-12-17 DIAGNOSIS — R4182 Altered mental status, unspecified: Secondary | ICD-10-CM | POA: Diagnosis not present

## 2021-12-17 DIAGNOSIS — Z9181 History of falling: Secondary | ICD-10-CM | POA: Diagnosis not present

## 2021-12-17 DIAGNOSIS — M5459 Other low back pain: Secondary | ICD-10-CM | POA: Diagnosis not present

## 2021-12-17 DIAGNOSIS — M6259 Muscle wasting and atrophy, not elsewhere classified, multiple sites: Secondary | ICD-10-CM | POA: Diagnosis not present

## 2021-12-17 DIAGNOSIS — M6281 Muscle weakness (generalized): Secondary | ICD-10-CM | POA: Diagnosis not present

## 2021-12-17 DIAGNOSIS — R2689 Other abnormalities of gait and mobility: Secondary | ICD-10-CM | POA: Diagnosis not present

## 2021-12-19 DIAGNOSIS — M6259 Muscle wasting and atrophy, not elsewhere classified, multiple sites: Secondary | ICD-10-CM | POA: Diagnosis not present

## 2021-12-19 DIAGNOSIS — R4182 Altered mental status, unspecified: Secondary | ICD-10-CM | POA: Diagnosis not present

## 2021-12-22 DIAGNOSIS — M6281 Muscle weakness (generalized): Secondary | ICD-10-CM | POA: Diagnosis not present

## 2021-12-22 DIAGNOSIS — Z9181 History of falling: Secondary | ICD-10-CM | POA: Diagnosis not present

## 2021-12-22 DIAGNOSIS — M5459 Other low back pain: Secondary | ICD-10-CM | POA: Diagnosis not present

## 2021-12-22 DIAGNOSIS — R2689 Other abnormalities of gait and mobility: Secondary | ICD-10-CM | POA: Diagnosis not present

## 2021-12-23 DIAGNOSIS — R4182 Altered mental status, unspecified: Secondary | ICD-10-CM | POA: Diagnosis not present

## 2021-12-23 DIAGNOSIS — M6259 Muscle wasting and atrophy, not elsewhere classified, multiple sites: Secondary | ICD-10-CM | POA: Diagnosis not present

## 2021-12-24 DIAGNOSIS — Z9181 History of falling: Secondary | ICD-10-CM | POA: Diagnosis not present

## 2021-12-24 DIAGNOSIS — M5459 Other low back pain: Secondary | ICD-10-CM | POA: Diagnosis not present

## 2021-12-24 DIAGNOSIS — M6259 Muscle wasting and atrophy, not elsewhere classified, multiple sites: Secondary | ICD-10-CM | POA: Diagnosis not present

## 2021-12-24 DIAGNOSIS — M6281 Muscle weakness (generalized): Secondary | ICD-10-CM | POA: Diagnosis not present

## 2021-12-24 DIAGNOSIS — R4182 Altered mental status, unspecified: Secondary | ICD-10-CM | POA: Diagnosis not present

## 2021-12-24 DIAGNOSIS — R2689 Other abnormalities of gait and mobility: Secondary | ICD-10-CM | POA: Diagnosis not present

## 2021-12-25 DIAGNOSIS — Z9181 History of falling: Secondary | ICD-10-CM | POA: Diagnosis not present

## 2021-12-25 DIAGNOSIS — M6281 Muscle weakness (generalized): Secondary | ICD-10-CM | POA: Diagnosis not present

## 2021-12-25 DIAGNOSIS — R4182 Altered mental status, unspecified: Secondary | ICD-10-CM | POA: Diagnosis not present

## 2021-12-25 DIAGNOSIS — M5459 Other low back pain: Secondary | ICD-10-CM | POA: Diagnosis not present

## 2021-12-25 DIAGNOSIS — R2689 Other abnormalities of gait and mobility: Secondary | ICD-10-CM | POA: Diagnosis not present

## 2021-12-25 DIAGNOSIS — M6259 Muscle wasting and atrophy, not elsewhere classified, multiple sites: Secondary | ICD-10-CM | POA: Diagnosis not present

## 2021-12-26 DIAGNOSIS — Z9181 History of falling: Secondary | ICD-10-CM | POA: Diagnosis not present

## 2021-12-26 DIAGNOSIS — R4182 Altered mental status, unspecified: Secondary | ICD-10-CM | POA: Diagnosis not present

## 2021-12-26 DIAGNOSIS — R2689 Other abnormalities of gait and mobility: Secondary | ICD-10-CM | POA: Diagnosis not present

## 2021-12-26 DIAGNOSIS — M6259 Muscle wasting and atrophy, not elsewhere classified, multiple sites: Secondary | ICD-10-CM | POA: Diagnosis not present

## 2021-12-26 DIAGNOSIS — M5459 Other low back pain: Secondary | ICD-10-CM | POA: Diagnosis not present

## 2021-12-26 DIAGNOSIS — M6281 Muscle weakness (generalized): Secondary | ICD-10-CM | POA: Diagnosis not present

## 2021-12-29 DIAGNOSIS — R2689 Other abnormalities of gait and mobility: Secondary | ICD-10-CM | POA: Diagnosis not present

## 2021-12-29 DIAGNOSIS — M6281 Muscle weakness (generalized): Secondary | ICD-10-CM | POA: Diagnosis not present

## 2021-12-29 DIAGNOSIS — Z9181 History of falling: Secondary | ICD-10-CM | POA: Diagnosis not present

## 2021-12-29 DIAGNOSIS — M5459 Other low back pain: Secondary | ICD-10-CM | POA: Diagnosis not present

## 2021-12-30 DIAGNOSIS — M5459 Other low back pain: Secondary | ICD-10-CM | POA: Diagnosis not present

## 2021-12-30 DIAGNOSIS — Z9181 History of falling: Secondary | ICD-10-CM | POA: Diagnosis not present

## 2021-12-30 DIAGNOSIS — R2689 Other abnormalities of gait and mobility: Secondary | ICD-10-CM | POA: Diagnosis not present

## 2021-12-30 DIAGNOSIS — M6259 Muscle wasting and atrophy, not elsewhere classified, multiple sites: Secondary | ICD-10-CM | POA: Diagnosis not present

## 2021-12-30 DIAGNOSIS — R4182 Altered mental status, unspecified: Secondary | ICD-10-CM | POA: Diagnosis not present

## 2021-12-30 DIAGNOSIS — M6281 Muscle weakness (generalized): Secondary | ICD-10-CM | POA: Diagnosis not present

## 2021-12-31 DIAGNOSIS — L988 Other specified disorders of the skin and subcutaneous tissue: Secondary | ICD-10-CM | POA: Diagnosis not present

## 2021-12-31 DIAGNOSIS — R0689 Other abnormalities of breathing: Secondary | ICD-10-CM | POA: Diagnosis not present

## 2021-12-31 DIAGNOSIS — L0390 Cellulitis, unspecified: Secondary | ICD-10-CM | POA: Diagnosis not present

## 2021-12-31 DIAGNOSIS — Z9181 History of falling: Secondary | ICD-10-CM | POA: Diagnosis not present

## 2021-12-31 DIAGNOSIS — L88 Pyoderma gangrenosum: Secondary | ICD-10-CM | POA: Diagnosis not present

## 2021-12-31 DIAGNOSIS — S0990XA Unspecified injury of head, initial encounter: Secondary | ICD-10-CM | POA: Diagnosis not present

## 2021-12-31 DIAGNOSIS — R4182 Altered mental status, unspecified: Secondary | ICD-10-CM | POA: Diagnosis not present

## 2021-12-31 DIAGNOSIS — M6281 Muscle weakness (generalized): Secondary | ICD-10-CM | POA: Diagnosis not present

## 2021-12-31 DIAGNOSIS — J189 Pneumonia, unspecified organism: Secondary | ICD-10-CM | POA: Diagnosis not present

## 2021-12-31 DIAGNOSIS — I110 Hypertensive heart disease with heart failure: Secondary | ICD-10-CM | POA: Diagnosis not present

## 2021-12-31 DIAGNOSIS — Z20822 Contact with and (suspected) exposure to covid-19: Secondary | ICD-10-CM | POA: Diagnosis not present

## 2021-12-31 DIAGNOSIS — J9611 Chronic respiratory failure with hypoxia: Secondary | ICD-10-CM | POA: Diagnosis not present

## 2021-12-31 DIAGNOSIS — R41841 Cognitive communication deficit: Secondary | ICD-10-CM | POA: Diagnosis not present

## 2021-12-31 DIAGNOSIS — L120 Bullous pemphigoid: Secondary | ICD-10-CM | POA: Diagnosis not present

## 2021-12-31 DIAGNOSIS — I5032 Chronic diastolic (congestive) heart failure: Secondary | ICD-10-CM | POA: Diagnosis not present

## 2021-12-31 DIAGNOSIS — F32A Depression, unspecified: Secondary | ICD-10-CM | POA: Diagnosis not present

## 2021-12-31 DIAGNOSIS — S81802A Unspecified open wound, left lower leg, initial encounter: Secondary | ICD-10-CM | POA: Diagnosis not present

## 2021-12-31 DIAGNOSIS — R509 Fever, unspecified: Secondary | ICD-10-CM | POA: Diagnosis not present

## 2021-12-31 DIAGNOSIS — M25531 Pain in right wrist: Secondary | ICD-10-CM | POA: Diagnosis not present

## 2021-12-31 DIAGNOSIS — M7989 Other specified soft tissue disorders: Secondary | ICD-10-CM | POA: Diagnosis not present

## 2021-12-31 DIAGNOSIS — R2681 Unsteadiness on feet: Secondary | ICD-10-CM | POA: Diagnosis not present

## 2021-12-31 DIAGNOSIS — E876 Hypokalemia: Secondary | ICD-10-CM | POA: Diagnosis not present

## 2021-12-31 DIAGNOSIS — R001 Bradycardia, unspecified: Secondary | ICD-10-CM | POA: Diagnosis not present

## 2021-12-31 DIAGNOSIS — I10 Essential (primary) hypertension: Secondary | ICD-10-CM | POA: Diagnosis not present

## 2021-12-31 DIAGNOSIS — R262 Difficulty in walking, not elsewhere classified: Secondary | ICD-10-CM | POA: Diagnosis not present

## 2021-12-31 DIAGNOSIS — R2231 Localized swelling, mass and lump, right upper limb: Secondary | ICD-10-CM | POA: Diagnosis not present

## 2021-12-31 DIAGNOSIS — L03115 Cellulitis of right lower limb: Secondary | ICD-10-CM | POA: Diagnosis not present

## 2021-12-31 DIAGNOSIS — M6259 Muscle wasting and atrophy, not elsewhere classified, multiple sites: Secondary | ICD-10-CM | POA: Diagnosis not present

## 2021-12-31 DIAGNOSIS — I959 Hypotension, unspecified: Secondary | ICD-10-CM | POA: Diagnosis not present

## 2021-12-31 DIAGNOSIS — R0902 Hypoxemia: Secondary | ICD-10-CM | POA: Diagnosis not present

## 2021-12-31 DIAGNOSIS — L97919 Non-pressure chronic ulcer of unspecified part of right lower leg with unspecified severity: Secondary | ICD-10-CM | POA: Diagnosis not present

## 2021-12-31 DIAGNOSIS — J45909 Unspecified asthma, uncomplicated: Secondary | ICD-10-CM | POA: Diagnosis not present

## 2021-12-31 DIAGNOSIS — L03116 Cellulitis of left lower limb: Secondary | ICD-10-CM | POA: Diagnosis not present

## 2022-01-06 DIAGNOSIS — L120 Bullous pemphigoid: Secondary | ICD-10-CM | POA: Diagnosis not present

## 2022-01-15 DIAGNOSIS — R41841 Cognitive communication deficit: Secondary | ICD-10-CM | POA: Diagnosis not present

## 2022-01-15 DIAGNOSIS — F330 Major depressive disorder, recurrent, mild: Secondary | ICD-10-CM | POA: Diagnosis not present

## 2022-01-15 DIAGNOSIS — L03116 Cellulitis of left lower limb: Secondary | ICD-10-CM | POA: Diagnosis not present

## 2022-01-15 DIAGNOSIS — J9611 Chronic respiratory failure with hypoxia: Secondary | ICD-10-CM | POA: Diagnosis not present

## 2022-01-15 DIAGNOSIS — Z9181 History of falling: Secondary | ICD-10-CM | POA: Diagnosis not present

## 2022-01-15 DIAGNOSIS — R2231 Localized swelling, mass and lump, right upper limb: Secondary | ICD-10-CM | POA: Diagnosis not present

## 2022-01-15 DIAGNOSIS — J449 Chronic obstructive pulmonary disease, unspecified: Secondary | ICD-10-CM | POA: Diagnosis not present

## 2022-01-15 DIAGNOSIS — J9690 Respiratory failure, unspecified, unspecified whether with hypoxia or hypercapnia: Secondary | ICD-10-CM | POA: Diagnosis not present

## 2022-01-15 DIAGNOSIS — F32A Depression, unspecified: Secondary | ICD-10-CM | POA: Diagnosis not present

## 2022-01-15 DIAGNOSIS — F4323 Adjustment disorder with mixed anxiety and depressed mood: Secondary | ICD-10-CM | POA: Diagnosis not present

## 2022-01-15 DIAGNOSIS — N400 Enlarged prostate without lower urinary tract symptoms: Secondary | ICD-10-CM | POA: Diagnosis not present

## 2022-01-15 DIAGNOSIS — L120 Bullous pemphigoid: Secondary | ICD-10-CM | POA: Diagnosis not present

## 2022-01-15 DIAGNOSIS — L03115 Cellulitis of right lower limb: Secondary | ICD-10-CM | POA: Diagnosis not present

## 2022-01-15 DIAGNOSIS — M6281 Muscle weakness (generalized): Secondary | ICD-10-CM | POA: Diagnosis not present

## 2022-01-15 DIAGNOSIS — J189 Pneumonia, unspecified organism: Secondary | ICD-10-CM | POA: Diagnosis not present

## 2022-01-15 DIAGNOSIS — I509 Heart failure, unspecified: Secondary | ICD-10-CM | POA: Diagnosis not present

## 2022-01-15 DIAGNOSIS — R262 Difficulty in walking, not elsewhere classified: Secondary | ICD-10-CM | POA: Diagnosis not present

## 2022-01-15 DIAGNOSIS — R2681 Unsteadiness on feet: Secondary | ICD-10-CM | POA: Diagnosis not present

## 2022-01-15 DIAGNOSIS — G629 Polyneuropathy, unspecified: Secondary | ICD-10-CM | POA: Diagnosis not present

## 2022-01-15 DIAGNOSIS — I10 Essential (primary) hypertension: Secondary | ICD-10-CM | POA: Diagnosis not present

## 2022-01-15 DIAGNOSIS — F064 Anxiety disorder due to known physiological condition: Secondary | ICD-10-CM | POA: Diagnosis not present

## 2022-01-23 DIAGNOSIS — J449 Chronic obstructive pulmonary disease, unspecified: Secondary | ICD-10-CM | POA: Diagnosis not present

## 2022-01-23 DIAGNOSIS — R262 Difficulty in walking, not elsewhere classified: Secondary | ICD-10-CM | POA: Diagnosis not present

## 2022-01-23 DIAGNOSIS — M6281 Muscle weakness (generalized): Secondary | ICD-10-CM | POA: Diagnosis not present

## 2022-01-25 DIAGNOSIS — N400 Enlarged prostate without lower urinary tract symptoms: Secondary | ICD-10-CM | POA: Diagnosis not present

## 2022-01-25 DIAGNOSIS — L03115 Cellulitis of right lower limb: Secondary | ICD-10-CM | POA: Diagnosis not present

## 2022-01-25 DIAGNOSIS — G629 Polyneuropathy, unspecified: Secondary | ICD-10-CM | POA: Diagnosis not present

## 2022-01-25 DIAGNOSIS — L03116 Cellulitis of left lower limb: Secondary | ICD-10-CM | POA: Diagnosis not present

## 2022-01-25 DIAGNOSIS — L120 Bullous pemphigoid: Secondary | ICD-10-CM | POA: Diagnosis not present

## 2022-01-25 DIAGNOSIS — J9690 Respiratory failure, unspecified, unspecified whether with hypoxia or hypercapnia: Secondary | ICD-10-CM | POA: Diagnosis not present

## 2022-01-25 DIAGNOSIS — F32A Depression, unspecified: Secondary | ICD-10-CM | POA: Diagnosis not present

## 2022-01-25 DIAGNOSIS — I509 Heart failure, unspecified: Secondary | ICD-10-CM | POA: Diagnosis not present

## 2022-01-27 DIAGNOSIS — M6281 Muscle weakness (generalized): Secondary | ICD-10-CM | POA: Diagnosis not present

## 2022-01-27 DIAGNOSIS — R262 Difficulty in walking, not elsewhere classified: Secondary | ICD-10-CM | POA: Diagnosis not present

## 2022-01-27 DIAGNOSIS — J449 Chronic obstructive pulmonary disease, unspecified: Secondary | ICD-10-CM | POA: Diagnosis not present

## 2022-01-29 DIAGNOSIS — M6281 Muscle weakness (generalized): Secondary | ICD-10-CM | POA: Diagnosis not present

## 2022-01-29 DIAGNOSIS — J449 Chronic obstructive pulmonary disease, unspecified: Secondary | ICD-10-CM | POA: Diagnosis not present

## 2022-01-29 DIAGNOSIS — R262 Difficulty in walking, not elsewhere classified: Secondary | ICD-10-CM | POA: Diagnosis not present

## 2022-02-02 DIAGNOSIS — R262 Difficulty in walking, not elsewhere classified: Secondary | ICD-10-CM | POA: Diagnosis not present

## 2022-02-02 DIAGNOSIS — J449 Chronic obstructive pulmonary disease, unspecified: Secondary | ICD-10-CM | POA: Diagnosis not present

## 2022-02-02 DIAGNOSIS — M6281 Muscle weakness (generalized): Secondary | ICD-10-CM | POA: Diagnosis not present

## 2022-02-04 DIAGNOSIS — F064 Anxiety disorder due to known physiological condition: Secondary | ICD-10-CM | POA: Diagnosis not present

## 2022-02-04 DIAGNOSIS — F330 Major depressive disorder, recurrent, mild: Secondary | ICD-10-CM | POA: Diagnosis not present

## 2022-02-04 DIAGNOSIS — F4323 Adjustment disorder with mixed anxiety and depressed mood: Secondary | ICD-10-CM | POA: Diagnosis not present

## 2022-02-05 DIAGNOSIS — R262 Difficulty in walking, not elsewhere classified: Secondary | ICD-10-CM | POA: Diagnosis not present

## 2022-02-05 DIAGNOSIS — M6281 Muscle weakness (generalized): Secondary | ICD-10-CM | POA: Diagnosis not present

## 2022-02-05 DIAGNOSIS — J449 Chronic obstructive pulmonary disease, unspecified: Secondary | ICD-10-CM | POA: Diagnosis not present

## 2022-02-10 DIAGNOSIS — M6281 Muscle weakness (generalized): Secondary | ICD-10-CM | POA: Diagnosis not present

## 2022-02-10 DIAGNOSIS — R262 Difficulty in walking, not elsewhere classified: Secondary | ICD-10-CM | POA: Diagnosis not present

## 2022-02-10 DIAGNOSIS — J449 Chronic obstructive pulmonary disease, unspecified: Secondary | ICD-10-CM | POA: Diagnosis not present

## 2022-02-11 DIAGNOSIS — F330 Major depressive disorder, recurrent, mild: Secondary | ICD-10-CM | POA: Diagnosis not present

## 2022-02-11 DIAGNOSIS — F4323 Adjustment disorder with mixed anxiety and depressed mood: Secondary | ICD-10-CM | POA: Diagnosis not present

## 2022-02-11 DIAGNOSIS — F064 Anxiety disorder due to known physiological condition: Secondary | ICD-10-CM | POA: Diagnosis not present

## 2022-02-16 DIAGNOSIS — J9610 Chronic respiratory failure, unspecified whether with hypoxia or hypercapnia: Secondary | ICD-10-CM | POA: Diagnosis not present

## 2022-02-16 DIAGNOSIS — I110 Hypertensive heart disease with heart failure: Secondary | ICD-10-CM | POA: Diagnosis not present

## 2022-02-16 DIAGNOSIS — R29898 Other symptoms and signs involving the musculoskeletal system: Secondary | ICD-10-CM | POA: Diagnosis not present

## 2022-02-16 DIAGNOSIS — Z79899 Other long term (current) drug therapy: Secondary | ICD-10-CM | POA: Diagnosis not present

## 2022-02-16 DIAGNOSIS — R0689 Other abnormalities of breathing: Secondary | ICD-10-CM | POA: Diagnosis not present

## 2022-02-16 DIAGNOSIS — I5032 Chronic diastolic (congestive) heart failure: Secondary | ICD-10-CM | POA: Diagnosis not present

## 2022-02-16 DIAGNOSIS — N400 Enlarged prostate without lower urinary tract symptoms: Secondary | ICD-10-CM | POA: Diagnosis not present

## 2022-02-16 DIAGNOSIS — K5730 Diverticulosis of large intestine without perforation or abscess without bleeding: Secondary | ICD-10-CM | POA: Diagnosis not present

## 2022-02-16 DIAGNOSIS — R110 Nausea: Secondary | ICD-10-CM | POA: Diagnosis not present

## 2022-02-16 DIAGNOSIS — Z7982 Long term (current) use of aspirin: Secondary | ICD-10-CM | POA: Diagnosis not present

## 2022-02-16 DIAGNOSIS — Z885 Allergy status to narcotic agent status: Secondary | ICD-10-CM | POA: Diagnosis not present

## 2022-02-16 DIAGNOSIS — N281 Cyst of kidney, acquired: Secondary | ICD-10-CM | POA: Diagnosis not present

## 2022-02-16 DIAGNOSIS — N390 Urinary tract infection, site not specified: Secondary | ICD-10-CM | POA: Diagnosis not present

## 2022-02-17 DIAGNOSIS — Z7409 Other reduced mobility: Secondary | ICD-10-CM | POA: Diagnosis not present

## 2022-02-17 DIAGNOSIS — R6889 Other general symptoms and signs: Secondary | ICD-10-CM | POA: Diagnosis not present

## 2022-02-19 DIAGNOSIS — M069 Rheumatoid arthritis, unspecified: Secondary | ICD-10-CM | POA: Diagnosis not present

## 2022-02-19 DIAGNOSIS — K5900 Constipation, unspecified: Secondary | ICD-10-CM | POA: Diagnosis not present

## 2022-02-19 DIAGNOSIS — E782 Mixed hyperlipidemia: Secondary | ICD-10-CM | POA: Diagnosis not present

## 2022-02-19 DIAGNOSIS — R269 Unspecified abnormalities of gait and mobility: Secondary | ICD-10-CM | POA: Diagnosis not present

## 2022-02-19 DIAGNOSIS — I110 Hypertensive heart disease with heart failure: Secondary | ICD-10-CM | POA: Diagnosis not present

## 2022-02-19 DIAGNOSIS — J302 Other seasonal allergic rhinitis: Secondary | ICD-10-CM | POA: Diagnosis not present

## 2022-02-19 DIAGNOSIS — D528 Other folate deficiency anemias: Secondary | ICD-10-CM | POA: Diagnosis not present

## 2022-02-19 DIAGNOSIS — F330 Major depressive disorder, recurrent, mild: Secondary | ICD-10-CM | POA: Diagnosis not present

## 2022-02-19 DIAGNOSIS — L03115 Cellulitis of right lower limb: Secondary | ICD-10-CM | POA: Diagnosis not present

## 2022-02-23 IMAGING — CT CT ANGIO CHEST
2 of 6 series · 18 of 36 positions shown · IV contrast (agent unspecified)
Comparison: 04/07/2011

CLINICAL DATA: Positive D-dimer, hypoxia

EXAM:
CT ANGIOGRAPHY CHEST WITH CONTRAST
TECHNIQUE: Multidetector CT imaging of the chest was performed using the
standard protocol during bolus administration of intravenous
contrast. Multiplanar CT image reconstructions and MIPs were
obtained to evaluate the vascular anatomy.

[Series 6: thins · axial · 0.98mm/px · z∈[-317,-48]mm · 17 of 303 slices shown]
[im 17/303  lung]
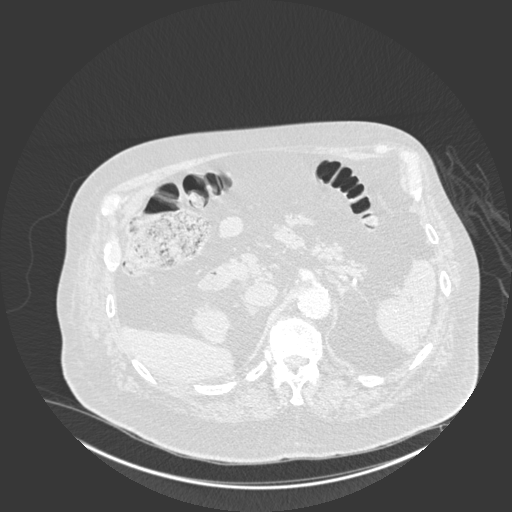
[im 34/303  mediastinal]
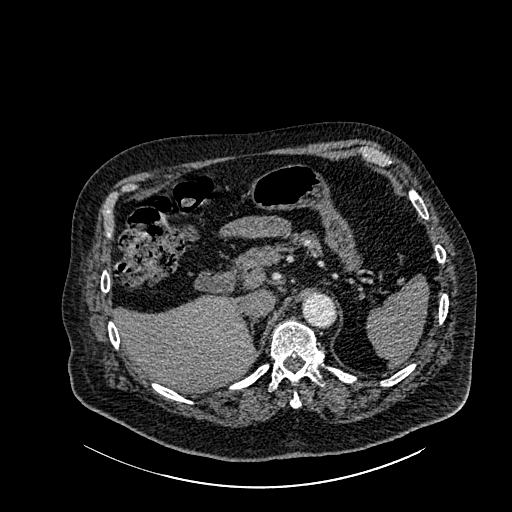
[im 51/303  lung]
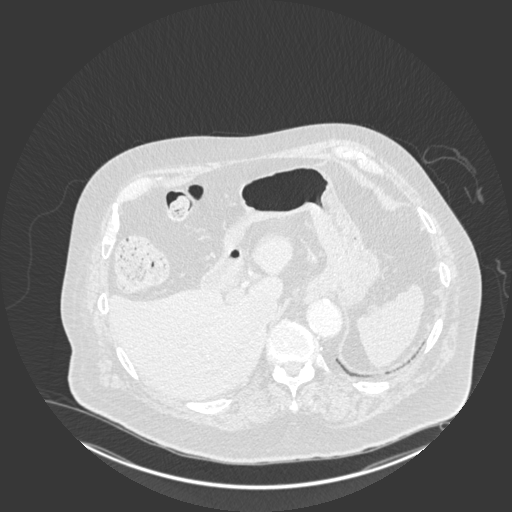
[im 68/303  mediastinal]
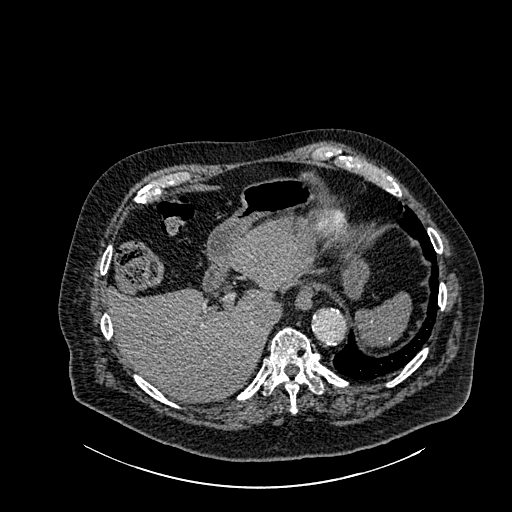
[im 84/303  lung]
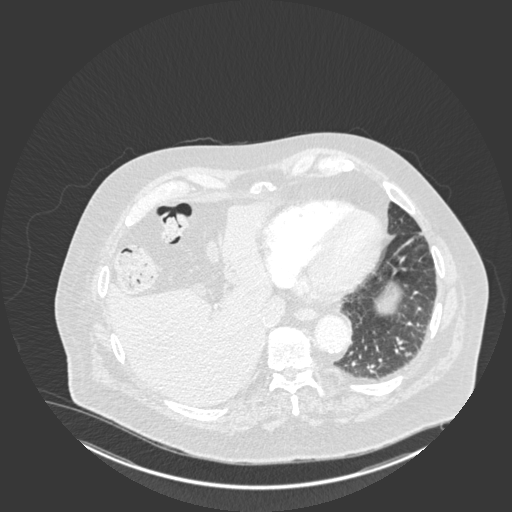
[im 101/303  mediastinal]
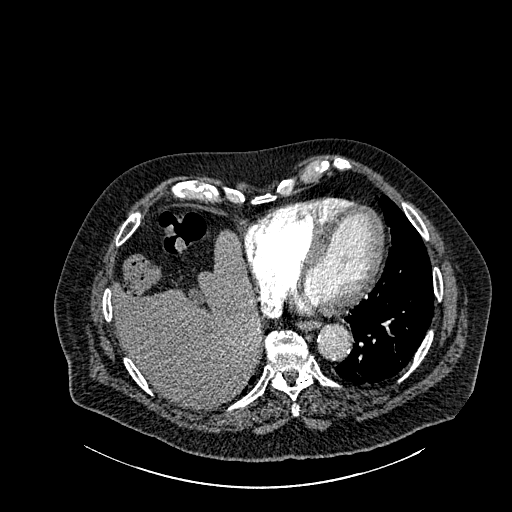
[im 118/303  lung]
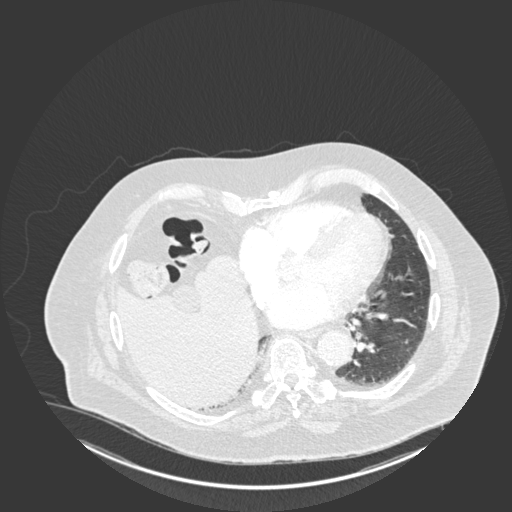
[im 135/303  mediastinal]
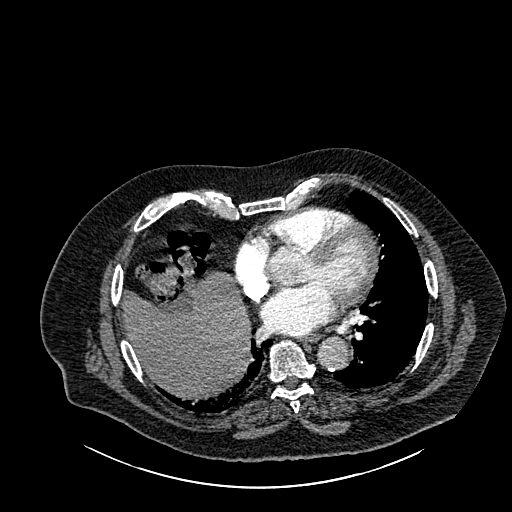
[im 152/303  lung]
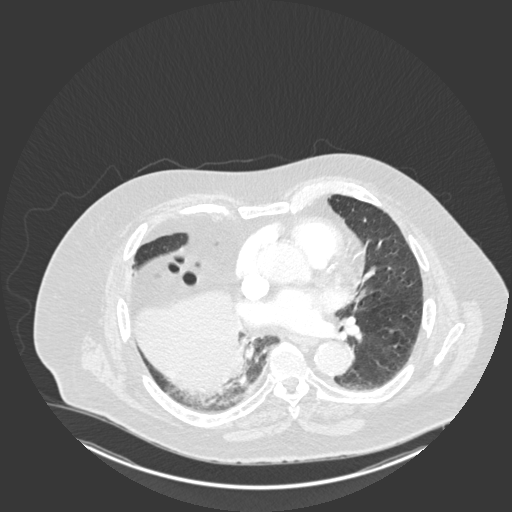
[im 168/303  mediastinal]
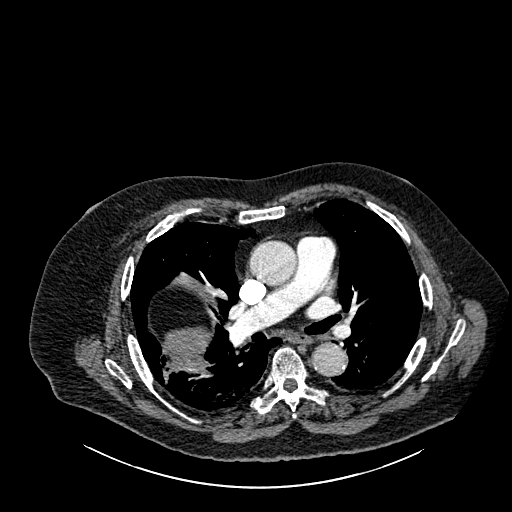
[im 185/303  lung]
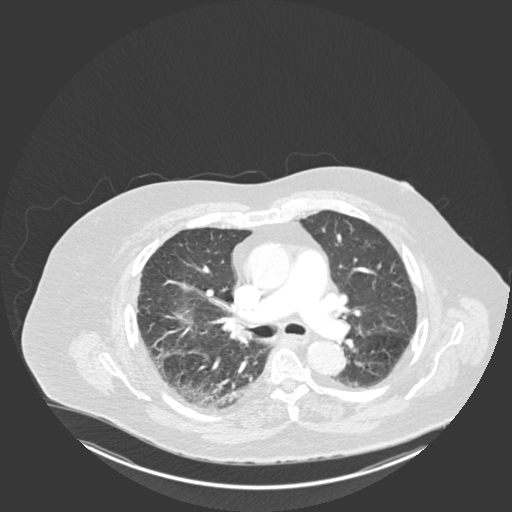
[im 202/303  mediastinal]
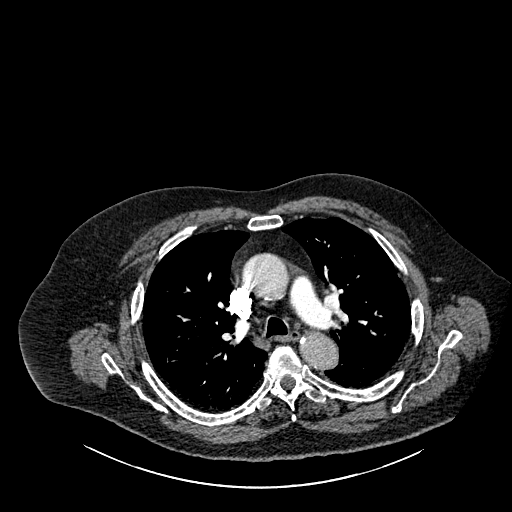
[im 219/303  lung]
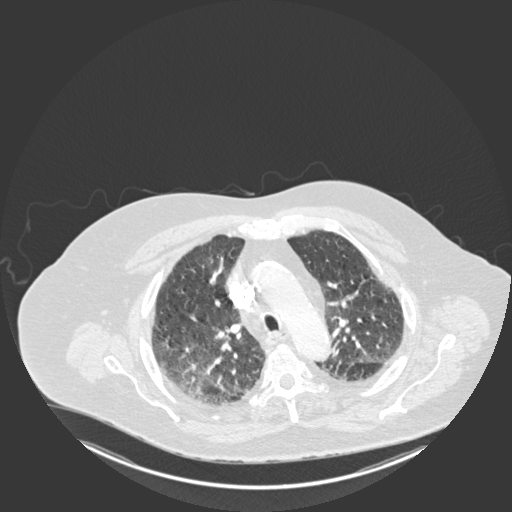
[im 235/303  mediastinal]
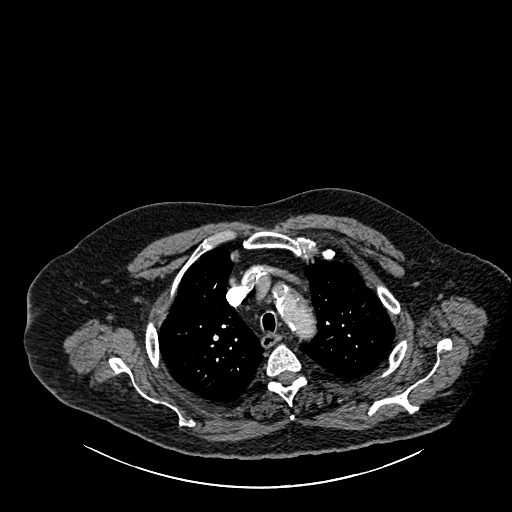
[im 252/303  lung]
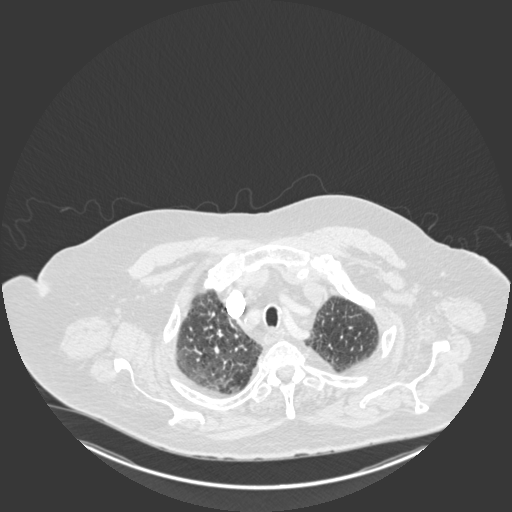
[im 269/303  mediastinal]
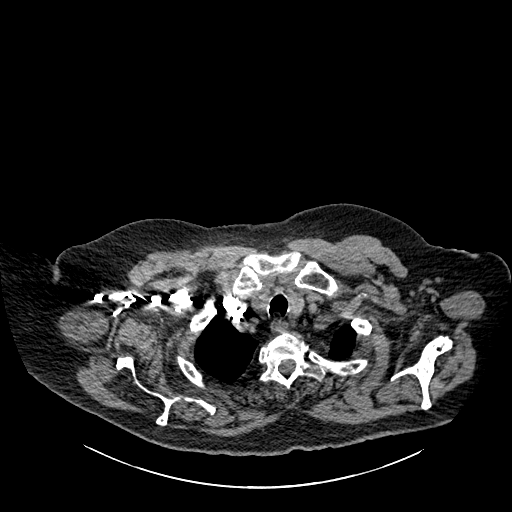
[im 286/303  lung]
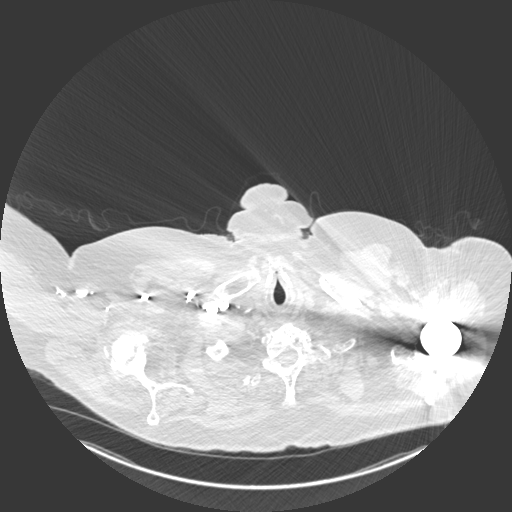

[Series 7: coronal mpr · coronal · 0.64mm/px · 1 of 172 slices shown]
[im 86/172  mediastinal]
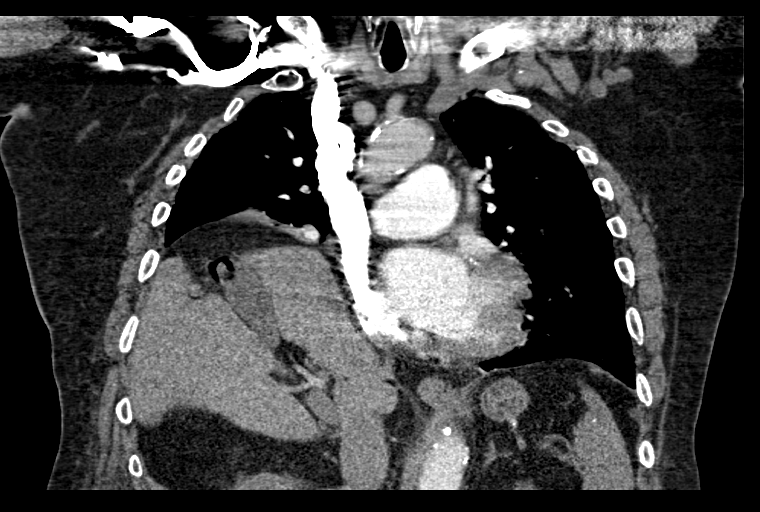

[18 of 36 positions shown; findings below may reference images not displayed]

RADIATION DOSE REDUCTION: This exam was performed according to the
departmental dose-optimization program which includes automated
exposure control, adjustment of the mA and/or kV according to
patient size and/or use of iterative reconstruction technique.

CONTRAST:  80mL OMNIPAQUE IOHEXOL 350 MG/ML SOLN
FINDINGS: Cardiovascular: There is homogeneous enhancement in thoracic aorta.
There is ectasia of ascending thoracic aorta measuring 4.6 cm.
Coronary artery calcifications are seen. There are no intraluminal
filling defects in the pulmonary artery branches. There is ectasia
of main pulmonary artery measuring 3.6 cm.

Mediastinum/Nodes: No significant lymphadenopathy is seen.

Lungs/Pleura: Right hemidiaphragm is elevated. There are linear
densities in the right lower lung fields. Subtle increased
interstitial markings are seen in the right parahilar region. There
are multiple blebs and bullae in the periphery of both lungs. There
is no focal pulmonary consolidation. There is no pleural effusion or
pneumothorax.

Upper Abdomen: There is fatty infiltration in the liver. Calcified
granulomas are seen in the spleen.

Musculoskeletal: Unremarkable.

Review of the MIP images confirms the above findings.
IMPRESSION: There is no evidence of pulmonary artery embolism. There is ectasia
of main pulmonary artery suggesting pulmonary arterial hypertension.
There is no evidence of thoracic aortic dissection. There is ectasia
of ascending thoracic aorta. Coronary artery calcifications are
seen.

Elevation of right hemidiaphragm may be due to eventration or
paralysis. Linear densities in the right lower lung fields suggest
scarring or subsegmental atelectasis. Subtle increase in
interstitial markings in the right parahilar region may suggest
scarring. Less likely possibility would be interstitial pneumonia.
Blebs and bullae are seen in the periphery of both lungs.

Fatty liver.

## 2022-02-23 IMAGING — CR DG RIBS W/ CHEST 3+V*R*
4 series · 4 of 4 positions shown · non-contrast
Comparison: 01/11/2017

CLINICAL DATA: Fall with skin tear

EXAM:
RIGHT RIBS AND CHEST - 3+ VIEW

[x chest ap (1 of 4)]
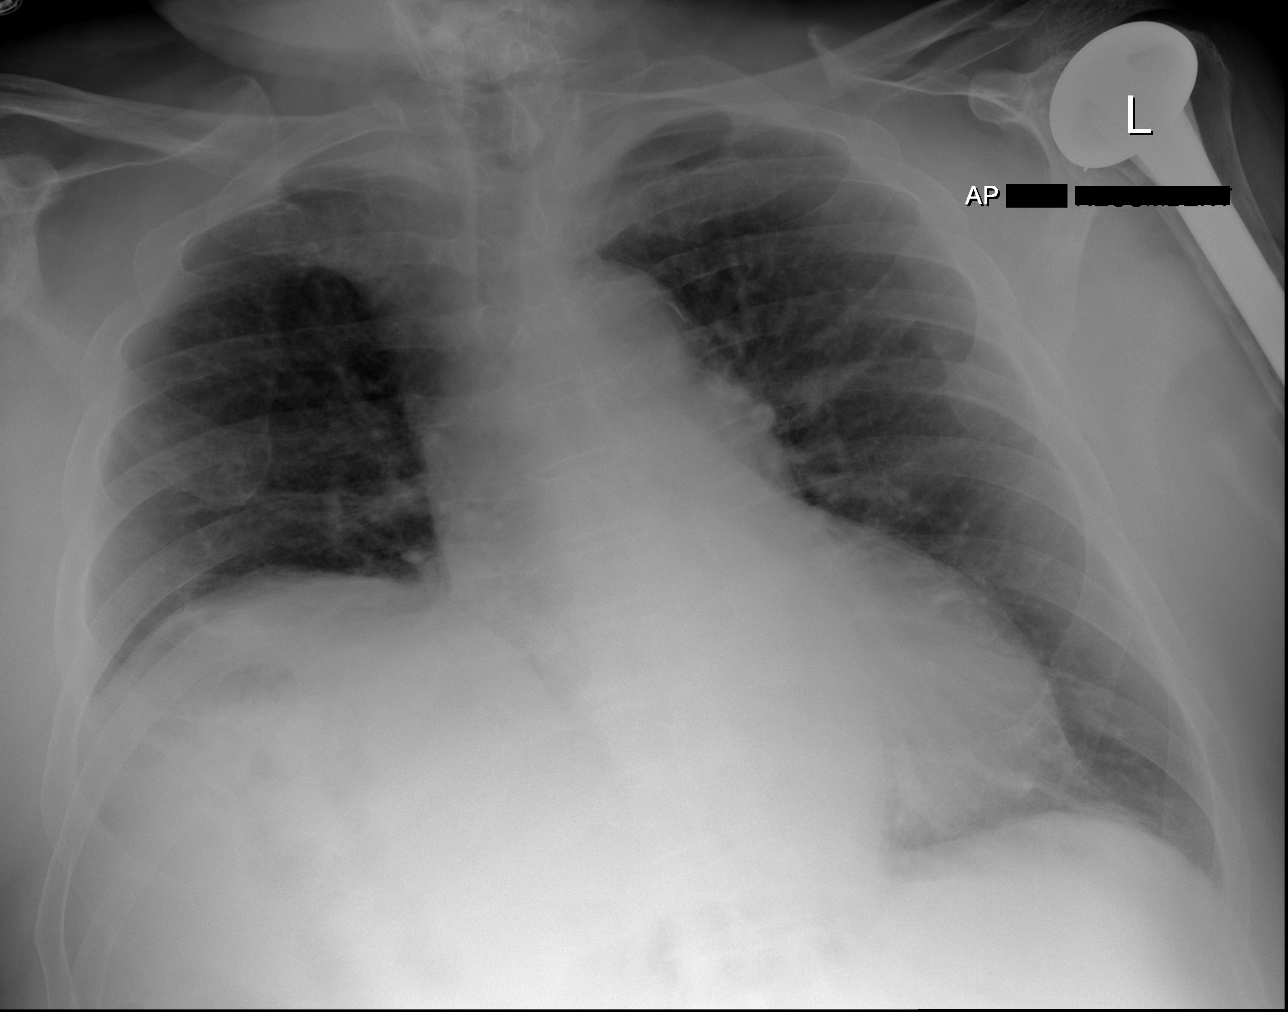

[x chest ap (2 of 4)]
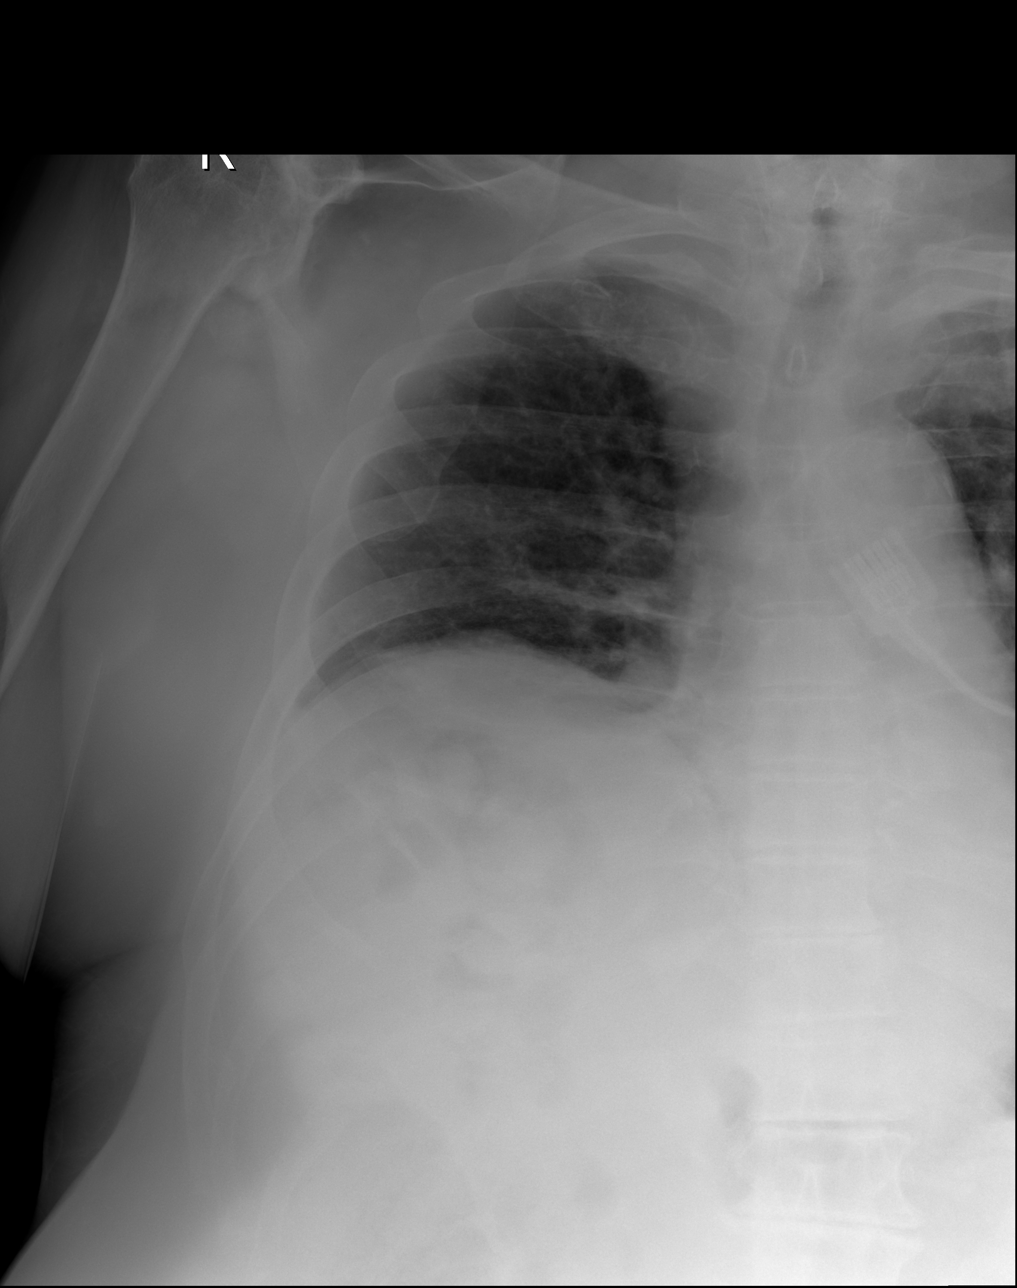

[x chest ap (3 of 4)]
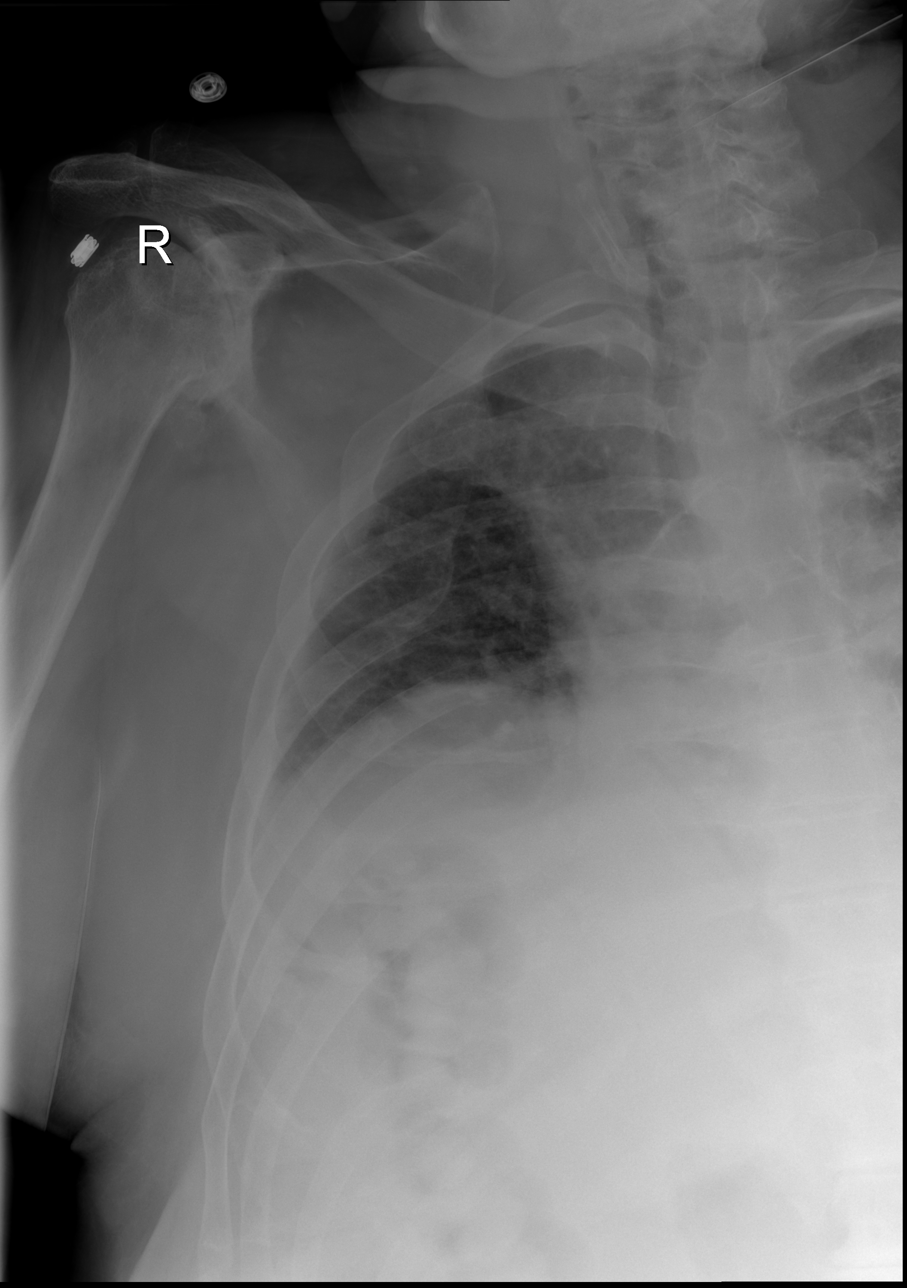

[x chest ap (4 of 4)]
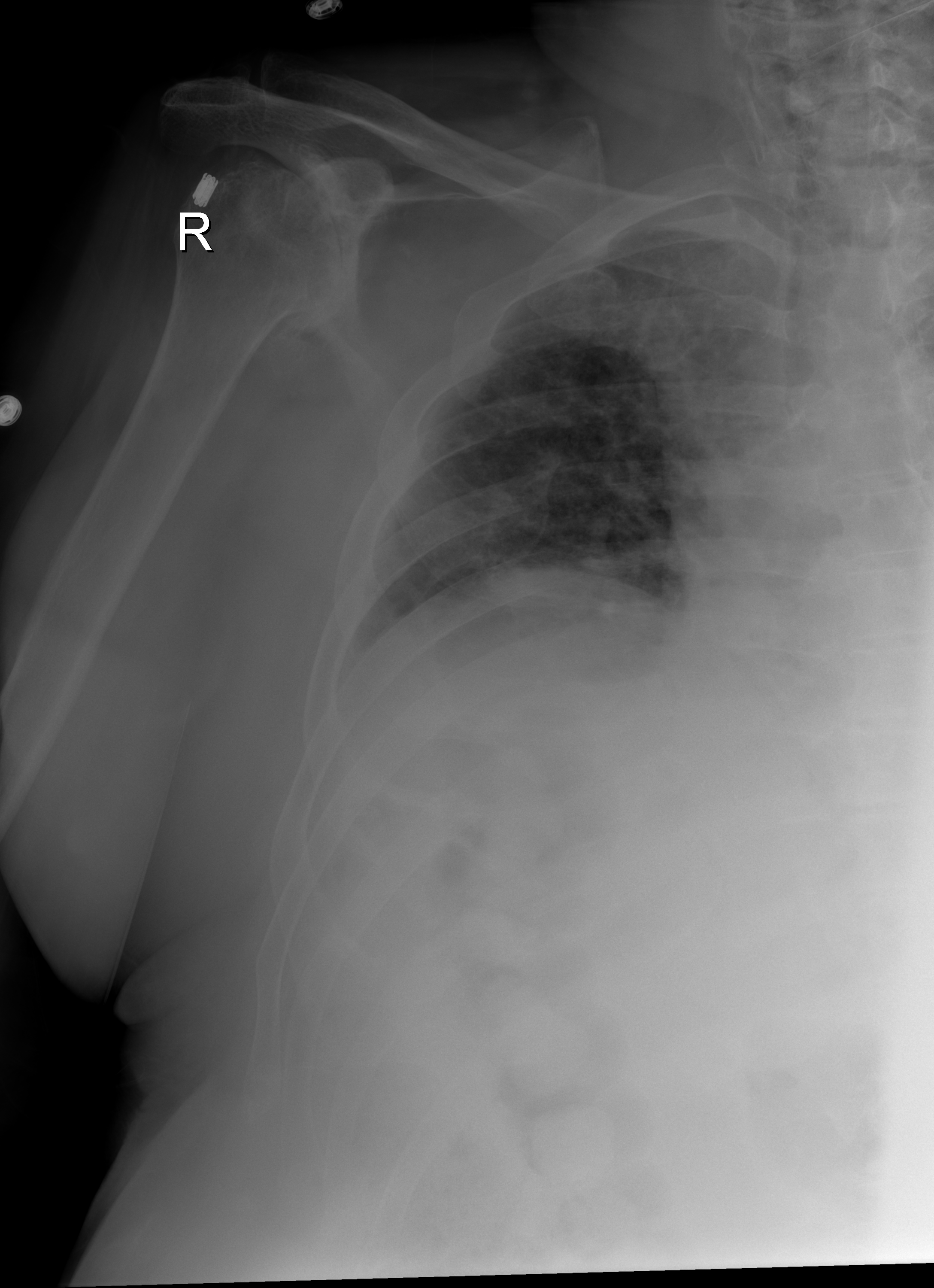

[4 of 4 positions shown; findings below may reference images not displayed]

FINDINGS: Single view chest demonstrates chronic elevation of right diaphragm
with subsegmental atelectasis. No acute consolidation, pleural
effusion or pneumothorax. Stable cardiomediastinal silhouette.

Right rib series demonstrates no acute displaced right rib fracture
IMPRESSION: Negative.

## 2022-02-24 DIAGNOSIS — D528 Other folate deficiency anemias: Secondary | ICD-10-CM | POA: Diagnosis not present

## 2022-02-24 DIAGNOSIS — M069 Rheumatoid arthritis, unspecified: Secondary | ICD-10-CM | POA: Diagnosis not present

## 2022-02-24 DIAGNOSIS — E782 Mixed hyperlipidemia: Secondary | ICD-10-CM | POA: Diagnosis not present

## 2022-02-24 DIAGNOSIS — E119 Type 2 diabetes mellitus without complications: Secondary | ICD-10-CM | POA: Diagnosis not present

## 2022-02-24 DIAGNOSIS — I10 Essential (primary) hypertension: Secondary | ICD-10-CM | POA: Diagnosis not present

## 2022-02-24 DIAGNOSIS — E559 Vitamin D deficiency, unspecified: Secondary | ICD-10-CM | POA: Diagnosis not present

## 2022-02-26 ENCOUNTER — Telehealth: Payer: Self-pay

## 2022-02-26 NOTE — Patient Outreach (Signed)
  Care Coordination   02/26/2022 Name: [name] MRN: [id number] DOB: 08-02-1932   Care Coordination Outreach Attempts:  An unsuccessful telephone outreach was attempted today to offer the patient information about available care coordination services as a benefit of their health plan.   Follow Up Plan:  Additional outreach attempts will be made to offer the patient care coordination information and services.   Encounter Outcome:  No Answer  Care Coordination Interventions Activated:  No   Care Coordination Interventions:  No, not indicated      Enzo Montgomery, RN,BSN,CCM [place] [facility] Telephonic Care Management Coordinator Direct Phone: [phone] Toll Free: [phone] Fax: [phone]

## 2022-03-02 ENCOUNTER — Telehealth: Payer: Self-pay

## 2022-03-02 NOTE — Patient Outreach (Signed)
  Care Coordination   Initial Visit Note   03/02/2022 Name: JAKEOB TULLIS MRN: [id number] DOB: 01-Feb-1933  THAMAS APPLEYARD is a 88 y.o. year old male who sees Pa, [place] for primary care. I spoke with  [name] by phone today.  What matters to the patients health and wellness today? Patient reported that he was currently in rehab center located in [place]. He voices things are going well but he is unsure of how longer he will be there.    Goals Addressed             This Visit's Progress    COMPLETED: Care Coordination-no follow up required       Care Coordination Interventions: Provided education to patient re: [facility] services Assessed social determinant of health barriers Encouraged pt to scheduled annual wellness visit as well as get immunizations         SDOH assessments and interventions completed:  Yes  SDOH Interventions Today    Flowsheet Row Most Recent Value  SDOH Interventions   Food Insecurity Interventions Intervention Not Indicated  Transportation Interventions Intervention Not Indicated        Care Coordination Interventions Activated:  Yes  Care Coordination Interventions:  Yes, provided   Follow up plan: No further intervention required.   Encounter Outcome:  Pt. Visit Completed   Enzo Montgomery, RN,BSN,CCM [place] [facility] Telephonic Care Management Coordinator Direct Phone: [phone] Toll Free: [phone] Fax: [phone]

## 2022-03-03 DIAGNOSIS — I87331 Chronic venous hypertension (idiopathic) with ulcer and inflammation of right lower extremity: Secondary | ICD-10-CM | POA: Diagnosis not present

## 2022-03-03 DIAGNOSIS — D529 Folate deficiency anemia, unspecified: Secondary | ICD-10-CM | POA: Diagnosis not present

## 2022-03-11 DIAGNOSIS — L97812 Non-pressure chronic ulcer of other part of right lower leg with fat layer exposed: Secondary | ICD-10-CM | POA: Diagnosis not present

## 2022-03-11 DIAGNOSIS — I87331 Chronic venous hypertension (idiopathic) with ulcer and inflammation of right lower extremity: Secondary | ICD-10-CM | POA: Diagnosis not present

## 2022-03-11 DIAGNOSIS — L03115 Cellulitis of right lower limb: Secondary | ICD-10-CM | POA: Diagnosis not present

## 2022-03-18 DIAGNOSIS — Z7982 Long term (current) use of aspirin: Secondary | ICD-10-CM | POA: Diagnosis not present

## 2022-03-18 DIAGNOSIS — J9600 Acute respiratory failure, unspecified whether with hypoxia or hypercapnia: Secondary | ICD-10-CM | POA: Diagnosis not present

## 2022-03-18 DIAGNOSIS — R300 Dysuria: Secondary | ICD-10-CM | POA: Diagnosis not present

## 2022-03-18 DIAGNOSIS — I10 Essential (primary) hypertension: Secondary | ICD-10-CM | POA: Diagnosis not present

## 2022-04-16 DIAGNOSIS — R2232 Localized swelling, mass and lump, left upper limb: Secondary | ICD-10-CM | POA: Diagnosis not present

## 2022-04-16 DIAGNOSIS — D529 Folate deficiency anemia, unspecified: Secondary | ICD-10-CM | POA: Diagnosis not present

## 2022-04-16 DIAGNOSIS — I87331 Chronic venous hypertension (idiopathic) with ulcer and inflammation of right lower extremity: Secondary | ICD-10-CM | POA: Diagnosis not present

## 2022-05-11 DIAGNOSIS — L03115 Cellulitis of right lower limb: Secondary | ICD-10-CM | POA: Diagnosis not present

## 2022-05-11 DIAGNOSIS — I87331 Chronic venous hypertension (idiopathic) with ulcer and inflammation of right lower extremity: Secondary | ICD-10-CM | POA: Diagnosis not present

## 2022-05-11 DIAGNOSIS — L97812 Non-pressure chronic ulcer of other part of right lower leg with fat layer exposed: Secondary | ICD-10-CM | POA: Diagnosis not present

## 2022-05-13 DIAGNOSIS — J9600 Acute respiratory failure, unspecified whether with hypoxia or hypercapnia: Secondary | ICD-10-CM | POA: Diagnosis not present

## 2022-05-13 DIAGNOSIS — T83098A Other mechanical complication of other indwelling urethral catheter, initial encounter: Secondary | ICD-10-CM | POA: Diagnosis not present

## 2022-05-16 DIAGNOSIS — Z20822 Contact with and (suspected) exposure to covid-19: Secondary | ICD-10-CM | POA: Diagnosis not present

## 2022-05-16 DIAGNOSIS — J189 Pneumonia, unspecified organism: Secondary | ICD-10-CM | POA: Diagnosis not present

## 2022-05-16 DIAGNOSIS — Z7409 Other reduced mobility: Secondary | ICD-10-CM | POA: Diagnosis not present

## 2022-05-16 DIAGNOSIS — E7800 Pure hypercholesterolemia, unspecified: Secondary | ICD-10-CM | POA: Diagnosis not present

## 2022-05-16 DIAGNOSIS — J90 Pleural effusion, not elsewhere classified: Secondary | ICD-10-CM | POA: Diagnosis not present

## 2022-05-16 DIAGNOSIS — I10 Essential (primary) hypertension: Secondary | ICD-10-CM | POA: Diagnosis not present

## 2022-05-16 DIAGNOSIS — I509 Heart failure, unspecified: Secondary | ICD-10-CM | POA: Diagnosis not present

## 2022-05-16 DIAGNOSIS — L89623 Pressure ulcer of left heel, stage 3: Secondary | ICD-10-CM | POA: Diagnosis not present

## 2022-05-16 DIAGNOSIS — N401 Enlarged prostate with lower urinary tract symptoms: Secondary | ICD-10-CM | POA: Diagnosis not present

## 2022-05-16 DIAGNOSIS — Z8679 Personal history of other diseases of the circulatory system: Secondary | ICD-10-CM | POA: Diagnosis not present

## 2022-05-16 DIAGNOSIS — J9811 Atelectasis: Secondary | ICD-10-CM | POA: Diagnosis not present

## 2022-05-16 DIAGNOSIS — J205 Acute bronchitis due to respiratory syncytial virus: Secondary | ICD-10-CM | POA: Diagnosis not present

## 2022-05-16 DIAGNOSIS — I5030 Unspecified diastolic (congestive) heart failure: Secondary | ICD-10-CM | POA: Diagnosis not present

## 2022-05-16 DIAGNOSIS — J159 Unspecified bacterial pneumonia: Secondary | ICD-10-CM | POA: Diagnosis not present

## 2022-05-16 DIAGNOSIS — I110 Hypertensive heart disease with heart failure: Secondary | ICD-10-CM | POA: Diagnosis not present

## 2022-05-16 DIAGNOSIS — J9601 Acute respiratory failure with hypoxia: Secondary | ICD-10-CM | POA: Diagnosis not present

## 2022-05-16 DIAGNOSIS — J9621 Acute and chronic respiratory failure with hypoxia: Secondary | ICD-10-CM | POA: Diagnosis not present

## 2022-05-16 DIAGNOSIS — J210 Acute bronchiolitis due to respiratory syncytial virus: Secondary | ICD-10-CM | POA: Diagnosis not present

## 2022-05-16 DIAGNOSIS — M6281 Muscle weakness (generalized): Secondary | ICD-10-CM | POA: Diagnosis not present

## 2022-05-16 DIAGNOSIS — R0902 Hypoxemia: Secondary | ICD-10-CM | POA: Diagnosis not present

## 2022-05-16 DIAGNOSIS — R339 Retention of urine, unspecified: Secondary | ICD-10-CM | POA: Diagnosis not present

## 2022-05-16 DIAGNOSIS — I5032 Chronic diastolic (congestive) heart failure: Secondary | ICD-10-CM | POA: Diagnosis not present

## 2022-05-16 DIAGNOSIS — R7989 Other specified abnormal findings of blood chemistry: Secondary | ICD-10-CM | POA: Diagnosis not present

## 2022-05-16 DIAGNOSIS — I5040 Unspecified combined systolic (congestive) and diastolic (congestive) heart failure: Secondary | ICD-10-CM | POA: Diagnosis not present

## 2022-05-16 DIAGNOSIS — R000 Tachycardia, unspecified: Secondary | ICD-10-CM | POA: Diagnosis not present

## 2022-05-16 DIAGNOSIS — I4510 Unspecified right bundle-branch block: Secondary | ICD-10-CM | POA: Diagnosis not present

## 2022-05-16 DIAGNOSIS — B338 Other specified viral diseases: Secondary | ICD-10-CM | POA: Diagnosis not present

## 2022-05-16 DIAGNOSIS — R062 Wheezing: Secondary | ICD-10-CM | POA: Diagnosis not present

## 2022-06-04 DIAGNOSIS — R269 Unspecified abnormalities of gait and mobility: Secondary | ICD-10-CM | POA: Diagnosis not present

## 2022-06-04 DIAGNOSIS — I5032 Chronic diastolic (congestive) heart failure: Secondary | ICD-10-CM | POA: Diagnosis not present

## 2022-06-04 DIAGNOSIS — I110 Hypertensive heart disease with heart failure: Secondary | ICD-10-CM | POA: Diagnosis not present

## 2022-06-04 DIAGNOSIS — F330 Major depressive disorder, recurrent, mild: Secondary | ICD-10-CM | POA: Diagnosis not present

## 2022-06-04 DIAGNOSIS — R339 Retention of urine, unspecified: Secondary | ICD-10-CM | POA: Diagnosis not present

## 2022-06-04 DIAGNOSIS — J9600 Acute respiratory failure, unspecified whether with hypoxia or hypercapnia: Secondary | ICD-10-CM | POA: Diagnosis not present

## 2022-06-10 DIAGNOSIS — N138 Other obstructive and reflux uropathy: Secondary | ICD-10-CM | POA: Diagnosis not present

## 2022-06-10 DIAGNOSIS — I10 Essential (primary) hypertension: Secondary | ICD-10-CM | POA: Diagnosis not present

## 2022-06-10 DIAGNOSIS — I5032 Chronic diastolic (congestive) heart failure: Secondary | ICD-10-CM | POA: Diagnosis not present

## 2022-06-10 DIAGNOSIS — R339 Retention of urine, unspecified: Secondary | ICD-10-CM | POA: Diagnosis not present

## 2022-06-10 DIAGNOSIS — J9611 Chronic respiratory failure with hypoxia: Secondary | ICD-10-CM | POA: Diagnosis not present

## 2022-06-10 DIAGNOSIS — N401 Enlarged prostate with lower urinary tract symptoms: Secondary | ICD-10-CM | POA: Diagnosis not present

## 2022-06-10 DIAGNOSIS — L89899 Pressure ulcer of other site, unspecified stage: Secondary | ICD-10-CM | POA: Diagnosis not present

## 2022-06-11 DIAGNOSIS — R339 Retention of urine, unspecified: Secondary | ICD-10-CM | POA: Diagnosis not present

## 2022-06-11 DIAGNOSIS — F330 Major depressive disorder, recurrent, mild: Secondary | ICD-10-CM | POA: Diagnosis not present

## 2022-06-11 DIAGNOSIS — R269 Unspecified abnormalities of gait and mobility: Secondary | ICD-10-CM | POA: Diagnosis not present

## 2022-06-11 DIAGNOSIS — I5032 Chronic diastolic (congestive) heart failure: Secondary | ICD-10-CM | POA: Diagnosis not present

## 2022-06-11 DIAGNOSIS — I110 Hypertensive heart disease with heart failure: Secondary | ICD-10-CM | POA: Diagnosis not present

## 2022-06-13 DIAGNOSIS — I5030 Unspecified diastolic (congestive) heart failure: Secondary | ICD-10-CM | POA: Diagnosis not present

## 2022-06-13 DIAGNOSIS — L97929 Non-pressure chronic ulcer of unspecified part of left lower leg with unspecified severity: Secondary | ICD-10-CM | POA: Diagnosis not present

## 2022-06-13 DIAGNOSIS — L03115 Cellulitis of right lower limb: Secondary | ICD-10-CM | POA: Diagnosis not present

## 2022-06-13 DIAGNOSIS — I87332 Chronic venous hypertension (idiopathic) with ulcer and inflammation of left lower extremity: Secondary | ICD-10-CM | POA: Diagnosis not present

## 2022-06-13 DIAGNOSIS — J9611 Chronic respiratory failure with hypoxia: Secondary | ICD-10-CM | POA: Diagnosis not present

## 2022-06-13 DIAGNOSIS — Z9181 History of falling: Secondary | ICD-10-CM | POA: Diagnosis not present

## 2022-06-13 DIAGNOSIS — R262 Difficulty in walking, not elsewhere classified: Secondary | ICD-10-CM | POA: Diagnosis not present

## 2022-06-13 DIAGNOSIS — R2681 Unsteadiness on feet: Secondary | ICD-10-CM | POA: Diagnosis not present

## 2022-06-13 DIAGNOSIS — M6281 Muscle weakness (generalized): Secondary | ICD-10-CM | POA: Diagnosis not present

## 2022-06-15 DIAGNOSIS — I5032 Chronic diastolic (congestive) heart failure: Secondary | ICD-10-CM | POA: Diagnosis not present

## 2022-06-15 DIAGNOSIS — R339 Retention of urine, unspecified: Secondary | ICD-10-CM | POA: Diagnosis not present

## 2022-06-15 DIAGNOSIS — T83098A Other mechanical complication of other indwelling urethral catheter, initial encounter: Secondary | ICD-10-CM | POA: Diagnosis not present

## 2022-06-15 DIAGNOSIS — R269 Unspecified abnormalities of gait and mobility: Secondary | ICD-10-CM | POA: Diagnosis not present

## 2022-06-15 DIAGNOSIS — I110 Hypertensive heart disease with heart failure: Secondary | ICD-10-CM | POA: Diagnosis not present

## 2022-06-17 DIAGNOSIS — T83098A Other mechanical complication of other indwelling urethral catheter, initial encounter: Secondary | ICD-10-CM | POA: Diagnosis not present

## 2022-06-17 DIAGNOSIS — J9620 Acute and chronic respiratory failure, unspecified whether with hypoxia or hypercapnia: Secondary | ICD-10-CM | POA: Diagnosis not present

## 2022-06-24 DIAGNOSIS — J9611 Chronic respiratory failure with hypoxia: Secondary | ICD-10-CM | POA: Diagnosis not present

## 2022-06-24 DIAGNOSIS — I5032 Chronic diastolic (congestive) heart failure: Secondary | ICD-10-CM | POA: Diagnosis not present

## 2022-06-24 DIAGNOSIS — R339 Retention of urine, unspecified: Secondary | ICD-10-CM | POA: Diagnosis not present

## 2022-06-24 DIAGNOSIS — I110 Hypertensive heart disease with heart failure: Secondary | ICD-10-CM | POA: Diagnosis not present

## 2022-07-10 DIAGNOSIS — J9620 Acute and chronic respiratory failure, unspecified whether with hypoxia or hypercapnia: Secondary | ICD-10-CM | POA: Diagnosis not present

## 2022-07-10 DIAGNOSIS — T83098A Other mechanical complication of other indwelling urethral catheter, initial encounter: Secondary | ICD-10-CM | POA: Diagnosis not present

## 2022-07-13 DIAGNOSIS — S91104D Unspecified open wound of right lesser toe(s) without damage to nail, subsequent encounter: Secondary | ICD-10-CM | POA: Diagnosis not present

## 2022-07-13 DIAGNOSIS — S91302D Unspecified open wound, left foot, subsequent encounter: Secondary | ICD-10-CM | POA: Diagnosis not present

## 2022-07-13 DIAGNOSIS — J9620 Acute and chronic respiratory failure, unspecified whether with hypoxia or hypercapnia: Secondary | ICD-10-CM | POA: Diagnosis not present

## 2022-07-14 DIAGNOSIS — I87332 Chronic venous hypertension (idiopathic) with ulcer and inflammation of left lower extremity: Secondary | ICD-10-CM | POA: Diagnosis not present

## 2022-07-14 DIAGNOSIS — L03115 Cellulitis of right lower limb: Secondary | ICD-10-CM | POA: Diagnosis not present

## 2022-07-14 DIAGNOSIS — R262 Difficulty in walking, not elsewhere classified: Secondary | ICD-10-CM | POA: Diagnosis not present

## 2022-07-14 DIAGNOSIS — M6281 Muscle weakness (generalized): Secondary | ICD-10-CM | POA: Diagnosis not present

## 2022-07-14 DIAGNOSIS — Z9181 History of falling: Secondary | ICD-10-CM | POA: Diagnosis not present

## 2022-07-14 DIAGNOSIS — R2681 Unsteadiness on feet: Secondary | ICD-10-CM | POA: Diagnosis not present

## 2022-07-14 DIAGNOSIS — L97929 Non-pressure chronic ulcer of unspecified part of left lower leg with unspecified severity: Secondary | ICD-10-CM | POA: Diagnosis not present

## 2022-07-14 DIAGNOSIS — I5030 Unspecified diastolic (congestive) heart failure: Secondary | ICD-10-CM | POA: Diagnosis not present

## 2022-07-14 DIAGNOSIS — J9611 Chronic respiratory failure with hypoxia: Secondary | ICD-10-CM | POA: Diagnosis not present

## 2022-07-16 DIAGNOSIS — I110 Hypertensive heart disease with heart failure: Secondary | ICD-10-CM | POA: Diagnosis not present

## 2022-07-16 DIAGNOSIS — R269 Unspecified abnormalities of gait and mobility: Secondary | ICD-10-CM | POA: Diagnosis not present

## 2022-07-16 DIAGNOSIS — R339 Retention of urine, unspecified: Secondary | ICD-10-CM | POA: Diagnosis not present

## 2022-07-16 DIAGNOSIS — F330 Major depressive disorder, recurrent, mild: Secondary | ICD-10-CM | POA: Diagnosis not present

## 2022-07-16 DIAGNOSIS — J9611 Chronic respiratory failure with hypoxia: Secondary | ICD-10-CM | POA: Diagnosis not present

## 2022-07-17 DIAGNOSIS — R339 Retention of urine, unspecified: Secondary | ICD-10-CM | POA: Diagnosis not present

## 2022-07-20 DIAGNOSIS — I110 Hypertensive heart disease with heart failure: Secondary | ICD-10-CM | POA: Diagnosis not present

## 2022-07-20 DIAGNOSIS — J9611 Chronic respiratory failure with hypoxia: Secondary | ICD-10-CM | POA: Diagnosis not present

## 2022-07-20 DIAGNOSIS — L03116 Cellulitis of left lower limb: Secondary | ICD-10-CM | POA: Diagnosis not present

## 2022-07-20 DIAGNOSIS — F330 Major depressive disorder, recurrent, mild: Secondary | ICD-10-CM | POA: Diagnosis not present

## 2022-07-20 DIAGNOSIS — R300 Dysuria: Secondary | ICD-10-CM | POA: Diagnosis not present

## 2022-07-20 DIAGNOSIS — R339 Retention of urine, unspecified: Secondary | ICD-10-CM | POA: Diagnosis not present

## 2022-07-20 DIAGNOSIS — R269 Unspecified abnormalities of gait and mobility: Secondary | ICD-10-CM | POA: Diagnosis not present

## 2022-07-27 DIAGNOSIS — L03116 Cellulitis of left lower limb: Secondary | ICD-10-CM | POA: Diagnosis not present

## 2022-07-27 DIAGNOSIS — I110 Hypertensive heart disease with heart failure: Secondary | ICD-10-CM | POA: Diagnosis not present

## 2022-07-27 DIAGNOSIS — J9611 Chronic respiratory failure with hypoxia: Secondary | ICD-10-CM | POA: Diagnosis not present

## 2022-07-27 DIAGNOSIS — R269 Unspecified abnormalities of gait and mobility: Secondary | ICD-10-CM | POA: Diagnosis not present

## 2022-07-27 DIAGNOSIS — R339 Retention of urine, unspecified: Secondary | ICD-10-CM | POA: Diagnosis not present

## 2022-07-27 DIAGNOSIS — S91302D Unspecified open wound, left foot, subsequent encounter: Secondary | ICD-10-CM | POA: Diagnosis not present

## 2022-08-02 DIAGNOSIS — Z743 Need for continuous supervision: Secondary | ICD-10-CM | POA: Diagnosis not present

## 2022-08-02 DIAGNOSIS — Z7982 Long term (current) use of aspirin: Secondary | ICD-10-CM | POA: Diagnosis not present

## 2022-08-02 DIAGNOSIS — I509 Heart failure, unspecified: Secondary | ICD-10-CM | POA: Diagnosis not present

## 2022-08-02 DIAGNOSIS — I110 Hypertensive heart disease with heart failure: Secondary | ICD-10-CM | POA: Diagnosis not present

## 2022-08-02 DIAGNOSIS — E871 Hypo-osmolality and hyponatremia: Secondary | ICD-10-CM | POA: Diagnosis not present

## 2022-08-02 DIAGNOSIS — R29898 Other symptoms and signs involving the musculoskeletal system: Secondary | ICD-10-CM | POA: Diagnosis not present

## 2022-08-02 DIAGNOSIS — R339 Retention of urine, unspecified: Secondary | ICD-10-CM | POA: Diagnosis not present

## 2022-08-02 DIAGNOSIS — D649 Anemia, unspecified: Secondary | ICD-10-CM | POA: Diagnosis not present

## 2022-08-02 DIAGNOSIS — R531 Weakness: Secondary | ICD-10-CM | POA: Diagnosis not present

## 2022-08-02 DIAGNOSIS — N368 Other specified disorders of urethra: Secondary | ICD-10-CM | POA: Diagnosis not present

## 2022-08-02 DIAGNOSIS — I959 Hypotension, unspecified: Secondary | ICD-10-CM | POA: Diagnosis not present

## 2022-08-03 DIAGNOSIS — L97929 Non-pressure chronic ulcer of unspecified part of left lower leg with unspecified severity: Secondary | ICD-10-CM | POA: Diagnosis not present

## 2022-08-03 DIAGNOSIS — E872 Acidosis, unspecified: Secondary | ICD-10-CM | POA: Diagnosis not present

## 2022-08-03 DIAGNOSIS — M19072 Primary osteoarthritis, left ankle and foot: Secondary | ICD-10-CM | POA: Diagnosis not present

## 2022-08-03 DIAGNOSIS — R279 Unspecified lack of coordination: Secondary | ICD-10-CM | POA: Diagnosis not present

## 2022-08-03 DIAGNOSIS — I5030 Unspecified diastolic (congestive) heart failure: Secondary | ICD-10-CM | POA: Diagnosis not present

## 2022-08-03 DIAGNOSIS — I130 Hypertensive heart and chronic kidney disease with heart failure and stage 1 through stage 4 chronic kidney disease, or unspecified chronic kidney disease: Secondary | ICD-10-CM | POA: Diagnosis not present

## 2022-08-03 DIAGNOSIS — L03116 Cellulitis of left lower limb: Secondary | ICD-10-CM | POA: Diagnosis not present

## 2022-08-03 DIAGNOSIS — T83511A Infection and inflammatory reaction due to indwelling urethral catheter, initial encounter: Secondary | ICD-10-CM | POA: Diagnosis not present

## 2022-08-03 DIAGNOSIS — I480 Paroxysmal atrial fibrillation: Secondary | ICD-10-CM | POA: Diagnosis not present

## 2022-08-03 DIAGNOSIS — A419 Sepsis, unspecified organism: Secondary | ICD-10-CM | POA: Diagnosis not present

## 2022-08-03 DIAGNOSIS — I4510 Unspecified right bundle-branch block: Secondary | ICD-10-CM | POA: Diagnosis not present

## 2022-08-03 DIAGNOSIS — M869 Osteomyelitis, unspecified: Secondary | ICD-10-CM | POA: Diagnosis not present

## 2022-08-03 DIAGNOSIS — R2681 Unsteadiness on feet: Secondary | ICD-10-CM | POA: Diagnosis not present

## 2022-08-03 DIAGNOSIS — M86172 Other acute osteomyelitis, left ankle and foot: Secondary | ICD-10-CM | POA: Diagnosis not present

## 2022-08-03 DIAGNOSIS — I959 Hypotension, unspecified: Secondary | ICD-10-CM | POA: Diagnosis not present

## 2022-08-03 DIAGNOSIS — I87332 Chronic venous hypertension (idiopathic) with ulcer and inflammation of left lower extremity: Secondary | ICD-10-CM | POA: Diagnosis not present

## 2022-08-03 DIAGNOSIS — E871 Hypo-osmolality and hyponatremia: Secondary | ICD-10-CM | POA: Diagnosis not present

## 2022-08-03 DIAGNOSIS — R937 Abnormal findings on diagnostic imaging of other parts of musculoskeletal system: Secondary | ICD-10-CM | POA: Diagnosis not present

## 2022-08-03 DIAGNOSIS — L97529 Non-pressure chronic ulcer of other part of left foot with unspecified severity: Secondary | ICD-10-CM | POA: Diagnosis not present

## 2022-08-03 DIAGNOSIS — I9589 Other hypotension: Secondary | ICD-10-CM | POA: Diagnosis not present

## 2022-08-03 DIAGNOSIS — Z9181 History of falling: Secondary | ICD-10-CM | POA: Diagnosis not present

## 2022-08-03 DIAGNOSIS — N390 Urinary tract infection, site not specified: Secondary | ICD-10-CM | POA: Diagnosis not present

## 2022-08-03 DIAGNOSIS — R262 Difficulty in walking, not elsewhere classified: Secondary | ICD-10-CM | POA: Diagnosis not present

## 2022-08-03 DIAGNOSIS — E8809 Other disorders of plasma-protein metabolism, not elsewhere classified: Secondary | ICD-10-CM | POA: Diagnosis not present

## 2022-08-03 DIAGNOSIS — R9431 Abnormal electrocardiogram [ECG] [EKG]: Secondary | ICD-10-CM | POA: Diagnosis not present

## 2022-08-03 DIAGNOSIS — I440 Atrioventricular block, first degree: Secondary | ICD-10-CM | POA: Diagnosis not present

## 2022-08-03 DIAGNOSIS — R110 Nausea: Secondary | ICD-10-CM | POA: Diagnosis not present

## 2022-08-03 DIAGNOSIS — R6521 Severe sepsis with septic shock: Secondary | ICD-10-CM | POA: Diagnosis not present

## 2022-08-03 DIAGNOSIS — J9811 Atelectasis: Secondary | ICD-10-CM | POA: Diagnosis not present

## 2022-08-03 DIAGNOSIS — E861 Hypovolemia: Secondary | ICD-10-CM | POA: Diagnosis not present

## 2022-08-03 DIAGNOSIS — L089 Local infection of the skin and subcutaneous tissue, unspecified: Secondary | ICD-10-CM | POA: Diagnosis not present

## 2022-08-03 DIAGNOSIS — L97524 Non-pressure chronic ulcer of other part of left foot with necrosis of bone: Secondary | ICD-10-CM | POA: Diagnosis not present

## 2022-08-03 DIAGNOSIS — L03115 Cellulitis of right lower limb: Secondary | ICD-10-CM | POA: Diagnosis not present

## 2022-08-03 DIAGNOSIS — I517 Cardiomegaly: Secondary | ICD-10-CM | POA: Diagnosis not present

## 2022-08-03 DIAGNOSIS — M6281 Muscle weakness (generalized): Secondary | ICD-10-CM | POA: Diagnosis not present

## 2022-08-03 DIAGNOSIS — J9611 Chronic respiratory failure with hypoxia: Secondary | ICD-10-CM | POA: Diagnosis not present

## 2022-08-03 DIAGNOSIS — Z66 Do not resuscitate: Secondary | ICD-10-CM | POA: Diagnosis not present

## 2022-08-03 DIAGNOSIS — I96 Gangrene, not elsewhere classified: Secondary | ICD-10-CM | POA: Diagnosis not present

## 2022-08-03 DIAGNOSIS — G8918 Other acute postprocedural pain: Secondary | ICD-10-CM | POA: Diagnosis not present

## 2022-08-03 DIAGNOSIS — N179 Acute kidney failure, unspecified: Secondary | ICD-10-CM | POA: Diagnosis not present

## 2022-08-03 DIAGNOSIS — R339 Retention of urine, unspecified: Secondary | ICD-10-CM | POA: Diagnosis not present

## 2022-08-13 DIAGNOSIS — R269 Unspecified abnormalities of gait and mobility: Secondary | ICD-10-CM | POA: Diagnosis not present

## 2022-08-13 DIAGNOSIS — F330 Major depressive disorder, recurrent, mild: Secondary | ICD-10-CM | POA: Diagnosis not present

## 2022-08-13 DIAGNOSIS — J9611 Chronic respiratory failure with hypoxia: Secondary | ICD-10-CM | POA: Diagnosis not present

## 2022-08-13 DIAGNOSIS — I5032 Chronic diastolic (congestive) heart failure: Secondary | ICD-10-CM | POA: Diagnosis not present

## 2022-08-13 DIAGNOSIS — R339 Retention of urine, unspecified: Secondary | ICD-10-CM | POA: Diagnosis not present

## 2022-08-13 DIAGNOSIS — N3000 Acute cystitis without hematuria: Secondary | ICD-10-CM | POA: Diagnosis not present

## 2022-08-13 DIAGNOSIS — J9620 Acute and chronic respiratory failure, unspecified whether with hypoxia or hypercapnia: Secondary | ICD-10-CM | POA: Diagnosis not present

## 2022-08-13 DIAGNOSIS — I110 Hypertensive heart disease with heart failure: Secondary | ICD-10-CM | POA: Diagnosis not present

## 2022-08-13 DIAGNOSIS — T83098A Other mechanical complication of other indwelling urethral catheter, initial encounter: Secondary | ICD-10-CM | POA: Diagnosis not present

## 2022-08-17 DIAGNOSIS — R269 Unspecified abnormalities of gait and mobility: Secondary | ICD-10-CM | POA: Diagnosis not present

## 2022-08-17 DIAGNOSIS — N3000 Acute cystitis without hematuria: Secondary | ICD-10-CM | POA: Diagnosis not present

## 2022-08-17 DIAGNOSIS — R339 Retention of urine, unspecified: Secondary | ICD-10-CM | POA: Diagnosis not present

## 2022-08-17 DIAGNOSIS — J9611 Chronic respiratory failure with hypoxia: Secondary | ICD-10-CM | POA: Diagnosis not present

## 2022-08-17 DIAGNOSIS — I110 Hypertensive heart disease with heart failure: Secondary | ICD-10-CM | POA: Diagnosis not present

## 2022-08-17 DIAGNOSIS — I5032 Chronic diastolic (congestive) heart failure: Secondary | ICD-10-CM | POA: Diagnosis not present

## 2022-08-20 DIAGNOSIS — S91104D Unspecified open wound of right lesser toe(s) without damage to nail, subsequent encounter: Secondary | ICD-10-CM | POA: Diagnosis not present

## 2022-08-20 DIAGNOSIS — I110 Hypertensive heart disease with heart failure: Secondary | ICD-10-CM | POA: Diagnosis not present

## 2022-08-20 DIAGNOSIS — F330 Major depressive disorder, recurrent, mild: Secondary | ICD-10-CM | POA: Diagnosis not present

## 2022-08-20 DIAGNOSIS — I5032 Chronic diastolic (congestive) heart failure: Secondary | ICD-10-CM | POA: Diagnosis not present

## 2022-08-20 DIAGNOSIS — N3000 Acute cystitis without hematuria: Secondary | ICD-10-CM | POA: Diagnosis not present

## 2022-08-20 DIAGNOSIS — R269 Unspecified abnormalities of gait and mobility: Secondary | ICD-10-CM | POA: Diagnosis not present

## 2022-08-20 DIAGNOSIS — J9611 Chronic respiratory failure with hypoxia: Secondary | ICD-10-CM | POA: Diagnosis not present

## 2022-08-21 DIAGNOSIS — R21 Rash and other nonspecific skin eruption: Secondary | ICD-10-CM | POA: Diagnosis not present

## 2022-08-26 DIAGNOSIS — G629 Polyneuropathy, unspecified: Secondary | ICD-10-CM | POA: Diagnosis not present

## 2022-08-26 DIAGNOSIS — L97522 Non-pressure chronic ulcer of other part of left foot with fat layer exposed: Secondary | ICD-10-CM | POA: Diagnosis not present

## 2022-08-26 DIAGNOSIS — S81812A Laceration without foreign body, left lower leg, initial encounter: Secondary | ICD-10-CM | POA: Diagnosis not present

## 2022-08-26 DIAGNOSIS — T8189XA Other complications of procedures, not elsewhere classified, initial encounter: Secondary | ICD-10-CM | POA: Diagnosis not present

## 2022-08-26 DIAGNOSIS — S81811A Laceration without foreign body, right lower leg, initial encounter: Secondary | ICD-10-CM | POA: Diagnosis not present

## 2022-08-26 DIAGNOSIS — L97512 Non-pressure chronic ulcer of other part of right foot with fat layer exposed: Secondary | ICD-10-CM | POA: Diagnosis not present

## 2022-08-26 DIAGNOSIS — Z87891 Personal history of nicotine dependence: Secondary | ICD-10-CM | POA: Diagnosis not present

## 2022-08-27 DIAGNOSIS — F330 Major depressive disorder, recurrent, mild: Secondary | ICD-10-CM | POA: Diagnosis not present

## 2022-08-27 DIAGNOSIS — S91104D Unspecified open wound of right lesser toe(s) without damage to nail, subsequent encounter: Secondary | ICD-10-CM | POA: Diagnosis not present

## 2022-08-27 DIAGNOSIS — J9611 Chronic respiratory failure with hypoxia: Secondary | ICD-10-CM | POA: Diagnosis not present

## 2022-08-27 DIAGNOSIS — R269 Unspecified abnormalities of gait and mobility: Secondary | ICD-10-CM | POA: Diagnosis not present

## 2022-08-27 DIAGNOSIS — N3000 Acute cystitis without hematuria: Secondary | ICD-10-CM | POA: Diagnosis not present

## 2022-08-27 DIAGNOSIS — L03116 Cellulitis of left lower limb: Secondary | ICD-10-CM | POA: Diagnosis not present

## 2022-08-27 DIAGNOSIS — R339 Retention of urine, unspecified: Secondary | ICD-10-CM | POA: Diagnosis not present

## 2022-08-27 DIAGNOSIS — B028 Zoster with other complications: Secondary | ICD-10-CM | POA: Diagnosis not present

## 2022-08-27 DIAGNOSIS — I110 Hypertensive heart disease with heart failure: Secondary | ICD-10-CM | POA: Diagnosis not present

## 2022-08-31 DIAGNOSIS — I110 Hypertensive heart disease with heart failure: Secondary | ICD-10-CM | POA: Diagnosis not present

## 2022-08-31 DIAGNOSIS — R269 Unspecified abnormalities of gait and mobility: Secondary | ICD-10-CM | POA: Diagnosis not present

## 2022-08-31 DIAGNOSIS — B028 Zoster with other complications: Secondary | ICD-10-CM | POA: Diagnosis not present

## 2022-08-31 DIAGNOSIS — J9611 Chronic respiratory failure with hypoxia: Secondary | ICD-10-CM | POA: Diagnosis not present

## 2022-08-31 DIAGNOSIS — S91104D Unspecified open wound of right lesser toe(s) without damage to nail, subsequent encounter: Secondary | ICD-10-CM | POA: Diagnosis not present

## 2022-08-31 DIAGNOSIS — F330 Major depressive disorder, recurrent, mild: Secondary | ICD-10-CM | POA: Diagnosis not present

## 2022-09-07 DIAGNOSIS — N138 Other obstructive and reflux uropathy: Secondary | ICD-10-CM | POA: Diagnosis not present

## 2022-09-07 DIAGNOSIS — N401 Enlarged prostate with lower urinary tract symptoms: Secondary | ICD-10-CM | POA: Diagnosis not present

## 2022-09-07 DIAGNOSIS — R339 Retention of urine, unspecified: Secondary | ICD-10-CM | POA: Diagnosis not present

## 2022-09-09 DIAGNOSIS — F330 Major depressive disorder, recurrent, mild: Secondary | ICD-10-CM | POA: Diagnosis not present

## 2022-09-09 DIAGNOSIS — I110 Hypertensive heart disease with heart failure: Secondary | ICD-10-CM | POA: Diagnosis not present

## 2022-09-09 DIAGNOSIS — R269 Unspecified abnormalities of gait and mobility: Secondary | ICD-10-CM | POA: Diagnosis not present

## 2022-09-09 DIAGNOSIS — S91302D Unspecified open wound, left foot, subsequent encounter: Secondary | ICD-10-CM | POA: Diagnosis not present

## 2022-09-09 DIAGNOSIS — J9611 Chronic respiratory failure with hypoxia: Secondary | ICD-10-CM | POA: Diagnosis not present

## 2022-09-09 DIAGNOSIS — B028 Zoster with other complications: Secondary | ICD-10-CM | POA: Diagnosis not present

## 2022-09-10 DIAGNOSIS — T83098A Other mechanical complication of other indwelling urethral catheter, initial encounter: Secondary | ICD-10-CM | POA: Diagnosis not present

## 2022-09-10 DIAGNOSIS — J9620 Acute and chronic respiratory failure, unspecified whether with hypoxia or hypercapnia: Secondary | ICD-10-CM | POA: Diagnosis not present

## 2022-09-12 DIAGNOSIS — R262 Difficulty in walking, not elsewhere classified: Secondary | ICD-10-CM | POA: Diagnosis not present

## 2022-09-12 DIAGNOSIS — M6281 Muscle weakness (generalized): Secondary | ICD-10-CM | POA: Diagnosis not present

## 2022-09-12 DIAGNOSIS — J9611 Chronic respiratory failure with hypoxia: Secondary | ICD-10-CM | POA: Diagnosis not present

## 2022-09-12 DIAGNOSIS — Z9181 History of falling: Secondary | ICD-10-CM | POA: Diagnosis not present

## 2022-09-12 DIAGNOSIS — L97929 Non-pressure chronic ulcer of unspecified part of left lower leg with unspecified severity: Secondary | ICD-10-CM | POA: Diagnosis not present

## 2022-09-12 DIAGNOSIS — I5030 Unspecified diastolic (congestive) heart failure: Secondary | ICD-10-CM | POA: Diagnosis not present

## 2022-09-12 DIAGNOSIS — L03115 Cellulitis of right lower limb: Secondary | ICD-10-CM | POA: Diagnosis not present

## 2022-09-12 DIAGNOSIS — I87332 Chronic venous hypertension (idiopathic) with ulcer and inflammation of left lower extremity: Secondary | ICD-10-CM | POA: Diagnosis not present

## 2022-09-12 DIAGNOSIS — R2681 Unsteadiness on feet: Secondary | ICD-10-CM | POA: Diagnosis not present

## 2022-09-16 DIAGNOSIS — L309 Dermatitis, unspecified: Secondary | ICD-10-CM | POA: Diagnosis not present

## 2022-09-16 DIAGNOSIS — M85872 Other specified disorders of bone density and structure, left ankle and foot: Secondary | ICD-10-CM | POA: Diagnosis not present

## 2022-09-16 DIAGNOSIS — R0902 Hypoxemia: Secondary | ICD-10-CM | POA: Diagnosis not present

## 2022-09-16 DIAGNOSIS — Z89432 Acquired absence of left foot: Secondary | ICD-10-CM | POA: Diagnosis not present

## 2022-09-16 DIAGNOSIS — I440 Atrioventricular block, first degree: Secondary | ICD-10-CM | POA: Diagnosis not present

## 2022-09-16 DIAGNOSIS — R9431 Abnormal electrocardiogram [ECG] [EKG]: Secondary | ICD-10-CM | POA: Diagnosis not present

## 2022-09-16 DIAGNOSIS — R442 Other hallucinations: Secondary | ICD-10-CM | POA: Diagnosis not present

## 2022-09-16 DIAGNOSIS — R4182 Altered mental status, unspecified: Secondary | ICD-10-CM | POA: Diagnosis not present

## 2022-09-16 DIAGNOSIS — E43 Unspecified severe protein-calorie malnutrition: Secondary | ICD-10-CM | POA: Diagnosis not present

## 2022-09-16 DIAGNOSIS — Z89422 Acquired absence of other left toe(s): Secondary | ICD-10-CM | POA: Diagnosis not present

## 2022-09-16 DIAGNOSIS — R52 Pain, unspecified: Secondary | ICD-10-CM | POA: Diagnosis not present

## 2022-09-16 DIAGNOSIS — I4510 Unspecified right bundle-branch block: Secondary | ICD-10-CM | POA: Diagnosis not present

## 2022-09-16 DIAGNOSIS — R410 Disorientation, unspecified: Secondary | ICD-10-CM | POA: Diagnosis not present

## 2022-09-16 DIAGNOSIS — M86572 Other chronic hematogenous osteomyelitis, left ankle and foot: Secondary | ICD-10-CM | POA: Diagnosis not present

## 2022-09-16 DIAGNOSIS — J9600 Acute respiratory failure, unspecified whether with hypoxia or hypercapnia: Secondary | ICD-10-CM | POA: Diagnosis not present

## 2022-09-16 DIAGNOSIS — J439 Emphysema, unspecified: Secondary | ICD-10-CM | POA: Diagnosis not present

## 2022-09-16 DIAGNOSIS — R918 Other nonspecific abnormal finding of lung field: Secondary | ICD-10-CM | POA: Diagnosis not present

## 2022-09-16 DIAGNOSIS — I509 Heart failure, unspecified: Secondary | ICD-10-CM | POA: Diagnosis not present

## 2022-09-16 DIAGNOSIS — S91302A Unspecified open wound, left foot, initial encounter: Secondary | ICD-10-CM | POA: Diagnosis not present

## 2022-09-16 DIAGNOSIS — D649 Anemia, unspecified: Secondary | ICD-10-CM | POA: Diagnosis not present

## 2022-09-16 DIAGNOSIS — G9389 Other specified disorders of brain: Secondary | ICD-10-CM | POA: Diagnosis not present

## 2022-09-16 DIAGNOSIS — T8744 Infection of amputation stump, left lower extremity: Secondary | ICD-10-CM | POA: Diagnosis not present

## 2022-09-16 DIAGNOSIS — M25511 Pain in right shoulder: Secondary | ICD-10-CM | POA: Diagnosis not present

## 2022-09-16 DIAGNOSIS — M79672 Pain in left foot: Secondary | ICD-10-CM | POA: Diagnosis not present

## 2022-09-16 DIAGNOSIS — J9610 Chronic respiratory failure, unspecified whether with hypoxia or hypercapnia: Secondary | ICD-10-CM | POA: Diagnosis not present

## 2022-09-16 DIAGNOSIS — I5031 Acute diastolic (congestive) heart failure: Secondary | ICD-10-CM | POA: Diagnosis not present

## 2022-09-16 DIAGNOSIS — L03116 Cellulitis of left lower limb: Secondary | ICD-10-CM | POA: Diagnosis not present

## 2022-09-16 DIAGNOSIS — Z6829 Body mass index (BMI) 29.0-29.9, adult: Secondary | ICD-10-CM | POA: Diagnosis not present

## 2022-09-16 DIAGNOSIS — F05 Delirium due to known physiological condition: Secondary | ICD-10-CM | POA: Diagnosis not present

## 2022-09-16 DIAGNOSIS — R000 Tachycardia, unspecified: Secondary | ICD-10-CM | POA: Diagnosis not present

## 2022-09-16 DIAGNOSIS — J810 Acute pulmonary edema: Secondary | ICD-10-CM | POA: Diagnosis not present

## 2022-09-16 DIAGNOSIS — R0602 Shortness of breath: Secondary | ICD-10-CM | POA: Diagnosis not present

## 2022-09-16 DIAGNOSIS — J90 Pleural effusion, not elsewhere classified: Secondary | ICD-10-CM | POA: Diagnosis not present

## 2022-09-16 DIAGNOSIS — D62 Acute posthemorrhagic anemia: Secondary | ICD-10-CM | POA: Diagnosis not present

## 2022-09-16 DIAGNOSIS — E871 Hypo-osmolality and hyponatremia: Secondary | ICD-10-CM | POA: Diagnosis not present

## 2022-09-16 DIAGNOSIS — L120 Bullous pemphigoid: Secondary | ICD-10-CM | POA: Diagnosis not present

## 2022-09-16 DIAGNOSIS — Z66 Do not resuscitate: Secondary | ICD-10-CM | POA: Diagnosis not present

## 2022-09-16 DIAGNOSIS — N400 Enlarged prostate without lower urinary tract symptoms: Secondary | ICD-10-CM | POA: Diagnosis not present

## 2022-09-16 DIAGNOSIS — F0390 Unspecified dementia without behavioral disturbance: Secondary | ICD-10-CM | POA: Diagnosis not present

## 2022-09-16 DIAGNOSIS — G9340 Encephalopathy, unspecified: Secondary | ICD-10-CM | POA: Diagnosis not present

## 2022-09-16 DIAGNOSIS — I110 Hypertensive heart disease with heart failure: Secondary | ICD-10-CM | POA: Diagnosis not present

## 2022-09-16 DIAGNOSIS — F0392 Unspecified dementia, unspecified severity, with psychotic disturbance: Secondary | ICD-10-CM | POA: Diagnosis not present

## 2022-09-16 DIAGNOSIS — R5381 Other malaise: Secondary | ICD-10-CM | POA: Diagnosis not present

## 2022-09-16 DIAGNOSIS — M25521 Pain in right elbow: Secondary | ICD-10-CM | POA: Diagnosis not present

## 2022-09-16 DIAGNOSIS — J9690 Respiratory failure, unspecified, unspecified whether with hypoxia or hypercapnia: Secondary | ICD-10-CM | POA: Diagnosis not present

## 2022-09-16 DIAGNOSIS — G9341 Metabolic encephalopathy: Secondary | ICD-10-CM | POA: Diagnosis not present

## 2022-09-16 DIAGNOSIS — I5043 Acute on chronic combined systolic (congestive) and diastolic (congestive) heart failure: Secondary | ICD-10-CM | POA: Diagnosis not present

## 2022-09-16 DIAGNOSIS — I10 Essential (primary) hypertension: Secondary | ICD-10-CM | POA: Diagnosis not present

## 2022-09-16 DIAGNOSIS — Z872 Personal history of diseases of the skin and subcutaneous tissue: Secondary | ICD-10-CM | POA: Diagnosis not present

## 2022-09-16 DIAGNOSIS — I5032 Chronic diastolic (congestive) heart failure: Secondary | ICD-10-CM | POA: Diagnosis not present

## 2022-09-16 DIAGNOSIS — W268XXA Contact with other sharp object(s), not elsewhere classified, initial encounter: Secondary | ICD-10-CM | POA: Diagnosis not present

## 2022-09-16 DIAGNOSIS — M86172 Other acute osteomyelitis, left ankle and foot: Secondary | ICD-10-CM | POA: Diagnosis not present

## 2022-09-16 DIAGNOSIS — R279 Unspecified lack of coordination: Secondary | ICD-10-CM | POA: Diagnosis not present

## 2022-09-30 DIAGNOSIS — J9620 Acute and chronic respiratory failure, unspecified whether with hypoxia or hypercapnia: Secondary | ICD-10-CM | POA: Diagnosis not present

## 2022-09-30 DIAGNOSIS — N401 Enlarged prostate with lower urinary tract symptoms: Secondary | ICD-10-CM | POA: Diagnosis not present

## 2022-10-01 DIAGNOSIS — B028 Zoster with other complications: Secondary | ICD-10-CM | POA: Diagnosis not present

## 2022-10-01 DIAGNOSIS — I5032 Chronic diastolic (congestive) heart failure: Secondary | ICD-10-CM | POA: Diagnosis not present

## 2022-10-01 DIAGNOSIS — F330 Major depressive disorder, recurrent, mild: Secondary | ICD-10-CM | POA: Diagnosis not present

## 2022-10-01 DIAGNOSIS — S91302D Unspecified open wound, left foot, subsequent encounter: Secondary | ICD-10-CM | POA: Diagnosis not present

## 2022-10-01 DIAGNOSIS — G9340 Encephalopathy, unspecified: Secondary | ICD-10-CM | POA: Diagnosis not present

## 2022-10-01 DIAGNOSIS — I110 Hypertensive heart disease with heart failure: Secondary | ICD-10-CM | POA: Diagnosis not present

## 2022-10-01 DIAGNOSIS — L120 Bullous pemphigoid: Secondary | ICD-10-CM | POA: Diagnosis not present

## 2022-10-01 DIAGNOSIS — R269 Unspecified abnormalities of gait and mobility: Secondary | ICD-10-CM | POA: Diagnosis not present

## 2022-10-07 DIAGNOSIS — S91302D Unspecified open wound, left foot, subsequent encounter: Secondary | ICD-10-CM | POA: Diagnosis not present

## 2022-10-07 DIAGNOSIS — I110 Hypertensive heart disease with heart failure: Secondary | ICD-10-CM | POA: Diagnosis not present

## 2022-10-07 DIAGNOSIS — I5032 Chronic diastolic (congestive) heart failure: Secondary | ICD-10-CM | POA: Diagnosis not present

## 2022-10-07 DIAGNOSIS — M069 Rheumatoid arthritis, unspecified: Secondary | ICD-10-CM | POA: Diagnosis not present

## 2022-10-07 DIAGNOSIS — B028 Zoster with other complications: Secondary | ICD-10-CM | POA: Diagnosis not present

## 2022-10-07 DIAGNOSIS — M1991 Primary osteoarthritis, unspecified site: Secondary | ICD-10-CM | POA: Diagnosis not present

## 2022-10-07 DIAGNOSIS — R269 Unspecified abnormalities of gait and mobility: Secondary | ICD-10-CM | POA: Diagnosis not present

## 2022-10-12 DIAGNOSIS — R2681 Unsteadiness on feet: Secondary | ICD-10-CM | POA: Diagnosis not present

## 2022-10-12 DIAGNOSIS — R262 Difficulty in walking, not elsewhere classified: Secondary | ICD-10-CM | POA: Diagnosis not present

## 2022-10-12 DIAGNOSIS — J9611 Chronic respiratory failure with hypoxia: Secondary | ICD-10-CM | POA: Diagnosis not present

## 2022-10-12 DIAGNOSIS — I5030 Unspecified diastolic (congestive) heart failure: Secondary | ICD-10-CM | POA: Diagnosis not present

## 2022-10-12 DIAGNOSIS — I87332 Chronic venous hypertension (idiopathic) with ulcer and inflammation of left lower extremity: Secondary | ICD-10-CM | POA: Diagnosis not present

## 2022-10-12 DIAGNOSIS — M6281 Muscle weakness (generalized): Secondary | ICD-10-CM | POA: Diagnosis not present

## 2022-10-12 DIAGNOSIS — L03115 Cellulitis of right lower limb: Secondary | ICD-10-CM | POA: Diagnosis not present

## 2022-10-12 DIAGNOSIS — Z9181 History of falling: Secondary | ICD-10-CM | POA: Diagnosis not present

## 2022-10-12 DIAGNOSIS — L97929 Non-pressure chronic ulcer of unspecified part of left lower leg with unspecified severity: Secondary | ICD-10-CM | POA: Diagnosis not present

## 2022-10-20 DIAGNOSIS — R578 Other shock: Secondary | ICD-10-CM | POA: Diagnosis not present

## 2022-10-20 DIAGNOSIS — B9689 Other specified bacterial agents as the cause of diseases classified elsewhere: Secondary | ICD-10-CM | POA: Diagnosis not present

## 2022-10-20 DIAGNOSIS — R9341 Abnormal radiologic findings on diagnostic imaging of renal pelvis, ureter, or bladder: Secondary | ICD-10-CM | POA: Diagnosis not present

## 2022-10-20 DIAGNOSIS — R079 Chest pain, unspecified: Secondary | ICD-10-CM | POA: Diagnosis not present

## 2022-10-20 DIAGNOSIS — B9562 Methicillin resistant Staphylococcus aureus infection as the cause of diseases classified elsewhere: Secondary | ICD-10-CM | POA: Diagnosis not present

## 2022-10-20 DIAGNOSIS — Z7401 Bed confinement status: Secondary | ICD-10-CM | POA: Diagnosis not present

## 2022-10-20 DIAGNOSIS — Z79899 Other long term (current) drug therapy: Secondary | ICD-10-CM | POA: Diagnosis not present

## 2022-10-20 DIAGNOSIS — I10 Essential (primary) hypertension: Secondary | ICD-10-CM | POA: Diagnosis not present

## 2022-10-20 DIAGNOSIS — E872 Acidosis, unspecified: Secondary | ICD-10-CM | POA: Diagnosis not present

## 2022-10-20 DIAGNOSIS — J9601 Acute respiratory failure with hypoxia: Secondary | ICD-10-CM | POA: Diagnosis not present

## 2022-10-20 DIAGNOSIS — R21 Rash and other nonspecific skin eruption: Secondary | ICD-10-CM | POA: Diagnosis not present

## 2022-10-20 DIAGNOSIS — L89152 Pressure ulcer of sacral region, stage 2: Secondary | ICD-10-CM | POA: Diagnosis not present

## 2022-10-20 DIAGNOSIS — M25422 Effusion, left elbow: Secondary | ICD-10-CM | POA: Diagnosis not present

## 2022-10-20 DIAGNOSIS — R000 Tachycardia, unspecified: Secondary | ICD-10-CM | POA: Diagnosis not present

## 2022-10-20 DIAGNOSIS — R54 Age-related physical debility: Secondary | ICD-10-CM | POA: Diagnosis not present

## 2022-10-20 DIAGNOSIS — I5032 Chronic diastolic (congestive) heart failure: Secondary | ICD-10-CM | POA: Diagnosis not present

## 2022-10-20 DIAGNOSIS — M25522 Pain in left elbow: Secondary | ICD-10-CM | POA: Diagnosis not present

## 2022-10-20 DIAGNOSIS — R6521 Severe sepsis with septic shock: Secondary | ICD-10-CM | POA: Diagnosis not present

## 2022-10-20 DIAGNOSIS — Z743 Need for continuous supervision: Secondary | ICD-10-CM | POA: Diagnosis not present

## 2022-10-20 DIAGNOSIS — A4102 Sepsis due to Methicillin resistant Staphylococcus aureus: Secondary | ICD-10-CM | POA: Diagnosis not present

## 2022-10-20 DIAGNOSIS — B961 Klebsiella pneumoniae [K. pneumoniae] as the cause of diseases classified elsewhere: Secondary | ICD-10-CM | POA: Diagnosis not present

## 2022-10-20 DIAGNOSIS — M7989 Other specified soft tissue disorders: Secondary | ICD-10-CM | POA: Diagnosis not present

## 2022-10-20 DIAGNOSIS — Z960 Presence of urogenital implants: Secondary | ICD-10-CM | POA: Diagnosis not present

## 2022-10-20 DIAGNOSIS — R338 Other retention of urine: Secondary | ICD-10-CM | POA: Diagnosis not present

## 2022-10-20 DIAGNOSIS — E871 Hypo-osmolality and hyponatremia: Secondary | ICD-10-CM | POA: Diagnosis not present

## 2022-10-20 DIAGNOSIS — E875 Hyperkalemia: Secondary | ICD-10-CM | POA: Diagnosis not present

## 2022-10-20 DIAGNOSIS — N3000 Acute cystitis without hematuria: Secondary | ICD-10-CM | POA: Diagnosis not present

## 2022-10-20 DIAGNOSIS — R9389 Abnormal findings on diagnostic imaging of other specified body structures: Secondary | ICD-10-CM | POA: Diagnosis not present

## 2022-10-20 DIAGNOSIS — B964 Proteus (mirabilis) (morganii) as the cause of diseases classified elsewhere: Secondary | ICD-10-CM | POA: Diagnosis not present

## 2022-10-20 DIAGNOSIS — N3001 Acute cystitis with hematuria: Secondary | ICD-10-CM | POA: Diagnosis not present

## 2022-10-20 DIAGNOSIS — Z792 Long term (current) use of antibiotics: Secondary | ICD-10-CM | POA: Diagnosis not present

## 2022-10-20 DIAGNOSIS — R531 Weakness: Secondary | ICD-10-CM | POA: Diagnosis not present

## 2022-10-20 DIAGNOSIS — A419 Sepsis, unspecified organism: Secondary | ICD-10-CM | POA: Diagnosis not present

## 2022-10-20 DIAGNOSIS — R339 Retention of urine, unspecified: Secondary | ICD-10-CM | POA: Diagnosis not present

## 2022-10-20 DIAGNOSIS — N390 Urinary tract infection, site not specified: Secondary | ICD-10-CM | POA: Diagnosis not present

## 2022-10-20 DIAGNOSIS — I959 Hypotension, unspecified: Secondary | ICD-10-CM | POA: Diagnosis not present

## 2022-10-20 DIAGNOSIS — N139 Obstructive and reflux uropathy, unspecified: Secondary | ICD-10-CM | POA: Diagnosis not present

## 2022-10-20 DIAGNOSIS — T83511A Infection and inflammatory reaction due to indwelling urethral catheter, initial encounter: Secondary | ICD-10-CM | POA: Diagnosis not present

## 2022-10-20 DIAGNOSIS — R6520 Severe sepsis without septic shock: Secondary | ICD-10-CM | POA: Diagnosis not present

## 2022-10-20 DIAGNOSIS — E8729 Other acidosis: Secondary | ICD-10-CM | POA: Diagnosis not present

## 2022-10-20 DIAGNOSIS — K5900 Constipation, unspecified: Secondary | ICD-10-CM | POA: Diagnosis not present

## 2022-10-20 DIAGNOSIS — R457 State of emotional shock and stress, unspecified: Secondary | ICD-10-CM | POA: Diagnosis not present

## 2022-10-20 DIAGNOSIS — G9341 Metabolic encephalopathy: Secondary | ICD-10-CM | POA: Diagnosis not present

## 2022-10-30 DIAGNOSIS — T83511D Infection and inflammatory reaction due to indwelling urethral catheter, subsequent encounter: Secondary | ICD-10-CM | POA: Diagnosis not present

## 2022-10-30 DIAGNOSIS — S80821D Blister (nonthermal), right lower leg, subsequent encounter: Secondary | ICD-10-CM | POA: Diagnosis not present

## 2022-10-30 DIAGNOSIS — S90822D Blister (nonthermal), left foot, subsequent encounter: Secondary | ICD-10-CM | POA: Diagnosis not present

## 2022-10-30 DIAGNOSIS — L89322 Pressure ulcer of left buttock, stage 2: Secondary | ICD-10-CM | POA: Diagnosis not present

## 2022-10-30 DIAGNOSIS — D7589 Other specified diseases of blood and blood-forming organs: Secondary | ICD-10-CM | POA: Diagnosis not present

## 2022-10-30 DIAGNOSIS — I491 Atrial premature depolarization: Secondary | ICD-10-CM | POA: Diagnosis not present

## 2022-10-30 DIAGNOSIS — I5032 Chronic diastolic (congestive) heart failure: Secondary | ICD-10-CM | POA: Diagnosis not present

## 2022-10-30 DIAGNOSIS — I440 Atrioventricular block, first degree: Secondary | ICD-10-CM | POA: Diagnosis not present

## 2022-10-30 DIAGNOSIS — I110 Hypertensive heart disease with heart failure: Secondary | ICD-10-CM | POA: Diagnosis not present

## 2022-11-02 DIAGNOSIS — L89322 Pressure ulcer of left buttock, stage 2: Secondary | ICD-10-CM | POA: Diagnosis not present

## 2022-11-02 DIAGNOSIS — R339 Retention of urine, unspecified: Secondary | ICD-10-CM | POA: Diagnosis not present

## 2022-11-02 DIAGNOSIS — M1991 Primary osteoarthritis, unspecified site: Secondary | ICD-10-CM | POA: Diagnosis not present

## 2022-11-02 DIAGNOSIS — S90822D Blister (nonthermal), left foot, subsequent encounter: Secondary | ICD-10-CM | POA: Diagnosis not present

## 2022-11-02 DIAGNOSIS — I440 Atrioventricular block, first degree: Secondary | ICD-10-CM | POA: Diagnosis not present

## 2022-11-02 DIAGNOSIS — N400 Enlarged prostate without lower urinary tract symptoms: Secondary | ICD-10-CM | POA: Diagnosis not present

## 2022-11-02 DIAGNOSIS — D7589 Other specified diseases of blood and blood-forming organs: Secondary | ICD-10-CM | POA: Diagnosis not present

## 2022-11-02 DIAGNOSIS — T83511D Infection and inflammatory reaction due to indwelling urethral catheter, subsequent encounter: Secondary | ICD-10-CM | POA: Diagnosis not present

## 2022-11-02 DIAGNOSIS — S80821D Blister (nonthermal), right lower leg, subsequent encounter: Secondary | ICD-10-CM | POA: Diagnosis not present

## 2022-11-02 DIAGNOSIS — I5032 Chronic diastolic (congestive) heart failure: Secondary | ICD-10-CM | POA: Diagnosis not present

## 2022-11-02 DIAGNOSIS — R269 Unspecified abnormalities of gait and mobility: Secondary | ICD-10-CM | POA: Diagnosis not present

## 2022-11-02 DIAGNOSIS — I110 Hypertensive heart disease with heart failure: Secondary | ICD-10-CM | POA: Diagnosis not present

## 2022-11-02 DIAGNOSIS — I491 Atrial premature depolarization: Secondary | ICD-10-CM | POA: Diagnosis not present

## 2022-11-03 DIAGNOSIS — I110 Hypertensive heart disease with heart failure: Secondary | ICD-10-CM | POA: Diagnosis not present

## 2022-11-03 DIAGNOSIS — L89322 Pressure ulcer of left buttock, stage 2: Secondary | ICD-10-CM | POA: Diagnosis not present

## 2022-11-03 DIAGNOSIS — T83511D Infection and inflammatory reaction due to indwelling urethral catheter, subsequent encounter: Secondary | ICD-10-CM | POA: Diagnosis not present

## 2022-11-03 DIAGNOSIS — D7589 Other specified diseases of blood and blood-forming organs: Secondary | ICD-10-CM | POA: Diagnosis not present

## 2022-11-03 DIAGNOSIS — I5032 Chronic diastolic (congestive) heart failure: Secondary | ICD-10-CM | POA: Diagnosis not present

## 2022-11-03 DIAGNOSIS — S90822D Blister (nonthermal), left foot, subsequent encounter: Secondary | ICD-10-CM | POA: Diagnosis not present

## 2022-11-03 DIAGNOSIS — I440 Atrioventricular block, first degree: Secondary | ICD-10-CM | POA: Diagnosis not present

## 2022-11-03 DIAGNOSIS — I491 Atrial premature depolarization: Secondary | ICD-10-CM | POA: Diagnosis not present

## 2022-11-03 DIAGNOSIS — S80821D Blister (nonthermal), right lower leg, subsequent encounter: Secondary | ICD-10-CM | POA: Diagnosis not present

## 2022-11-05 DIAGNOSIS — S90822D Blister (nonthermal), left foot, subsequent encounter: Secondary | ICD-10-CM | POA: Diagnosis not present

## 2022-11-05 DIAGNOSIS — T83511D Infection and inflammatory reaction due to indwelling urethral catheter, subsequent encounter: Secondary | ICD-10-CM | POA: Diagnosis not present

## 2022-11-05 DIAGNOSIS — D7589 Other specified diseases of blood and blood-forming organs: Secondary | ICD-10-CM | POA: Diagnosis not present

## 2022-11-05 DIAGNOSIS — I491 Atrial premature depolarization: Secondary | ICD-10-CM | POA: Diagnosis not present

## 2022-11-05 DIAGNOSIS — M1991 Primary osteoarthritis, unspecified site: Secondary | ICD-10-CM | POA: Diagnosis not present

## 2022-11-05 DIAGNOSIS — S80821D Blister (nonthermal), right lower leg, subsequent encounter: Secondary | ICD-10-CM | POA: Diagnosis not present

## 2022-11-05 DIAGNOSIS — I5032 Chronic diastolic (congestive) heart failure: Secondary | ICD-10-CM | POA: Diagnosis not present

## 2022-11-05 DIAGNOSIS — M069 Rheumatoid arthritis, unspecified: Secondary | ICD-10-CM | POA: Diagnosis not present

## 2022-11-05 DIAGNOSIS — I440 Atrioventricular block, first degree: Secondary | ICD-10-CM | POA: Diagnosis not present

## 2022-11-05 DIAGNOSIS — L89322 Pressure ulcer of left buttock, stage 2: Secondary | ICD-10-CM | POA: Diagnosis not present

## 2022-11-05 DIAGNOSIS — I110 Hypertensive heart disease with heart failure: Secondary | ICD-10-CM | POA: Diagnosis not present

## 2022-11-05 DIAGNOSIS — S91302D Unspecified open wound, left foot, subsequent encounter: Secondary | ICD-10-CM | POA: Diagnosis not present

## 2022-11-05 DIAGNOSIS — R269 Unspecified abnormalities of gait and mobility: Secondary | ICD-10-CM | POA: Diagnosis not present

## 2022-11-06 DIAGNOSIS — I440 Atrioventricular block, first degree: Secondary | ICD-10-CM | POA: Diagnosis not present

## 2022-11-06 DIAGNOSIS — N401 Enlarged prostate with lower urinary tract symptoms: Secondary | ICD-10-CM | POA: Diagnosis not present

## 2022-11-06 DIAGNOSIS — L89322 Pressure ulcer of left buttock, stage 2: Secondary | ICD-10-CM | POA: Diagnosis not present

## 2022-11-06 DIAGNOSIS — D7589 Other specified diseases of blood and blood-forming organs: Secondary | ICD-10-CM | POA: Diagnosis not present

## 2022-11-06 DIAGNOSIS — I110 Hypertensive heart disease with heart failure: Secondary | ICD-10-CM | POA: Diagnosis not present

## 2022-11-06 DIAGNOSIS — S90822D Blister (nonthermal), left foot, subsequent encounter: Secondary | ICD-10-CM | POA: Diagnosis not present

## 2022-11-06 DIAGNOSIS — S80821D Blister (nonthermal), right lower leg, subsequent encounter: Secondary | ICD-10-CM | POA: Diagnosis not present

## 2022-11-06 DIAGNOSIS — T83511D Infection and inflammatory reaction due to indwelling urethral catheter, subsequent encounter: Secondary | ICD-10-CM | POA: Diagnosis not present

## 2022-11-06 DIAGNOSIS — I491 Atrial premature depolarization: Secondary | ICD-10-CM | POA: Diagnosis not present

## 2022-11-06 DIAGNOSIS — I5032 Chronic diastolic (congestive) heart failure: Secondary | ICD-10-CM | POA: Diagnosis not present

## 2022-11-06 DIAGNOSIS — J9620 Acute and chronic respiratory failure, unspecified whether with hypoxia or hypercapnia: Secondary | ICD-10-CM | POA: Diagnosis not present

## 2022-11-09 DIAGNOSIS — N401 Enlarged prostate with lower urinary tract symptoms: Secondary | ICD-10-CM | POA: Diagnosis not present

## 2022-11-09 DIAGNOSIS — Z466 Encounter for fitting and adjustment of urinary device: Secondary | ICD-10-CM | POA: Diagnosis not present

## 2022-11-09 DIAGNOSIS — R338 Other retention of urine: Secondary | ICD-10-CM | POA: Diagnosis not present

## 2022-11-11 DIAGNOSIS — I440 Atrioventricular block, first degree: Secondary | ICD-10-CM | POA: Diagnosis not present

## 2022-11-11 DIAGNOSIS — S90822D Blister (nonthermal), left foot, subsequent encounter: Secondary | ICD-10-CM | POA: Diagnosis not present

## 2022-11-11 DIAGNOSIS — I110 Hypertensive heart disease with heart failure: Secondary | ICD-10-CM | POA: Diagnosis not present

## 2022-11-11 DIAGNOSIS — I491 Atrial premature depolarization: Secondary | ICD-10-CM | POA: Diagnosis not present

## 2022-11-11 DIAGNOSIS — I5032 Chronic diastolic (congestive) heart failure: Secondary | ICD-10-CM | POA: Diagnosis not present

## 2022-11-11 DIAGNOSIS — L89322 Pressure ulcer of left buttock, stage 2: Secondary | ICD-10-CM | POA: Diagnosis not present

## 2022-11-11 DIAGNOSIS — S80821D Blister (nonthermal), right lower leg, subsequent encounter: Secondary | ICD-10-CM | POA: Diagnosis not present

## 2022-11-11 DIAGNOSIS — D7589 Other specified diseases of blood and blood-forming organs: Secondary | ICD-10-CM | POA: Diagnosis not present

## 2022-11-11 DIAGNOSIS — T83511D Infection and inflammatory reaction due to indwelling urethral catheter, subsequent encounter: Secondary | ICD-10-CM | POA: Diagnosis not present

## 2022-11-12 DIAGNOSIS — Z9181 History of falling: Secondary | ICD-10-CM | POA: Diagnosis not present

## 2022-11-12 DIAGNOSIS — S90822D Blister (nonthermal), left foot, subsequent encounter: Secondary | ICD-10-CM | POA: Diagnosis not present

## 2022-11-12 DIAGNOSIS — R262 Difficulty in walking, not elsewhere classified: Secondary | ICD-10-CM | POA: Diagnosis not present

## 2022-11-12 DIAGNOSIS — R2681 Unsteadiness on feet: Secondary | ICD-10-CM | POA: Diagnosis not present

## 2022-11-12 DIAGNOSIS — I5030 Unspecified diastolic (congestive) heart failure: Secondary | ICD-10-CM | POA: Diagnosis not present

## 2022-11-12 DIAGNOSIS — L03115 Cellulitis of right lower limb: Secondary | ICD-10-CM | POA: Diagnosis not present

## 2022-11-12 DIAGNOSIS — D7589 Other specified diseases of blood and blood-forming organs: Secondary | ICD-10-CM | POA: Diagnosis not present

## 2022-11-12 DIAGNOSIS — T83511D Infection and inflammatory reaction due to indwelling urethral catheter, subsequent encounter: Secondary | ICD-10-CM | POA: Diagnosis not present

## 2022-11-12 DIAGNOSIS — I87332 Chronic venous hypertension (idiopathic) with ulcer and inflammation of left lower extremity: Secondary | ICD-10-CM | POA: Diagnosis not present

## 2022-11-12 DIAGNOSIS — L89322 Pressure ulcer of left buttock, stage 2: Secondary | ICD-10-CM | POA: Diagnosis not present

## 2022-11-12 DIAGNOSIS — I5032 Chronic diastolic (congestive) heart failure: Secondary | ICD-10-CM | POA: Diagnosis not present

## 2022-11-12 DIAGNOSIS — I440 Atrioventricular block, first degree: Secondary | ICD-10-CM | POA: Diagnosis not present

## 2022-11-12 DIAGNOSIS — M6281 Muscle weakness (generalized): Secondary | ICD-10-CM | POA: Diagnosis not present

## 2022-11-12 DIAGNOSIS — L97929 Non-pressure chronic ulcer of unspecified part of left lower leg with unspecified severity: Secondary | ICD-10-CM | POA: Diagnosis not present

## 2022-11-12 DIAGNOSIS — S80821D Blister (nonthermal), right lower leg, subsequent encounter: Secondary | ICD-10-CM | POA: Diagnosis not present

## 2022-11-12 DIAGNOSIS — I110 Hypertensive heart disease with heart failure: Secondary | ICD-10-CM | POA: Diagnosis not present

## 2022-11-12 DIAGNOSIS — J9611 Chronic respiratory failure with hypoxia: Secondary | ICD-10-CM | POA: Diagnosis not present

## 2022-11-12 DIAGNOSIS — I491 Atrial premature depolarization: Secondary | ICD-10-CM | POA: Diagnosis not present

## 2022-11-16 DIAGNOSIS — S80821D Blister (nonthermal), right lower leg, subsequent encounter: Secondary | ICD-10-CM | POA: Diagnosis not present

## 2022-11-16 DIAGNOSIS — S90822D Blister (nonthermal), left foot, subsequent encounter: Secondary | ICD-10-CM | POA: Diagnosis not present

## 2022-11-16 DIAGNOSIS — R269 Unspecified abnormalities of gait and mobility: Secondary | ICD-10-CM | POA: Diagnosis not present

## 2022-11-16 DIAGNOSIS — L120 Bullous pemphigoid: Secondary | ICD-10-CM | POA: Diagnosis not present

## 2022-11-16 DIAGNOSIS — I110 Hypertensive heart disease with heart failure: Secondary | ICD-10-CM | POA: Diagnosis not present

## 2022-11-16 DIAGNOSIS — R339 Retention of urine, unspecified: Secondary | ICD-10-CM | POA: Diagnosis not present

## 2022-11-16 DIAGNOSIS — T83511D Infection and inflammatory reaction due to indwelling urethral catheter, subsequent encounter: Secondary | ICD-10-CM | POA: Diagnosis not present

## 2022-11-16 DIAGNOSIS — L89322 Pressure ulcer of left buttock, stage 2: Secondary | ICD-10-CM | POA: Diagnosis not present

## 2022-11-16 DIAGNOSIS — I491 Atrial premature depolarization: Secondary | ICD-10-CM | POA: Diagnosis not present

## 2022-11-16 DIAGNOSIS — I5032 Chronic diastolic (congestive) heart failure: Secondary | ICD-10-CM | POA: Diagnosis not present

## 2022-11-16 DIAGNOSIS — D7589 Other specified diseases of blood and blood-forming organs: Secondary | ICD-10-CM | POA: Diagnosis not present

## 2022-11-16 DIAGNOSIS — I440 Atrioventricular block, first degree: Secondary | ICD-10-CM | POA: Diagnosis not present

## 2022-11-18 DIAGNOSIS — I491 Atrial premature depolarization: Secondary | ICD-10-CM | POA: Diagnosis not present

## 2022-11-18 DIAGNOSIS — D7589 Other specified diseases of blood and blood-forming organs: Secondary | ICD-10-CM | POA: Diagnosis not present

## 2022-11-18 DIAGNOSIS — I5032 Chronic diastolic (congestive) heart failure: Secondary | ICD-10-CM | POA: Diagnosis not present

## 2022-11-18 DIAGNOSIS — I110 Hypertensive heart disease with heart failure: Secondary | ICD-10-CM | POA: Diagnosis not present

## 2022-11-18 DIAGNOSIS — S90822D Blister (nonthermal), left foot, subsequent encounter: Secondary | ICD-10-CM | POA: Diagnosis not present

## 2022-11-18 DIAGNOSIS — T83511D Infection and inflammatory reaction due to indwelling urethral catheter, subsequent encounter: Secondary | ICD-10-CM | POA: Diagnosis not present

## 2022-11-18 DIAGNOSIS — S80821D Blister (nonthermal), right lower leg, subsequent encounter: Secondary | ICD-10-CM | POA: Diagnosis not present

## 2022-11-18 DIAGNOSIS — L89322 Pressure ulcer of left buttock, stage 2: Secondary | ICD-10-CM | POA: Diagnosis not present

## 2022-11-18 DIAGNOSIS — I440 Atrioventricular block, first degree: Secondary | ICD-10-CM | POA: Diagnosis not present

## 2022-11-20 DIAGNOSIS — I491 Atrial premature depolarization: Secondary | ICD-10-CM | POA: Diagnosis not present

## 2022-11-20 DIAGNOSIS — S90822D Blister (nonthermal), left foot, subsequent encounter: Secondary | ICD-10-CM | POA: Diagnosis not present

## 2022-11-20 DIAGNOSIS — S80821D Blister (nonthermal), right lower leg, subsequent encounter: Secondary | ICD-10-CM | POA: Diagnosis not present

## 2022-11-20 DIAGNOSIS — D7589 Other specified diseases of blood and blood-forming organs: Secondary | ICD-10-CM | POA: Diagnosis not present

## 2022-11-20 DIAGNOSIS — I440 Atrioventricular block, first degree: Secondary | ICD-10-CM | POA: Diagnosis not present

## 2022-11-20 DIAGNOSIS — I5032 Chronic diastolic (congestive) heart failure: Secondary | ICD-10-CM | POA: Diagnosis not present

## 2022-11-20 DIAGNOSIS — L89322 Pressure ulcer of left buttock, stage 2: Secondary | ICD-10-CM | POA: Diagnosis not present

## 2022-11-20 DIAGNOSIS — T83511D Infection and inflammatory reaction due to indwelling urethral catheter, subsequent encounter: Secondary | ICD-10-CM | POA: Diagnosis not present

## 2022-11-20 DIAGNOSIS — I110 Hypertensive heart disease with heart failure: Secondary | ICD-10-CM | POA: Diagnosis not present

## 2022-11-23 DIAGNOSIS — S90822D Blister (nonthermal), left foot, subsequent encounter: Secondary | ICD-10-CM | POA: Diagnosis not present

## 2022-11-23 DIAGNOSIS — T83511D Infection and inflammatory reaction due to indwelling urethral catheter, subsequent encounter: Secondary | ICD-10-CM | POA: Diagnosis not present

## 2022-11-23 DIAGNOSIS — L89322 Pressure ulcer of left buttock, stage 2: Secondary | ICD-10-CM | POA: Diagnosis not present

## 2022-11-23 DIAGNOSIS — I110 Hypertensive heart disease with heart failure: Secondary | ICD-10-CM | POA: Diagnosis not present

## 2022-11-23 DIAGNOSIS — D7589 Other specified diseases of blood and blood-forming organs: Secondary | ICD-10-CM | POA: Diagnosis not present

## 2022-11-23 DIAGNOSIS — S80821D Blister (nonthermal), right lower leg, subsequent encounter: Secondary | ICD-10-CM | POA: Diagnosis not present

## 2022-11-23 DIAGNOSIS — I5032 Chronic diastolic (congestive) heart failure: Secondary | ICD-10-CM | POA: Diagnosis not present

## 2022-11-23 DIAGNOSIS — I491 Atrial premature depolarization: Secondary | ICD-10-CM | POA: Diagnosis not present

## 2022-11-23 DIAGNOSIS — I440 Atrioventricular block, first degree: Secondary | ICD-10-CM | POA: Diagnosis not present

## 2022-11-25 DIAGNOSIS — I440 Atrioventricular block, first degree: Secondary | ICD-10-CM | POA: Diagnosis not present

## 2022-11-25 DIAGNOSIS — I110 Hypertensive heart disease with heart failure: Secondary | ICD-10-CM | POA: Diagnosis not present

## 2022-11-25 DIAGNOSIS — I491 Atrial premature depolarization: Secondary | ICD-10-CM | POA: Diagnosis not present

## 2022-11-25 DIAGNOSIS — S80821D Blister (nonthermal), right lower leg, subsequent encounter: Secondary | ICD-10-CM | POA: Diagnosis not present

## 2022-11-25 DIAGNOSIS — T83511D Infection and inflammatory reaction due to indwelling urethral catheter, subsequent encounter: Secondary | ICD-10-CM | POA: Diagnosis not present

## 2022-11-25 DIAGNOSIS — D7589 Other specified diseases of blood and blood-forming organs: Secondary | ICD-10-CM | POA: Diagnosis not present

## 2022-11-25 DIAGNOSIS — S90822D Blister (nonthermal), left foot, subsequent encounter: Secondary | ICD-10-CM | POA: Diagnosis not present

## 2022-11-25 DIAGNOSIS — I5032 Chronic diastolic (congestive) heart failure: Secondary | ICD-10-CM | POA: Diagnosis not present

## 2022-11-25 DIAGNOSIS — L89322 Pressure ulcer of left buttock, stage 2: Secondary | ICD-10-CM | POA: Diagnosis not present

## 2022-11-26 DIAGNOSIS — F330 Major depressive disorder, recurrent, mild: Secondary | ICD-10-CM | POA: Diagnosis not present

## 2022-11-26 DIAGNOSIS — R269 Unspecified abnormalities of gait and mobility: Secondary | ICD-10-CM | POA: Diagnosis not present

## 2022-11-26 DIAGNOSIS — I5032 Chronic diastolic (congestive) heart failure: Secondary | ICD-10-CM | POA: Diagnosis not present

## 2022-11-26 DIAGNOSIS — I110 Hypertensive heart disease with heart failure: Secondary | ICD-10-CM | POA: Diagnosis not present

## 2022-11-26 DIAGNOSIS — L120 Bullous pemphigoid: Secondary | ICD-10-CM | POA: Diagnosis not present

## 2022-11-27 DIAGNOSIS — L89322 Pressure ulcer of left buttock, stage 2: Secondary | ICD-10-CM | POA: Diagnosis not present

## 2022-11-27 DIAGNOSIS — D7589 Other specified diseases of blood and blood-forming organs: Secondary | ICD-10-CM | POA: Diagnosis not present

## 2022-11-27 DIAGNOSIS — S90822D Blister (nonthermal), left foot, subsequent encounter: Secondary | ICD-10-CM | POA: Diagnosis not present

## 2022-11-27 DIAGNOSIS — T83511D Infection and inflammatory reaction due to indwelling urethral catheter, subsequent encounter: Secondary | ICD-10-CM | POA: Diagnosis not present

## 2022-11-27 DIAGNOSIS — I440 Atrioventricular block, first degree: Secondary | ICD-10-CM | POA: Diagnosis not present

## 2022-11-27 DIAGNOSIS — I110 Hypertensive heart disease with heart failure: Secondary | ICD-10-CM | POA: Diagnosis not present

## 2022-11-27 DIAGNOSIS — S80821D Blister (nonthermal), right lower leg, subsequent encounter: Secondary | ICD-10-CM | POA: Diagnosis not present

## 2022-11-27 DIAGNOSIS — I491 Atrial premature depolarization: Secondary | ICD-10-CM | POA: Diagnosis not present

## 2022-11-27 DIAGNOSIS — I5032 Chronic diastolic (congestive) heart failure: Secondary | ICD-10-CM | POA: Diagnosis not present

## 2022-11-30 DIAGNOSIS — N401 Enlarged prostate with lower urinary tract symptoms: Secondary | ICD-10-CM | POA: Diagnosis not present

## 2022-11-30 DIAGNOSIS — I110 Hypertensive heart disease with heart failure: Secondary | ICD-10-CM | POA: Diagnosis not present

## 2022-11-30 DIAGNOSIS — D7589 Other specified diseases of blood and blood-forming organs: Secondary | ICD-10-CM | POA: Diagnosis not present

## 2022-11-30 DIAGNOSIS — R338 Other retention of urine: Secondary | ICD-10-CM | POA: Diagnosis not present

## 2022-11-30 DIAGNOSIS — I5032 Chronic diastolic (congestive) heart failure: Secondary | ICD-10-CM | POA: Diagnosis not present

## 2022-11-30 DIAGNOSIS — T83511D Infection and inflammatory reaction due to indwelling urethral catheter, subsequent encounter: Secondary | ICD-10-CM | POA: Diagnosis not present

## 2022-11-30 DIAGNOSIS — L89322 Pressure ulcer of left buttock, stage 2: Secondary | ICD-10-CM | POA: Diagnosis not present

## 2022-11-30 DIAGNOSIS — I491 Atrial premature depolarization: Secondary | ICD-10-CM | POA: Diagnosis not present

## 2022-11-30 DIAGNOSIS — S90822D Blister (nonthermal), left foot, subsequent encounter: Secondary | ICD-10-CM | POA: Diagnosis not present

## 2022-11-30 DIAGNOSIS — S80821D Blister (nonthermal), right lower leg, subsequent encounter: Secondary | ICD-10-CM | POA: Diagnosis not present

## 2022-11-30 DIAGNOSIS — I440 Atrioventricular block, first degree: Secondary | ICD-10-CM | POA: Diagnosis not present

## 2022-11-30 DIAGNOSIS — R339 Retention of urine, unspecified: Secondary | ICD-10-CM | POA: Diagnosis not present

## 2022-12-02 DIAGNOSIS — D7589 Other specified diseases of blood and blood-forming organs: Secondary | ICD-10-CM | POA: Diagnosis not present

## 2022-12-02 DIAGNOSIS — I5032 Chronic diastolic (congestive) heart failure: Secondary | ICD-10-CM | POA: Diagnosis not present

## 2022-12-02 DIAGNOSIS — L0390 Cellulitis, unspecified: Secondary | ICD-10-CM | POA: Diagnosis not present

## 2022-12-02 DIAGNOSIS — I110 Hypertensive heart disease with heart failure: Secondary | ICD-10-CM | POA: Diagnosis not present

## 2022-12-02 DIAGNOSIS — L120 Bullous pemphigoid: Secondary | ICD-10-CM | POA: Diagnosis not present

## 2022-12-02 DIAGNOSIS — I491 Atrial premature depolarization: Secondary | ICD-10-CM | POA: Diagnosis not present

## 2022-12-02 DIAGNOSIS — T83511D Infection and inflammatory reaction due to indwelling urethral catheter, subsequent encounter: Secondary | ICD-10-CM | POA: Diagnosis not present

## 2022-12-02 DIAGNOSIS — L89322 Pressure ulcer of left buttock, stage 2: Secondary | ICD-10-CM | POA: Diagnosis not present

## 2022-12-02 DIAGNOSIS — S80821D Blister (nonthermal), right lower leg, subsequent encounter: Secondary | ICD-10-CM | POA: Diagnosis not present

## 2022-12-02 DIAGNOSIS — I440 Atrioventricular block, first degree: Secondary | ICD-10-CM | POA: Diagnosis not present

## 2022-12-02 DIAGNOSIS — S90822D Blister (nonthermal), left foot, subsequent encounter: Secondary | ICD-10-CM | POA: Diagnosis not present

## 2022-12-03 DIAGNOSIS — R339 Retention of urine, unspecified: Secondary | ICD-10-CM | POA: Diagnosis not present

## 2022-12-03 DIAGNOSIS — I5032 Chronic diastolic (congestive) heart failure: Secondary | ICD-10-CM | POA: Diagnosis not present

## 2022-12-03 DIAGNOSIS — R269 Unspecified abnormalities of gait and mobility: Secondary | ICD-10-CM | POA: Diagnosis not present

## 2022-12-03 DIAGNOSIS — N3000 Acute cystitis without hematuria: Secondary | ICD-10-CM | POA: Diagnosis not present

## 2022-12-03 DIAGNOSIS — Z1612 Extended spectrum beta lactamase (ESBL) resistance: Secondary | ICD-10-CM | POA: Diagnosis not present

## 2022-12-03 DIAGNOSIS — I110 Hypertensive heart disease with heart failure: Secondary | ICD-10-CM | POA: Diagnosis not present

## 2022-12-03 DIAGNOSIS — L120 Bullous pemphigoid: Secondary | ICD-10-CM | POA: Diagnosis not present

## 2022-12-04 DIAGNOSIS — I491 Atrial premature depolarization: Secondary | ICD-10-CM | POA: Diagnosis not present

## 2022-12-04 DIAGNOSIS — I440 Atrioventricular block, first degree: Secondary | ICD-10-CM | POA: Diagnosis not present

## 2022-12-04 DIAGNOSIS — S90822D Blister (nonthermal), left foot, subsequent encounter: Secondary | ICD-10-CM | POA: Diagnosis not present

## 2022-12-04 DIAGNOSIS — D7589 Other specified diseases of blood and blood-forming organs: Secondary | ICD-10-CM | POA: Diagnosis not present

## 2022-12-04 DIAGNOSIS — I110 Hypertensive heart disease with heart failure: Secondary | ICD-10-CM | POA: Diagnosis not present

## 2022-12-04 DIAGNOSIS — L89322 Pressure ulcer of left buttock, stage 2: Secondary | ICD-10-CM | POA: Diagnosis not present

## 2022-12-04 DIAGNOSIS — T83511D Infection and inflammatory reaction due to indwelling urethral catheter, subsequent encounter: Secondary | ICD-10-CM | POA: Diagnosis not present

## 2022-12-04 DIAGNOSIS — I5032 Chronic diastolic (congestive) heart failure: Secondary | ICD-10-CM | POA: Diagnosis not present

## 2022-12-04 DIAGNOSIS — S80821D Blister (nonthermal), right lower leg, subsequent encounter: Secondary | ICD-10-CM | POA: Diagnosis not present

## 2022-12-07 DIAGNOSIS — I5032 Chronic diastolic (congestive) heart failure: Secondary | ICD-10-CM | POA: Diagnosis not present

## 2022-12-07 DIAGNOSIS — S80821D Blister (nonthermal), right lower leg, subsequent encounter: Secondary | ICD-10-CM | POA: Diagnosis not present

## 2022-12-07 DIAGNOSIS — S90822D Blister (nonthermal), left foot, subsequent encounter: Secondary | ICD-10-CM | POA: Diagnosis not present

## 2022-12-07 DIAGNOSIS — I491 Atrial premature depolarization: Secondary | ICD-10-CM | POA: Diagnosis not present

## 2022-12-07 DIAGNOSIS — I110 Hypertensive heart disease with heart failure: Secondary | ICD-10-CM | POA: Diagnosis not present

## 2022-12-07 DIAGNOSIS — D7589 Other specified diseases of blood and blood-forming organs: Secondary | ICD-10-CM | POA: Diagnosis not present

## 2022-12-07 DIAGNOSIS — T83511D Infection and inflammatory reaction due to indwelling urethral catheter, subsequent encounter: Secondary | ICD-10-CM | POA: Diagnosis not present

## 2022-12-07 DIAGNOSIS — I440 Atrioventricular block, first degree: Secondary | ICD-10-CM | POA: Diagnosis not present

## 2022-12-07 DIAGNOSIS — L89322 Pressure ulcer of left buttock, stage 2: Secondary | ICD-10-CM | POA: Diagnosis not present

## 2022-12-09 DIAGNOSIS — R269 Unspecified abnormalities of gait and mobility: Secondary | ICD-10-CM | POA: Diagnosis not present

## 2022-12-09 DIAGNOSIS — L120 Bullous pemphigoid: Secondary | ICD-10-CM | POA: Diagnosis not present

## 2022-12-09 DIAGNOSIS — M069 Rheumatoid arthritis, unspecified: Secondary | ICD-10-CM | POA: Diagnosis not present

## 2022-12-09 DIAGNOSIS — I110 Hypertensive heart disease with heart failure: Secondary | ICD-10-CM | POA: Diagnosis not present

## 2022-12-09 DIAGNOSIS — J9611 Chronic respiratory failure with hypoxia: Secondary | ICD-10-CM | POA: Diagnosis not present

## 2022-12-09 DIAGNOSIS — I87311 Chronic venous hypertension (idiopathic) with ulcer of right lower extremity: Secondary | ICD-10-CM | POA: Diagnosis not present

## 2022-12-09 DIAGNOSIS — S80821D Blister (nonthermal), right lower leg, subsequent encounter: Secondary | ICD-10-CM | POA: Diagnosis not present

## 2022-12-09 DIAGNOSIS — M1991 Primary osteoarthritis, unspecified site: Secondary | ICD-10-CM | POA: Diagnosis not present

## 2022-12-11 DIAGNOSIS — L89322 Pressure ulcer of left buttock, stage 2: Secondary | ICD-10-CM | POA: Diagnosis not present

## 2022-12-11 DIAGNOSIS — S80821D Blister (nonthermal), right lower leg, subsequent encounter: Secondary | ICD-10-CM | POA: Diagnosis not present

## 2022-12-11 DIAGNOSIS — I491 Atrial premature depolarization: Secondary | ICD-10-CM | POA: Diagnosis not present

## 2022-12-11 DIAGNOSIS — S90822D Blister (nonthermal), left foot, subsequent encounter: Secondary | ICD-10-CM | POA: Diagnosis not present

## 2022-12-11 DIAGNOSIS — D7589 Other specified diseases of blood and blood-forming organs: Secondary | ICD-10-CM | POA: Diagnosis not present

## 2022-12-11 DIAGNOSIS — I110 Hypertensive heart disease with heart failure: Secondary | ICD-10-CM | POA: Diagnosis not present

## 2022-12-11 DIAGNOSIS — I440 Atrioventricular block, first degree: Secondary | ICD-10-CM | POA: Diagnosis not present

## 2022-12-11 DIAGNOSIS — T83511D Infection and inflammatory reaction due to indwelling urethral catheter, subsequent encounter: Secondary | ICD-10-CM | POA: Diagnosis not present

## 2022-12-11 DIAGNOSIS — I5032 Chronic diastolic (congestive) heart failure: Secondary | ICD-10-CM | POA: Diagnosis not present

## 2022-12-12 DIAGNOSIS — L03115 Cellulitis of right lower limb: Secondary | ICD-10-CM | POA: Diagnosis not present

## 2022-12-12 DIAGNOSIS — L97929 Non-pressure chronic ulcer of unspecified part of left lower leg with unspecified severity: Secondary | ICD-10-CM | POA: Diagnosis not present

## 2022-12-12 DIAGNOSIS — R262 Difficulty in walking, not elsewhere classified: Secondary | ICD-10-CM | POA: Diagnosis not present

## 2022-12-12 DIAGNOSIS — J9611 Chronic respiratory failure with hypoxia: Secondary | ICD-10-CM | POA: Diagnosis not present

## 2022-12-12 DIAGNOSIS — R2681 Unsteadiness on feet: Secondary | ICD-10-CM | POA: Diagnosis not present

## 2022-12-12 DIAGNOSIS — I5030 Unspecified diastolic (congestive) heart failure: Secondary | ICD-10-CM | POA: Diagnosis not present

## 2022-12-12 DIAGNOSIS — M6281 Muscle weakness (generalized): Secondary | ICD-10-CM | POA: Diagnosis not present

## 2022-12-12 DIAGNOSIS — I87332 Chronic venous hypertension (idiopathic) with ulcer and inflammation of left lower extremity: Secondary | ICD-10-CM | POA: Diagnosis not present

## 2022-12-12 DIAGNOSIS — Z9181 History of falling: Secondary | ICD-10-CM | POA: Diagnosis not present

## 2022-12-14 DIAGNOSIS — I440 Atrioventricular block, first degree: Secondary | ICD-10-CM | POA: Diagnosis not present

## 2022-12-14 DIAGNOSIS — D7589 Other specified diseases of blood and blood-forming organs: Secondary | ICD-10-CM | POA: Diagnosis not present

## 2022-12-14 DIAGNOSIS — I5032 Chronic diastolic (congestive) heart failure: Secondary | ICD-10-CM | POA: Diagnosis not present

## 2022-12-14 DIAGNOSIS — I491 Atrial premature depolarization: Secondary | ICD-10-CM | POA: Diagnosis not present

## 2022-12-14 DIAGNOSIS — S90822D Blister (nonthermal), left foot, subsequent encounter: Secondary | ICD-10-CM | POA: Diagnosis not present

## 2022-12-14 DIAGNOSIS — I110 Hypertensive heart disease with heart failure: Secondary | ICD-10-CM | POA: Diagnosis not present

## 2022-12-14 DIAGNOSIS — S80821D Blister (nonthermal), right lower leg, subsequent encounter: Secondary | ICD-10-CM | POA: Diagnosis not present

## 2022-12-14 DIAGNOSIS — T83511D Infection and inflammatory reaction due to indwelling urethral catheter, subsequent encounter: Secondary | ICD-10-CM | POA: Diagnosis not present

## 2022-12-14 DIAGNOSIS — L89322 Pressure ulcer of left buttock, stage 2: Secondary | ICD-10-CM | POA: Diagnosis not present

## 2022-12-19 DIAGNOSIS — S80821D Blister (nonthermal), right lower leg, subsequent encounter: Secondary | ICD-10-CM | POA: Diagnosis not present

## 2022-12-19 DIAGNOSIS — L89322 Pressure ulcer of left buttock, stage 2: Secondary | ICD-10-CM | POA: Diagnosis not present

## 2022-12-19 DIAGNOSIS — I110 Hypertensive heart disease with heart failure: Secondary | ICD-10-CM | POA: Diagnosis not present

## 2022-12-19 DIAGNOSIS — I440 Atrioventricular block, first degree: Secondary | ICD-10-CM | POA: Diagnosis not present

## 2022-12-19 DIAGNOSIS — S90822D Blister (nonthermal), left foot, subsequent encounter: Secondary | ICD-10-CM | POA: Diagnosis not present

## 2022-12-19 DIAGNOSIS — T83511D Infection and inflammatory reaction due to indwelling urethral catheter, subsequent encounter: Secondary | ICD-10-CM | POA: Diagnosis not present

## 2022-12-19 DIAGNOSIS — D7589 Other specified diseases of blood and blood-forming organs: Secondary | ICD-10-CM | POA: Diagnosis not present

## 2022-12-19 DIAGNOSIS — I491 Atrial premature depolarization: Secondary | ICD-10-CM | POA: Diagnosis not present

## 2022-12-19 DIAGNOSIS — I5032 Chronic diastolic (congestive) heart failure: Secondary | ICD-10-CM | POA: Diagnosis not present

## 2022-12-23 DIAGNOSIS — I5032 Chronic diastolic (congestive) heart failure: Secondary | ICD-10-CM | POA: Diagnosis not present

## 2022-12-23 DIAGNOSIS — R269 Unspecified abnormalities of gait and mobility: Secondary | ICD-10-CM | POA: Diagnosis not present

## 2022-12-23 DIAGNOSIS — I110 Hypertensive heart disease with heart failure: Secondary | ICD-10-CM | POA: Diagnosis not present

## 2022-12-23 DIAGNOSIS — F330 Major depressive disorder, recurrent, mild: Secondary | ICD-10-CM | POA: Diagnosis not present

## 2022-12-23 DIAGNOSIS — R627 Adult failure to thrive: Secondary | ICD-10-CM | POA: Diagnosis not present

## 2022-12-23 DIAGNOSIS — L120 Bullous pemphigoid: Secondary | ICD-10-CM | POA: Diagnosis not present

## 2022-12-23 DIAGNOSIS — M069 Rheumatoid arthritis, unspecified: Secondary | ICD-10-CM | POA: Diagnosis not present

## 2022-12-30 DIAGNOSIS — L120 Bullous pemphigoid: Secondary | ICD-10-CM | POA: Diagnosis not present

## 2022-12-30 DIAGNOSIS — M069 Rheumatoid arthritis, unspecified: Secondary | ICD-10-CM | POA: Diagnosis not present

## 2022-12-30 DIAGNOSIS — R269 Unspecified abnormalities of gait and mobility: Secondary | ICD-10-CM | POA: Diagnosis not present

## 2022-12-30 DIAGNOSIS — I110 Hypertensive heart disease with heart failure: Secondary | ICD-10-CM | POA: Diagnosis not present

## 2023-01-06 DIAGNOSIS — F330 Major depressive disorder, recurrent, mild: Secondary | ICD-10-CM | POA: Diagnosis not present

## 2023-01-06 DIAGNOSIS — L120 Bullous pemphigoid: Secondary | ICD-10-CM | POA: Diagnosis not present

## 2023-01-06 DIAGNOSIS — R339 Retention of urine, unspecified: Secondary | ICD-10-CM | POA: Diagnosis not present

## 2023-01-06 DIAGNOSIS — I5032 Chronic diastolic (congestive) heart failure: Secondary | ICD-10-CM | POA: Diagnosis not present

## 2023-01-06 DIAGNOSIS — R269 Unspecified abnormalities of gait and mobility: Secondary | ICD-10-CM | POA: Diagnosis not present

## 2023-01-06 DIAGNOSIS — I110 Hypertensive heart disease with heart failure: Secondary | ICD-10-CM | POA: Diagnosis not present

## 2023-01-19 DIAGNOSIS — R339 Retention of urine, unspecified: Secondary | ICD-10-CM | POA: Diagnosis not present

## 2023-01-19 DIAGNOSIS — N390 Urinary tract infection, site not specified: Secondary | ICD-10-CM | POA: Diagnosis not present
